# Patient Record
Sex: Male | Born: 1960
Health system: Southern US, Community
[De-identification: ages and names within clinical notes are randomized; demographics above are authoritative.]

## PROBLEM LIST (undated history)

## (undated) DIAGNOSIS — F419 Anxiety disorder, unspecified: Secondary | ICD-10-CM

## (undated) DIAGNOSIS — J45909 Unspecified asthma, uncomplicated: Secondary | ICD-10-CM

## (undated) DIAGNOSIS — Z9289 Personal history of other medical treatment: Secondary | ICD-10-CM

## (undated) DIAGNOSIS — IMO0002 Reserved for concepts with insufficient information to code with codable children: Secondary | ICD-10-CM

## (undated) DIAGNOSIS — I10 Essential (primary) hypertension: Secondary | ICD-10-CM

## (undated) DIAGNOSIS — K219 Gastro-esophageal reflux disease without esophagitis: Secondary | ICD-10-CM

## (undated) DIAGNOSIS — R569 Unspecified convulsions: Secondary | ICD-10-CM

## (undated) DIAGNOSIS — T783XXA Angioneurotic edema, initial encounter: Secondary | ICD-10-CM

## (undated) HISTORY — DX: Unspecified convulsions: R56.9

## (undated) HISTORY — DX: Unspecified asthma, uncomplicated: J45.909

## (undated) HISTORY — DX: Essential (primary) hypertension: I10

## (undated) HISTORY — DX: Angioneurotic edema, initial encounter: T78.3XXA

## (undated) HISTORY — PX: OTHER SURGICAL HISTORY: SHX169

---

## 1997-10-27 ENCOUNTER — Emergency Department (HOSPITAL_COMMUNITY): Admission: AD | Admit: 1997-10-27 | Discharge: 1997-10-27 | Payer: Self-pay | Admitting: Emergency Medicine

## 2006-07-01 ENCOUNTER — Emergency Department (HOSPITAL_COMMUNITY): Admission: EM | Admit: 2006-07-01 | Discharge: 2006-07-01 | Payer: Self-pay | Admitting: Emergency Medicine

## 2008-02-07 ENCOUNTER — Ambulatory Visit: Payer: Self-pay | Admitting: Gastroenterology

## 2008-02-07 ENCOUNTER — Ambulatory Visit: Payer: Self-pay | Admitting: Pulmonary Disease

## 2008-02-07 ENCOUNTER — Inpatient Hospital Stay (HOSPITAL_COMMUNITY): Admission: EM | Admit: 2008-02-07 | Discharge: 2008-02-09 | Payer: Self-pay | Admitting: Emergency Medicine

## 2008-02-08 ENCOUNTER — Encounter: Payer: Self-pay | Admitting: Gastroenterology

## 2008-04-28 ENCOUNTER — Ambulatory Visit: Payer: Self-pay | Admitting: Nurse Practitioner

## 2008-04-28 DIAGNOSIS — K029 Dental caries, unspecified: Secondary | ICD-10-CM

## 2008-04-28 DIAGNOSIS — K219 Gastro-esophageal reflux disease without esophagitis: Secondary | ICD-10-CM | POA: Insufficient documentation

## 2008-04-28 DIAGNOSIS — F1910 Other psychoactive substance abuse, uncomplicated: Secondary | ICD-10-CM | POA: Insufficient documentation

## 2008-04-28 DIAGNOSIS — D649 Anemia, unspecified: Secondary | ICD-10-CM | POA: Insufficient documentation

## 2008-04-28 DIAGNOSIS — F1010 Alcohol abuse, uncomplicated: Secondary | ICD-10-CM | POA: Insufficient documentation

## 2008-04-28 DIAGNOSIS — F17200 Nicotine dependence, unspecified, uncomplicated: Secondary | ICD-10-CM | POA: Insufficient documentation

## 2008-04-28 DIAGNOSIS — K279 Peptic ulcer, site unspecified, unspecified as acute or chronic, without hemorrhage or perforation: Secondary | ICD-10-CM | POA: Insufficient documentation

## 2008-05-02 LAB — CONVERTED CEMR LAB
Eosinophils Absolute: 0.1 10*3/uL (ref 0.0–0.7)
HCT: 40.5 % (ref 39.0–52.0)
Hemoglobin: 12.4 g/dL — ABNORMAL LOW (ref 13.0–17.0)
Lymphocytes Relative: 34 % (ref 12–46)
Lymphs Abs: 1.2 10*3/uL (ref 0.7–4.0)
MCV: 76.9 fL — ABNORMAL LOW (ref 78.0–100.0)
Monocytes Relative: 14 % — ABNORMAL HIGH (ref 3–12)
Neutrophils Relative %: 49 % (ref 43–77)
Platelets: 298 10*3/uL (ref 150–400)
RBC: 5.27 M/uL (ref 4.22–5.81)
Retic Ct Pct: 0.5 % (ref 0.4–3.1)

## 2008-06-15 ENCOUNTER — Ambulatory Visit: Payer: Self-pay | Admitting: *Deleted

## 2008-11-04 ENCOUNTER — Emergency Department (HOSPITAL_COMMUNITY): Admission: EM | Admit: 2008-11-04 | Discharge: 2008-11-05 | Payer: Self-pay | Admitting: Emergency Medicine

## 2010-01-02 ENCOUNTER — Emergency Department (HOSPITAL_COMMUNITY): Admission: EM | Admit: 2010-01-02 | Discharge: 2010-01-02 | Payer: Self-pay | Admitting: Emergency Medicine

## 2010-10-23 NOTE — Discharge Summary (Signed)
Casey Warren, Casey Warren                ACCOUNT NO.:  [id number]   MEDICAL RECORD NO.:  [id number]          PATIENT TYPE:  INP   LOCATION:  3005                         FACILITY:  [facility]   PHYSICIAN:  Renee Ramus, MD       DATE OF BIRTH:  03/23/1961   DATE OF ADMISSION:  02/06/2008  DATE OF DISCHARGE:  02/09/2008                               DISCHARGE SUMMARY   PRIMARY DIAGNOSIS:  Gastrointestinal bleed.   SECONDARY DIAGNOSES:  1. Profound anemia.  2. Helicobacter pylori positive.  3. Polysubstance abuse.   HOSPITAL COURSE:  1. GI bleed.  The patient is a 50 year old African American male who      was admitted secondary to increased weakness.  The patient was      found to be profoundly anemic.  He was admitted and placed in      intensive care unit.  There, he received an urgent consult from GI      who performed an EGD showing gastropathy.  The patient did undergo      biopsy, which tested positive on a CLO test.  The patient has been      diagnosed with Helicobacter pylori infection and started a 7-day      treatment regimen.  2. Anemia.  The patient is anemic from GI bleed.  Iron studies are      currently pending, so we have started him empirically on p.o. iron      as part of his recovery.  The patient was transfused 4 units packed      RBCs with good response and his hemoglobins remained stable.  3. History of  substance abuse.  The patient did test positive on tox      screen for cocaine, but he has had no problems with withdrawal      during this hospitalization.   LABORATORY DATA:  Of note:  1. Initial hemoglobin of 3.4 with hematocrit of 10.0, status post      transfusion of 4 units packed RBCs brought his hemoglobin of 9.1      and hematocrit of 26.6.  This was maintained throughout this      hospitalization and on discharge his hemoglobin was 9.0 and      hematocrit of 26.8 within normal a MCV of 90.8.  2. INR 1.3 with elevated protime of 16.3.  3. Hypoalbuminemia  with an albumin of 2.3.  4. Tox screen positive for cocaine.  5. UA showing no evidence of infection.  6. Helicobacter pylori screen positive on CLO test.   IMAGING:  1. Portable chest showing no acute disease.  2. EGD showing evidence of gastric ulcer and gastritis along with      duodenitis without hemorrhage.   MEDICATIONS ON DISCHARGE:  1. Clindamycin 500 mg 1 p.o. b.i.d. x7 days.  2. Amoxicillin 1 g p.o. b.i.d. x7 days.  3. Protonix 40 mg p.o. b.i.d. x7 days.  4. Iron sulfate 325 mg p.o. t.i.d. x1 month.   There are no labs or studies pending at the time of discharge.  The  patient is in  stable condition and is anxious for discharge.  Time spent  35 minutes.      Renee Ramus, MD  Electronically Signed     JF/MEDQ  D:  02/09/2008  T:  02/10/2008  Job:  [id number]

## 2011-03-13 LAB — COMPREHENSIVE METABOLIC PANEL
AST: 15
Alkaline Phosphatase: 71
Calcium: 7.7 — ABNORMAL LOW
Creatinine, Ser: 1.03
Total Bilirubin: 0.5
Total Protein: 4.2 — ABNORMAL LOW

## 2011-03-13 LAB — RETICULOCYTES
RBC.: 3.09 — ABNORMAL LOW
Retic Count, Absolute: 163.8
Retic Ct Pct: 5.3 — ABNORMAL HIGH

## 2011-03-13 LAB — URINALYSIS, ROUTINE W REFLEX MICROSCOPIC
Bilirubin Urine: NEGATIVE
Nitrite: NEGATIVE
Specific Gravity, Urine: 1.016

## 2011-03-13 LAB — CBC
Hemoglobin: 8.8 — ABNORMAL LOW
MCHC: 33.7
MCV: 91.6
WBC: 7.6
WBC: 9

## 2011-03-13 LAB — FERRITIN: Ferritin: 44 (ref 22–322)

## 2011-03-13 LAB — IRON AND TIBC: UIBC: 209

## 2011-11-10 ENCOUNTER — Emergency Department (HOSPITAL_COMMUNITY): Payer: Self-pay

## 2011-11-10 ENCOUNTER — Encounter (HOSPITAL_COMMUNITY): Payer: Self-pay | Admitting: Emergency Medicine

## 2011-11-10 ENCOUNTER — Emergency Department (HOSPITAL_COMMUNITY)
Admission: EM | Admit: 2011-11-10 | Discharge: 2011-11-10 | Disposition: A | Discharge status: Home / Self Care | Payer: Self-pay | Attending: Emergency Medicine | Admitting: Emergency Medicine

## 2011-11-10 DIAGNOSIS — G319 Degenerative disease of nervous system, unspecified: Secondary | ICD-10-CM | POA: Insufficient documentation

## 2011-11-10 DIAGNOSIS — R220 Localized swelling, mass and lump, head: Secondary | ICD-10-CM | POA: Insufficient documentation

## 2011-11-10 DIAGNOSIS — M542 Cervicalgia: Secondary | ICD-10-CM | POA: Insufficient documentation

## 2011-11-10 DIAGNOSIS — Y92009 Unspecified place in unspecified non-institutional (private) residence as the place of occurrence of the external cause: Secondary | ICD-10-CM | POA: Insufficient documentation

## 2011-11-10 DIAGNOSIS — J438 Other emphysema: Secondary | ICD-10-CM | POA: Insufficient documentation

## 2011-11-10 DIAGNOSIS — S0292XA Unspecified fracture of facial bones, initial encounter for closed fracture: Secondary | ICD-10-CM

## 2011-11-10 DIAGNOSIS — S0280XA Fracture of other specified skull and facial bones, unspecified side, initial encounter for closed fracture: Secondary | ICD-10-CM | POA: Insufficient documentation

## 2011-11-10 LAB — COMPREHENSIVE METABOLIC PANEL
BUN: 11 mg/dL (ref 6–23)
CO2: 21 mEq/L (ref 19–32)
Calcium: 8.9 mg/dL (ref 8.4–10.5)
Chloride: 104 mEq/L (ref 96–112)
Creatinine, Ser: 0.82 mg/dL (ref 0.50–1.35)
Glucose, Bld: 99 mg/dL (ref 70–99)
Potassium: 4.1 mEq/L (ref 3.5–5.1)
Sodium: 138 mEq/L (ref 135–145)
Total Bilirubin: 0.2 mg/dL — ABNORMAL LOW (ref 0.3–1.2)
Total Protein: 7.5 g/dL (ref 6.0–8.3)

## 2011-11-10 LAB — DIFFERENTIAL
Basophils Absolute: 0 10*3/uL (ref 0.0–0.1)
Eosinophils Absolute: 0 10*3/uL (ref 0.0–0.7)
Lymphocytes Relative: 20 % (ref 12–46)
Monocytes Absolute: 0.5 10*3/uL (ref 0.1–1.0)
Monocytes Relative: 7 % (ref 3–12)
Neutro Abs: 5.2 10*3/uL (ref 1.7–7.7)
Neutrophils Relative %: 73 % (ref 43–77)

## 2011-11-10 LAB — CBC
MCH: 31.7 pg (ref 26.0–34.0)
MCHC: 35.5 g/dL (ref 30.0–36.0)
Platelets: 237 10*3/uL (ref 150–400)
RDW: 13.6 % (ref 11.5–15.5)
WBC: 7.1 10*3/uL (ref 4.0–10.5)

## 2011-11-10 MED ORDER — OXYCODONE-ACETAMINOPHEN 5-325 MG PO TABS
1.0000 | ORAL_TABLET | Freq: Once | ORAL | Status: AC
  Administered 2011-11-10: 1 via ORAL
  Filled 2011-11-10: qty 1

## 2011-11-10 MED ORDER — MORPHINE SULFATE 4 MG/ML IJ SOLN: 4.0000 mg | Freq: Once | INTRAMUSCULAR | Status: DC

## 2011-11-10 MED ORDER — HYDROCODONE-ACETAMINOPHEN 5-325 MG PO TABS
1.0000 | ORAL_TABLET | Freq: Once | ORAL | Status: AC
  Administered 2011-11-10: 1 via ORAL
  Filled 2011-11-10: qty 1

## 2011-11-10 MED ORDER — OXYCODONE-ACETAMINOPHEN 5-325 MG PO TABS: 2.0000 | ORAL_TABLET | ORAL | Status: AC | PRN

## 2011-11-10 MED ORDER — ONDANSETRON HCL 4 MG/2ML IJ SOLN: 4.0000 mg | Freq: Once | INTRAMUSCULAR | Status: DC

## 2011-11-10 MED ORDER — SULFAMETHOXAZOLE-TRIMETHOPRIM 800-160 MG PO TABS: 1.0000 | ORAL_TABLET | Freq: Two times a day (BID) | ORAL | Status: AC
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
## 2011-11-10 NOTE — Discharge Instructions (Signed)
Your CT scan showed that you have multiple broken bones around your right eye, to your nose, and to your left eye. It is very, very important that you follow up with ENT and ophthalmology (eye doctor) tomorrow in the office. Please call in the morning to make an appointment. Apply ice to your face during the day today to help with swelling. Take ALL of the antibiotic as prescribed. Use the pain medication as needed - do not drink alcohol while taking this. If you develop trouble opening your mouth, trouble moving your eye, or any other worrisome symptoms, return to the ED.  RESOURCE GUIDE  Chronic Pain Problems: Contact [facility]  [phone] Patients need to be referred by their primary care doctor.  Insufficient Money for Medicine: Contact United Way:  call "211" or [facility] [phone].  No Primary Care Doctor: - Call [facility]  [phone] - can help you locate a primary care doctor that  accepts your insurance, provides certain services, etc. - Physician Referral Service- [phone]  Agencies that provide inexpensive medical care: - [facility]  [phone] - [facility]  [phone] - [facility]  [phone] - Women's Clinic  [phone] - Planned Parenthood  [phone] [facility]  [phone]  Medicaid-accepting [facility] Providers: - [facility]- [address]  [phone], Mon-Fri 9am-7pm, Sat 9am-1pm - [facility]- [address] [place], [address] [place]  [phone] - [facility]- [address], [address] [place]  [phone] [facility]- [address]  [phone] - Renaye Rakers- [address] [place], [address], [phone]  Only accepts [place] patients after they have their name  applied to their card  Self Pay (no insurance) in [place]: - Sickle Cell Patients: Dr Willey Blade, [facility]  Internal Medicine  [address] [place], [phone] - [facility]- [address] [place]  [phone]       [facility]- [address]       -     [facility]- see information above (Speak to [employer] if you do not have insurance)       -  [facility]- [address] [place], [phone]       -  [facility]- [address],  [phone]       -  [facility]- [address] [phone]       -  Dr Julio Sicks-  [address], [place], [phone]       -  [facility]- [address] [phone]       -  [facility]- [address], [phone], also [address] [phone]       -    [facility]- [address] [place], [phone], 1st & 3rd Saturday   every month, 10am-1pm  1) Find a Doctor and Pay Out of Pocket Although you won't have to find out who is covered by your insurance plan, it is a good idea to ask around and get recommendations. You will then need to call the office and see if the doctor you have chosen will accept you as a new patient and what types of options they offer for patients who are self-pay.  Some doctors offer discounts or will set up payment plans for their patients who do not have insurance, but you will need to ask so you aren't surprised when you get to your appointment.  2) Contact Your Local Health Department Not all health departments have doctors that can see patients for sick visits, but many do, so it is worth a call to see if yours does. If you don't know where your local health department is, you can check in your phone book. The CDC also has a tool to help you locate your state's health department, and many state websites also have listings of all of their local health departments.  3) Find a Walk-in Clinic If your illness is not likely to be very severe or complicated, you may want to try a walk in clinic. These are popping up all  over the country in pharmacies, drugstores, and shopping centers. They're usually staffed by nurse practitioners or physician assistants that have been trained to treat common illnesses and complaints. They're usually fairly quick and inexpensive. However, if you have serious medical issues or chronic medical problems, these are probably not your best option  STD Testing - [facility] Department of [facility], STD Clinic, [address] [place], phone [phone] or [phone].  Monday - Friday, call for an appointment. [facility] Department of [employer], STD Clinic, [address], [place], phone [phone] or [phone].  Monday - Friday, call for an appointment.  Abuse/Neglect: [facility] Child Abuse Hotline (901)071-9467 [facility] Child Abuse Hotline 403-560-5304 (After Hours)  Emergency Shelter:  [facility] 561-560-9643  Maternity Homes: - [facility] at [facility] [phone] - [facility] [phone]  MRSA Hotline #:   206-314-9712  [facility] Resources  [facility] [place]  [employer] [facility] Dept. [address]                 [address]         [address] [place] [address]  Blondell Reveal Phone:  [phone]                                  Phone:  [phone]                   Phone:  [phone]  [facility], [phone] - [phone]       -     [facility] in [place], [address]                                  [phone], Insurance  [place] Child Abuse Hotline 402-565-2403 or 712-669-6752 (After Hours)   Behavioral Health Services  Substance Abuse Resources: - Alcohol and Drug Services  (336)688-5839 - Addiction  [facility] [phone] - [facility] [phone] Floydene Flock [phone] - Residential & Outpatient Substance Abuse Program  [phone]  Psychological Services: [facility]  8202993524 Services  [phone] - [facility], [phone] [place]. [address], [place], ACCESS LINE: 7187538306 or 709-660-8507, [employer]  Dental Assistance  If unable to pay or uninsured, contact:  [facility] or [facility]. to become qualified for the adult dental clinic.  Patients with Medicaid: [facility] [phone], [phone] [address] [phone]  If unable to pay, or uninsured, contact [facility] [phone]) or [facility] Department [phone] in [place], [phone] in [facility]) to become qualified for the adult dental clinic  Other Low-Cost Community Dental Services: - [facility]- [address] [place], [place], [place], [address], [phone], [phone], 2nd and 4th Thursday of the month at 6:30am.  10 clients each day by appointment, can sometimes see walk-in patients if someone does not show for an appointment. [facility]- [address] [place], [place], [address], [phone] - [facility]- [address], [name], [place], [address], [phone] [facility] Health Department- [phone] [facility] Department- [phone] [facility] [phone]    Facial Fracture A facial fracture is a break in one of the bones of your face. HOME CARE INSTRUCTIONS   Protect the injured part of your face until it is healed.   Do not participate in activities which give chance for re-injury until your doctor approves.   Gently wash and dry your face.   Wear head and facial protection while riding a bicycle, motorcycle, or snowmobile.  SEEK MEDICAL CARE IF:   An oral temperature  above 102 F (38.9 C) develops.   You have severe headaches or notice changes in your vision.   You have new numbness or tingling in your face.   You develop nausea (feeling sick to your stomach), vomiting or a stiff neck.  SEEK IMMEDIATE MEDICAL CARE IF:   You develop difficulty seeing or experience double vision.   You become dizzy, lightheaded, or faint.   You develop trouble speaking, breathing, or swallowing.   You have a watery discharge from your nose or ear.  MAKE SURE YOU:   Understand these instructions.   Will watch your condition.   Will get help right away if you are not doing well or get worse.  Document Released: 05/27/2005 Document Revised: 05/16/2011 Document Reviewed: 01/14/2008 [facility] Patient Information 2012 [place], [place].
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
## 2011-11-11 NOTE — ED Provider Notes (Signed)
See prior note   Casey Bobby L Reinette Cuneo, MD 11/11/11 0508 

## 2011-11-14 ENCOUNTER — Encounter (HOSPITAL_COMMUNITY): Payer: Self-pay | Admitting: Pharmacy Technician

## 2011-11-18 ENCOUNTER — Encounter (HOSPITAL_COMMUNITY): Payer: Self-pay | Admitting: *Deleted
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
## 2011-11-19 ENCOUNTER — Ambulatory Visit (HOSPITAL_COMMUNITY): Payer: Self-pay | Admitting: Anesthesiology

## 2011-11-19 ENCOUNTER — Encounter (HOSPITAL_COMMUNITY): Payer: Self-pay | Admitting: *Deleted

## 2011-11-19 ENCOUNTER — Encounter (HOSPITAL_COMMUNITY): Payer: Self-pay | Admitting: Anesthesiology

## 2011-11-19 ENCOUNTER — Ambulatory Visit (HOSPITAL_COMMUNITY)
Admission: RE | Admit: 2011-11-19 | Discharge: 2011-11-19 | Disposition: A | Discharge status: Home / Self Care | Payer: Self-pay | Source: Ambulatory Visit | Attending: Otolaryngology | Admitting: Otolaryngology

## 2011-11-19 ENCOUNTER — Encounter (HOSPITAL_COMMUNITY)
Admission: RE | Disposition: A | Discharge status: Home / Self Care | Payer: Self-pay | Source: Ambulatory Visit | Attending: Otolaryngology

## 2011-11-19 DIAGNOSIS — K219 Gastro-esophageal reflux disease without esophagitis: Secondary | ICD-10-CM | POA: Insufficient documentation

## 2011-11-19 DIAGNOSIS — F17200 Nicotine dependence, unspecified, uncomplicated: Secondary | ICD-10-CM | POA: Insufficient documentation

## 2011-11-19 DIAGNOSIS — Z8711 Personal history of peptic ulcer disease: Secondary | ICD-10-CM | POA: Insufficient documentation

## 2011-11-19 DIAGNOSIS — S022XXA Fracture of nasal bones, initial encounter for closed fracture: Secondary | ICD-10-CM | POA: Insufficient documentation

## 2011-11-19 HISTORY — DX: Gastro-esophageal reflux disease without esophagitis: K21.9

## 2011-11-19 HISTORY — DX: Reserved for concepts with insufficient information to code with codable children: IMO0002

## 2011-11-19 HISTORY — DX: Personal history of other medical treatment: Z92.89

## 2011-11-19 HISTORY — DX: Anxiety disorder, unspecified: F41.9

## 2011-11-19 HISTORY — PX: CLOSED REDUCTION NASAL FRACTURE: SHX5365

## 2011-11-19 LAB — CBC
Hemoglobin: 14.1 g/dL (ref 13.0–17.0)
MCH: 32 pg (ref 26.0–34.0)
MCHC: 36.2 g/dL — ABNORMAL HIGH (ref 30.0–36.0)
MCV: 88.4 fL (ref 78.0–100.0)
Platelets: 254 10*3/uL (ref 150–400)
RBC: 4.41 MIL/uL (ref 4.22–5.81)

## 2011-11-19 SURGERY — CLOSED REDUCTION, FRACTURE, NASAL BONE
Anesthesia: General | Site: Nose | Wound class: Clean Contaminated

## 2011-11-19 MED ORDER — NEOMYCIN-POLYMYXIN-HC 3.5-10000-1 OT SOLN
3.0000 [drp] | OTIC | Status: AC
  Administered 2011-11-19: 3 [drp] via OTIC
  Filled 2011-11-19: qty 10

## 2011-11-19 MED ORDER — ONDANSETRON HCL 4 MG/2ML IJ SOLN
INTRAMUSCULAR | Status: DC | PRN
  Administered 2011-11-19: 4 mg via INTRAVENOUS

## 2011-11-19 MED ORDER — OXYCODONE-ACETAMINOPHEN 5-325 MG PO TABS
1.0000 | ORAL_TABLET | ORAL | Status: DC | PRN
  Administered 2011-11-19: 2 via ORAL

## 2011-11-19 MED ORDER — LABETALOL HCL 5 MG/ML IV SOLN
INTRAVENOUS | Status: AC
  Filled 2011-11-19: qty 4

## 2011-11-19 MED ORDER — MIDAZOLAM HCL 5 MG/5ML IJ SOLN
INTRAMUSCULAR | Status: DC | PRN
  Administered 2011-11-19: 2 mg via INTRAVENOUS

## 2011-11-19 MED ORDER — HYDROMORPHONE HCL PF 1 MG/ML IJ SOLN
INTRAMUSCULAR | Status: AC
  Filled 2011-11-19: qty 1

## 2011-11-19 MED ORDER — FENTANYL CITRATE 0.05 MG/ML IJ SOLN
INTRAMUSCULAR | Status: DC | PRN
  Administered 2011-11-19 (×5): 50 ug via INTRAVENOUS

## 2011-11-19 MED ORDER — LIDOCAINE HCL (CARDIAC) 20 MG/ML IV SOLN
INTRAVENOUS | Status: DC | PRN
  Administered 2011-11-19: 60 mg via INTRAVENOUS

## 2011-11-19 MED ORDER — LABETALOL HCL 5 MG/ML IV SOLN
INTRAVENOUS | Status: DC | PRN
  Administered 2011-11-19: 5 mg via INTRAVENOUS

## 2011-11-19 MED ORDER — OXYCODONE-ACETAMINOPHEN 5-325 MG PO TABS: 1.0000 | ORAL_TABLET | ORAL | Status: DC | PRN

## 2011-11-19 MED ORDER — OXYCODONE-ACETAMINOPHEN 5-325 MG PO TABS
ORAL_TABLET | ORAL | Status: AC
  Filled 2011-11-19: qty 2

## 2011-11-19 MED ORDER — SULFAMETHOXAZOLE-TMP DS 800-160 MG PO TABS: 1.0000 | ORAL_TABLET | Freq: Two times a day (BID) | ORAL | Status: DC

## 2011-11-19 MED ORDER — OXYMETAZOLINE HCL 0.05 % NA SOLN
NASAL | Status: DC | PRN
  Administered 2011-11-19: 2 via NASAL

## 2011-11-19 MED ORDER — HYDRALAZINE HCL 20 MG/ML IJ SOLN
5.0000 mg | INTRAMUSCULAR | Status: AC
  Administered 2011-11-19 (×3): 5 mg via INTRAVENOUS

## 2011-11-19 MED ORDER — LACTATED RINGERS IV SOLN
INTRAVENOUS | Status: DC | PRN
  Administered 2011-11-19: 09:00:00 via INTRAVENOUS

## 2011-11-19 MED ORDER — PROPOFOL 10 MG/ML IV EMUL
INTRAVENOUS | Status: DC | PRN
  Administered 2011-11-19: 200 mg via INTRAVENOUS

## 2011-11-19 MED ORDER — HYDROMORPHONE HCL PF 1 MG/ML IJ SOLN
0.2500 mg | INTRAMUSCULAR | Status: DC | PRN
  Administered 2011-11-19 (×3): 0.5 mg via INTRAVENOUS

## 2011-11-19 MED ORDER — LABETALOL HCL 5 MG/ML IV SOLN
5.0000 mg | INTRAVENOUS | Status: DC | PRN
  Administered 2011-11-19: 10 mg via INTRAVENOUS
  Administered 2011-11-19: 5 mg via INTRAVENOUS

## 2011-11-19 MED ORDER — HYDRALAZINE HCL 20 MG/ML IJ SOLN
INTRAMUSCULAR | Status: AC
  Administered 2011-11-19: 5 mg via INTRAVENOUS
  Filled 2011-11-19: qty 1

## 2011-11-19 SURGICAL SUPPLY — 29 items
CANISTER SUCTION 2500CC (MISCELLANEOUS) ×2 IMPLANT
CLOTH BEACON ORANGE TIMEOUT ST (SAFETY) ×2 IMPLANT
COVER MAYO STAND STRL (DRAPES) ×2 IMPLANT
COVER TABLE BACK 60X90 (DRAPES) ×2 IMPLANT
CRADLE DONUT ADULT HEAD (MISCELLANEOUS) IMPLANT
DRESSING NASAL KENNEDY 3.5X.9 (MISCELLANEOUS) ×2 IMPLANT
DRESSING NASAL POPE 10X1.5X2.5 (GAUZE/BANDAGES/DRESSINGS) ×2 IMPLANT
DRSG NASAL KENNEDY 3.5X.9 (MISCELLANEOUS)
DRSG NASAL POPE 10X1.5X2.5 (GAUZE/BANDAGES/DRESSINGS) ×2
GAUZE SPONGE 2X2 8PLY STRL LF (GAUZE/BANDAGES/DRESSINGS) ×1 IMPLANT
GAUZE SPONGE 4X4 16PLY XRAY LF (GAUZE/BANDAGES/DRESSINGS) ×2 IMPLANT
GLOVE BIO SURGEON STRL SZ 6.5 (GLOVE) ×2 IMPLANT
GLOVE BIOGEL PI IND STRL 7.0 (GLOVE) IMPLANT
GLOVE BIOGEL PI INDICATOR 7.0 (GLOVE) ×1
GLOVE ECLIPSE 8.0 STRL XLNG CF (GLOVE) ×1 IMPLANT
GOWN STRL NON-REIN LRG LVL3 (GOWN DISPOSABLE) ×2 IMPLANT
KIT BASIN OR (CUSTOM PROCEDURE TRAY) ×2 IMPLANT
KIT ROOM TURNOVER OR (KITS) ×2 IMPLANT
KIT SPLINT NASAL DENVER SM BEI (GAUZE/BANDAGES/DRESSINGS) ×2 IMPLANT
NDL HYPO 25GX1X1/2 BEV (NEEDLE) IMPLANT
NEEDLE HYPO 25GX1X1/2 BEV (NEEDLE) IMPLANT
NS IRRIG 1000ML POUR BTL (IV SOLUTION) ×2 IMPLANT
PAD ARMBOARD 7.5X6 YLW CONV (MISCELLANEOUS) ×4 IMPLANT
PATTIES SURGICAL .5 X3 (DISPOSABLE) IMPLANT
SPONGE GAUZE 2X2 STER 10/PKG (GAUZE/BANDAGES/DRESSINGS)
SYR CONTROL 10ML LL (SYRINGE) ×1 IMPLANT
TOWEL OR 17X24 6PK STRL BLUE (TOWEL DISPOSABLE) ×4 IMPLANT
TUBE CONNECTING 12X1/4 (SUCTIONS) ×2 IMPLANT
WATER STERILE IRR 1000ML POUR (IV SOLUTION) ×1 IMPLANT

## 2011-11-19 NOTE — Op Note (Signed)
11/19/2011  9:31 AM    Hervey Ard  [id number]   Pre-Op Dx:  Displaced nasal and nasal septal fracture  Post-op Dx: same  Proc: Closed reduction with stabilization, nasal and nasal septal fractures   Surg:  Flo Shanks T MD  Anes:  GOT  EBL:  25 ml  Comp:  none  Findings:  Depressed RIGHT nasal bone, readily mobilized.  Severe high RIGHTward septal fracture/displacement, with some probable pre morbid cartilage deformity  Procedure: With the patient in a comfortable supine position, general intravenous and LMA anesthesia was administered. At an appropriate level, the patient was placed in a semisitting position. The CT scans were reviewed. The nose was reexamined externally and internally with the findings as described above. A clean preparation and draping of the midface was accomplished. The patient had received Afrin nasal spray for decongestion and hemostasis in the holding area.  A blunt fracture elevator was measured to the level of the medial canthus and placed under the bony dorsum and with anterior and right lateral pressure, the comminuted fracture was mobilized and repositioned in a more anatomic position. The high perpendicular plate of the ethmoid was observed to be fractured, displaced, and was mobilized with an [name] for forceps.  It was elected to place packing in both sides of the nose and support the septum and the dorsum. Therefore Merocel packing was fashioned into an appropriate size, placed up under the dorsum of the nose to support the nose and septum on both sides, and moistened with Cortisporin otic suspension.  The external nose was cleaned with alcohol, painted with skin prep, and then Steri-Strips were applied in the standard fashion. A small/medium [place] splint was fashioned, placed on the nose, and compressed slightly for support.  There have been moderate bleeding throughout the procedure which was controlled with suction. At this point hemostasis was  observed. The pharynx was suctioned clear before returning the patient anesthesia. The patient was extubated and transferred to recovery in stable condition.  Comment: Relatively severe displaced nasal fracture including the nasal process of the frontal bone and the frontal process of the maxilla. Also severe rightward displaced high perpendicular plate septal fracture. Hence the indication for the several components of today's procedure.  Dispo:   PACU to home  Plan:  Ice, elevation, analgesia, antibiosis.  Plan to remove packs in 7 days, splint in 10 days.  Cephus Richer MD

## 2011-11-19 NOTE — Anesthesia Postprocedure Evaluation (Signed)
  Anesthesia Post-op Note  Patient: Casey Warren  Procedure(s) Performed: Procedure(s) (LRB): CLOSED REDUCTION NASAL FRACTURE (N/A)  Patient Location: PACU  Anesthesia Type: General  Level of Consciousness: awake and alert   Airway and Oxygen Therapy: Patient Spontanous Breathing  Post-op Pain: mild  Post-op Assessment: Post-op Vital signs reviewed, Patient's Cardiovascular Status Stable, Respiratory Function Stable, Patent Airway and No signs of Nausea or vomiting  Post-op Vital Signs: Reviewed and stable  Complications: No apparent anesthesia complications

## 2011-11-19 NOTE — Preoperative (Signed)
Beta Blockers   Reason not to administer Beta Blockers:Not Applicable 

## 2011-11-19 NOTE — Transfer of Care (Signed)
Immediate Anesthesia Transfer of Care Note  Patient: Casey Warren  Procedure(s) Performed: Procedure(s) (LRB): CLOSED REDUCTION NASAL FRACTURE (N/A)  Patient Location: PACU  Anesthesia Type: General  Level of Consciousness: awake, alert  and oriented  Airway & Oxygen Therapy: Patient Spontanous Breathing  Post-op Assessment: Report given to PACU RN and Post -op Vital signs reviewed and stable  Post vital signs: Reviewed  Complications: No apparent anesthesia complications

## 2011-11-19 NOTE — Discharge Instructions (Signed)
Facial Fracture A facial fracture is a break in one of the bones of your face. HOME CARE INSTRUCTIONS   Protect the injured part of your face until it is healed.   Do not participate in activities which give chance for re-injury until your doctor approves.   Gently wash and dry your face.   Wear head and facial protection while riding a bicycle, motorcycle, or snowmobile.  SEEK MEDICAL CARE IF:   An oral temperature above 102 F (38.9 C) develops.   You have severe headaches or notice changes in your vision.   You have new numbness or tingling in your face.   You develop nausea (feeling sick to your stomach), vomiting or a stiff neck.  SEEK IMMEDIATE MEDICAL CARE IF:   You develop difficulty seeing or experience double vision.   You become dizzy, lightheaded, or faint.   You develop trouble speaking, breathing, or swallowing.   You have a watery discharge from your nose or ear.  MAKE SURE YOU:   Understand these instructions.   Will watch your condition.   Will get help right away if you are not doing well or get worse.  Document Released: 05/27/2005 Document Revised: 05/16/2011 Document Reviewed: 01/14/2008 [facility] Patient Information 2012 [place], [place].  Keep head elevated 3-4 days. Ice pack x 24 hrs, longer as desired Drip pad as needed to catch weeping Rinse throat with cool dilute salt water to clear old blood and phlegm as often as desired Routine diet Keep splint dry If splint falls off before 7 days, tape it back on day and night.  After 7 days, at night only. Call for significant bleeding. Percocet for pain Bactrim DS to prevent infection

## 2011-11-19 NOTE — Anesthesia Preprocedure Evaluation (Addendum)
Anesthesia Evaluation  Patient identified by MRN, date of birth, ID band Patient awake    Reviewed: Allergy & Precautions, H&P , NPO status , Patient's Chart, lab work & pertinent test results  Airway Mallampati: II TM Distance: >3 FB Neck ROM: Full    Dental No notable dental hx. (+) Partial Lower, Partial Upper, Loose and Dental Advisory Given   Pulmonary neg pulmonary ROS, Current Smoker,  breath sounds clear to auscultation  Pulmonary exam normal       Cardiovascular negative cardio ROS  Rhythm:Regular Rate:Normal     Neuro/Psych ETOH - 2 tall beers 6/10 pm negative neurological ROS  negative psych ROS   GI/Hepatic Neg liver ROS, PUD, GERD-  Medicated and Controlled,  Endo/Other  negative endocrine ROS  Renal/GU negative Renal ROS  negative genitourinary   Musculoskeletal   Abdominal   Peds  Hematology negative hematology ROS (+)   Anesthesia Other Findings   Reproductive/Obstetrics negative OB ROS                          Anesthesia Physical Anesthesia Plan  ASA: II  Anesthesia Plan: General   Post-op Pain Management:    Induction: Intravenous  Airway Management Planned: LMA  Additional Equipment:   Intra-op Plan:   Post-operative Plan: Extubation in OR  Informed Consent: I have reviewed the patients History and Physical, chart, labs and discussed the procedure including the risks, benefits and alternatives for the proposed anesthesia with the patient or authorized representative who has indicated his/her understanding and acceptance.   Dental advisory given  Plan Discussed with: CRNA  Anesthesia Plan Comments:         Anesthesia Quick Evaluation

## 2011-11-19 NOTE — Interval H&P Note (Signed)
History and Physical Interval Note:  11/19/2011 8:23 AM  Casey Warren  has presented today for surgery, with the diagnosis of CLOSED DISPLACED NASAL/SEPTAL FRACTURES  The various methods of treatment have been discussed with the patient and family. After consideration of risks, benefits and other options for treatment, the patient has consented to  Procedure(s) (LRB): CLOSED REDUCTION NASAL FRACTURE (N/A) as a surgical intervention .  The patient's history has been re-reviewed, patient re-examined, no change in status, stable for surgery.  I have re-reviewed the patient's chart and labs.  Questions were answered to the patient's satisfaction.     Flo Shanks

## 2011-11-19 NOTE — Anesthesia Procedure Notes (Signed)
Procedure Name: LMA Insertion Date/Time: 11/19/2011 8:51 AM Performed by: Lovie Chol Pre-anesthesia Checklist: Patient identified, Emergency Drugs available, Suction available, Patient being monitored and Timeout performed Patient Re-evaluated:Patient Re-evaluated prior to inductionOxygen Delivery Method: Circle system utilized Preoxygenation: Pre-oxygenation with 100% oxygen Intubation Type: IV induction Ventilation: Mask ventilation without difficulty LMA: LMA inserted LMA Size: 4.0 Number of attempts: 1 Placement Confirmation: ETT inserted through vocal cords under direct vision,  positive ETCO2 and breath sounds checked- equal and bilateral Tube secured with: Tape Dental Injury: Teeth and Oropharynx as per pre-operative assessment

## 2011-11-21 ENCOUNTER — Encounter (HOSPITAL_COMMUNITY): Payer: Self-pay | Admitting: Otolaryngology

## 2011-11-24 ENCOUNTER — Encounter (HOSPITAL_COMMUNITY): Payer: Self-pay | Admitting: *Deleted

## 2011-11-24 ENCOUNTER — Emergency Department (HOSPITAL_COMMUNITY)
Admission: EM | Admit: 2011-11-24 | Discharge: 2011-11-24 | Disposition: A | Discharge status: Home / Self Care | Payer: Self-pay | Attending: Emergency Medicine | Admitting: Emergency Medicine

## 2011-11-24 DIAGNOSIS — Z4801 Encounter for change or removal of surgical wound dressing: Secondary | ICD-10-CM | POA: Insufficient documentation

## 2011-11-24 DIAGNOSIS — R0981 Nasal congestion: Secondary | ICD-10-CM

## 2011-11-24 DIAGNOSIS — F17200 Nicotine dependence, unspecified, uncomplicated: Secondary | ICD-10-CM | POA: Insufficient documentation

## 2011-11-24 NOTE — Discharge Instructions (Signed)
Saline Nose Drops  To help clear a stuffy nose, put salt water (saline) nose drops in your infant's nose. This helps to loosen the secretions in the nose. Use a bulb syringe to clean the nose out:  Before feeding.   Before putting your infant down for naps.   No more than once every 3 hours to avoid irritating your infant's nostrils.  HOME CARE  Buy nose drops at your local drug store. You can also make nose drops yourself. Mix 1 cup of water with  teaspoon of salt. Stir. Store this mixture at room temperature. Make a new batch daily.   To use the drops:   Put 1 or 2 drops in each side of infant's nose with a clean medicine dropper. Do not use this dropper for any other medicine.   Squeeze the air out of the suction bulb before inserting it into your infant's nose.   While still squeezing the bulb flat, place the tip of the bulb into a nostril. Let air come back into the bulb. The suction will pull snot out of the nose and into the bulb.   Repeat on other nostril.   Squeeze the bulb several times into a tissue and wash the bulb tip in soapy water. Store the bulb with the tip side down on paper towel.   Use the bulb syringe with only the saline drops to avoid irritating your infant's nostrils.  GET HELP RIGHT AWAY IF:  The snot changes to green or yellow.   The snot gets thicker.   Your infant is 3 months or younger with a rectal temperature of 100.4 F (38 C) or higher.   Your infant is older than 3 months with a rectal temperature of 102 F (38.9 C) or higher.   The stuffy nose lasts 10 days or longer.   There is trouble breathing or feeding.  MAKE SURE YOU:  Understand these instructions.   Will watch your infant's condition.   Will get help right away if your infant is not doing well or gets worse.  Document Released: 03/24/2009 Document Revised: 05/16/2011 Document Reviewed: 03/24/2009 [employer] Patient Information 2012 [employer]. 

## 2011-11-24 NOTE — ED Notes (Signed)
Pt reports having nose broke approx a week ago and having surgery, pt reports packing to nose is coming out and it is bothering him, is here to see if he can take the packing out.

## 2011-11-24 NOTE — ED Provider Notes (Addendum)
History  This chart was scribed for Casey Sprout, MD by Casey Warren. The patient was seen in room STRE8/STRE8. Patient's care was started at 1230.  CSN: [id number]  Arrival date & time 11/24/11  1230   First MD Initiated Contact with Patient 11/24/11 1347      Chief Complaint  Patient presents with  . Nose Problem    (Consider location/radiation/quality/duration/timing/severity/associated sxs/prior treatment) HPI  Casey Warren is a 51 y.o. male who presents to the Emergency Department 5 days after a closed reduction nasal fracture surgery for a nose fracture that occurred about 2 weeks ago. Pt complains of pain and difficulty breathing from the packing in his nose, which is beginning to come out. Pt states that he is in the ED to see if the packing can come out. Pt denies any complications from surgery.   Past Medical History  Diagnosis Date  . History of blood transfusion   . Ulcer   . Anxiety     related to surgery  . GERD (gastroesophageal reflux disease)     Past Surgical History  Procedure Date  . No surgical   . Closed reduction nasal fracture 11/19/2011    Procedure: CLOSED REDUCTION NASAL FRACTURE;  Surgeon: Casey Shanks, MD;  Location: [facility] OR;  Service: ENT;  Laterality: N/A;  CLOSED REDUCTION NASAL AND NASAL SEPTAL FRACTURE WITH STABILIZATION    History reviewed. No pertinent family history.  History  Substance Use Topics  . Smoking status: Current Everyday Smoker -- 0.5 packs/day  . Smokeless tobacco: Not on file  . Alcohol Use: Yes     Daily daughter unsure how muchj      Review of Systems  Constitutional: Negative for fever and chills.  HENT: Negative for nosebleeds and postnasal drip.        Packing of nose coming out  Respiratory: Negative for cough and shortness of breath.        Difficulty breathing through nose  Gastrointestinal: Negative for nausea and vomiting.  Skin: Negative for rash.  Neurological: Negative for weakness and  headaches.    Allergies  Review of patient's allergies indicates no known allergies.  Home Medications   Current Outpatient Rx  Name Route Sig Dispense Refill  . FLUTICASONE PROPIONATE 50 MCG/ACT NA SUSP Nasal Place 2 sprays into the nose daily.    Marland Kitchen LORATADINE 10 MG PO TABS Oral Take 10 mg by mouth daily.    . OXYCODONE-ACETAMINOPHEN 5-325 MG PO TABS Oral Take 1-2 tablets by mouth every 4 (four) hours as needed. For pain.    . SULFAMETHOXAZOLE-TMP DS 800-160 MG PO TABS Oral Take 1 tablet by mouth 2 (two) times daily.      BP 161/107  Pulse 104  Temp 98.4 F (36.9 C) (Oral)  Resp 18  SpO2 98%  Physical Exam  Nursing note and vitals reviewed. Constitutional: He is oriented to person, place, and time. He appears well-developed and well-nourished. No distress.  HENT:  Head: Normocephalic and atraumatic.       Diffuse nasal bridge swelling, packing in left nare that is starting to come out. Deep packing in right nare. No blood in the posterior pharynx.   Eyes: EOM are normal.  Neck: Neck supple. No tracheal deviation present.  Musculoskeletal: Normal range of motion.  Neurological: He is alert and oriented to person, place, and time.  Skin: Skin is warm and dry.  Psychiatric: He has a normal mood and affect. His behavior is normal.    ED  Course  Procedures (including critical care time)  DIAGNOSTIC STUDIES: Oxygen Saturation is 98% on room air, normal by my interpretation.    COORDINATION OF CARE: 2:06PM - Will review surgical notes and remove packing to left nare. Pt agrees with plan.    Labs Reviewed - No data to display No results found.   1. Nasal congestion       MDM   Patient with recent closed reduction of a nasal fracture 5 days ago with bilateral packing. He is here today because the left-sided packing is coming out and he states he cannot breathe. Visible left packing almost out of the nose on the left. The right there is deep packing present. The  packing was removed and patient feels much better. He will continue with his followup this week with Dr. Lazarus Warren      I personally performed the services described in this documentation, which was scribed in my presence.  The recorded information has been reviewed and considered.    Casey Sprout, MD 11/24/11 1420  Casey Sprout, MD 11/24/11 1421

## 2016-07-08 ENCOUNTER — Encounter (HOSPITAL_COMMUNITY): Payer: Self-pay

## 2016-07-08 ENCOUNTER — Emergency Department (HOSPITAL_COMMUNITY): Payer: Self-pay

## 2016-07-08 ENCOUNTER — Emergency Department (HOSPITAL_COMMUNITY)
Admission: EM | Admit: 2016-07-08 | Discharge: 2016-07-09 | Disposition: A | Discharge status: Home / Self Care | Payer: Self-pay | Attending: Emergency Medicine | Admitting: Emergency Medicine

## 2016-07-08 DIAGNOSIS — H539 Unspecified visual disturbance: Secondary | ICD-10-CM | POA: Insufficient documentation

## 2016-07-08 DIAGNOSIS — Z7982 Long term (current) use of aspirin: Secondary | ICD-10-CM | POA: Insufficient documentation

## 2016-07-08 DIAGNOSIS — R51 Headache: Secondary | ICD-10-CM | POA: Insufficient documentation

## 2016-07-08 DIAGNOSIS — I10 Essential (primary) hypertension: Secondary | ICD-10-CM | POA: Insufficient documentation

## 2016-07-08 DIAGNOSIS — F17200 Nicotine dependence, unspecified, uncomplicated: Secondary | ICD-10-CM | POA: Insufficient documentation

## 2016-07-08 LAB — BASIC METABOLIC PANEL
ANION GAP: 10 (ref 5–15)
BUN: 5 mg/dL — ABNORMAL LOW (ref 6–20)
CHLORIDE: 101 mmol/L (ref 101–111)
CO2: 25 mmol/L (ref 22–32)
CREATININE: 1.05 mg/dL (ref 0.61–1.24)
Calcium: 9.3 mg/dL (ref 8.9–10.3)
GFR calc non Af Amer: >60 mL/min (ref >60)
Glucose, Bld: 138 mg/dL — ABNORMAL HIGH (ref 65–99)
POTASSIUM: 4.2 mmol/L (ref 3.5–5.1)
Sodium: 136 mmol/L (ref 135–145)

## 2016-07-08 LAB — CBC
HCT: 47.1 % (ref 39.0–52.0)
HEMOGLOBIN: 17 g/dL (ref 13.0–17.0)
MCH: 31 pg (ref 26.0–34.0)
MCHC: 36.1 g/dL — ABNORMAL HIGH (ref 30.0–36.0)
MCV: 85.9 fL (ref 78.0–100.0)
Platelets: 316 10*3/uL (ref 150–400)
RBC: 5.48 MIL/uL (ref 4.22–5.81)
RDW: 13.3 % (ref 11.5–15.5)
WBC: 3.1 10*3/uL — AB (ref 4.0–10.5)

## 2016-07-08 MED ORDER — OXYCODONE-ACETAMINOPHEN 5-325 MG PO TABS
ORAL_TABLET | ORAL | Status: AC
  Filled 2016-07-08: qty 1

## 2016-07-08 MED ORDER — OXYCODONE-ACETAMINOPHEN 5-325 MG PO TABS
1.0000 | ORAL_TABLET | Freq: Once | ORAL | Status: AC
  Administered 2016-07-08: 1 via ORAL

## 2016-07-08 NOTE — ED Triage Notes (Signed)
Patient here with frontal headache and posterior head pain intermittent for 1 week with increasing BP. States that he developed blurred vision and double vision early am. On arrival states seeing double. Not diagnosed with HTN

## 2016-07-08 NOTE — ED Provider Notes (Signed)
[facility]-EMERGENCY DEPT Provider Note   CSN: [id number] Arrival date & time: 07/08/16  1609  By signing my name below, I, Casey Warren, attest that this documentation has been prepared under the direction and in the presence of Casey Baton, MD.  Electronically Signed: [name], ED Scribe. 07/09/16. 12:16 AM.    History   Chief Complaint Chief Complaint  Patient presents with  . headache/hypertension   The history is provided by the patient. No language interpreter was used.   HPI Comments: Casey Warren is a 56 y.o. male who presents to the Emergency Department complaining intermittent bilateral visual disturbance that began this morning. Relative says pt has also been complaining waxing and waning frontal headaches for the past 1.5 weeks. He describes the pain as a throbbing sensation and rates it a current 9/10. Pt says this morning he "completely saw black" for a few hours. He notes shortly after having intermittent double vision throughout the day. Pt expresses that his blood pressure has been higher than usual today. Relative notes pt's BP was 240/147 at its highest today, which is abnormal. He has not hx of HTN in the past and is not on any HTN medications. He denies difficulty swallowing, leg pain, and weakness.  Past Medical History:  Diagnosis Date  . 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 GERD (gastroesophageal reflux disease)   . History of blood transfusion   . Ulcer [facility])     Patient Active Problem List   Diagnosis Date Noted  . ANEMIA 04/28/2008  . ALCOHOL ABUSE 04/28/2008  . TOBACCO ABUSE 04/28/2008  . SUBSTANCE ABUSE, MULTIPLE 04/28/2008  . DENTAL CARIES 04/28/2008  . GERD 04/28/2008  . PEPTIC ULCER DISEASE, HELICOBACTER PYLORI POSITIVE 04/28/2008    Past Surgical History:  Procedure Laterality Date  . CLOSED REDUCTION NASAL FRACTURE  11/19/2011   Procedure: CLOSED REDUCTION NASAL FRACTURE;  Surgeon: Flo Shanks, MD;  Location: [facility] OR;  Service: ENT;   Laterality: N/A;  CLOSED REDUCTION NASAL AND NASAL SEPTAL FRACTURE WITH STABILIZATION  . No surgical         Home Medications    Prior to Admission medications   Medication Sig Start Date End Date Taking? Authorizing Provider  aspirin 81 MG chewable tablet Chew 4 tablets (324 mg total) by mouth daily. 07/09/16   Casey Baton, MD  hydrochlorothiazide (HYDRODIURIL) 25 MG tablet Take 1 tablet (25 mg total) by mouth daily. 07/09/16   Casey Baton, MD  lisinopril (PRINIVIL,ZESTRIL) 10 MG tablet Take 1 tablet (10 mg total) by mouth daily. 07/09/16   Casey Baton, MD    Family History No family history on file.  Social History Social History  Substance Use Topics  . Smoking status: Current Every Day Smoker    Packs/day: 0.50  . Smokeless tobacco: Not on file  . Alcohol use Yes     Comment: Daily daughter unsure how muchj     Allergies   Patient has no known allergies.   Review of Systems Review of Systems  HENT: Negative for trouble swallowing.   Eyes: Positive for visual disturbance.  Musculoskeletal: Positive for neck pain.  Neurological: Positive for headaches. Negative for syncope and weakness.  All other systems reviewed and are negative.    Physical Exam Updated Vital Signs BP (!) 162/107   Pulse 76   Temp 98 F (36.7 C) (Oral)   Resp 18   Ht 5' 3.5" (1.613 m)   Wt 159 lb (72.1 kg)  SpO2 96%   BMI 27.72 kg/m   Physical Exam  Constitutional: He is oriented to person, place, and time. He appears well-developed and well-nourished. No distress.  HENT:  Head: Normocephalic and atraumatic.  Eyes: Pupils are equal, round, and reactive to light.  Cardiovascular: Normal rate, regular rhythm and normal heart sounds.   No murmur heard. Pulmonary/Chest: Effort normal and breath sounds normal. No respiratory distress. He has no wheezes.  Abdominal: Soft. Bowel sounds are normal. There is no tenderness. There is no rebound.  Neurological: He is alert  and oriented to person, place, and time.  Cranial nerves II through XII intact, visual fields intact, 5 out of 5 strength in all 4 tremors, no dysmetria to finger nose finger, no drift  Skin: Skin is warm and dry.  Psychiatric: He has a normal mood and affect.  Nursing note and vitals reviewed.    ED Treatments / Results  DIAGNOSTIC STUDIES: Oxygen Saturation is 98% on RA, normal by my interpretation.  COORDINATION OF CARE:  12:14 AM Discussed treatment plan with pt at bedside and pt agreed to plan.  Labs (all labs ordered are listed, but only abnormal results are displayed) Labs Reviewed  CBC - Abnormal; Notable for the following:       Result Value   WBC 3.1 (*)    MCHC 36.1 (*)    All other components within normal limits  BASIC METABOLIC PANEL - Abnormal; Notable for the following:    Glucose, Bld 138 (*)    BUN 5 (*)    All other components within normal limits    EKG  EKG Interpretation None       Radiology Ct Head Wo Contrast  Result Date: 07/08/2016 CLINICAL DATA:  Headache. Hypertension. Also patient has double vision. EXAM: CT HEAD WITHOUT CONTRAST TECHNIQUE: Contiguous axial images were obtained from the base of the skull through the vertex without intravenous contrast. COMPARISON:  11/10/2011 FINDINGS: Brain: The ventricles are normal configuration. There is ventricular and sulcal enlargement reflecting mild generalized atrophy, advanced for age. No hydrocephalus. There are no parenchymal masses or mass effect. There are multiple old infarcts. There is an old left cerebellar infarct and an old infarct involving the right base a ganglia near the genu of the internal capsule another involving the left caudate nucleus head. Mild white matter hypoattenuation is noted consistent with chronic microvascular ischemic change. No extra-axial masses or abnormal fluid collections. There is no intracranial hemorrhage. Vascular: No hyperdense vessel or unexpected calcification.  Skull: Normal. Negative for fracture or focal lesion. Sinuses/Orbits: Mild maxillary sinus mucosal thickening. Clear mastoid air cells. Globes and orbits are unremarkable. Other: None. IMPRESSION: 1. No acute intracranial abnormalities. 2. Atrophy, advanced for age. 3. Old infarcts, new when compared to the prior CT. Electronically Signed   By: Amie Portland M.D.   On: 07/08/2016 17:09   Mr Brain Wo Contrast  Result Date: 07/09/2016 CLINICAL DATA:  Visual disturbance beginning this morning, neck pain. Intermittent frontal headaches for 1.5 weeks. Hypertensive. EXAM: MRI HEAD WITHOUT CONTRAST TECHNIQUE: Multiplanar, multiecho pulse sequences of the brain and surrounding structures were obtained without intravenous contrast. COMPARISON:  CT HEAD July 08, 2016 FINDINGS: Mild motion degraded examination. BRAIN: No reduced diffusion to suggest acute ischemia. Old moderate LEFT cerebellar infarct. Old pontine infarcts. A old bilateral basal ganglia and LEFT thalamus infarcts, with mineralization. Moderate ventriculomegaly on the basis of global parenchymal brain volume loss. Patchy supratentorial white matter FLAIR T2 hyperintensities. No midline shift, masses. No  abnormal extra-axial fluid collections. VASCULAR: Normal major intracranial vascular flow voids present at skull base. SKULL AND UPPER CERVICAL SPINE: No abnormal sellar expansion. No suspicious calvarial bone marrow signal. Craniocervical junction maintained. SINUSES/ORBITS: The mastoid air-cells and included paranasal sinuses are well-aerated. The included ocular globes and orbital contents are non-suspicious. OTHER: None. IMPRESSION: 1. No acute intracranial process. 2. 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 3. Moderate parenchymal brain volume loss, advanced for age. 4. Moderate chronic small vessel ischemic disease. Electronically Signed   By: Awilda Metroourtnay  Bloomer M.D.   On: 07/09/2016 03:49     Procedures Procedures (including critical care time)  Medications Ordered in ED Medications  oxyCODONE-acetaminophen (PERCOCET/ROXICET) 5-325 MG per tablet 1 tablet (1 tablet Oral Given 07/08/16 2154)  sodium chloride 0.9 % bolus 1,000 mL (0 mLs Intravenous Stopped 07/09/16 0254)  prochlorperazine (COMPAZINE) injection 10 mg (10 mg Intravenous Given 07/09/16 0123)  diphenhydrAMINE (BENADRYL) injection 25 mg (25 mg Intravenous Given 07/09/16 0122)  LORazepam (ATIVAN) injection 1 mg (1 mg Intravenous Given 07/09/16 0253)     Initial Impression / Assessment and Plan / ED Course  I have reviewed the triage vital signs and the nursing notes.  Pertinent labs & imaging results that were available during my care of the patient were reviewed by me and considered in my medical decision making (see chart for details).     Patient presents with waxing and waning headache as well as visual disturbance. His exam is benign at this time. He is nonfocal. Blood pressure 162/107. Considerations include hypertensive urgency, complicated migraine, stroke. The left likely subarachnoid hemorrhage given duration of symptoms. Patient given migraine cocktail. CT reviewed and reassuring. MRI ordered to evaluate for acute stroke. MRI shows no evidence of acute stroke but does show evidence of prior strokes. Patient symptoms completely resolve with migraine cocktail. He remains nonfocal. Unclear the exact etiology of his symptoms. He is hypertensive and remained hypertensive. Will start on lisinopril and HCTZ. Will also start on a full dose aspirin for stroke prophylaxis. Feel he can be worked up as an outpatient. He will be given outpatient follow-up with our neurology. He and his daughter were given strict return precautions.  After history, exam, and medical workup I feel the patient has been appropriately medically screened and is safe for discharge home. Pertinent diagnoses were discussed with the patient. Patient  was given return precautions.   Final Clinical Impressions(s) / ED Diagnoses   Final diagnoses:  Hypertension, unspecified type  Vision changes    I personally performed the services described in this documentation, which was scribed in my presence. The recorded information has been reviewed and is accurate.  New Prescriptions Discharge Medication List as of 07/09/2016  4:48 AM    START taking these medications   Details  aspirin 81 MG chewable tablet Chew 4 tablets (324 mg total) by mouth daily., Starting Tue 07/09/2016, Print    hydrochlorothiazide (HYDRODIURIL) 25 MG tablet Take 1 tablet (25 mg total) by mouth daily., Starting Tue 07/09/2016, Print    lisinopril (PRINIVIL,ZESTRIL) 10 MG tablet Take 1 tablet (10 mg total) by mouth daily., Starting Tue 07/09/2016, Print         Casey Batonourtney F Manford Sprong, MD 07/09/16 [phone]

## 2016-07-09 ENCOUNTER — Emergency Department (HOSPITAL_COMMUNITY): Payer: Self-pay

## 2016-07-09 MED ORDER — ASPIRIN 81 MG PO CHEW: 324.0000 mg | CHEWABLE_TABLET | Freq: Every day | ORAL | 0 refills | Status: DC

## 2016-07-09 MED ORDER — LISINOPRIL 10 MG PO TABS: 10.0000 mg | ORAL_TABLET | Freq: Every day | ORAL | 0 refills | Status: DC

## 2016-07-09 MED ORDER — HYDROCHLOROTHIAZIDE 25 MG PO TABS: 25.0000 mg | ORAL_TABLET | Freq: Every day | ORAL | 0 refills | Status: DC

## 2016-07-09 MED ORDER — LORAZEPAM 2 MG/ML IJ SOLN
1.0000 mg | Freq: Once | INTRAMUSCULAR | Status: AC
  Administered 2016-07-09: 1 mg via INTRAVENOUS
  Filled 2016-07-09: qty 1

## 2016-07-09 MED ORDER — PROCHLORPERAZINE EDISYLATE 5 MG/ML IJ SOLN
10.0000 mg | Freq: Once | INTRAMUSCULAR | Status: AC
  Administered 2016-07-09: 10 mg via INTRAVENOUS
  Filled 2016-07-09: qty 2

## 2016-07-09 MED ORDER — DIPHENHYDRAMINE HCL 50 MG/ML IJ SOLN
25.0000 mg | Freq: Once | INTRAMUSCULAR | Status: AC
  Administered 2016-07-09: 25 mg via INTRAVENOUS
  Filled 2016-07-09: qty 1

## 2016-07-09 MED ORDER — SODIUM CHLORIDE 0.9 % IV BOLUS (SEPSIS)
1000.0000 mL | Freq: Once | INTRAVENOUS | Status: AC
  Administered 2016-07-09: 1000 mL via INTRAVENOUS

## 2016-07-09 NOTE — Discharge Instructions (Signed)
You were seen today for headache and high blood pressure as well as vision changes. You improved with headache cocktail. He likely need to be started on blood pressure medication. There is no indication of an acute stroke; however, you do have evidence of old strokes. You need to start taking a full dose aspirin daily. You need to follow-up with neurology.

## 2016-07-09 NOTE — ED Notes (Signed)
Patient transported to MRI 

## 2017-01-13 ENCOUNTER — Emergency Department (HOSPITAL_COMMUNITY)
Admission: EM | Admit: 2017-01-13 | Discharge: 2017-01-14 | Disposition: A | Discharge status: Home / Self Care | Payer: Self-pay | Attending: Physician Assistant | Admitting: Physician Assistant

## 2017-01-13 ENCOUNTER — Emergency Department (HOSPITAL_COMMUNITY): Payer: Self-pay

## 2017-01-13 DIAGNOSIS — Z79899 Other long term (current) drug therapy: Secondary | ICD-10-CM | POA: Insufficient documentation

## 2017-01-13 DIAGNOSIS — Z7982 Long term (current) use of aspirin: Secondary | ICD-10-CM | POA: Insufficient documentation

## 2017-01-13 DIAGNOSIS — F17210 Nicotine dependence, cigarettes, uncomplicated: Secondary | ICD-10-CM | POA: Insufficient documentation

## 2017-01-13 DIAGNOSIS — R569 Unspecified convulsions: Secondary | ICD-10-CM

## 2017-01-13 LAB — CBC
HEMATOCRIT: 44.3 % (ref 39.0–52.0)
Hemoglobin: 15.7 g/dL (ref 13.0–17.0)
MCH: 31.3 pg (ref 26.0–34.0)
MCHC: 35.4 g/dL (ref 30.0–36.0)
MCV: 88.4 fL (ref 78.0–100.0)
PLATELETS: 232 10*3/uL (ref 150–400)
RBC: 5.01 MIL/uL (ref 4.22–5.81)
RDW: 13 % (ref 11.5–15.5)
WBC: 4 10*3/uL (ref 4.0–10.5)

## 2017-01-13 LAB — COMPREHENSIVE METABOLIC PANEL
ALBUMIN: 3.6 g/dL (ref 3.5–5.0)
ALK PHOS: 64 U/L (ref 38–126)
ALT: 18 U/L (ref 17–63)
ALT: 14 U/L — ABNORMAL LOW (ref 17–63)
ANION GAP: 9 (ref 5–15)
AST: 45 U/L — AB (ref 15–41)
AST: 19 U/L (ref 15–41)
Albumin: 3.7 g/dL (ref 3.5–5.0)
Alkaline Phosphatase: 62 U/L (ref 38–126)
Anion gap: 11 (ref 5–15)
BILIRUBIN TOTAL: 0.9 mg/dL (ref 0.3–1.2)
BILIRUBIN TOTAL: 0.4 mg/dL (ref 0.3–1.2)
BUN: 10 mg/dL (ref 6–20)
BUN: 9 mg/dL (ref 6–20)
CALCIUM: 8.7 mg/dL — AB (ref 8.9–10.3)
CALCIUM: 8.7 mg/dL — AB (ref 8.9–10.3)
CHLORIDE: 101 mmol/L (ref 101–111)
CO2: 19 mmol/L — ABNORMAL LOW (ref 22–32)
CO2: 22 mmol/L (ref 22–32)
CREATININE: 1.2 mg/dL (ref 0.61–1.24)
Chloride: 103 mmol/L (ref 101–111)
Creatinine, Ser: 1.12 mg/dL (ref 0.61–1.24)
GFR calc Af Amer: >60 mL/min (ref >60)
GLUCOSE: 97 mg/dL (ref 65–99)
Glucose, Bld: 102 mg/dL — ABNORMAL HIGH (ref 65–99)
Potassium: 5.7 mmol/L — ABNORMAL HIGH (ref 3.5–5.1)
Potassium: 4.3 mmol/L (ref 3.5–5.1)
Sodium: 131 mmol/L — ABNORMAL LOW (ref 135–145)
Sodium: 134 mmol/L — ABNORMAL LOW (ref 135–145)
TOTAL PROTEIN: 6.4 g/dL — AB (ref 6.5–8.1)
Total Protein: 6.1 g/dL — ABNORMAL LOW (ref 6.5–8.1)

## 2017-01-13 LAB — CBG MONITORING, ED: Glucose-Capillary: 104 mg/dL — ABNORMAL HIGH (ref 65–99)

## 2017-01-13 LAB — ETHANOL: Alcohol, Ethyl (B): 114 mg/dL — ABNORMAL HIGH (ref <5)

## 2017-01-13 LAB — I-STAT TROPONIN, ED: TROPONIN I, POC: 0 ng/mL (ref 0.00–0.08)

## 2017-01-13 MED ORDER — SODIUM CHLORIDE 0.9 % IV BOLUS (SEPSIS)
1000.0000 mL | Freq: Once | INTRAVENOUS | Status: AC
  Administered 2017-01-13: 1000 mL via INTRAVENOUS

## 2017-01-13 NOTE — ED Notes (Addendum)
CBG 104 
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
## 2017-01-13 NOTE — ED Provider Notes (Signed)
[facility] Provider Note   CSN: [id number] Arrival date & time: 01/13/17  2035     History   Chief Complaint Chief Complaint  Patient presents with  . Loss of Consciousness    HPI Casey Warren is a 56 y.o. male.  HPI   Patient is a 56 year old male daily drinker history of bleeding ulcer Presenting with first-time seizure. Patient had a couple of beers and was seen on his daughter's porch when he lost consciousness had some shaking and lost his urine. Patient now feels completely normal. He is asking to leave. Patient's wife brought him for workup. Patient had no symptoms beforehand and feels normal now.  Past Medical History:  Diagnosis Date  . 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 GERD (gastroesophageal reflux disease)   . History of blood transfusion   . Ulcer [facility])     Patient Active Problem List   Diagnosis Date Noted  . ANEMIA 04/28/2008  . ALCOHOL ABUSE 04/28/2008  . TOBACCO ABUSE 04/28/2008  . SUBSTANCE ABUSE, MULTIPLE 04/28/2008  . DENTAL CARIES 04/28/2008  . GERD 04/28/2008  . PEPTIC ULCER DISEASE, HELICOBACTER PYLORI POSITIVE 04/28/2008    Past Surgical History:  Procedure Laterality Date  . CLOSED REDUCTION NASAL FRACTURE  11/19/2011   Procedure: CLOSED REDUCTION NASAL FRACTURE;  Surgeon: Flo ShanksKarol Wolicki, MD;  [facility];  Service: ENT;  Laterality: N/A;  CLOSED REDUCTION NASAL AND NASAL SEPTAL FRACTURE WITH STABILIZATION  . No surgical         Home Medications    Prior to Admission medications   Medication Sig Start Date End Date Taking? Authorizing Provider  aspirin 81 MG chewable tablet Chew 4 tablets (324 mg total) by mouth daily. 07/09/16   Horton, Mayer Maskerourtney F, MD  hydrochlorothiazide (HYDRODIURIL) 25 MG tablet Take 1 tablet (25 mg total) by mouth daily. 07/09/16   Horton, Mayer Maskerourtney F, MD  lisinopril (PRINIVIL,ZESTRIL) 10 MG tablet Take 1 tablet (10 mg total) by mouth daily. 07/09/16   Horton, Mayer Maskerourtney F, MD    Family History No family  history on file.  Social History Social History  Substance Use Topics  . Smoking status: Current Every Day Smoker    Packs/day: 0.50  . Smokeless tobacco: Not on file  . Alcohol use Yes     Comment: Daily daughter unsure how muchj     Allergies   Patient has no known allergies.   Review of Systems Review of Systems  Constitutional: Negative for activity change.  Respiratory: Negative for shortness of breath.   Cardiovascular: Negative for chest pain.  Gastrointestinal: Negative for abdominal pain.  Neurological: Positive for syncope.     Physical Exam Updated Vital Signs BP 106/81 (BP Location: Right Arm)   Pulse 89   Temp 98.9 F (37.2 C) (Oral)   Resp 20   SpO2 96%   Physical Exam  Constitutional: He is oriented to person, place, and time. He appears well-nourished.  HENT:  Head: Normocephalic.  Eyes: Conjunctivae are normal.  Cardiovascular: Normal rate.   Pulmonary/Chest: Effort normal and breath sounds normal. No respiratory distress.  Neurological: He is oriented to person, place, and time.  Skin: Skin is warm and dry. He is not diaphoretic.  Psychiatric: He has a normal mood and affect. His behavior is normal.     ED Treatments / Results  Labs (all labs ordered are listed, but only abnormal results are displayed) Labs Reviewed  CBG MONITORING, ED - Abnormal; Notable for the following:  Result Value   Glucose-Capillary 104 (*)    All other components within normal limits  BASIC METABOLIC PANEL  CBC  URINALYSIS, ROUTINE W REFLEX MICROSCOPIC  COMPREHENSIVE METABOLIC PANEL  ETHANOL  I-STAT TROPONIN, ED    EKG  EKG Interpretation  Date/Time:  Monday January 13 2017 20:51:05 EDT Ventricular Rate:  91 PR Interval:    QRS Duration: 80 QT Interval:  356 QTC Calculation: 438 R Axis:   40 Text Interpretation:  Sinus rhythm Probable left atrial enlargement Minimal ST elevation, anterior leads similar to jan 2008 Confirmed by Corlis Leak, Ladan Vanderzanden  [phone]) on 01/13/2017 9:11:30 PM       Radiology No results found.  Procedures Procedures (including critical care time)  Medications Ordered in ED Medications  sodium chloride 0.9 % bolus 1,000 mL (not administered)     Initial Impression / Assessment and Plan / ED Course  I have reviewed the triage vital signs and the nursing notes.  Pertinent labs & imaging results that were available during my care of the patient were reviewed by me and considered in my medical decision making (see chart for details).     Very well-appearing 56 year old male with normal vital signs and normal labs presenting with syncope vs seizure. Patient has normal physical exam. It sounds like seizure versus syncope. Patient told not to drive. Patient told follow up with neurology. CT normal. Labs show no recent procedure. Patient reports drinking only lightly tonight. No withdrawal suspected.  It is possible that this does not represent seizure but rather syncope. However patient has no requirements of the [facility] syncope criteria.  We'll discharge patient home with strict return precautions.  Final Clinical Impressions(s) / ED Diagnoses   Final diagnoses:  None    New Prescriptions New Prescriptions   No medications on file     Abelino Derrick, MD 01/14/17 2326

## 2017-01-13 NOTE — ED Notes (Signed)
Pt asking to eat and drink.  Encouraged to offer urine sample, reminded about fluid running for hydration.  Pt transported to radiology

## 2017-01-14 LAB — URINALYSIS, ROUTINE W REFLEX MICROSCOPIC
Bilirubin Urine: NEGATIVE
GLUCOSE, UA: NEGATIVE mg/dL
Hgb urine dipstick: NEGATIVE
KETONES UR: NEGATIVE mg/dL
LEUKOCYTES UA: NEGATIVE
NITRITE: NEGATIVE
PROTEIN: NEGATIVE mg/dL
Specific Gravity, Urine: 1.015 (ref 1.005–1.030)
pH: 5 (ref 5.0–8.0)

## 2017-01-14 LAB — RAPID URINE DRUG SCREEN, HOSP PERFORMED
Amphetamines: NOT DETECTED
BENZODIAZEPINES: NOT DETECTED
Barbiturates: NOT DETECTED
COCAINE: NOT DETECTED
OPIATES: NOT DETECTED
Tetrahydrocannabinol: NOT DETECTED

## 2017-01-14 NOTE — Discharge Instructions (Signed)
You were seen today after you had what sounds like a seizure. Please do not drive until you follow up with neurology. Please return with any concerns.

## 2017-01-14 NOTE — ED Notes (Signed)
Pt verbalized understanding of d/c instructions and has no further questions. Pt is stable, A&Ox4, VSS.  

## 2017-02-05 ENCOUNTER — Ambulatory Visit (INDEPENDENT_AMBULATORY_CARE_PROVIDER_SITE_OTHER): Payer: Self-pay | Admitting: Physician Assistant

## 2017-02-05 ENCOUNTER — Encounter (INDEPENDENT_AMBULATORY_CARE_PROVIDER_SITE_OTHER): Payer: Self-pay | Admitting: Physician Assistant

## 2017-02-05 VITALS — BP 147/105 | HR 84 | Temp 98.5°F | Wt 155.6 lb

## 2017-02-05 DIAGNOSIS — R4020 Unspecified coma: Secondary | ICD-10-CM

## 2017-02-05 DIAGNOSIS — Z114 Encounter for screening for human immunodeficiency virus [HIV]: Secondary | ICD-10-CM

## 2017-02-05 DIAGNOSIS — I10 Essential (primary) hypertension: Secondary | ICD-10-CM

## 2017-02-05 DIAGNOSIS — Z09 Encounter for follow-up examination after completed treatment for conditions other than malignant neoplasm: Secondary | ICD-10-CM

## 2017-02-05 DIAGNOSIS — Z131 Encounter for screening for diabetes mellitus: Secondary | ICD-10-CM

## 2017-02-05 DIAGNOSIS — R9431 Abnormal electrocardiogram [ECG] [EKG]: Secondary | ICD-10-CM

## 2017-02-05 DIAGNOSIS — Z1159 Encounter for screening for other viral diseases: Secondary | ICD-10-CM

## 2017-02-05 LAB — POCT GLYCOSYLATED HEMOGLOBIN (HGB A1C): HEMOGLOBIN A1C: 5.6

## 2017-02-05 MED ORDER — ASPIRIN 81 MG PO CHEW
324.0000 mg | CHEWABLE_TABLET | Freq: Every day | ORAL | 11 refills | Status: DC
Start: 2017-02-05 — End: 2017-08-05

## 2017-02-05 MED ORDER — LISINOPRIL 10 MG PO TABS
10.0000 mg | ORAL_TABLET | Freq: Every day | ORAL | 5 refills | Status: DC
Start: 2017-02-05 — End: 2017-08-05

## 2017-02-05 MED ORDER — HYDROCHLOROTHIAZIDE 25 MG PO TABS: 25.0000 mg | ORAL_TABLET | Freq: Every day | ORAL | 5 refills | Status: DC

## 2017-02-05 NOTE — Progress Notes (Signed)
Subjective:  Patient ID: VIKRANT PRYCE, male    DOB: 12-Sep-1960  Age: 56 y.o. MRN: [id number]  CC: hospital f/u   HPI JUANITA DEVINCENT is a 56 y.o. male with a medical history of HTN, anxiety, GERD, and ulcer presents on hospital f/u. ED visit on 01/13/17 for LOC. Patient had been drinking alcohol when he was found on his porch unconscious, shaking, and incontinent of feces and urine. LOC estimated to be one minute. Patient states he began sweating profusing before he lost consciousness. EKG with stable abnormal findings. CT head normal except for chronic ischemic changes. Labs unremarkable except for elevated ETOH at 114 mg/dL. Suspected syncope rather than seizure.     Pt has run out of anti-hypertensives for two months. Took Lisinopril and HCTZ to good effect. Requests refill.       Outpatient Medications Prior to Visit  Medication Sig Dispense Refill  . aspirin 81 MG chewable tablet Chew 4 tablets (324 mg total) by mouth daily. 30 tablet 0  . hydrochlorothiazide (HYDRODIURIL) 25 MG tablet Take 1 tablet (25 mg total) by mouth daily. (Patient not taking: Reported on 02/05/2017) 30 tablet 0  . lisinopril (PRINIVIL,ZESTRIL) 10 MG tablet Take 1 tablet (10 mg total) by mouth daily. (Patient not taking: Reported on 02/05/2017) 30 tablet 0   No facility-administered medications prior to visit.      ROS Review of Systems  Constitutional: Negative for chills, fever and malaise/fatigue.  Eyes: Negative for blurred vision.  Respiratory: Negative for shortness of breath.   Cardiovascular: Negative for chest pain and palpitations.  Gastrointestinal: Negative for abdominal pain and nausea.  Genitourinary: Negative for dysuria and hematuria.  Musculoskeletal: Negative for joint pain and myalgias.  Skin: Negative for rash.  Neurological: Negative for tingling and headaches.  Psychiatric/Behavioral: Negative for depression. The patient is not nervous/anxious.     Objective:  BP (!) 147/105 (BP  Location: Right Arm, Patient Position: Sitting, Cuff Size: Normal)   Pulse 84   Temp 98.5 F (36.9 C) (Oral)   Wt 155 lb 9.6 oz (70.6 kg)   SpO2 96%   BMI 27.13 kg/m   BP/Weight 02/05/2017 01/14/2017 07/09/2016  Systolic BP 147 137 162  Diastolic BP 105 96 107  Wt. (Lbs) 155.6 - -  BMI 27.13 - -      Physical Exam  Constitutional: He is oriented to person, place, and time.  Well developed, well nourished, NAD, polite  HENT:  Head: Normocephalic and atraumatic.  Eyes: No scleral icterus.  Neck: Normal range of motion. Neck supple. No thyromegaly present.  Cardiovascular: Normal rate, regular rhythm and normal heart sounds.   No LE edema bilaterally  Pulmonary/Chest: Effort normal and breath sounds normal. No respiratory distress. He has no wheezes.  Abdominal: Soft. Bowel sounds are normal. There is no tenderness.  Musculoskeletal: He exhibits no edema.  Neurological: He is alert and oriented to person, place, and time. No cranial nerve deficit. Coordination normal.  Skin: Skin is warm and dry. No rash noted. No erythema. No pallor.  Psychiatric: He has a normal mood and affect. His behavior is normal. Thought content normal.  Vitals reviewed.    Assessment & Plan:   1. LOC (loss of consciousness) (HCC) - Ambulatory referral to Neurology - Hepatic Function Panel  2. Nonspecific abnormal electrocardiogram (ECG) (EKG) - Ambulatory referral to Cardiology  3. Hypertension, unspecified type - Refill hydrochlorothiazide (HYDRODIURIL) 25 MG tablet; Take 1 tablet (25 mg total) by mouth daily.  Dispense:  30 tablet; Refill: 5 - Refill lisinopril (PRINIVIL,ZESTRIL) 10 MG tablet; Take 1 tablet (10 mg total) by mouth daily.  Dispense: 30 tablet; Refill: 5 - Refill aspirin 81 MG chewable tablet; Chew 4 tablets (324 mg total) by mouth daily.  Dispense: 30 tablet; Refill: 11 - Ambulatory referral to Cardiology - TSH - Lipid Panel  4. Encounter for screening for HIV - HIV  antibody  5. Need for hepatitis C screening test - Hepatitis c antibody (reflex)  6. Screening for diabetes mellitus - HgB A1c  7. Hospital discharge follow up    Meds ordered this encounter  Medications  . hydrochlorothiazide (HYDRODIURIL) 25 MG tablet    Sig: Take 1 tablet (25 mg total) by mouth daily.    Dispense:  30 tablet    Refill:  5    Order Specific Question:   Supervising Provider    Answer:   Quentin AngstJEGEDE, OLUGBEMIGA E [id number]]  . lisinopril (PRINIVIL,ZESTRIL) 10 MG tablet    Sig: Take 1 tablet (10 mg total) by mouth daily.    Dispense:  30 tablet    Refill:  5    Order Specific Question:   Supervising Provider    Answer:   Quentin AngstJEGEDE, OLUGBEMIGA E [id number]]  . aspirin 81 MG chewable tablet    Sig: Chew 4 tablets (324 mg total) by mouth daily.    Dispense:  30 tablet    Refill:  11    Order Specific Question:   Supervising Provider    Answer:   Quentin AngstJEGEDE, OLUGBEMIGA E [[id number]]    Follow-up: 6 weeks  Loletta Specteroger David Gomez PA

## 2017-02-05 NOTE — Patient Instructions (Signed)
Syncope °Syncope is when you temporarily lose consciousness. Syncope may also be called fainting or passing out. It is caused by a sudden decrease in blood flow to the brain. Even though most causes of syncope are not dangerous, syncope can be a sign of a serious medical problem. Signs that you may be about to faint include: °· Feeling dizzy or light-headed. °· Feeling nauseous. °· Seeing all white or all black in your field of vision. °· Having cold, clammy skin. °If you fainted, get medical help right away.Call your local emergency services (911 in the [place] Do not drive yourself to the hospital. °Follow these instructions at home: °Pay attention to any changes in your symptoms. Take these actions to help with your condition: °· Have someone stay with you until you feel stable. °· Do not drive, use machinery, or play sports until your health care provider says it is okay. °· Keep all follow-up visits as told by your health care provider. This is important. °· If you start to feel like you might faint, lie down right away and raise (elevate) your feet above the level of your heart. Breathe deeply and steadily. Wait until all of the symptoms have passed. °· Drink enough fluid to keep your urine clear or pale yellow. °· If you are taking blood pressure or heart medicine, get up slowly and take several minutes to sit and then stand. This can reduce dizziness. °· Take over-the-counter and prescription medicines only as told by your health care provider. °Get help right away if: °· You have a severe headache. °· You have unusual pain in your chest, abdomen, or back. °· You are bleeding from your mouth or rectum, or you have black or tarry stool. °· You have a very fast or irregular heartbeat (palpitations). °· You have pain with breathing. °· You faint once or repeatedly. °· You have a seizure. °· You are confused. °· You have trouble walking. °· You have severe weakness. °· You have vision problems. °These symptoms  may represent a serious problem that is an emergency. Do not wait to see if your symptoms will go away. Get medical help right away. Call your local emergency services (911 in the [place] Do not drive yourself to the hospital. °This information is not intended to replace advice given to you by your health care provider. Make sure you discuss any questions you have with your health care provider. °Document Released: 05/27/2005 Document Revised: 11/02/2015 Document Reviewed: 02/08/2015 °Elsevier Interactive Patient Education © 2017 [employer] ° °

## 2017-02-06 LAB — TSH: TSH: 2.2 u[IU]/mL (ref 0.450–4.500)

## 2017-02-06 LAB — HEPATIC FUNCTION PANEL
ALBUMIN: 4.2 g/dL (ref 3.5–5.5)
ALK PHOS: 99 IU/L (ref 39–117)
ALT: 14 IU/L (ref 0–44)
AST: 15 IU/L (ref 0–40)
BILIRUBIN, DIRECT: 0.07 mg/dL (ref 0.00–0.40)
Bilirubin Total: 0.3 mg/dL (ref 0.0–1.2)
Total Protein: 7.1 g/dL (ref 6.0–8.5)

## 2017-02-06 LAB — LIPID PANEL
CHOL/HDL RATIO: 3.2 ratio (ref 0.0–5.0)
CHOLESTEROL TOTAL: 162 mg/dL (ref 100–199)
HDL: 50 mg/dL (ref >39)
LDL CALC: 93 mg/dL (ref 0–99)
Triglycerides: 96 mg/dL (ref 0–149)
VLDL Cholesterol Cal: 19 mg/dL (ref 5–40)

## 2017-02-06 LAB — HCV COMMENT:

## 2017-02-06 LAB — HIV ANTIBODY (ROUTINE TESTING W REFLEX): HIV Screen 4th Generation wRfx: NONREACTIVE

## 2017-02-06 LAB — HEPATITIS C ANTIBODY (REFLEX): HCV Ab: <0.1 s/co ratio (ref 0.0–0.9)

## 2017-03-07 ENCOUNTER — Ambulatory Visit: Payer: Self-pay | Admitting: Cardiology

## 2017-04-01 NOTE — Progress Notes (Deleted)
Cardiology Office Note   Date:  04/01/2017   ID:  Casey Warren, Casey Warren Jan 06, 1961, MRN [id number]  PCP:  Patient, No Pcp Per  Cardiologist:   Rollene Rotunda, MD  Referring:  ***  No chief complaint on file.     History of Present Illness: Casey Warren is a 56 y.o. male who was referred by *** for evaluation of HTN and an abnormal EKG.  ***     Past Medical History:  Diagnosis Date  . 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 GERD (gastroesophageal reflux disease)   . History of blood transfusion   . Ulcer     Past Surgical History:  Procedure Laterality Date  . CLOSED REDUCTION NASAL FRACTURE  11/19/2011   Procedure: CLOSED REDUCTION NASAL FRACTURE;  Surgeon: Flo Shanks, MD;  Location: [facility] OR;  Service: ENT;  Laterality: N/A;  CLOSED REDUCTION NASAL AND NASAL SEPTAL FRACTURE WITH STABILIZATION  . No surgical       Current Outpatient Prescriptions  Medication Sig Dispense Refill  . aspirin 81 MG chewable tablet Chew 4 tablets (324 mg total) by mouth daily. 30 tablet 11  . hydrochlorothiazide (HYDRODIURIL) 25 MG tablet Take 1 tablet (25 mg total) by mouth daily. 30 tablet 5  . lisinopril (PRINIVIL,ZESTRIL) 10 MG tablet Take 1 tablet (10 mg total) by mouth daily. 30 tablet 5   No current facility-administered medications for this visit.     Allergies:   Citrus    Social History:  The patient  reports that he has been smoking.  He has been smoking about 0.50 packs per day. He has never used smokeless tobacco. He reports that he drinks alcohol. He reports that he does not use drugs.   Family History:  The patient's ***family history is not on file.    ROS:  Please see the history of present illness.   Otherwise, review of systems are positive for {NONE DEFAULTED:18576::"none"}.   All other systems are reviewed and negative.    PHYSICAL EXAM: VS:  There were no vitals taken for this visit. , BMI There is no height or weight on file to calculate BMI. GENERAL:  Well  appearing HEENT:  Pupils equal round and reactive, fundi not visualized, oral mucosa unremarkable NECK:  No jugular venous distention, waveform within normal limits, carotid upstroke brisk and symmetric, no bruits, no thyromegaly LYMPHATICS:  No cervical, inguinal adenopathy LUNGS:  Clear to auscultation bilaterally BACK:  No CVA tenderness CHEST:  Unremarkable HEART:  PMI not displaced or sustained,S1 and S2 within normal limits, no S3, no S4, no clicks, no rubs, *** murmurs ABD:  Flat, positive bowel sounds normal in frequency in pitch, no bruits, no rebound, no guarding, no midline pulsatile mass, no hepatomegaly, no splenomegaly EXT:  2 plus pulses throughout, no edema, no cyanosis no clubbing SKIN:  No rashes no nodules NEURO:  Cranial nerves II through XII grossly intact, motor grossly intact throughout PSYCH:  Cognitively intact, oriented to person place and time    EKG:  EKG {ACTION; IS/IS XBJ:[id number]} ordered today. The ekg ordered today demonstrates ***   Recent Labs: 01/13/2017: BUN 9; Creatinine, Ser 1.12; Hemoglobin 15.7; Platelets 232; Potassium 4.3; Sodium 134 02/05/2017: ALT 14; TSH 2.200    Lipid Panel    Component Value Date/Time   CHOL 162 02/05/2017 1535   TRIG 96 02/05/2017 1535   HDL 50 02/05/2017 1535   CHOLHDL 3.2 02/05/2017 1535   LDLCALC 93 02/05/2017 1535  Wt Readings from Last 3 Encounters:  02/05/17 155 lb 9.6 oz (70.6 kg)  07/08/16 159 lb (72.1 kg)  11/18/11 149 lb (67.6 kg)      Other studies Reviewed: Additional studies/ records that were reviewed today include: ***. Review of the above records demonstrates:  Please see elsewhere in the note.  ***   ASSESSMENT AND PLAN:  *** HTN:  ***  ABNORMAL EKG:  ***  Current medicines are reviewed at length with the patient today.  The patient {ACTIONS; HAS/DOES NOT HAVE:19233} concerns regarding medicines.  The following changes have been made:  {PLAN; NO CHANGE:13088:s}  Labs/ tests  ordered today include: *** No orders of the defined types were placed in this encounter.    Disposition:   FU with ***    Signed, Rollene RotundaJames Valoria Tamburri, MD  04/01/2017 9:42 PM    [facility]

## 2017-04-02 ENCOUNTER — Ambulatory Visit: Payer: Self-pay | Admitting: Cardiology

## 2017-04-03 NOTE — Progress Notes (Signed)
Cardiology Office Note   Date:  04/06/2017   ID:  Casey Warren, Casey Warren [birth year], MRN [id number]  PCP:  Patient, No Pcp Per  Cardiologist:   Rollene Rotunda, MD  Referring:  Loletta Specter, PA-C  Chief Complaint  Patient presents with  . Hypertension      History of Present Illness: Casey Warren is a 56 y.o. male who was referred by Loletta Specter, PA-C for evaluation of HTN and an abnormal EKG. that he had a syncopal episode twice.  I do not have any details of the first episode.  However, the second episode he was in the emergency room and I reviewed these data.  He was thought to have a seizure.  He had loss of bowel and bladder.  He was sitting on his porch.  He said he had a couple of beers.  He did have a head CT that demonstrated some old lacunar infarcts.  EKG was unremarkable.  He had no further cardiovascular testing.  However, he has had some abnormalities on his EKG with some inferolateral T wave inversions.  He has had some hypertension recently diagnosed.  However, it seems to have responded nicely to lisinopril HCT.  He is never had any cardiovascular testing.  However, he denies any cardiovascular symptoms.  The patient denies any new symptoms such as chest discomfort, neck or arm discomfort. There has been no new shortness of breath, PND or orthopnea. There have been no reported palpitations, presyncope or syncope.    Past Medical History:  Diagnosis Date  . 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 GERD (gastroesophageal reflux disease)   . History of blood transfusion   . HTN (hypertension)   . Ulcer     Past Surgical History:  Procedure Laterality Date  . CLOSED REDUCTION NASAL FRACTURE  11/19/2011   Procedure: CLOSED REDUCTION NASAL FRACTURE;  Surgeon: Flo Shanks, MD;  Location: [facility] OR;  Service: ENT;  Laterality: N/A;  CLOSED REDUCTION NASAL AND NASAL SEPTAL FRACTURE WITH STABILIZATION  . No surgical       Current Outpatient Prescriptions    Medication Sig Dispense Refill  . aspirin 81 MG chewable tablet Chew 4 tablets (324 mg total) by mouth daily. 30 tablet 11  . hydrochlorothiazide (HYDRODIURIL) 25 MG tablet Take 1 tablet (25 mg total) by mouth daily. 30 tablet 5  . lisinopril (PRINIVIL,ZESTRIL) 10 MG tablet Take 1 tablet (10 mg total) by mouth daily. 30 tablet 5   No current facility-administered medications for this visit.     Allergies:   Citrus    Social History:  The patient  reports that he has been smoking.  He has been smoking about 0.50 packs per day. He has never used smokeless tobacco. He reports that he drinks alcohol. He reports that he does not use drugs.   Family History:  The patient's family history includes Breast cancer in his mother; Diabetes in his mother; Hypertension in his mother; Leukemia in his father; Stroke in his mother.    ROS:  Please see the history of present illness.   Otherwise, review of systems are positive for none.   All other systems are reviewed and negative.   Three sisters with DM.  Once sister with sarcoid   PHYSICAL EXAM: VS:  BP 114/66 (BP Location: Left Arm, Patient Position: Sitting, Cuff Size: Normal)   Pulse 92   Ht 5' 3.5" (1.613 m)   Wt 156 lb (70.8 kg)  SpO2 97%   BMI 27.20 kg/m  , BMI Body mass index is 27.2 kg/m. GENERAL:  Well appearing HEENT:  Pupils equal round and reactive, fundi not visualized, oral mucosa unremarkable NECK:  No jugular venous distention, waveform within normal limits, carotid upstroke brisk and symmetric, no bruits, no thyromegaly LYMPHATICS:  No cervical, inguinal adenopathy LUNGS:  Clear to auscultation bilaterally BACK:  No CVA tenderness CHEST:  Unremarkable HEART:  PMI not displaced or sustained,S1 and S2 within normal limits, no S3, no S4, no clicks, no rubs, no murmurs ABD:  Flat, positive bowel sounds normal in frequency in pitch, no bruits, no rebound, no guarding, no midline pulsatile mass, no hepatomegaly, no  splenomegaly EXT:  2 plus pulses throughout, no edema, no cyanosis no clubbing SKIN:  No rashes no nodules NEURO:  Cranial nerves II through XII grossly intact, motor grossly intact throughout PSYCH:  Cognitively intact, oriented to person place and time    EKG:  EKG is ordered today. The ekg ordered today demonstrates sinus rhythm, rate 92, axis within normal limits, intervals within normal limits, inferolateral T wave inversions unchanged from previous.   Recent Labs: 01/13/2017: BUN 9; Creatinine, Ser 1.12; Hemoglobin 15.7; Platelets 232; Potassium 4.3; Sodium 134 02/05/2017: ALT 14; TSH 2.200    Lipid Panel    Component Value Date/Time   CHOL 162 02/05/2017 1535   TRIG 96 02/05/2017 1535   HDL 50 02/05/2017 1535   CHOLHDL 3.2 02/05/2017 1535   LDLCALC 93 02/05/2017 1535      Wt Readings from Last 3 Encounters:  04/04/17 156 lb (70.8 kg)  02/05/17 155 lb 9.6 oz (70.6 kg)  07/08/16 159 lb (72.1 kg)      Other studies Reviewed: Additional studies/ records that were reviewed today include: ED records. Review of the above records demonstrates:  Please see elsewhere in the note.     ASSESSMENT AND PLAN:   HTN: His blood pressure seems to be well controlled with meds as listed.  I do not think further testing for secondary causes is indicated or change in meds.  ABNORMAL EKG: I am going to start with an echocardiogram.  Pending the results of this I will consider further testing with a treadmill.  TOBACCO ABUSE:  I gave him the number for 1 800 QUIT NOW  Current medicines are reviewed at length with the patient today.  The patient does not have concerns regarding medicines.  The following changes have been made:  no change  Labs/ tests ordered today include:   Orders Placed This Encounter  Procedures  . EKG 12-Lead  . ECHOCARDIOGRAM COMPLETE     Disposition:   FU with me as needed based on the results of the above.     Signed, Rollene RotundaJames Assia Meanor, MD   04/06/2017 9:33 AM    [facility]

## 2017-04-04 ENCOUNTER — Encounter: Payer: Self-pay | Admitting: Cardiology

## 2017-04-04 ENCOUNTER — Ambulatory Visit (INDEPENDENT_AMBULATORY_CARE_PROVIDER_SITE_OTHER): Payer: Self-pay | Admitting: Cardiology

## 2017-04-04 VITALS — BP 114/66 | HR 92 | Ht 63.5 in | Wt 156.0 lb

## 2017-04-04 DIAGNOSIS — R9431 Abnormal electrocardiogram [ECG] [EKG]: Secondary | ICD-10-CM

## 2017-04-04 DIAGNOSIS — Z720 Tobacco use: Secondary | ICD-10-CM

## 2017-04-04 DIAGNOSIS — I10 Essential (primary) hypertension: Secondary | ICD-10-CM

## 2017-04-04 NOTE — Patient Instructions (Signed)
Medication Instructions:  Continue current medications  If you need a refill on your cardiac medications before your next appointment, please call your pharmacy.  Labwork: None Ordered   Testing/Procedures: Your physician has requested that you have an echocardiogram. Echocardiography is a painless test that uses sound waves to create images of your heart. It provides your doctor with information about the size and shape of your heart and how well your heart's chambers and valves are working. This procedure takes approximately one hour. There are no restrictions for this procedure.  Follow-Up: Your physician wants you to follow-up in: As Needed.   1-800-Quit-Now   [facility] you [facility]!!

## 2017-04-06 ENCOUNTER — Encounter: Payer: Self-pay | Admitting: Cardiology

## 2017-04-06 DIAGNOSIS — I10 Essential (primary) hypertension: Secondary | ICD-10-CM | POA: Insufficient documentation

## 2017-04-06 DIAGNOSIS — R9431 Abnormal electrocardiogram [ECG] [EKG]: Secondary | ICD-10-CM | POA: Insufficient documentation

## 2017-04-16 ENCOUNTER — Encounter: Payer: Self-pay | Admitting: Neurology

## 2017-04-16 ENCOUNTER — Ambulatory Visit (INDEPENDENT_AMBULATORY_CARE_PROVIDER_SITE_OTHER): Payer: Self-pay | Admitting: Neurology

## 2017-04-16 VITALS — BP 116/88 | HR 86 | Ht 63.5 in | Wt 159.0 lb

## 2017-04-16 DIAGNOSIS — Z789 Other specified health status: Secondary | ICD-10-CM

## 2017-04-16 DIAGNOSIS — R55 Syncope and collapse: Secondary | ICD-10-CM

## 2017-04-16 DIAGNOSIS — Z7289 Other problems related to lifestyle: Secondary | ICD-10-CM

## 2017-04-16 DIAGNOSIS — Z8673 Personal history of transient ischemic attack (TIA), and cerebral infarction without residual deficits: Secondary | ICD-10-CM

## 2017-04-16 NOTE — Progress Notes (Signed)
NEUROLOGY CONSULTATION NOTE  Casey MORT MRN: [id number] DOB: 1961/06/01  Referring provider: Loletta Specter, PA-C Primary care provider: Loletta Specter, PA-C  Reason for consult:  Loss of consciousness  Thank you for your kind referral of JOSIAS Warren for consultation of the above symptoms. Although his history is well known to you, please allow me to reiterate it for the purpose of our medical record. The patient was accompanied to the clinic by his daughter who also provides collateral information. Records and images were personally reviewed where available.  HISTORY OF PRESENT ILLNESS: This is a 56 year old right-handed man with a history of hypertension, alcohol abuse, presenting for evaluation of an episode of loss of consciousness last 01/13/2017. He has no recollection of the episode, but does remember sitting on the porch then feeling hot and sweaty, then waking up in the ambulance. His daughter reports that he was talking then suddenly passed out. He had bowel and bladder incontinence, no tongue bite. Per ER notes, there was some shaking, however his daughter denies any shaking or abnormal movements at that time. His mouth was noted to be lower on one side, he was out for a minute, then woke up able to follow commands but speech was a little slurred. He could not raise both arms above his head. In the ER, exam was normal. His EKG showed sinus rhythm, probable left atrial enlargement, minimal ST elevation, anterior leads. His CBC, CMP, UDS were negative. EtOH level was 114. I personally reviewed head CT without contrast which did not show any acute changes. 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 He denies any similar episodes since then. His daughter reports an episode of loss of  consciousness 8 years ago where he was rushed to the hospital and needed a blood transfusion. His daughter denies any staring/unresponsive episodes. He denies any gaps in time, olfactory/gustatory hallucinations, deja vu, rising epigastric sensation, focal numbness/tingling/weakness, myoclonic jerks. He reports drinking 2 regular cans of beer prior to the event. His daughter reports that the patient's wife makes sure he only drinks 2 cans of beer a day. He used to drink heavily in the past. He does not drive. He had a normal birth and early development.  There is no history of febrile convulsions, CNS infections such as meningitis/encephalitis, significant traumatic brain injury, neurosurgical procedures, or family history of seizures. He has been evaluated by Cardiology and scheduled for an echocardiogram.   PAST MEDICAL HISTORY: Past Medical History:  Diagnosis Date  . 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 GERD (gastroesophageal reflux disease)   . History of blood transfusion   . HTN (hypertension)   . Ulcer     PAST SURGICAL HISTORY: Past Surgical History:  Procedure Laterality Date  . No surgical      MEDICATIONS: Current Outpatient Medications on File Prior to Visit  Medication Sig Dispense Refill  . aspirin 81 MG chewable tablet Chew 4 tablets (324 mg total) by mouth daily. 30 tablet 11  . hydrochlorothiazide (HYDRODIURIL) 25 MG tablet Take 1 tablet (25 mg total) by mouth daily. 30 tablet 5  . lisinopril (PRINIVIL,ZESTRIL) 10 MG tablet Take 1 tablet (10 mg total) by mouth daily. 30 tablet 5   No current facility-administered medications on file prior to visit.     ALLERGIES: Allergies  Allergen Reactions  . Citrus     FAMILY HISTORY: Family History  Problem Relation Age of Onset  . Stroke Mother        Died age 58  . Hypertension Mother   . Diabetes Mother   . Breast cancer Mother   . Leukemia Father        Died age 58    SOCIAL HISTORY: Social History    Socioeconomic History  . Marital status: Married    Spouse name: Not on file  . Number of children: 3  . Years of education: Not on file  . Highest education level: Not on file  Social Needs  . Financial resource strain: Not on file  . Food insecurity - worry: Not on file  . Food insecurity - inability: Not on file  . Transportation needs - medical: Not on file  . Transportation needs - non-medical: Not on file  Occupational History  . Not on file  Tobacco Use  . Smoking status: Current Every Day Smoker    Packs/day: 0.50  . Smokeless tobacco: Never Used  Substance and Sexual Activity  . Alcohol use: Yes    Comment: Two cans of beer dialy.   . Drug use: No  . Sexual activity: Not on file  Other Topics Concern  . Not on file  Social History Narrative   Works at the [employer].     REVIEW OF SYSTEMS: Constitutional: No fevers, chills, or sweats, no generalized fatigue, change in appetite Eyes: No visual changes, double vision, eye pain Ear, nose and throat: No hearing loss, ear pain, nasal congestion, sore throat Cardiovascular: No chest pain, palpitations Respiratory:  No shortness of breath at rest or with exertion, wheezes GastrointestinaI: No nausea, vomiting, diarrhea, abdominal pain, fecal incontinence Genitourinary:  No dysuria, urinary retention or frequency Musculoskeletal:  No neck pain, back pain Integumentary: No rash, pruritus, skin lesions Neurological: as above Psychiatric: No depression, insomnia, anxiety Endocrine: No palpitations, fatigue, diaphoresis, mood swings, change in appetite, change in weight, increased thirst Hematologic/Lymphatic:  No anemia, purpura, petechiae. Allergic/Immunologic: no itchy/runny eyes, nasal congestion, recent allergic reactions, rashes  PHYSICAL EXAM: Vitals:   04/16/17 0927  BP: 116/88  Pulse: 86  SpO2: 97%   General: No acute distress Head:  Normocephalic/atraumatic Eyes: Fundoscopic exam shows bilateral  sharp discs, no vessel changes, exudates, or hemorrhages Neck: supple, no paraspinal tenderness, full range of motion Back: No paraspinal tenderness Heart: regular rate and rhythm Lungs: Clear to auscultation bilaterally. Vascular: No carotid bruits. Skin/Extremities: No rash, no edema Neurological Exam: Mental status: alert and oriented to person, place, and date but did not know year, no dysarthria or aphasia, Fund of knowledge is appropriate.  Recent and remote memory are impaired. 1/3 delayed recall. Attention and concentration are normal.    Able to name objects and repeat phrases. Cranial nerves: CN I: not tested CN II: pupils equal, round and reactive to light, visual fields intact, fundi unremarkable. CN III, IV, VI:  full range of motion, no nystagmus, no ptosis CN V: facial sensation intact CN VII: upper and lower face symmetric CN VIII: hearing intact to finger rub CN IX, X: gag intact, uvula midline CN XI: sternocleidomastoid and trapezius muscles intact CN XII: tongue midline Bulk & Tone: normal, no fasciculations. Motor: 5/5 throughout with no pronator drift. Sensation: intact to light touch, cold, pin, vibration and joint position sense.  No extinction to double simultaneous stimulation.  Romberg test negative Deep Tendon Reflexes: +2 throughout, no  ankle clonus Plantar responses: downgoing bilaterally Cerebellar: no incoordination on finger to nose testing Gait: narrow-based and steady, unable to tandem walk Tremor: none  IMPRESSION: This is a 56 year old right-handed man with a history of hypertension, alcohol abuse, presenting for evaluation of an episode of loss of consciousness with bladder/bowel incontinence last 01/13/17, suggestive of syncope, less likely seizure. His alcohol level was 114. 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 An EEG will be ordered to assess for focal abnormalities that increase risk for recurrent seizure. He is scheduled for an echocardiogram this week. We discussed effects of alcohol on brain health, including memory, he was advised to slowly wean off alcohol use. He does not drive. Our office will call him with EEG results, if normal, he will follow-up on a prn basis. He knows to go to the ER for any sudden change in symptoms.   Thank you for allowing me to participate in the care of this patient. Please do not hesitate to call for any questions or concerns.   Patrcia DollyKaren Aquino, M.D.  CC: Loletta Specteroger David Gomez, PA-C

## 2017-04-16 NOTE — Patient Instructions (Signed)
1. Schedule routine EEG 2. Proceed with echocardiogram as scheduled 3. Continue aspirin, control of blood pressure, cholesterol to help prevent further strokes 4. Start weaning off alcohol slowly 5. If symptoms change suddenly, go to ER immediately 6. Our office will call you with results, if normal, follow-up on as needed basis

## 2017-04-17 ENCOUNTER — Ambulatory Visit: Payer: Self-pay | Admitting: Cardiology

## 2017-04-18 ENCOUNTER — Other Ambulatory Visit (HOSPITAL_COMMUNITY): Payer: Self-pay

## 2017-04-22 ENCOUNTER — Ambulatory Visit (INDEPENDENT_AMBULATORY_CARE_PROVIDER_SITE_OTHER): Payer: Self-pay | Admitting: Neurology

## 2017-04-22 DIAGNOSIS — Z789 Other specified health status: Secondary | ICD-10-CM

## 2017-04-22 DIAGNOSIS — R55 Syncope and collapse: Secondary | ICD-10-CM

## 2017-04-22 DIAGNOSIS — Z7289 Other problems related to lifestyle: Secondary | ICD-10-CM

## 2017-04-22 DIAGNOSIS — Z8673 Personal history of transient ischemic attack (TIA), and cerebral infarction without residual deficits: Secondary | ICD-10-CM

## 2017-04-29 NOTE — Procedures (Signed)
ELECTROENCEPHALOGRAM REPORT  Date of Study: 04/22/2017  Patient's Name: Casey Warren MRN: [id number] Date of Birth: [birth year]  Referring Provider: Dr. Patrcia DollyKaren Aquino  Clinical History: This is a 56 year old man with an episode of loss of consciousness last 01/13/17.  Medications: aspirin 81 MG chewable tablet  HYDRODIURIL 25 MG tablet  PRINIVIL,ZESTRIL 10 MG tablet   Technical Summary: A multichannel digital 1-hour EEG recording measured by the international 10-20 system with electrodes applied with paste and impedances below 5000 ohms performed in our laboratory with EKG monitoring in an awake and drowsy patient.  Hyperventilation and photic stimulation were performed.  The digital EEG was referentially recorded, reformatted, and digitally filtered in a variety of bipolar and referential montages for optimal display.    Description: The patient is awake and drowsy during the recording.  During maximal wakefulness, there is a symmetric, medium voltage 8.5-9 Hz posterior dominant rhythm that attenuates with eye opening.  The record is symmetric.  During drowsiness, there is an increase in theta slowing of the background. Deeper stages of sleep were not seen. Hyperventilation and photic stimulation did not elicit any abnormalities.  There were no epileptiform discharges or electrographic seizures seen.    EKG lead was unremarkable.  Impression: This 1-hour awake and drowsy EEG is normal.    Clinical Correlation: A normal EEG does not exclude a clinical diagnosis of epilepsy.  If further clinical questions remain, prolonged EEG may be helpful.  Clinical correlation is advised.   Patrcia DollyKaren Aquino, M.D.

## 2017-05-15 ENCOUNTER — Encounter (HOSPITAL_COMMUNITY): Payer: Self-pay | Admitting: Radiology

## 2017-08-04 ENCOUNTER — Telehealth: Payer: Self-pay | Admitting: Cardiology

## 2017-08-04 NOTE — Telephone Encounter (Signed)
New message  Pt daughter verbalized that she is calling for the RN  To order Echo

## 2017-08-04 NOTE — Telephone Encounter (Signed)
Returned the call to the patient's daughter. She stated that her father missed his last appointment for his echo. She has been informed that scheduling will reach out to her to reschedule.

## 2017-08-05 ENCOUNTER — Telehealth: Payer: Self-pay | Admitting: *Deleted

## 2017-08-05 ENCOUNTER — Ambulatory Visit (INDEPENDENT_AMBULATORY_CARE_PROVIDER_SITE_OTHER): Payer: Self-pay | Admitting: Physician Assistant

## 2017-08-05 ENCOUNTER — Encounter (INDEPENDENT_AMBULATORY_CARE_PROVIDER_SITE_OTHER): Payer: Self-pay | Admitting: Physician Assistant

## 2017-08-05 VITALS — BP 134/88 | HR 93 | Temp 98.1°F | Resp 18 | Ht 63.0 in | Wt 167.0 lb

## 2017-08-05 DIAGNOSIS — Z760 Encounter for issue of repeat prescription: Secondary | ICD-10-CM

## 2017-08-05 DIAGNOSIS — R0982 Postnasal drip: Secondary | ICD-10-CM

## 2017-08-05 DIAGNOSIS — R9431 Abnormal electrocardiogram [ECG] [EKG]: Secondary | ICD-10-CM

## 2017-08-05 DIAGNOSIS — F411 Generalized anxiety disorder: Secondary | ICD-10-CM

## 2017-08-05 MED ORDER — CLONAZEPAM 0.5 MG PO TABS: 0.5000 mg | ORAL_TABLET | Freq: Every day | ORAL | 0 refills | Status: DC

## 2017-08-05 MED ORDER — LISINOPRIL 10 MG PO TABS: 10.0000 mg | ORAL_TABLET | Freq: Every day | ORAL | 1 refills | Status: DC

## 2017-08-05 MED ORDER — ESCITALOPRAM OXALATE 10 MG PO TABS: 10.0000 mg | ORAL_TABLET | Freq: Every day | ORAL | 2 refills | Status: DC

## 2017-08-05 MED ORDER — DM-APAP-CPM 15-500-2 MG PO TABS: 2.0000 | ORAL_TABLET | Freq: Four times a day (QID) | ORAL | 0 refills | Status: AC

## 2017-08-05 MED ORDER — ASPIRIN 81 MG PO CHEW: 324.0000 mg | CHEWABLE_TABLET | Freq: Every day | ORAL | 11 refills | Status: DC

## 2017-08-05 MED ORDER — HYDROCHLOROTHIAZIDE 25 MG PO TABS: 25.0000 mg | ORAL_TABLET | Freq: Every day | ORAL | 1 refills | Status: DC

## 2017-08-05 NOTE — Progress Notes (Signed)
Subjective:  Patient ID: Casey Warren Semrad, male    DOB: 1960/12/25  Age: 57 y.o. MRN: [id number]  CC: URI  HPI  Casey Warren Hair is a 57 y.o. male with a medical history of HTN, anxiety, GERD, and ulcer presents with nasal congestion, cough, and a "tickle" in the throat over the past week. Has not been exposed to illness nor did he feel sick preceding his symptoms. Has not taken anything for relief. Says he is concerned because the cough is worse at night and he feels as if his throat is so full of mucus that he can not breath normally. Says he has to go outside to get fresh air to breath better and feel more calm. He is a smoker. Does not endorse any other symptoms or complaints.     Outpatient Medications Prior to Visit  Medication Sig Dispense Refill  . aspirin 81 MG chewable tablet Chew 4 tablets (324 mg total) by mouth daily. 30 tablet 11  . hydrochlorothiazide (HYDRODIURIL) 25 MG tablet Take 1 tablet (25 mg total) by mouth daily. 30 tablet 5  . lisinopril (PRINIVIL,ZESTRIL) 10 MG tablet Take 1 tablet (10 mg total) by mouth daily. 30 tablet 5   No facility-administered medications prior to visit.      ROS Review of Systems  Constitutional: Negative for chills, fever and malaise/fatigue.  Eyes: Negative for blurred vision.  Respiratory: Positive for cough. Negative for shortness of breath.   Cardiovascular: Negative for chest pain and palpitations.  Gastrointestinal: Negative for abdominal pain and nausea.  Genitourinary: Negative for dysuria and hematuria.  Musculoskeletal: Negative for joint pain and myalgias.  Skin: Negative for rash.  Neurological: Negative for tingling and headaches.  Psychiatric/Behavioral: Negative for depression. The patient is not nervous/anxious.     Objective:  BP 134/88 (BP Location: Right Arm, Patient Position: Sitting, Cuff Size: Normal)   Pulse 93   Temp 98.1 F (36.7 Warren) (Oral)   Resp 18   Ht 5\' 3"  (1.6 m)   Wt 167 lb (75.8 kg)   SpO2 97%    BMI 29.58 kg/m   BP/Weight 08/05/2017 04/16/2017 04/04/2017  Systolic BP 134 116 114  Diastolic BP 88 88 66  Wt. (Lbs) 167 159 156  BMI 29.58 27.72 27.2      Physical Exam  Constitutional: He is oriented to person, place, and time.  Well developed, well nourished, NAD, polite  HENT:  Head: Normocephalic and atraumatic.  Turbinates moderately hypertrophic. Post nasal drip. Mildly erythematous pharynx with no exudates. No sinus tenderness to palpation.  Eyes: No scleral icterus.  Cardiovascular: Normal rate, regular rhythm and normal heart sounds.  Pulmonary/Chest: Effort normal and breath sounds normal. No respiratory distress. He has no wheezes. He has no rales.  Musculoskeletal: He exhibits no edema.  Lymphadenopathy:    He has no cervical adenopathy.  Neurological: He is alert and oriented to person, place, and time. No cranial nerve deficit. Coordination normal.  Skin: Skin is warm and dry. No rash noted. No erythema. No pallor.  Psychiatric: He has a normal mood and affect. His behavior is normal. Thought content normal.  Vitals reviewed.    Assessment & Plan:    1. Postnasal drip - Begin DM-APAP-CPM (CORICIDIN HBP FLU) 15-500-2 MG TABS; Take 2 tablets by mouth every 6 (six) hours for 5 days.  Dispense: 40 each; Refill: 0  2. Generalized anxiety disorder - Begin escitalopram (LEXAPRO) 10 MG tablet; Take 1 tablet (10 mg total) by mouth daily.  Dispense: 30 tablet; Refill: 2 - Begin clonazePAM (KLONOPIN) 0.5 MG tablet; Take 1 tablet (0.5 mg total) by mouth at bedtime.  Dispense: 21 tablet; Refill: 0  3. Medication refill - hydrochlorothiazide (HYDRODIURIL) 25 MG tablet; Take 1 tablet (25 mg total) by mouth daily.  Dispense: 90 tablet; Refill: 1 - lisinopril (PRINIVIL,ZESTRIL) 10 MG tablet; Take 1 tablet (10 mg total) by mouth daily.  Dispense: 90 tablet; Refill: 1 - aspirin 81 MG chewable tablet; Chew 4 tablets (324 mg total) by mouth daily.  Dispense: 30 tablet; Refill:  11    Meds ordered this encounter  Medications  . DM-APAP-CPM (CORICIDIN HBP FLU) 15-500-2 MG TABS    Sig: Take 2 tablets by mouth every 6 (six) hours for 5 days.    Dispense:  40 each    Refill:  0    Order Specific Question:   Supervising Provider    Answer:   Quentin Angst [id number]  . escitalopram (LEXAPRO) 10 MG tablet    Sig: Take 1 tablet (10 mg total) by mouth daily.    Dispense:  30 tablet    Refill:  2    Order Specific Question:   Supervising Provider    Answer:   Quentin Angst [id number]  . clonazePAM (KLONOPIN) 0.5 MG tablet    Sig: Take 1 tablet (0.5 mg total) by mouth at bedtime.    Dispense:  21 tablet    Refill:  0    Order Specific Question:   Supervising Provider    Answer:   Quentin Angst [id number]  . hydrochlorothiazide (HYDRODIURIL) 25 MG tablet    Sig: Take 1 tablet (25 mg total) by mouth daily.    Dispense:  90 tablet    Refill:  1    Order Specific Question:   Supervising Provider    Answer:   Quentin Angst [id number]  . lisinopril (PRINIVIL,ZESTRIL) 10 MG tablet    Sig: Take 1 tablet (10 mg total) by mouth daily.    Dispense:  90 tablet    Refill:  1    Order Specific Question:   Supervising Provider    Answer:   Quentin Angst [id number]  . aspirin 81 MG chewable tablet    Sig: Chew 4 tablets (324 mg total) by mouth daily.    Dispense:  30 tablet    Refill:  11    Order Specific Question:   Supervising Provider    Answer:   Quentin Angst [[id number]]    Follow-up: Return in about 4 weeks (around 09/02/2017) for anxiety.   Loletta Specter PA

## 2017-08-05 NOTE — Patient Instructions (Signed)

## 2017-08-05 NOTE — Telephone Encounter (Signed)
New Echo order has been placed. The patient missed his first appointment. Scheduling will be in touch to get this scheduled.

## 2017-08-11 ENCOUNTER — Other Ambulatory Visit: Payer: Self-pay

## 2017-08-11 ENCOUNTER — Ambulatory Visit (HOSPITAL_COMMUNITY): Payer: Self-pay | Attending: Cardiology

## 2017-08-11 DIAGNOSIS — I10 Essential (primary) hypertension: Secondary | ICD-10-CM | POA: Insufficient documentation

## 2017-08-11 DIAGNOSIS — Z720 Tobacco use: Secondary | ICD-10-CM | POA: Insufficient documentation

## 2017-08-11 DIAGNOSIS — R9431 Abnormal electrocardiogram [ECG] [EKG]: Secondary | ICD-10-CM | POA: Insufficient documentation

## 2017-08-11 DIAGNOSIS — R55 Syncope and collapse: Secondary | ICD-10-CM | POA: Insufficient documentation

## 2017-08-11 DIAGNOSIS — I253 Aneurysm of heart: Secondary | ICD-10-CM | POA: Insufficient documentation

## 2017-08-13 NOTE — Progress Notes (Signed)
Cardiology Office Note   Date:  08/14/2017   ID:  Casey Warren, Casey Warren 1961/04/26, MRN [id number]  PCP:  Loletta Specter, PA-C  Cardiologist:   Rollene Rotunda, MD  Referring:  No ref. provider found  Chief Complaint  Patient presents with  . Abnormal ECG      History of Present Illness: Casey Warren is a 57 y.o. male who was referred by No ref. provider found for evaluation of HTN and an abnormal EKG. that he had a syncopal episode twice.  I do not have any details of the first episode.  However, the second episode he was in the emergency room and I reviewed these data.  He was thought to have a seizure.  He had loss of bowel and bladder.   He did have a head CT that demonstrated some old lacunar infarcts.  EKG was unremarkable.  He had no further cardiovascular testing.  However, he has had some abnormalities on his EKG with some inferolateral T wave inversions.     He returns to discuss this.  He is had no further episodes similar to that described above.  He is very active. The patient denies any new symptoms such as chest discomfort, neck or arm discomfort. There has been no new shortness of breath, PND or orthopnea. There have been no reported palpitations, presyncope or syncope.     Past Medical History:  Diagnosis Date  . 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 GERD (gastroesophageal reflux disease)   . History of blood transfusion   . HTN (hypertension)   . Ulcer     Past Surgical History:  Procedure Laterality Date  . CLOSED REDUCTION NASAL FRACTURE  11/19/2011   Procedure: CLOSED REDUCTION NASAL FRACTURE;  Surgeon: Flo Shanks, MD;  Location: [facility] OR;  Service: ENT;  Laterality: N/A;  CLOSED REDUCTION NASAL AND NASAL SEPTAL FRACTURE WITH STABILIZATION  . No surgical       Current Outpatient Medications  Medication Sig Dispense Refill  . aspirin 81 MG chewable tablet Chew 4 tablets (324 mg total) by mouth daily. 30 tablet 11  . clonazePAM (KLONOPIN) 0.5 MG tablet  Take 1 tablet (0.5 mg total) by mouth at bedtime. 21 tablet 0  . escitalopram (LEXAPRO) 10 MG tablet Take 1 tablet (10 mg total) by mouth daily. 30 tablet 2  . hydrochlorothiazide (HYDRODIURIL) 25 MG tablet Take 1 tablet (25 mg total) by mouth daily. 90 tablet 1  . lisinopril (PRINIVIL,ZESTRIL) 10 MG tablet Take 1 tablet (10 mg total) by mouth daily. 90 tablet 1   No current facility-administered medications for this visit.     Allergies:   Citrus    ROS:  Please see the history of present illness.   Otherwise, review of systems are positive for none.   All other systems are reviewed and negative.     PHYSICAL EXAM: VS:  BP 124/89   Pulse 97   Ht 5\' 3"  (1.6 m)   Wt 161 lb 6.4 oz (73.2 kg)   BMI 28.59 kg/m  , BMI Body mass index is 28.59 kg/m.  GENERAL:  Well appearing NECK:  No jugular venous distention, waveform within normal limits, carotid upstroke brisk and symmetric, no bruits, no thyromegaly LUNGS:  Clear to auscultation bilaterally CHEST:  Unremarkable HEART:  PMI not displaced or sustained,S1 and S2 within normal limits, no S3, no S4, no clicks, no rubs, no murmurs ABD:  Flat, positive bowel sounds normal in frequency  in pitch, no bruits, no rebound, no guarding, no midline pulsatile mass, no hepatomegaly, no splenomegaly EXT:  2 plus pulses throughout, no edema, no cyanosis no clubbing    EKG:  EKG is not ordered today.    Recent Labs: 01/13/2017: BUN 9; Creatinine, Ser 1.12; Hemoglobin 15.7; Platelets 232; Potassium 4.3; Sodium 134 02/05/2017: ALT 14; TSH 2.200    Lipid Panel    Component Value Date/Time   CHOL 162 02/05/2017 1535   TRIG 96 02/05/2017 1535   HDL 50 02/05/2017 1535   CHOLHDL 3.2 02/05/2017 1535   LDLCALC 93 02/05/2017 1535      Wt Readings from Last 3 Encounters:  08/14/17 161 lb 6.4 oz (73.2 kg)  08/05/17 167 lb (75.8 kg)  04/16/17 159 lb (72.1 kg)      Other studies Reviewed: Additional studies/ records that were reviewed today  include: Echo Review of the above records demonstrates:    ASSESSMENT AND PLAN:   HTN:   His blood pressure is now well controlled.  This is likely the etiology of his EKG changes.  He will continue the meds as listed.   ABNORMAL EKG:  Echo was normal.  Given this in the absence of any symptoms no further cardiovascular testing is suggested.   TOBACCO ABUSE:    He was given instructions to call [phone]  Current medicines are reviewed at length with the patient today.  The patient does not have concerns regarding medicines.  The following changes have been made:   None  Labs/ tests ordered today include: None No orders of the defined types were placed in this encounter.    Disposition:   FU with me as needed.    Signed, Rollene RotundaJames Chrislynn Mosely, MD  08/14/2017 12:27 PM    [facility]

## 2017-08-14 ENCOUNTER — Encounter: Payer: Self-pay | Admitting: Cardiology

## 2017-08-14 ENCOUNTER — Ambulatory Visit (INDEPENDENT_AMBULATORY_CARE_PROVIDER_SITE_OTHER): Payer: Self-pay | Admitting: Cardiology

## 2017-08-14 VITALS — BP 124/89 | HR 97 | Ht 63.0 in | Wt 161.4 lb

## 2017-08-14 DIAGNOSIS — I10 Essential (primary) hypertension: Secondary | ICD-10-CM

## 2017-08-14 DIAGNOSIS — R9431 Abnormal electrocardiogram [ECG] [EKG]: Secondary | ICD-10-CM

## 2017-08-14 NOTE — Patient Instructions (Signed)
Medication Instructions:  Continue current medications  If you need a refill on your cardiac medications before your next appointment, please call your pharmacy.  Labwork: None Ordered  Testing/Procedures: None Ordered  Follow-Up: Your physician wants you to follow-up in: As Needed.      Thank you for choosing [facility]!!       

## 2017-08-27 ENCOUNTER — Ambulatory Visit (INDEPENDENT_AMBULATORY_CARE_PROVIDER_SITE_OTHER): Payer: Self-pay | Admitting: Physician Assistant

## 2017-08-27 ENCOUNTER — Ambulatory Visit: Payer: Self-pay | Attending: Physician Assistant

## 2017-08-28 ENCOUNTER — Encounter (INDEPENDENT_AMBULATORY_CARE_PROVIDER_SITE_OTHER): Payer: Self-pay | Admitting: Physician Assistant

## 2017-08-28 ENCOUNTER — Ambulatory Visit (INDEPENDENT_AMBULATORY_CARE_PROVIDER_SITE_OTHER): Payer: Self-pay | Admitting: Physician Assistant

## 2017-08-28 ENCOUNTER — Other Ambulatory Visit: Payer: Self-pay

## 2017-08-28 VITALS — BP 139/98 | HR 102 | Temp 98.5°F | Wt 162.0 lb

## 2017-08-28 DIAGNOSIS — J302 Other seasonal allergic rhinitis: Secondary | ICD-10-CM

## 2017-08-28 DIAGNOSIS — Z23 Encounter for immunization: Secondary | ICD-10-CM

## 2017-08-28 DIAGNOSIS — F418 Other specified anxiety disorders: Secondary | ICD-10-CM

## 2017-08-28 DIAGNOSIS — Z1211 Encounter for screening for malignant neoplasm of colon: Secondary | ICD-10-CM

## 2017-08-28 MED ORDER — ESCITALOPRAM OXALATE 20 MG PO TABS: 20.0000 mg | ORAL_TABLET | Freq: Every day | ORAL | 6 refills | Status: DC

## 2017-08-28 MED ORDER — LEVOCETIRIZINE DIHYDROCHLORIDE 5 MG PO TABS: 5.0000 mg | ORAL_TABLET | Freq: Every evening | ORAL | 0 refills | Status: DC

## 2017-08-28 MED ORDER — FLUTICASONE PROPIONATE 50 MCG/ACT NA SUSP: 2.0000 | Freq: Every day | NASAL | 6 refills | Status: DC

## 2017-08-28 NOTE — Patient Instructions (Signed)

## 2017-08-28 NOTE — Progress Notes (Signed)
Subjective:  Patient ID: Casey Warren, male    DOB: February 11, 1961  Age: 57 y.o. MRN: [id number]  CC: f/u anxiety  HPI Casey Warren a 57 y.o.malewith a medical history of HTN, anxiety, GERD, and ulcer presents to f/u on anxiety. Began Lexapro and Clonazepam nearly one month ago. Says he is feeling much better. Sleeps through the night and does not feel nervous. Relative present with patient attests to the fact he is much less anxious and sleeping better. Patient asks for treatment for seasonal allergies. Says he has a stuffy nose and a tickle in his throat. Does not endorse any other symptoms or complaints.       Outpatient Medications Prior to Visit  Medication Sig Dispense Refill  . aspirin 81 MG chewable tablet Chew 4 tablets (324 mg total) by mouth daily. 30 tablet 11  . clonazePAM (KLONOPIN) 0.5 MG tablet Take 1 tablet (0.5 mg total) by mouth at bedtime. 21 tablet 0  . escitalopram (LEXAPRO) 10 MG tablet Take 1 tablet (10 mg total) by mouth daily. 30 tablet 2  . hydrochlorothiazide (HYDRODIURIL) 25 MG tablet Take 1 tablet (25 mg total) by mouth daily. 90 tablet 1  . lisinopril (PRINIVIL,ZESTRIL) 10 MG tablet Take 1 tablet (10 mg total) by mouth daily. 90 tablet 1   No facility-administered medications prior to visit.      ROS Review of Systems  Constitutional: Negative for chills, fever and malaise/fatigue.  HENT: Positive for congestion. Negative for sore throat.   Eyes: Negative for blurred vision.  Respiratory: Negative for shortness of breath.   Cardiovascular: Negative for chest pain and palpitations.  Gastrointestinal: Negative for abdominal pain and nausea.  Genitourinary: Negative for dysuria and hematuria.  Musculoskeletal: Negative for joint pain and myalgias.  Skin: Negative for rash.  Neurological: Negative for tingling and headaches.  Psychiatric/Behavioral: Negative for depression. The patient is not nervous/anxious.     Objective:  Wt 162 lb (73.5  kg)   BMI 28.70 kg/m   Vitals:   08/28/17 1332  BP: (!) 139/98  Pulse: (!) 102  Temp: 98.5 F (36.9 C)  SpO2: 95%      Physical Exam  Constitutional: He is oriented to person, place, and time.  Well developed, well nourished, NAD, polite  HENT:  Head: Normocephalic and atraumatic.  Mildly pale turbinates with no hypertrophy bilaterally. No sinus tenderness to palpation.   Eyes: No scleral icterus.  Neck: Normal range of motion. Neck supple.  Cardiovascular: Normal rate, regular rhythm and normal heart sounds.  Pulmonary/Chest: Effort normal and breath sounds normal.  Musculoskeletal: He exhibits no edema.  Lymphadenopathy:    He has no cervical adenopathy.  Neurological: He is alert and oriented to person, place, and time.  Skin: Skin is warm and dry. No rash noted. No erythema. No pallor.  Psychiatric: He has a normal mood and affect. His behavior is normal. Thought content normal.  Vitals reviewed.    Assessment & Plan:   1. Seasonal allergies - levocetirizine (XYZAL) 5 MG tablet; Take 1 tablet (5 mg total) by mouth every evening.  Dispense: 30 tablet; Refill: 0 - fluticasone (FLONASE) 50 MCG/ACT nasal spray; Place 2 sprays into both nostrils daily.  Dispense: 16 g; Refill: 6  2. Anxiety with depression - escitalopram (LEXAPRO) 20 MG tablet; Take 1 tablet (20 mg total) by mouth daily.  Dispense: 30 tablet; Refill: 6  3. Need for Tdap vaccination - Tdap vaccine greater than or equal to 7yo IM; Future  4. Need for influenza vaccination - Flu Vaccine QUAD 6+ mos PF IM (Fluarix Quad PF); Future  5. Special screening for malignant neoplasms, colon - Fecal occult blood, imunochemical   Meds ordered this encounter  Medications  . escitalopram (LEXAPRO) 20 MG tablet    Sig: Take 1 tablet (20 mg total) by mouth daily.    Dispense:  30 tablet    Refill:  6    Order Specific Question:   Supervising Provider    Answer:   Casey Warren [id number]  .  levocetirizine (XYZAL) 5 MG tablet    Sig: Take 1 tablet (5 mg total) by mouth every evening.    Dispense:  30 tablet    Refill:  0    Order Specific Question:   Supervising Provider    Answer:   Casey Warren [id number]  . fluticasone (FLONASE) 50 MCG/ACT nasal spray    Sig: Place 2 sprays into both nostrils daily.    Dispense:  16 g    Refill:  6    Order Specific Question:   Supervising Provider    Answer:   Casey Warren [[id number]]    Follow-up: Return in about 5 months (around 01/28/2018).   Loletta Specter PA

## 2017-09-03 LAB — FECAL OCCULT BLOOD, IMMUNOCHEMICAL: FECAL OCCULT BLD: NEGATIVE

## 2017-09-04 ENCOUNTER — Telehealth (INDEPENDENT_AMBULATORY_CARE_PROVIDER_SITE_OTHER): Payer: Self-pay

## 2017-09-04 NOTE — Telephone Encounter (Signed)
Spoke with patients wife and provided results of negative FIT, she stated she will inform patient. Casey Warren S Josep Luviano, CMA

## 2017-09-04 NOTE — Telephone Encounter (Signed)
-----   Message from Loletta Specteroger David Gomez, PA-C sent at 09/04/2017  3:27 PM EDT ----- Negative FIT.

## 2017-09-11 ENCOUNTER — Ambulatory Visit: Payer: Self-pay | Admitting: Cardiology

## 2017-09-11 DIAGNOSIS — R0989 Other specified symptoms and signs involving the circulatory and respiratory systems: Secondary | ICD-10-CM

## 2017-09-12 ENCOUNTER — Encounter: Payer: Self-pay | Admitting: *Deleted

## 2017-09-12 NOTE — Progress Notes (Signed)
Letter mailed

## 2017-09-24 ENCOUNTER — Other Ambulatory Visit (INDEPENDENT_AMBULATORY_CARE_PROVIDER_SITE_OTHER): Payer: Self-pay | Admitting: Physician Assistant

## 2017-09-24 DIAGNOSIS — J302 Other seasonal allergic rhinitis: Secondary | ICD-10-CM

## 2017-12-28 ENCOUNTER — Other Ambulatory Visit (INDEPENDENT_AMBULATORY_CARE_PROVIDER_SITE_OTHER): Payer: Self-pay | Admitting: Physician Assistant

## 2017-12-28 DIAGNOSIS — F411 Generalized anxiety disorder: Secondary | ICD-10-CM

## 2017-12-29 NOTE — Telephone Encounter (Signed)
FWD to PCP. Tempestt S Roberts, CMA  

## 2018-03-05 ENCOUNTER — Other Ambulatory Visit (INDEPENDENT_AMBULATORY_CARE_PROVIDER_SITE_OTHER): Payer: Self-pay | Admitting: Physician Assistant

## 2018-03-05 DIAGNOSIS — I10 Essential (primary) hypertension: Secondary | ICD-10-CM

## 2018-03-06 NOTE — Telephone Encounter (Signed)
FWD to PCP. Tempestt S Roberts, CMA  

## 2018-03-28 ENCOUNTER — Other Ambulatory Visit (INDEPENDENT_AMBULATORY_CARE_PROVIDER_SITE_OTHER): Payer: Self-pay | Admitting: Physician Assistant

## 2018-03-28 DIAGNOSIS — F411 Generalized anxiety disorder: Secondary | ICD-10-CM

## 2018-03-30 NOTE — Telephone Encounter (Signed)
FWD to PCP. Tempestt S Roberts, CMA  

## 2018-04-02 ENCOUNTER — Other Ambulatory Visit (INDEPENDENT_AMBULATORY_CARE_PROVIDER_SITE_OTHER): Payer: Self-pay | Admitting: Physician Assistant

## 2018-04-02 DIAGNOSIS — I10 Essential (primary) hypertension: Secondary | ICD-10-CM

## 2018-04-02 NOTE — Telephone Encounter (Signed)
FWD to PCP. Wendle Kina S Olayinka Gathers, CMA  

## 2018-07-29 ENCOUNTER — Ambulatory Visit (INDEPENDENT_AMBULATORY_CARE_PROVIDER_SITE_OTHER): Payer: Self-pay | Admitting: Primary Care

## 2018-07-29 ENCOUNTER — Encounter (INDEPENDENT_AMBULATORY_CARE_PROVIDER_SITE_OTHER): Payer: Self-pay | Admitting: Primary Care

## 2018-07-29 ENCOUNTER — Other Ambulatory Visit: Payer: Self-pay

## 2018-07-29 VITALS — BP 142/101 | HR 113 | Temp 98.1°F | Ht 63.0 in | Wt 171.6 lb

## 2018-07-29 DIAGNOSIS — I10 Essential (primary) hypertension: Secondary | ICD-10-CM

## 2018-07-29 DIAGNOSIS — K029 Dental caries, unspecified: Secondary | ICD-10-CM

## 2018-07-29 DIAGNOSIS — F411 Generalized anxiety disorder: Secondary | ICD-10-CM

## 2018-07-29 DIAGNOSIS — F418 Other specified anxiety disorders: Secondary | ICD-10-CM

## 2018-07-29 DIAGNOSIS — G4700 Insomnia, unspecified: Secondary | ICD-10-CM

## 2018-07-29 MED ORDER — ESCITALOPRAM OXALATE 20 MG PO TABS: 20.0000 mg | ORAL_TABLET | Freq: Every day | ORAL | 6 refills | Status: DC

## 2018-07-29 MED ORDER — LISINOPRIL 10 MG PO TABS: 10.0000 mg | ORAL_TABLET | Freq: Every day | ORAL | 1 refills | Status: DC

## 2018-07-29 MED ORDER — CLONAZEPAM 0.5 MG PO TABS: 0.5000 mg | ORAL_TABLET | Freq: Every day | ORAL | 1 refills | Status: DC

## 2018-07-29 MED ORDER — HYDROCHLOROTHIAZIDE 25 MG PO TABS: 25.0000 mg | ORAL_TABLET | Freq: Every day | ORAL | 1 refills | Status: DC

## 2018-07-29 NOTE — Patient Instructions (Signed)

## 2018-07-29 NOTE — Progress Notes (Signed)
Subjective:  Patient ID: Casey Warren, male    DOB: 1960/10/11  Age: 58 y.o. MRN: [id number]  CC: Hypertension and Anxiety   HPI Casey Warren presents for follow up on anxiety and hypertension. He has a  with a medical history of  GERD, and ulcer presents to f/u on anxiety. He was prescribed Lexapro and Clonazepam stated it was working good until he ran out. He wakes up in the middle of the night feeling like he is suffocating. He smokes 4-5 cigars daily.  Outpatient Medications Prior to Visit  Medication Sig Dispense Refill  . aspirin 81 MG chewable tablet Chew 4 tablets (324 mg total) by mouth daily. 30 tablet 11  . fluticasone (FLONASE) 50 MCG/ACT nasal spray Place 2 sprays into both nostrils daily. 16 g 6  . hydrochlorothiazide (HYDRODIURIL) 25 MG tablet TAKE 1 TABLET BY MOUTH EVERY DAY 90 tablet 1  . lisinopril (PRINIVIL,ZESTRIL) 10 MG tablet TAKE 1 TABLET BY MOUTH EVERY DAY 90 tablet 1  . escitalopram (LEXAPRO) 20 MG tablet Take 1 tablet (20 mg total) by mouth daily. (Patient not taking: Reported on 07/29/2018) 30 tablet 6  . clonazePAM (KLONOPIN) 0.5 MG tablet Take 1 tablet (0.5 mg total) by mouth at bedtime. 21 tablet 0  . hydrochlorothiazide (HYDRODIURIL) 25 MG tablet Take 1 tablet (25 mg total) by mouth daily. 90 tablet 1  . levocetirizine (XYZAL) 5 MG tablet TAKE 1 TABLET BY MOUTH EVERY DAY IN THE EVENING 30 tablet 0  . lisinopril (PRINIVIL,ZESTRIL) 10 MG tablet Take 1 tablet (10 mg total) by mouth daily. 90 tablet 1   No facility-administered medications prior to visit.     ROS Review of Systems  Constitutional: Negative.   HENT: Positive for dental problem.   Eyes: Negative.   Respiratory: Negative.   Cardiovascular: Negative.   Gastrointestinal: Negative.   Endocrine: Negative.   Genitourinary: Negative.   Musculoskeletal: Negative.   Skin: Negative.     Objective:  BP (!) 142/101 (BP Location: Right Arm, Patient Position: Sitting, Cuff Size: Normal)   Pulse  (!) 113   Temp 98.1 F (36.7 C) (Oral)   Ht 5\' 3"  (1.6 m)   Wt 171 lb 9.6 oz (77.8 kg)   SpO2 93%   BMI 30.40 kg/m   BP/Weight 07/29/2018 08/28/2017 08/14/2017  Systolic BP 142 139 124  Diastolic BP 101 98 89  Wt. (Lbs) 171.6 162 161.4  BMI 30.4 28.7 28.59      Physical Exam Constitutional:      Appearance: Normal appearance.  HENT:     Head: Normocephalic.     Right Ear: Tympanic membrane normal.     Left Ear: Tympanic membrane normal.     Mouth/Throat:     Mouth: Mucous membranes are moist.  Eyes:     Extraocular Movements: Extraocular movements intact.     Pupils: Pupils are equal, round, and reactive to light.  Neck:     Musculoskeletal: Normal range of motion.  Cardiovascular:     Rate and Rhythm: Normal rate and regular rhythm.     Pulses: Normal pulses.  Pulmonary:     Effort: Pulmonary effort is normal.     Breath sounds: Normal breath sounds.  Abdominal:     General: Bowel sounds are normal.  Skin:    General: Skin is warm and dry.  Neurological:     Mental Status: He is alert and oriented to person, place, and time.      Assessment &  Plan:  Casey Warren was seen today for hypertension and anxiety.  Diagnoses and all orders for this visit:  Anxiety with depression  Essential hypertension  Dental caries  Insomnia, unspecified type     Follow-up:  Dr. Delford Field r/o OSA or   Grayce Sessions NP

## 2018-07-29 NOTE — Addendum Note (Signed)
Addended by: Grayce Sessions on: 07/29/2018 04:05 PM   Modules accepted: Orders

## 2018-08-26 ENCOUNTER — Ambulatory Visit (INDEPENDENT_AMBULATORY_CARE_PROVIDER_SITE_OTHER): Payer: Self-pay | Admitting: Primary Care

## 2018-09-21 ENCOUNTER — Encounter (HOSPITAL_BASED_OUTPATIENT_CLINIC_OR_DEPARTMENT_OTHER): Payer: Self-pay

## 2018-10-06 ENCOUNTER — Other Ambulatory Visit (INDEPENDENT_AMBULATORY_CARE_PROVIDER_SITE_OTHER): Payer: Self-pay | Admitting: Primary Care

## 2018-10-06 DIAGNOSIS — F411 Generalized anxiety disorder: Secondary | ICD-10-CM

## 2018-10-06 DIAGNOSIS — G4700 Insomnia, unspecified: Secondary | ICD-10-CM

## 2018-10-06 DIAGNOSIS — F418 Other specified anxiety disorders: Secondary | ICD-10-CM

## 2018-10-07 ENCOUNTER — Other Ambulatory Visit: Payer: Self-pay | Admitting: Primary Care

## 2018-10-07 DIAGNOSIS — F419 Anxiety disorder, unspecified: Secondary | ICD-10-CM

## 2019-03-12 ENCOUNTER — Other Ambulatory Visit: Payer: Self-pay

## 2019-03-12 ENCOUNTER — Encounter (INDEPENDENT_AMBULATORY_CARE_PROVIDER_SITE_OTHER): Payer: Self-pay | Admitting: Primary Care

## 2019-03-12 ENCOUNTER — Ambulatory Visit (INDEPENDENT_AMBULATORY_CARE_PROVIDER_SITE_OTHER): Payer: Self-pay | Admitting: Primary Care

## 2019-03-12 VITALS — BP 150/92 | HR 96 | Temp 96.0°F | Ht 63.0 in | Wt 168.8 lb

## 2019-03-12 DIAGNOSIS — F17200 Nicotine dependence, unspecified, uncomplicated: Secondary | ICD-10-CM

## 2019-03-12 DIAGNOSIS — Z760 Encounter for issue of repeat prescription: Secondary | ICD-10-CM

## 2019-03-12 DIAGNOSIS — I10 Essential (primary) hypertension: Secondary | ICD-10-CM

## 2019-03-12 DIAGNOSIS — Z23 Encounter for immunization: Secondary | ICD-10-CM

## 2019-03-12 DIAGNOSIS — Z1211 Encounter for screening for malignant neoplasm of colon: Secondary | ICD-10-CM

## 2019-03-12 DIAGNOSIS — F418 Other specified anxiety disorders: Secondary | ICD-10-CM

## 2019-03-12 MED ORDER — LISINOPRIL 10 MG PO TABS: 10.0000 mg | ORAL_TABLET | Freq: Every day | ORAL | 1 refills | Status: DC

## 2019-03-12 MED ORDER — CLONIDINE HCL 0.1 MG PO TABS
0.1000 mg | ORAL_TABLET | Freq: Once | ORAL | Status: AC
  Administered 2019-03-12: 0.1 mg via ORAL

## 2019-03-12 MED ORDER — ESCITALOPRAM OXALATE 20 MG PO TABS: 20.0000 mg | ORAL_TABLET | Freq: Every day | ORAL | 1 refills | Status: DC

## 2019-03-12 MED ORDER — ASPIRIN 81 MG PO CHEW: 324.0000 mg | CHEWABLE_TABLET | Freq: Every day | ORAL | 11 refills | Status: DC

## 2019-03-12 MED ORDER — HYDROCHLOROTHIAZIDE 25 MG PO TABS: 25.0000 mg | ORAL_TABLET | Freq: Every day | ORAL | 1 refills | Status: DC

## 2019-03-12 NOTE — Patient Instructions (Signed)

## 2019-03-12 NOTE — Progress Notes (Signed)
Established Patient Office Visit  Subjective:  Patient ID: Casey Warren, male    DOB: 1961-05-05  Age: 58 y.o. MRN: [id number]  CC:  Chief Complaint  Patient presents with  . Hypertension  . Medication Refill    ALL    HPI Casey Warren presents for management of hypertension he denies shortness of breath, headaches, chest pain or lower extremity edema. Patient has been out of medications for 2-3 months and today his blood pressure is elevated 164/113 treated with clonidine in clinic.Requesting refills on all medications.  Past Medical History:  Diagnosis Date  . 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 GERD (gastroesophageal reflux disease)   . History of blood transfusion   . HTN (hypertension)   . Ulcer     Past Surgical History:  Procedure Laterality Date  . CLOSED REDUCTION NASAL FRACTURE  11/19/2011   Procedure: CLOSED REDUCTION NASAL FRACTURE;  Surgeon: Jodi Marble, MD;  Location: [place];  Service: ENT;  Laterality: N/A;  CLOSED REDUCTION NASAL AND NASAL SEPTAL FRACTURE WITH STABILIZATION  . No surgical      Family History  Problem Relation Age of Onset  . Stroke Mother        Died age 34  . Hypertension Mother   . Diabetes Mother   . Breast cancer Mother   . Leukemia Father        Died age 24    Social History   Socioeconomic History  . Marital status: Married    Spouse name: Not on file  . Number of children: 3  . Years of education: Not on file  . Highest education level: Not on file  Occupational History  . Not on file  Social Needs  . Financial resource strain: Not on file  . Food insecurity    Worry: Not on file    Inability: Not on file  . Transportation needs    Medical: Not on file    Non-medical: Not on file  Tobacco Use  . Smoking status: Current Every Day Smoker    Packs/day: 0.50    Types: Cigars, Cigarettes  . Smokeless tobacco: Never Used  . Tobacco comment: 2 black-n-milds   Substance and Sexual Activity  . Alcohol use: Yes     Comment: Two cans of beer dialy.   . Drug use: No  . Sexual activity: Not on file  Lifestyle  . Physical activity    Days per week: Not on file    Minutes per session: Not on file  . Stress: Not on file  Relationships  . Social Herbalist on phone: Not on file    Gets together: Not on file    Attends religious service: Not on file    Active member of club or organization: Not on file    Attends meetings of clubs or organizations: Not on file    Relationship status: Not on file  . Intimate partner violence    Fear of current or ex partner: Not on file    Emotionally abused: Not on file    Physically abused: Not on file    Forced sexual activity: Not on file  Other Topics Concern  . Not on file  Social History Narrative   Lives in 1 story home   Pt and wife live with their daughter, her husband and their 3 children   12 grade education   Works as Designer, jewellery    Outpatient Medications Prior  to Visit  Medication Sig Dispense Refill  . clonazePAM (KLONOPIN) 0.5 MG tablet TAKE 1 TABLET BY MOUTH  DAILY AT BEDTIME (Patient not taking: Reported on 03/12/2019) 30 tablet 0  . fluticasone (FLONASE) 50 MCG/ACT nasal spray Place 2 sprays into both nostrils daily. (Patient not taking: Reported on 03/12/2019) 16 g 6  . aspirin 81 MG chewable tablet Chew 4 tablets (324 mg total) by mouth daily. (Patient not taking: Reported on 03/12/2019) 30 tablet 11  . escitalopram (LEXAPRO) 20 MG tablet Take 1 tablet (20 mg total) by mouth daily. (Patient not taking: Reported on 03/12/2019) 30 tablet 6  . hydrochlorothiazide (HYDRODIURIL) 25 MG tablet Take 1 tablet (25 mg total) by mouth daily. (Patient not taking: Reported on 03/12/2019) 90 tablet 1  . lisinopril (PRINIVIL,ZESTRIL) 10 MG tablet Take 1 tablet (10 mg total) by mouth daily. (Patient not taking: Reported on 03/12/2019) 90 tablet 1   No facility-administered medications prior to visit.     Allergies  Allergen Reactions   . Citrus     ROS Review of Systems  All other systems reviewed and are negative.     Objective:    Physical Exam  Constitutional: He is oriented to person, place, and time. He appears well-developed and well-nourished.  HENT:  Head: Normocephalic.  Eyes: Pupils are equal, round, and reactive to light. EOM are normal.  Neck: Normal range of motion. Neck supple.  Cardiovascular: Normal rate and regular rhythm.  Abdominal: Soft. Bowel sounds are normal. He exhibits distension.  Musculoskeletal: Normal range of motion.  Neurological: He is oriented to person, place, and time.  Skin: Skin is warm and dry.  Psychiatric: He has a normal mood and affect.    BP (!) 164/113 (BP Location: Right Arm, Patient Position: Sitting, Cuff Size: Normal)   Pulse 96   Temp (!) 96 F (35.6 C) (Temporal)   Ht _0  (1.6 m)   Wt 168 lb 12.8 oz (76.6 kg)   SpO2 95%   BMI 29.90 kg/m  Wt Readings from Last 3 Encounters:  03/12/19 168 lb 12.8 oz (76.6 kg)  07/29/18 171 lb 9.6 oz (77.8 kg)  08/28/17 162 lb (73.5 kg)     Health Maintenance Due  Topic Date Due  . TETANUS/TDAP  07/02/1977  . COLONOSCOPY  07/02/2008  . INFLUENZA VACCINE  01/09/2019    There are no preventive care reminders to display for this patient.  Lab Results  Component Value Date   TSH 2.200 02/05/2017   Lab Results  Component Value Date   WBC 4.0 01/13/2017   HGB 15.7 01/13/2017   HCT 44.3 01/13/2017   MCV 88.4 01/13/2017   PLT 232 01/13/2017   Lab Results  Component Value Date   NA 134 (L) 01/13/2017   K 4.3 01/13/2017   CO2 22 01/13/2017   GLUCOSE 97 01/13/2017   BUN 9 01/13/2017   CREATININE 1.12 01/13/2017   BILITOT 0.3 02/05/2017   ALKPHOS 99 02/05/2017   AST 15 02/05/2017   ALT 14 02/05/2017   PROT 7.1 02/05/2017   ALBUMIN 4.2 02/05/2017   CALCIUM 8.7 (L) 01/13/2017   ANIONGAP 9 01/13/2017   Lab Results  Component Value Date   CHOL 162 02/05/2017   Lab Results  Component Value Date    HDL 50 02/05/2017   Lab Results  Component Value Date   LDLCALC 93 02/05/2017   Lab Results  Component Value Date   TRIG 96 02/05/2017   Lab Results  Component  Value Date   CHOLHDL 3.2 02/05/2017   Lab Results  Component Value Date   HGBA1C 5.6 02/05/2017      Assessment & Plan:  Levorn was seen today for hypertension and medication refill.  Diagnoses and all orders for this visit:  Essential hypertension Counseled on blood pressure goal of less than 130/80, low-sodium, DASH diet, medication compliance, 150 minutes of moderate intensity exercise per week. Discussed medication compliance, adverse effects, foods high in sodium and nitrates. Increase risk for heart attack and strokes with elevated blood pressure unmodifable factors race, gender and weight. Modifiable factors smoking and increase risk. -     cloNIDine (CATAPRES) tablet 0.1 mg -     lisinopril (ZESTRIL) 10 MG tablet; Take 1 tablet (10 mg total) by mouth daily. -     hydrochlorothiazide (HYDRODIURIL) 25 MG tablet; Take 1 tablet (25 mg total) by mouth daily. -     CBC with Differential/Platelet; Future -     CMP14+EGFR; Future -     Lipid panel; Future  Need for Tdap vaccination Tdap is recommended every 10 years for adults weekly or primary she gets tetanus..  At least 1 of those doses should be with Tdap in adults age 35 and older who have previously received Tdap.  Recommend by the CDC. -     Tdap vaccine greater than or equal to 7yo IM  Hypertension, unspecified type  Blood pressure is not at goal of less than 130/80, today 164/113 this is related to not having blood pressure medication . We discussed  low-sodium, DASH diet, medication compliance, 150 minutes of moderate intensity exercise per week. Discussed medication compliance, adverse effects. -     lisinopril (ZESTRIL) 10 MG tablet; Take 1 tablet (10 mg total) by mouth daily. -     hydrochlorothiazide (HYDRODIURIL) 25 MG tablet; Take 1 tablet (25 mg  total) by mouth daily.  Medication refill  lisinopril (ZESTRIL) 10 MG tablet; Take 1 tablet (10 mg total) by mouth daily. -     hydrochlorothiazide (HYDRODIURIL) 25 MG tablet; Take 1 tablet (25 mg total) by mouth daily -     aspirin 81 MG chewable tablet; Chew 4 tablets (324 mg total) by mouth daily.  Anxiety with depression Discusses withdrawal and sign when SSRI are stopped abruptly. Depression is when you feel down, blue or sad for at least 2 weeks in a row. You may experience increaset increase in sleeping, eating or  loss of interest in doing things that once gave you pleasure. Feeling worthless, guilty, nervous and low self esteem, avoiding interaction with other people or increase agitation. -     escitalopram (LEXAPRO) 20 MG tablet; Take 1 tablet (20 mg total) by mouth daily.  TOBACCO ABUSE Dicussed the increase risked of heart attack and stroke and Nicotine affect every organ in the body second leading cause of death.  Increased risk for lung cancer and other respiratory diseases recommend cessation.  This will be reminded at each clinical visit. Patient acknowledges the need to stop but stressor cause him to continue.      Meds ordered this encounter  Medications  . cloNIDine (CATAPRES) tablet 0.1 mg  . lisinopril (ZESTRIL) 10 MG tablet    Sig: Take 1 tablet (10 mg total) by mouth daily.    Dispense:  90 tablet    Refill:  1    DX Code Needed  .  . hydrochlorothiazide (HYDRODIURIL) 25 MG tablet    Sig: Take 1 tablet (25 mg total)  by mouth daily.    Dispense:  90 tablet    Refill:  1  . aspirin 81 MG chewable tablet    Sig: Chew 4 tablets (324 mg total) by mouth daily.    Dispense:  30 tablet    Refill:  11  . escitalopram (LEXAPRO) 20 MG tablet    Sig: Take 1 tablet (20 mg total) by mouth daily.    Dispense:  90 tablet    Refill:  1    Follow-up: Return in about 4 weeks (around 04/09/2019) for Blood pressure recheck and fasting labs.    Kerin Perna, NP

## 2019-03-15 ENCOUNTER — Other Ambulatory Visit: Payer: Self-pay

## 2019-03-15 ENCOUNTER — Other Ambulatory Visit (INDEPENDENT_AMBULATORY_CARE_PROVIDER_SITE_OTHER): Payer: Self-pay

## 2019-03-15 DIAGNOSIS — I10 Essential (primary) hypertension: Secondary | ICD-10-CM

## 2019-03-16 LAB — CMP14+EGFR
ALT: 19 IU/L (ref 0–44)
AST: 18 IU/L (ref 0–40)
Albumin/Globulin Ratio: 1.7 (ref 1.2–2.2)
Albumin: 4.5 g/dL (ref 3.8–4.9)
Alkaline Phosphatase: 100 IU/L (ref 39–117)
BUN/Creatinine Ratio: 9 — ABNORMAL LOW (ref 10–24)
BUN: 10 mg/dL (ref 8–27)
Bilirubin Total: 0.5 mg/dL (ref 0.0–1.2)
CO2: 19 mmol/L — ABNORMAL LOW (ref 20–29)
Calcium: 9.7 mg/dL (ref 8.6–10.2)
Chloride: 98 mmol/L (ref 96–106)
Creatinine, Ser: 1.09 mg/dL (ref 0.76–1.27)
GFR calc Af Amer: 85 mL/min/{1.73_m2} (ref >59)
GFR calc non Af Amer: 73 mL/min/{1.73_m2} (ref >59)
Globulin, Total: 2.6 g/dL (ref 1.5–4.5)
Glucose: 138 mg/dL — ABNORMAL HIGH (ref 65–99)
Potassium: 4.2 mmol/L (ref 3.5–5.2)
Sodium: 133 mmol/L — ABNORMAL LOW (ref 134–144)
Total Protein: 7.1 g/dL (ref 6.0–8.5)

## 2019-03-16 LAB — LIPID PANEL
Chol/HDL Ratio: 4.1 ratio (ref 0.0–5.0)
Cholesterol, Total: 214 mg/dL — ABNORMAL HIGH (ref 100–199)
HDL: 52 mg/dL (ref >39)
LDL Chol Calc (NIH): 145 mg/dL — ABNORMAL HIGH (ref 0–99)
Triglycerides: 95 mg/dL (ref 0–149)
VLDL Cholesterol Cal: 17 mg/dL (ref 5–40)

## 2019-03-16 LAB — CBC WITH DIFFERENTIAL/PLATELET
Basophils Absolute: 0 10*3/uL (ref 0.0–0.2)
Basos: 1 %
EOS (ABSOLUTE): 0.1 10*3/uL (ref 0.0–0.4)
Eos: 3 %
Hematocrit: 48 % (ref 37.5–51.0)
Hemoglobin: 17.2 g/dL (ref 13.0–17.7)
Immature Grans (Abs): 0 10*3/uL (ref 0.0–0.1)
Immature Granulocytes: 0 %
Lymphocytes Absolute: 2 10*3/uL (ref 0.7–3.1)
Lymphs: 42 %
MCH: 32.8 pg (ref 26.6–33.0)
MCHC: 35.8 g/dL — ABNORMAL HIGH (ref 31.5–35.7)
MCV: 92 fL (ref 79–97)
Monocytes Absolute: 0.8 10*3/uL (ref 0.1–0.9)
Monocytes: 16 %
Neutrophils Absolute: 1.8 10*3/uL (ref 1.4–7.0)
Neutrophils: 38 %
Platelets: 349 10*3/uL (ref 150–450)
RBC: 5.24 x10E6/uL (ref 4.14–5.80)
RDW: 13.4 % (ref 11.6–15.4)
WBC: 4.7 10*3/uL (ref 3.4–10.8)

## 2019-03-18 ENCOUNTER — Telehealth (INDEPENDENT_AMBULATORY_CARE_PROVIDER_SITE_OTHER): Payer: Self-pay

## 2019-03-18 ENCOUNTER — Other Ambulatory Visit (INDEPENDENT_AMBULATORY_CARE_PROVIDER_SITE_OTHER): Payer: Self-pay | Admitting: Primary Care

## 2019-03-18 MED ORDER — ATORVASTATIN CALCIUM 20 MG PO TABS: 20.0000 mg | ORAL_TABLET | Freq: Every day | ORAL | 3 refills | Status: DC

## 2019-03-18 NOTE — Telephone Encounter (Signed)
Patient is aware that his cholesterol is elevated. Bad cholesterol being elevated can increase risk for stroke and heart attack. Atorvastatin 20 mg sent to pharmacy. Patient aware to take it nightly. Advised patient to decrease the consumption of fatty food, red meat, cheese and milk and increase fiber with whole grains and veggies. Nat Christen, CMA

## 2019-03-18 NOTE — Telephone Encounter (Signed)
-----   Message from Kerin Perna, NP sent at 03/18/2019  9:08 AM EDT ----- Elevated cholesterol and LDL not in range. Your LDL is the bad cholesterol that can lead to heart attack and stroke. To lower your number you can decrease your fatty foods, red meat, cheese, milk and increase fiber like whole grains and veggies. Sent in atorvastatin 20mg  take at bedtime

## 2019-04-12 ENCOUNTER — Other Ambulatory Visit: Payer: Self-pay

## 2019-04-12 ENCOUNTER — Encounter (INDEPENDENT_AMBULATORY_CARE_PROVIDER_SITE_OTHER): Payer: Self-pay | Admitting: Primary Care

## 2019-04-12 ENCOUNTER — Ambulatory Visit (INDEPENDENT_AMBULATORY_CARE_PROVIDER_SITE_OTHER): Payer: Self-pay | Admitting: Primary Care

## 2019-04-12 VITALS — BP 129/89 | HR 100 | Temp 97.1°F | Ht 63.0 in | Wt 164.0 lb

## 2019-04-12 DIAGNOSIS — I10 Essential (primary) hypertension: Secondary | ICD-10-CM

## 2019-04-12 NOTE — Progress Notes (Signed)
  Presents for  hypertension evaluation, on previous visit medication was adjusted to include lisinopril 10mg  and HCTZ 25mg .   Patient is adherence with medications.  Current Medication List Lisinopril 10 mg daily  HCTZ 25 mg daily  Lexapro 20mg  at bedtime  Atorvastatin 20mg  nightly  Past Medical History  Past Medical History:  Diagnosis Date  . 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 GERD (gastroesophageal reflux disease)   . History of blood transfusion   . HTN (hypertension)   . Ulcer    Dietary habits include: decreasing salt in diet wife is making sure Exercise habits include:walks  Family / Social history: mother MI/stroke  ASCVD risk factors include- [place]  O:  Physical Exam Vitals signs reviewed.  Constitutional:      Appearance: Normal appearance.  Neck:     Musculoskeletal: Normal range of motion.  Cardiovascular:     Rate and Rhythm: Normal rate and regular rhythm.  Pulmonary:     Effort: Pulmonary effort is normal.     Breath sounds: Normal breath sounds.  Abdominal:     General: Bowel sounds are normal.  Musculoskeletal: Normal range of motion.  Neurological:     Mental Status: He is alert and oriented to person, place, and time.  Psychiatric:        Mood and Affect: Mood normal.        Behavior: Behavior normal.      Review of Systems  All other systems reviewed and are negative.   Last 3 Office BP readings: BP Readings from Last 3 Encounters:  03/12/19 (!) 150/92  07/29/18 (!) 142/101  08/28/17 (!) 139/98    BMET    Component Value Date/Time   NA 133 (L) 03/15/2019 0836   K 4.2 03/15/2019 0836   CL 98 03/15/2019 0836   CO2 19 (L) 03/15/2019 0836   GLUCOSE 138 (H) 03/15/2019 0836   GLUCOSE 97 01/13/2017 2258   BUN 10 03/15/2019 0836   CREATININE 1.09 03/15/2019 0836   CALCIUM 9.7 03/15/2019 0836   GFRNONAA 73 03/15/2019 0836   GFRAA 85 03/15/2019 0836    Renal function: CrCl cannot be calculated (Patient's most recent lab result is  older than the maximum 21 days allowed.).  Clinical ASCVD: Yes The 10-year ASCVD risk score Mikey Bussing DC Jr., et al., 2013) is: 28.9%   Values used to calculate the score:     Age: 44 years     Sex: Male     Is Non-Hispanic African American: Yes     Diabetic: No     Tobacco smoker: Yes     Systolic Blood Pressure: 703 mmHg     Is BP treated: Yes     HDL Cholesterol: 52 mg/dL     Total Cholesterol: 214 mg/dL   A/P: Hypertension longstanding diagnosed currently lisinopril 10mg , and HCTZ 25mg  dailly  on current medications. BP Goal = 130/80 mmHg.  Patient is at goal for systolic and slightly elevated for diastolic no change at this time.Patient is  adherent with current medications. 129/89  -F/u labs ordered - 79months CBC, CMP, Lipids -Counseled on lifestyle modifications for blood pressure control including reduced dietary sodium, increased exercise, adequate sleep   F/U Clinic Visit in 62months  Patient seen with Juluis Mire, NP

## 2019-04-12 NOTE — Patient Instructions (Signed)

## 2019-04-21 IMAGING — CT CT HEAD W/O CM
4 series · 16 of 47 positions shown, 18 images · non-contrast
Comparison: CT of the head performed 07/08/2016, and MRI of the
brain performed 07/09/2016

CLINICAL DATA: Acute onset of unresponsiveness. Amnesia. Initial
encounter.

EXAM:
CT HEAD WITHOUT CONTRAST
TECHNIQUE: Contiguous axial images were obtained from the base of the skull
through the vertex without intravenous contrast.

[Series 3: head without · axial · non-contrast · 0.44mm/px · z∈[-49,+71]mm · 7 of 34 slices shown, 9 images]
[im 5/34  brain]
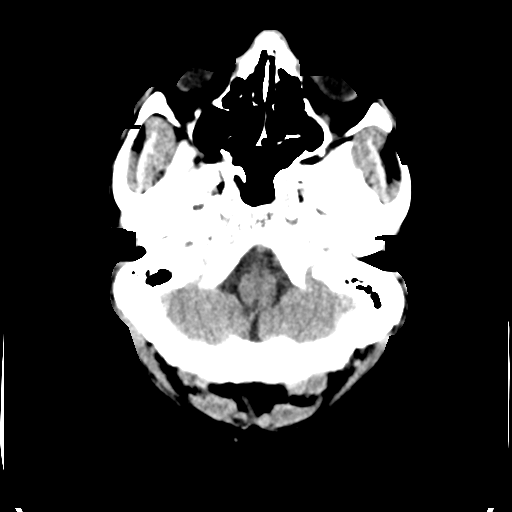
[im 5/34  bone]
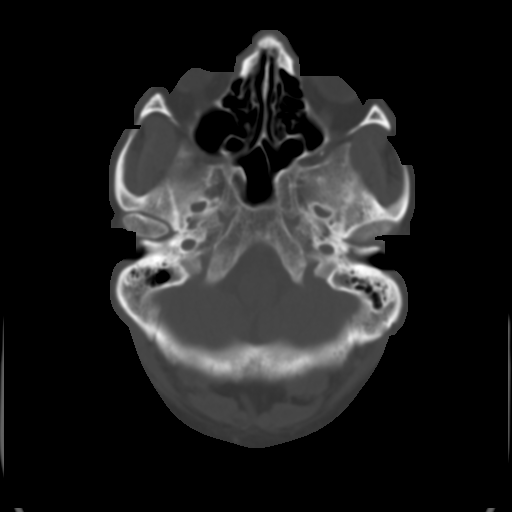
[im 9/34  brain]
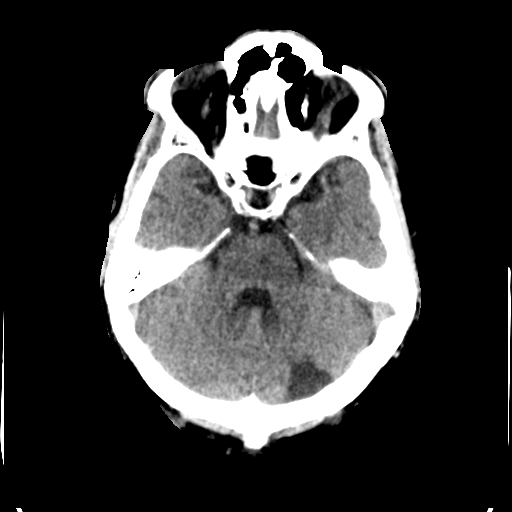
[im 13/34  brain]
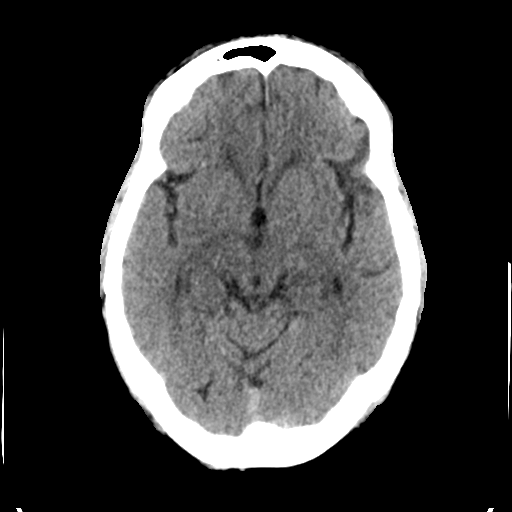
[im 17/34  brain]
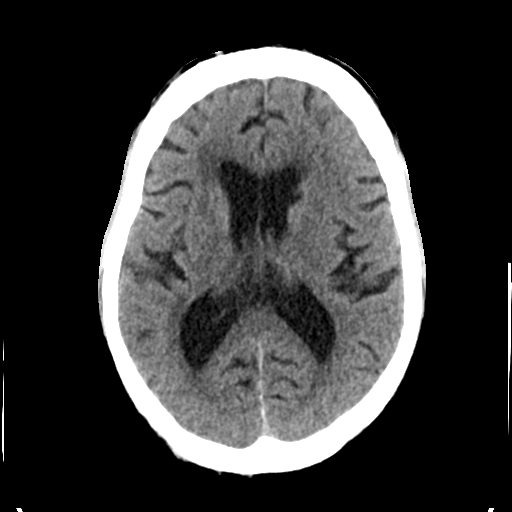
[im 21/34  brain]
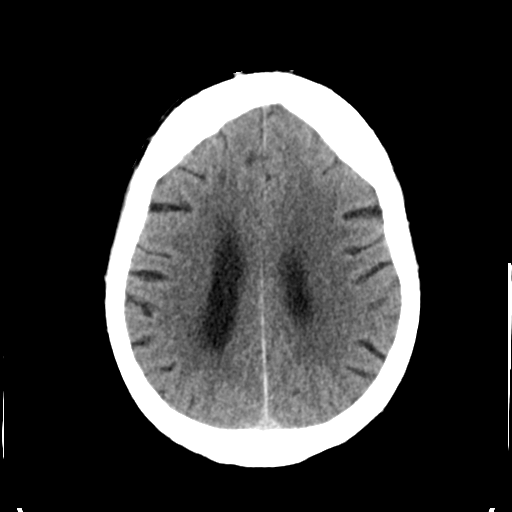
[im 21/34  bone]
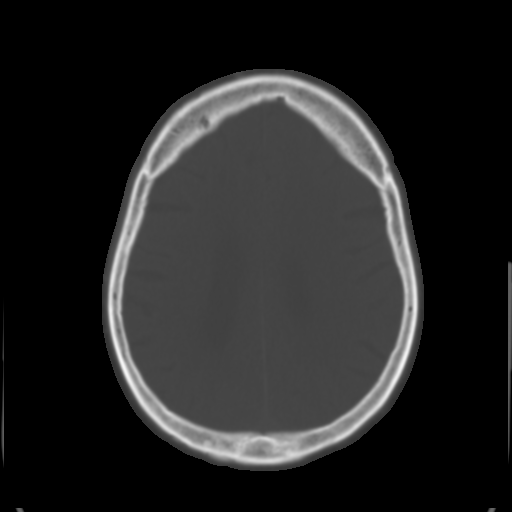
[im 25/34  brain]
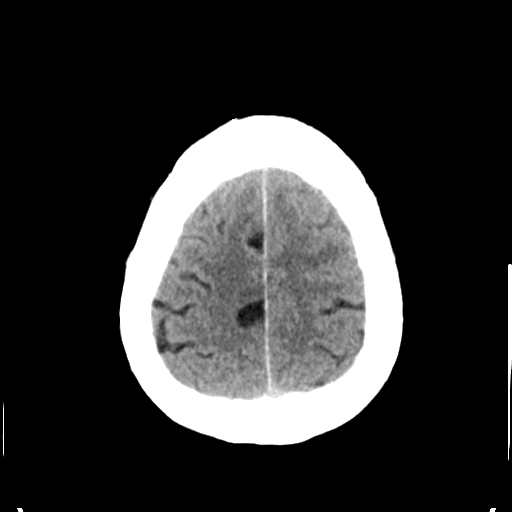
[im 29/34  brain]
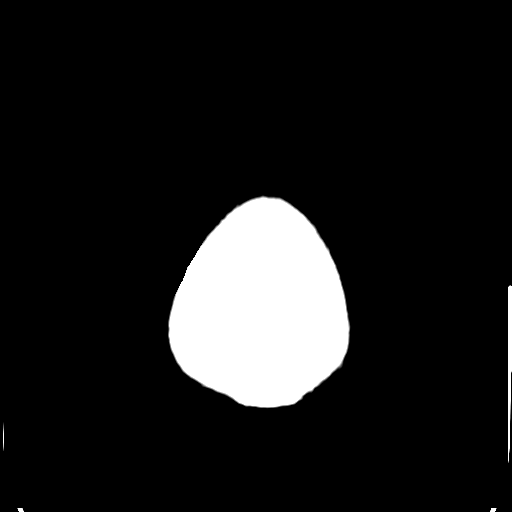

[Series 4: head bone · axial · 0.44mm/px · z∈[-53,-21]mm · 3 of 83 slices shown]
[im 9/83  bone]
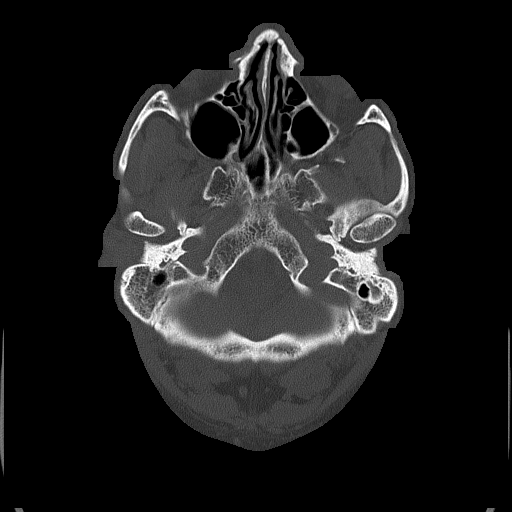
[im 17/83  bone]
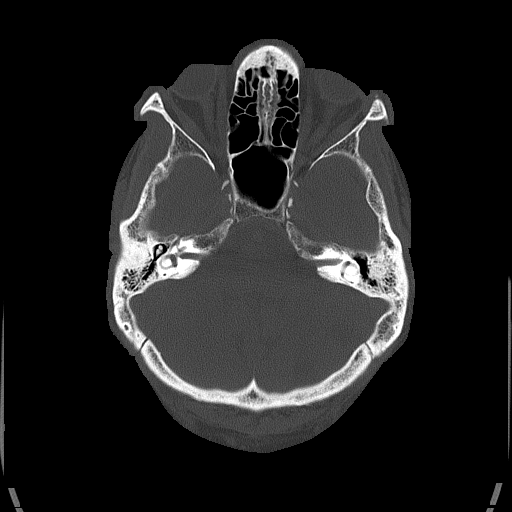
[im 25/83  bone]
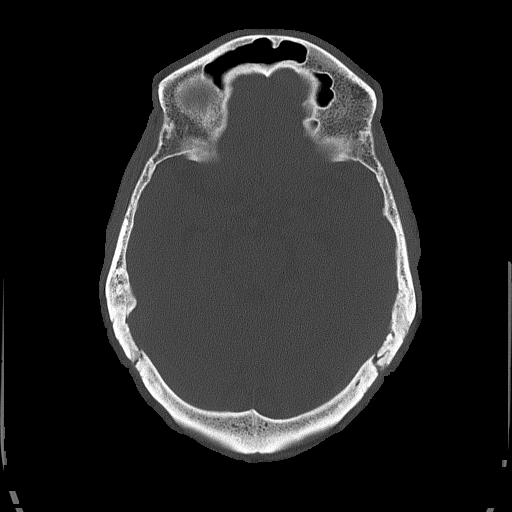

[Series 5: head without cor · coronal · non-contrast · 0.32mm/px · 3 of 72 slices shown]
[im 24/72  brain]
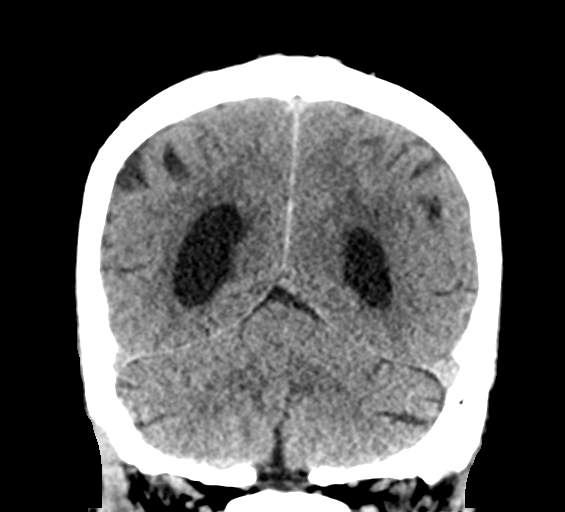
[im 32/72  brain]
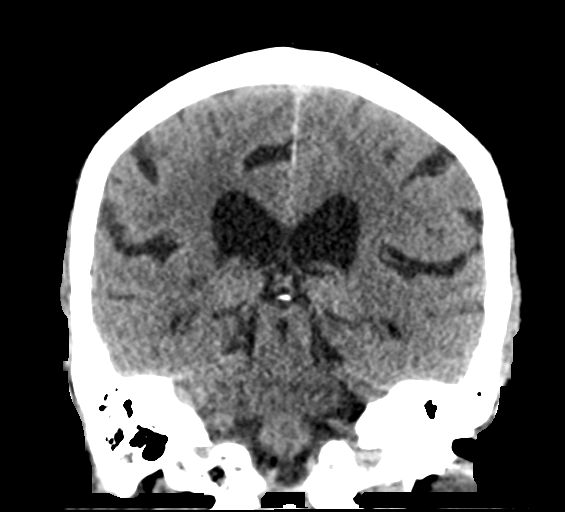
[im 40/72  brain]
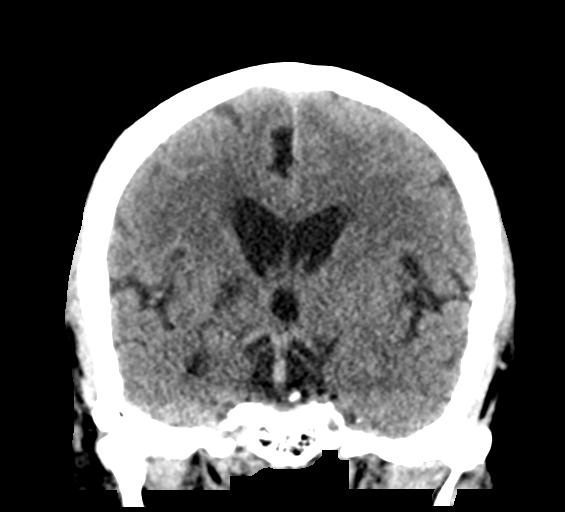

[Series 6: head without sag · sagittal · non-contrast · 0.32mm/px · 3 of 56 slices shown]
[im 19/56  brain]
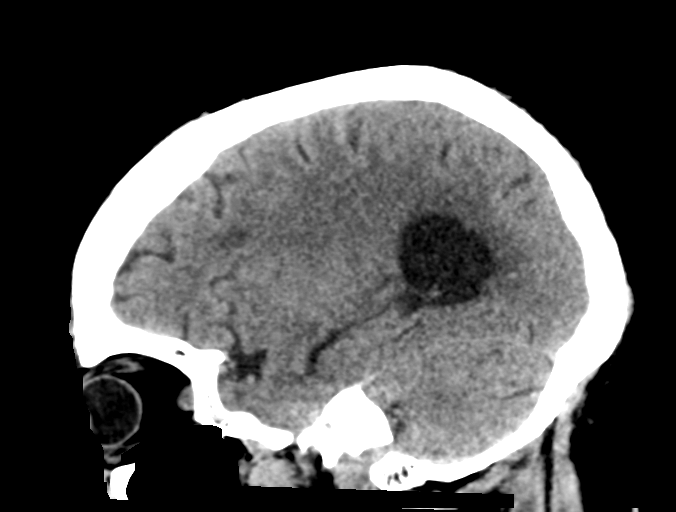
[im 28/56  brain]
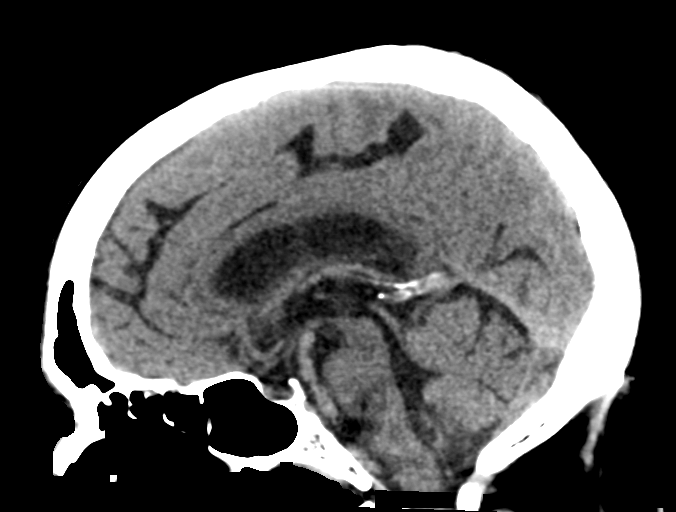
[im 37/56  brain]
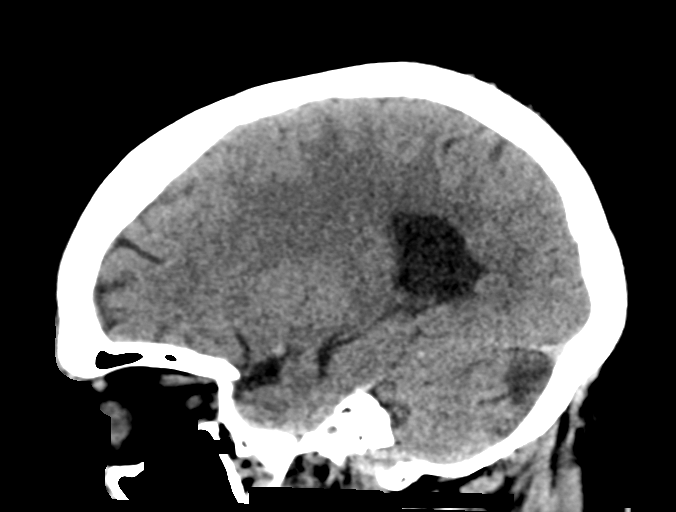

[16 of 47 positions shown; findings below may reference images not displayed]

FINDINGS: Brain: No evidence of acute infarction, hemorrhage, hydrocephalus,
extra-axial collection or mass lesion/mass effect.

Prominence of the ventricles and sulci reflects mild to moderate
cortical volume loss. Chronic lacunar infarcts are seen at the basal
ganglia bilaterally. Scattered periventricular white matter change
likely reflects small vessel ischemic microangiopathy. A small
chronic infarct is noted at the left cerebellar hemisphere.

The brainstem and fourth ventricle are within normal limits. The
cerebral hemispheres demonstrate grossly normal gray-white
differentiation. No mass effect or midline shift is seen.

Vascular: No hyperdense vessel or unexpected calcification.

Skull: There is no evidence of fracture; visualized osseous
structures are unremarkable in appearance.

Sinuses/Orbits: The orbits are within normal limits. The paranasal
sinuses and mastoid air cells are well-aerated.

Other: No significant soft tissue abnormalities are seen.
IMPRESSION: 1. No acute intracranial pathology seen on CT.
2. Mild to moderate cortical volume loss and scattered small vessel
ischemic microangiopathy.
3. Chronic lacunar infarcts at the basal ganglia bilaterally. Small
chronic infarct at the left cerebellar hemisphere.

## 2019-10-11 ENCOUNTER — Encounter (INDEPENDENT_AMBULATORY_CARE_PROVIDER_SITE_OTHER): Payer: Self-pay | Admitting: Primary Care

## 2019-10-11 ENCOUNTER — Ambulatory Visit (INDEPENDENT_AMBULATORY_CARE_PROVIDER_SITE_OTHER): Payer: Self-pay | Admitting: Primary Care

## 2019-10-11 ENCOUNTER — Other Ambulatory Visit: Payer: Self-pay

## 2019-10-11 VITALS — BP 148/99 | HR 86 | Temp 97.7°F | Resp 16 | Wt 177.0 lb

## 2019-10-11 DIAGNOSIS — F418 Other specified anxiety disorders: Secondary | ICD-10-CM

## 2019-10-11 DIAGNOSIS — E782 Mixed hyperlipidemia: Secondary | ICD-10-CM

## 2019-10-11 DIAGNOSIS — Z1211 Encounter for screening for malignant neoplasm of colon: Secondary | ICD-10-CM

## 2019-10-11 DIAGNOSIS — F17200 Nicotine dependence, unspecified, uncomplicated: Secondary | ICD-10-CM

## 2019-10-11 DIAGNOSIS — Z131 Encounter for screening for diabetes mellitus: Secondary | ICD-10-CM

## 2019-10-11 DIAGNOSIS — Z862 Personal history of diseases of the blood and blood-forming organs and certain disorders involving the immune mechanism: Secondary | ICD-10-CM

## 2019-10-11 DIAGNOSIS — I10 Essential (primary) hypertension: Secondary | ICD-10-CM

## 2019-10-11 LAB — POCT GLYCOSYLATED HEMOGLOBIN (HGB A1C): Hemoglobin A1C: 5.5 % (ref 4.0–5.6)

## 2019-10-11 MED ORDER — CHLORTHALIDONE 50 MG PO TABS: 50.0000 mg | ORAL_TABLET | Freq: Every day | ORAL | 1 refills | Status: DC

## 2019-10-11 MED ORDER — ATORVASTATIN CALCIUM 20 MG PO TABS: 20.0000 mg | ORAL_TABLET | Freq: Every day | ORAL | 3 refills | Status: DC

## 2019-10-11 MED ORDER — AMLODIPINE BESYLATE 10 MG PO TABS: 10.0000 mg | ORAL_TABLET | Freq: Every day | ORAL | 3 refills | Status: DC

## 2019-10-11 MED ORDER — LISINOPRIL 20 MG PO TABS: 20.0000 mg | ORAL_TABLET | Freq: Every day | ORAL | 1 refills | Status: DC

## 2019-10-11 MED ORDER — ESCITALOPRAM OXALATE 20 MG PO TABS: 20.0000 mg | ORAL_TABLET | Freq: Every day | ORAL | 1 refills | Status: DC

## 2019-10-11 NOTE — Patient Instructions (Addendum)
THESE ARE YOUR NEED BLOOD PRESSURE MEDICATION TO START TODAY. PLEASE BRING YOUR OLD MEDICATION BACK TO THE OFFICE FOR DISCARD.   Clorathadone 50 mg , amlodipine 10mg  daily increased lisinopri from 10 mg to 20 mg       Stool for Occult Blood Test Why am I having this test? Stool for occult blood, or fecal occult blood test (FOBT), is a test that is used to check for gastrointestinal (GI) bleeding, which may be a sign of colon cancer. This test can also detect small amounts of blood in your stool (feces) from other causes, such as ulcers, bleeding blood vessels, or hemorrhoids. This test may be done as part of an annual routine exam to screen for colorectal cancer. Screening is recommended for all adults starting at age 66 and continuing until age 32. Your health care provider may recommend screening at age 63. What is being tested? This test checks for blood in your stool. What kind of sample is taken?  A stool sample is required for this test. Your health care provider may collect the sample with a swab of the rectum. Or, you may be instructed to collect the sample in a container at home. If you are instructed to collect the sample at home, your health care provider will give you the instructions and supplies that you will need. How do I collect samples at home? You may be asked to collect stool samples at home. Follow instructions from your health care provider about how to collect the samples. When collecting a stool sample at home, make sure you:  Use supplies and instructions that you received from the lab.  Have a bowel movement directly into a clean, dry container. Do not collect stool from the water in the toilet.  Transfer the sample into the germ-free (sterile) cup that you received from the lab.  Do not let any toilet paper or urine get into the cup.  Wash your hands with soap and water after collecting the sample.  Return the samples to the lab as instructed. How do I  prepare for this test?  Do not eat any red meat during the 3 days before your test.  Follow instructions from your health care provider about eating and drinking before the test. Your health care provider may instruct you to avoid other foods or substances. Tell a health care provider about:  All medicines you are taking, including vitamins, herbs, eye drops, creams, and over-the-counter medicines. You may be instructed to avoid certain medicines that are known to interfere with this test.  Any recent dental procedures you have had. How are the results reported? Your test results will be reported as either positive or negative for blood in your stool. What do the results mean? A negative test result means that there is no blood within the stool. A negative result is considered normal. A positive test result may mean that there is blood in the stool. Causes of blood in the stool include:  GI tumors.  Certain GI diseases.  GI trauma or recent surgery.  Hemorrhoids. If your test result is positive, additional tests may be needed to find the source of the bleeding. Talk with your health care provider about what your results mean. Questions to ask your health care provider Ask your health care provider, or the department that is doing the test:  When will my results be ready?  How will I get my results?  What are my treatment options?  What other tests  do I need?  What are my next steps? Summary  Stool for occult blood, or fecal occult blood test (FOBT), is a test that is used to check for gastrointestinal (GI) bleeding, which may be a sign of colon cancer.  This test can also detect small amounts of blood in your stool (feces) from other causes, such as ulcers, bleeding blood vessels, or hemorrhoids.  Your health care provider may collect the sample with a swab of the rectum. Or, you may be instructed to collect the sample in a container at home.  A positive test result may  mean that there is blood in the stool. This information is not intended to replace advice given to you by your health care provider. Make sure you discuss any questions you have with your health care provider. Document Revised: 09/17/2018 Document Reviewed: 01/21/2017 Elsevier Patient Education  2020 [employer].

## 2019-10-11 NOTE — Progress Notes (Signed)
Continues to take medications per patient. He takes medication every morning at 0530. Does not have any concerns today he would like to address with his PCP.

## 2019-10-11 NOTE — Progress Notes (Signed)
Presents for  hypertension evaluation, on previous visit medication was adjusted to include none refills lisinopril 10 mg and HCTZ 25 mg   Patient reports adherence with medications.  Current Medication List Current Outpatient Medications on File Prior to Visit  Medication Sig Dispense Refill  . aspirin 81 MG chewable tablet Chew 4 tablets (324 mg total) by mouth daily. 30 tablet 11  . atorvastatin (LIPITOR) 20 MG tablet Take 1 tablet (20 mg total) by mouth daily. 90 tablet 3  . clonazePAM (KLONOPIN) 0.5 MG tablet TAKE 1 TABLET BY MOUTH  DAILY AT BEDTIME (Patient not taking: Reported on 03/12/2019) 30 tablet 0  . escitalopram (LEXAPRO) 20 MG tablet Take 1 tablet (20 mg total) by mouth daily. 90 tablet 1  . fluticasone (FLONASE) 50 MCG/ACT nasal spray Place 2 sprays into both nostrils daily. (Patient not taking: Reported on 03/12/2019) 16 g 6  . lisinopril (ZESTRIL) 10 MG tablet Take 1 tablet (10 mg total) by mouth daily. 90 tablet 1   No current facility-administered medications on file prior to visit.   Past Medical History  Past Medical History:  Diagnosis Date  . 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 GERD (gastroesophageal reflux disease)   . History of blood transfusion   . HTN (hypertension)   . Ulcer    Dietary habits include: low sodium diet changing and reducing foods high in sodium  Exercise habits include:walking 30 mins 5 times a week  Family / Social history: No  ASCVD risk factors include- [place]  O:  Physical Exam Vitals reviewed.  Constitutional:      Appearance: Normal appearance. He is obese.  HENT:     Head: Normocephalic.  Eyes:     Extraocular Movements: Extraocular movements intact.     Pupils: Pupils are equal, round, and reactive to light.  Cardiovascular:     Rate and Rhythm: Normal rate and regular rhythm.  Pulmonary:     Effort: Pulmonary effort is normal.     Breath sounds: Normal breath sounds.  Abdominal:     General: Abdomen is flat.   Palpations: Abdomen is soft.  Musculoskeletal:        General: Normal range of motion.     Cervical back: Normal range of motion and neck supple.  Skin:    General: Skin is warm and dry.  Neurological:     Mental Status: He is alert and oriented to person, place, and time.  Psychiatric:        Mood and Affect: Mood normal.        Behavior: Behavior normal.        Thought Content: Thought content normal.        Judgment: Judgment normal.      Review of Systems  All other systems reviewed and are negative.   Last 3 Office BP readings: BP Readings from Last 3 Encounters:  10/11/19 (!) 145/97  04/12/19 129/89  03/12/19 (!) 150/92    BMET    Component Value Date/Time   NA 133 (L) 03/15/2019 0836   K 4.2 03/15/2019 0836   CL 98 03/15/2019 0836   CO2 19 (L) 03/15/2019 0836   GLUCOSE 138 (H) 03/15/2019 0836   GLUCOSE 97 01/13/2017 2258   BUN 10 03/15/2019 0836   CREATININE 1.09 03/15/2019 0836   CALCIUM 9.7 03/15/2019 0836   GFRNONAA 73 03/15/2019 0836   GFRAA 85 03/15/2019 0836    Renal function: CrCl cannot be calculated (Patient's most recent lab result  is older than the maximum 21 days allowed.).  Clinical ASCVD: Yes  The 10-year ASCVD risk score Denman George DC Jr., et al., 2013) is: 28.3%   Values used to calculate the score:     Age: 59 years     Sex: Male     Is African American: Yes     Diabetic: No     Tobacco smoker: Yes     Systolic Blood Pressure: 145 mmHg     Is BP treated: Yes     HDL Cholesterol: 52 mg/dL     Total Cholesterol: 214 mg/dL  A/P: Hypertension longstanding diagnosed currently Lisinopril 10 and HCTZ 25mg   on current medications. BP Goal = 130/80 mmHg. Patient is adherent with current medications.  -Started added   Clorathadone 50 mg , and amlodipine 10mg  daily increased lisinopril from 10 mg to 20 mg   -F/u labs ordered - lipids, CMET, CBC , PSA -Counseled on lifestyle modifications for blood pressure control including reduced dietary  sodium, increased exercise, adequate sleep  TOBACCO ABUSE Smokes 4 cigars a day. (4 dollars a day $28 a week)  Discussed the risk of increased risk for lung cancer and other respiratory diseases recommend cessation.  This will be reminded at each clinical visit.  Anxiety with depression Well controlled on Lexapro 20mg  no changes -     escitalopram (LEXAPRO) 20 MG tablet; Take 1 tablet (20 mg total) by mouth daily.  Diabetes mellitus screening -     HgB A1c 5.5  Colon cancer screening -     Fecal occult blood, imunochemical; Future  Other orders -     atorvastatin (LIPITOR) 20 MG tablet; Take 1 tablet (20 mg total) by mouth daily. -     chlorthalidone (HYGROTON) 50 MG tablet; Take 1 tablet (50 mg total) by mouth daily. -     amLODipine (NORVASC) 10 MG tablet; Take 1 tablet (10 mg total) by mouth daily.

## 2019-10-12 ENCOUNTER — Telehealth (INDEPENDENT_AMBULATORY_CARE_PROVIDER_SITE_OTHER): Payer: Self-pay

## 2019-10-12 ENCOUNTER — Other Ambulatory Visit (INDEPENDENT_AMBULATORY_CARE_PROVIDER_SITE_OTHER): Payer: Self-pay | Admitting: Family Medicine

## 2019-10-12 DIAGNOSIS — I10 Essential (primary) hypertension: Secondary | ICD-10-CM

## 2019-10-12 LAB — CBC WITH DIFFERENTIAL/PLATELET
Basophils Absolute: 0 10*3/uL (ref 0.0–0.2)
Basos: 1 %
EOS (ABSOLUTE): 0.1 10*3/uL (ref 0.0–0.4)
Eos: 2 %
Hematocrit: 47 % (ref 37.5–51.0)
Hemoglobin: 16.1 g/dL (ref 13.0–17.7)
Immature Grans (Abs): 0 10*3/uL (ref 0.0–0.1)
Immature Granulocytes: 0 %
Lymphocytes Absolute: 1.3 10*3/uL (ref 0.7–3.1)
Lymphs: 31 %
MCH: 32.5 pg (ref 26.6–33.0)
MCHC: 34.3 g/dL (ref 31.5–35.7)
MCV: 95 fL (ref 79–97)
Monocytes Absolute: 0.5 10*3/uL (ref 0.1–0.9)
Monocytes: 14 %
Neutrophils Absolute: 2.1 10*3/uL (ref 1.4–7.0)
Neutrophils: 52 %
Platelets: 337 10*3/uL (ref 150–450)
RBC: 4.96 x10E6/uL (ref 4.14–5.80)
RDW: 13.7 % (ref 11.6–15.4)
WBC: 4 10*3/uL (ref 3.4–10.8)

## 2019-10-12 LAB — CMP14+EGFR
ALT: 16 IU/L (ref 0–44)
AST: 22 IU/L (ref 0–40)
Albumin/Globulin Ratio: 1.8 (ref 1.2–2.2)
Albumin: 4.7 g/dL (ref 3.8–4.8)
Alkaline Phosphatase: 135 IU/L — ABNORMAL HIGH (ref 39–117)
BUN/Creatinine Ratio: 8 — ABNORMAL LOW (ref 10–24)
BUN: 8 mg/dL (ref 8–27)
Bilirubin Total: 0.5 mg/dL (ref 0.0–1.2)
CO2: 22 mmol/L (ref 20–29)
Calcium: 9.6 mg/dL (ref 8.6–10.2)
Chloride: 99 mmol/L (ref 96–106)
Creatinine, Ser: 1.02 mg/dL (ref 0.76–1.27)
GFR calc Af Amer: 91 mL/min/{1.73_m2} (ref >59)
GFR calc non Af Amer: 79 mL/min/{1.73_m2} (ref >59)
Globulin, Total: 2.6 g/dL (ref 1.5–4.5)
Glucose: 113 mg/dL — ABNORMAL HIGH (ref 65–99)
Potassium: 4.5 mmol/L (ref 3.5–5.2)
Sodium: 139 mmol/L (ref 134–144)
Total Protein: 7.3 g/dL (ref 6.0–8.5)

## 2019-10-12 LAB — LIPID PANEL
Chol/HDL Ratio: 2.7 ratio (ref 0.0–5.0)
Cholesterol, Total: 152 mg/dL (ref 100–199)
HDL: 56 mg/dL (ref >39)
LDL Chol Calc (NIH): 85 mg/dL (ref 0–99)
Triglycerides: 49 mg/dL (ref 0–149)
VLDL Cholesterol Cal: 11 mg/dL (ref 5–40)

## 2019-10-12 MED ORDER — LISINOPRIL 20 MG PO TABS: 20.0000 mg | ORAL_TABLET | Freq: Every day | ORAL | 1 refills | Status: DC

## 2019-10-12 MED ORDER — AMLODIPINE BESYLATE 10 MG PO TABS: 10.0000 mg | ORAL_TABLET | Freq: Every day | ORAL | 3 refills | Status: DC

## 2019-10-12 MED ORDER — CHLORTHALIDONE 50 MG PO TABS: 50.0000 mg | ORAL_TABLET | Freq: Every day | ORAL | 1 refills | Status: DC

## 2019-10-12 MED ORDER — ATORVASTATIN CALCIUM 20 MG PO TABS: 20.0000 mg | ORAL_TABLET | Freq: Every day | ORAL | 3 refills | Status: DC

## 2019-10-12 MED FILL — LISINOPRIL 20 MG TABLET: 20 | 30 days supply | Qty: 30 | Fill #0

## 2019-10-12 MED FILL — AMLODIPINE BESYLATE 10 MG T: 10 | 30 days supply | Qty: 30 | Fill #0

## 2019-10-12 MED FILL — CHLORTHALIDONE 50 MG TABLET: 50 | 30 days supply | Qty: 30 | Fill #0

## 2019-10-12 MED FILL — ATORVASTATIN CALCIUM 20 MG: 20 | 30 days supply | Qty: 30 | Fill #0

## 2019-10-12 NOTE — Addendum Note (Signed)
Addended by: Bing Neighbors on: 10/12/2019 03:47 PM   Modules accepted: Orders

## 2019-10-12 NOTE — Telephone Encounter (Signed)
Patient called to request that medications be sent to [facility] pharmacy as they are too expensive at [facility]. Maryjean Morn, CMA

## 2019-10-12 NOTE — Addendum Note (Signed)
Addended by: Ardeth Sportsman on: 10/12/2019 11:30 AM   Modules accepted: Orders

## 2019-10-12 NOTE — Telephone Encounter (Signed)
Refilled medications from visit yesterday in order for patient to be able to afford them at [facility]

## 2019-10-14 LAB — FECAL OCCULT BLOOD, IMMUNOCHEMICAL: Fecal Occult Bld: NEGATIVE

## 2019-10-27 ENCOUNTER — Telehealth (INDEPENDENT_AMBULATORY_CARE_PROVIDER_SITE_OTHER): Payer: Self-pay

## 2019-10-27 NOTE — Telephone Encounter (Signed)
-----   Message from Grayce Sessions, NP sent at 10/26/2019  5:20 PM EDT ----- All labs were normal FOBT negative

## 2019-10-27 NOTE — Telephone Encounter (Signed)
Patient verified date of birth. He is aware that labs were normal and FIT was negative. He verbalized understanding. Maryjean Morn, CMA

## 2019-11-22 ENCOUNTER — Ambulatory Visit (INDEPENDENT_AMBULATORY_CARE_PROVIDER_SITE_OTHER): Payer: Self-pay | Admitting: Primary Care

## 2019-11-22 ENCOUNTER — Encounter (INDEPENDENT_AMBULATORY_CARE_PROVIDER_SITE_OTHER): Payer: Self-pay | Admitting: Primary Care

## 2019-11-22 ENCOUNTER — Other Ambulatory Visit: Payer: Self-pay

## 2019-11-22 VITALS — BP 99/66 | HR 98 | Temp 98.7°F | Ht 63.0 in | Wt 155.2 lb

## 2019-11-22 DIAGNOSIS — T675XXA Heat exhaustion, unspecified, initial encounter: Secondary | ICD-10-CM

## 2019-11-22 DIAGNOSIS — F17200 Nicotine dependence, unspecified, uncomplicated: Secondary | ICD-10-CM

## 2019-11-22 DIAGNOSIS — E860 Dehydration: Secondary | ICD-10-CM

## 2019-11-22 DIAGNOSIS — I10 Essential (primary) hypertension: Secondary | ICD-10-CM

## 2019-11-22 DIAGNOSIS — F1010 Alcohol abuse, uncomplicated: Secondary | ICD-10-CM

## 2019-11-22 NOTE — Progress Notes (Signed)
Pt states since starting new Bp medication he has been feeling dizzy

## 2019-11-22 NOTE — Progress Notes (Signed)
Established Patient Office Visit  Subjective:  Patient ID: Casey Warren, male    DOB: 1961/02/04  Age: 59 y.o. MRN: [id number]  CC:  Chief Complaint  Patient presents with  . Blood Pressure Check    HPI Mr. KATIE MOCH is a 59 year old male presents for blood pressure follow up. 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 Also notice the dizziness with smoking. Today he is hypotensive without medication food or liquids. Denies shortness of breath, headaches, chest pain or lower extremity edema, sudden onset, vision changes, unilateral weakness and paresthesias  Past Medical History:  Diagnosis Date  . 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 GERD (gastroesophageal reflux disease)   . History of blood transfusion   . HTN (hypertension)   . Ulcer     Past Surgical History:  Procedure Laterality Date  . CLOSED REDUCTION NASAL FRACTURE  11/19/2011   Procedure: CLOSED REDUCTION NASAL FRACTURE;  Surgeon: Flo Shanks, MD;  Location: [facility] OR;  Service: ENT;  Laterality: N/A;  CLOSED REDUCTION NASAL AND NASAL SEPTAL FRACTURE WITH STABILIZATION  . No surgical      Family History  Problem Relation Age of Onset  . Stroke Mother        Died age 50  . Hypertension Mother   . Diabetes Mother   . Breast cancer Mother   . Leukemia Father        Died age 107    Social History   Socioeconomic History  . Marital status: Married    Spouse name: Not on file  . Number of children: 3  . Years of education: Not on file  . Highest education level: Not on file  Occupational History  . Not on file  Tobacco Use  . Smoking status: Current Every Day Smoker    Packs/day: 0.50    Types: Cigars, Cigarettes  . Smokeless tobacco: Never Used  . Tobacco comment: 2 black-n-milds   Vaping Use  . Vaping Use: Never used  Substance and Sexual Activity  . Alcohol use: Yes    Comment: Two cans of beer dialy.   . Drug use: No  . Sexual activity: Not  on file  Other Topics Concern  . 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 Difficulty of Paying Living Expenses:   Food Insecurity:   . Worried About Programme researcher, broadcasting/film/video in the Last Year:   . Barista in the Last Year:   Transportation Needs:   . Freight forwarder (Medical):   Marland Kitchen Lack of Transportation (Non-Medical):   Physical Activity:   . Days of Exercise per Week:   . Minutes of Exercise per Session:   Stress:   . Feeling of Stress :   Social Connections:   . Frequency of Communication with Friends and Family:   . Frequency of Social Gatherings with Friends and Family:   . Attends Religious Services:   . Active Member of Clubs or Organizations:   . Attends Banker Meetings:   Marland Kitchen Marital Status:   Intimate Partner Violence:   . Fear of Current or Ex-Partner:   . Emotionally Abused:   .  Physically Abused:   . Sexually Abused:     Outpatient Medications Prior to Visit  Medication Sig Dispense Refill  . amLODipine (NORVASC) 10 MG tablet Take 1 tablet (10 mg total) by mouth daily. 90 tablet 3  . aspirin 81 MG chewable tablet Chew 4 tablets (324 mg total) by mouth daily. 30 tablet 11  . atorvastatin (LIPITOR) 20 MG tablet Take 1 tablet (20 mg total) by mouth daily. 90 tablet 3  . chlorthalidone (HYGROTON) 50 MG tablet Take 1 tablet (50 mg total) by mouth daily. 90 tablet 1  . clonazePAM (KLONOPIN) 0.5 MG tablet TAKE 1 TABLET BY MOUTH  DAILY AT BEDTIME 30 tablet 0  . escitalopram (LEXAPRO) 20 MG tablet Take 1 tablet (20 mg total) by mouth daily. 90 tablet 1  . fluticasone (FLONASE) 50 MCG/ACT nasal spray Place 2 sprays into both nostrils daily. 16 g 6  . lisinopril (ZESTRIL) 20 MG tablet Take 1 tablet (20 mg total) by mouth daily. 90  tablet 1   No facility-administered medications prior to visit.    Allergies  Allergen Reactions  . Citrus     ROS Review of Systems  All other systems reviewed and are negative.     Objective:    Physical Exam  General: Vital signs reviewed.  Patient is well-developed and well-nourished, in no acute distress and cooperative with exam.  Head: Normocephalic and atraumatic. Eyes: EOMI, conjunctivae normal, no scleral icterus.  Neck: Supple, trachea midline, normal ROM, no JVD, masses, thyromegaly, or carotid bruit present.  Cardiovascular: RRR, S1 normal, S2 normal, no murmurs, gallops, or rubs. Pulmonary/Chest: Clear to auscultation bilaterally, no wheezes, rales, or rhonchi. Abdominal: Soft, non-tender, non-distended, BS +, no masses, organomegaly, or guarding present.  Musculoskeletal: No joint deformities, erythema, or stiffness, ROM full and nontender. Extremities: No lower extremity edema bilaterally,  pulses symmetric and intact bilaterally. No cyanosis or clubbing. Neurological: A&O x3, Strength is normal and symmetric bilaterally, cranial nerve II-XII are grossly intact, no focal motor deficit, sensory intact to light touch bilaterally.  Skin: Warm, dry and intact. No rashes or erythema. Psychiatric: Normal mood and affect. speech and behavior is normal. Cognition and memory are normal.  BP 99/66 (BP Location: Left Arm, Patient Position: Sitting, Cuff Size: Normal)   Pulse 98   Temp 98.7 F (37.1 C) (Oral)   Ht 5\' 3"  (1.6 m)   Wt 155 lb 3.2 oz (70.4 kg)   SpO2 99%   BMI 27.49 kg/m  Wt Readings from Last 3 Encounters:  11/22/19 155 lb 3.2 oz (70.4 kg)  10/11/19 177 lb (80.3 kg)  04/12/19 164 lb (74.4 kg)     Health Maintenance Due  Topic Date Due  . COVID-19 Vaccine (1) Never done    There are no preventive care reminders to display for this patient.  Lab Results  Component Value Date   TSH 2.200 02/05/2017   Lab Results  Component Value Date   WBC  4.0 10/11/2019   HGB 16.1 10/11/2019   HCT 47.0 10/11/2019   MCV 95 10/11/2019   PLT 337 10/11/2019   Lab Results  Component Value Date   NA 139 10/11/2019   K 4.5 10/11/2019   CO2 22 10/11/2019   GLUCOSE 113 (H) 10/11/2019   BUN 8 10/11/2019   CREATININE 1.02 10/11/2019   BILITOT 0.5 10/11/2019   ALKPHOS 135 (H) 10/11/2019   AST 22 10/11/2019   ALT 16 10/11/2019   PROT 7.3 10/11/2019   ALBUMIN 4.7 10/11/2019  CALCIUM 9.6 10/11/2019   ANIONGAP 9 01/13/2017   Lab Results  Component Value Date   CHOL 152 10/11/2019   Lab Results  Component Value Date   HDL 56 10/11/2019   Lab Results  Component Value Date   LDLCALC 85 10/11/2019   Lab Results  Component Value Date   TRIG 49 10/11/2019   Lab Results  Component Value Date   CHOLHDL 2.7 10/11/2019   Lab Results  Component Value Date   HGBA1C 5.5 10/11/2019      Assessment & Plan:  Angie was seen today for blood pressure check.  Diagnoses and all orders for this visit:  Essential hypertension Hypotensive he took his Bp medication at 4 am this morning amlodipine 10 mg, chorthalidone 50 mg and lisinopril continue medication as prescribe after breakfast.  Blood pressure goal of less than 130/80, low-sodium, DASH diet, medication compliance, 150 minutes of moderate intensity exercise per week. Discussed medication compliance, adverse effects. Return for Bp ck   TOBACCO ABUSE He is aware of the increased risk for lung cancer and other respiratory diseases recommend cessation. Also, discussed this can be another contributing factor to dizziness . When he gets over heated he takes a break and goes out for air and a cigarette  This will be reminded at each clinical visit.   Alcohol abuse Down to 2 beers a days. Prefer to have them after work . If this become a problem examples not being able to have a beer physical changes and has to have a beer in the morning will need to discuss AA  Heat exhaustion, initial  encounter Working in a restaurant loading and unloading the dishwasher may also be secondary cause of dizziness continue to take breaks but do not smoke  Dehydration Lips dry, tongue BP low no fluids since yesterday. Increase water 64 oz daily   Follow-up: Return in about 5 weeks (around 12/27/2019) for Bp check.    Grayce Sessions, NP

## 2019-11-22 NOTE — Patient Instructions (Signed)

## 2019-11-30 ENCOUNTER — Emergency Department (HOSPITAL_COMMUNITY): Payer: Self-pay

## 2019-11-30 ENCOUNTER — Inpatient Hospital Stay (HOSPITAL_COMMUNITY)
Admission: EM | Admit: 2019-11-30 | Discharge: 2019-12-02 | DRG: 640 | Disposition: A | Discharge status: Home / Self Care | Payer: Self-pay | Attending: Internal Medicine | Admitting: Internal Medicine

## 2019-11-30 ENCOUNTER — Other Ambulatory Visit: Payer: Self-pay

## 2019-11-30 ENCOUNTER — Encounter (HOSPITAL_COMMUNITY): Payer: Self-pay | Admitting: Emergency Medicine

## 2019-11-30 DIAGNOSIS — Z8249 Family history of ischemic heart disease and other diseases of the circulatory system: Secondary | ICD-10-CM

## 2019-11-30 DIAGNOSIS — Z888 Allergy status to other drugs, medicaments and biological substances status: Secondary | ICD-10-CM

## 2019-11-30 DIAGNOSIS — I10 Essential (primary) hypertension: Secondary | ICD-10-CM | POA: Diagnosis present

## 2019-11-30 DIAGNOSIS — E876 Hypokalemia: Secondary | ICD-10-CM | POA: Diagnosis present

## 2019-11-30 DIAGNOSIS — E871 Hypo-osmolality and hyponatremia: Secondary | ICD-10-CM | POA: Diagnosis present

## 2019-11-30 DIAGNOSIS — Z7982 Long term (current) use of aspirin: Secondary | ICD-10-CM

## 2019-11-30 DIAGNOSIS — Z20822 Contact with and (suspected) exposure to covid-19: Secondary | ICD-10-CM | POA: Diagnosis present

## 2019-11-30 DIAGNOSIS — F419 Anxiety disorder, unspecified: Secondary | ICD-10-CM | POA: Diagnosis present

## 2019-11-30 DIAGNOSIS — E861 Hypovolemia: Principal | ICD-10-CM | POA: Diagnosis present

## 2019-11-30 DIAGNOSIS — N179 Acute kidney failure, unspecified: Secondary | ICD-10-CM | POA: Diagnosis present

## 2019-11-30 DIAGNOSIS — K219 Gastro-esophageal reflux disease without esophagitis: Secondary | ICD-10-CM | POA: Diagnosis present

## 2019-11-30 DIAGNOSIS — F1010 Alcohol abuse, uncomplicated: Secondary | ICD-10-CM | POA: Diagnosis present

## 2019-11-30 DIAGNOSIS — Z79899 Other long term (current) drug therapy: Secondary | ICD-10-CM

## 2019-11-30 DIAGNOSIS — E1165 Type 2 diabetes mellitus with hyperglycemia: Secondary | ICD-10-CM | POA: Diagnosis present

## 2019-11-30 DIAGNOSIS — Z833 Family history of diabetes mellitus: Secondary | ICD-10-CM

## 2019-11-30 DIAGNOSIS — Z91018 Allergy to other foods: Secondary | ICD-10-CM

## 2019-11-30 DIAGNOSIS — R55 Syncope and collapse: Secondary | ICD-10-CM

## 2019-11-30 DIAGNOSIS — R571 Hypovolemic shock: Secondary | ICD-10-CM | POA: Diagnosis present

## 2019-11-30 DIAGNOSIS — R569 Unspecified convulsions: Secondary | ICD-10-CM

## 2019-11-30 DIAGNOSIS — F17290 Nicotine dependence, other tobacco product, uncomplicated: Secondary | ICD-10-CM | POA: Diagnosis present

## 2019-11-30 DIAGNOSIS — I952 Hypotension due to drugs: Secondary | ICD-10-CM | POA: Diagnosis present

## 2019-11-30 LAB — CBC
HCT: 41.8 % (ref 39.0–52.0)
Hemoglobin: 15.1 g/dL (ref 13.0–17.0)
MCH: 32 pg (ref 26.0–34.0)
MCHC: 36.1 g/dL — ABNORMAL HIGH (ref 30.0–36.0)
MCV: 88.6 fL (ref 80.0–100.0)
Platelets: 324 10*3/uL (ref 150–400)
RBC: 4.72 MIL/uL (ref 4.22–5.81)
RDW: 11.5 % (ref 11.5–15.5)
WBC: 7.9 10*3/uL (ref 4.0–10.5)
nRBC: 0 % (ref 0.0–0.2)

## 2019-11-30 LAB — URINALYSIS, ROUTINE W REFLEX MICROSCOPIC
Bacteria, UA: NONE SEEN
Bilirubin Urine: NEGATIVE
Glucose, UA: NEGATIVE mg/dL
Hgb urine dipstick: NEGATIVE
Ketones, ur: 5 mg/dL — AB
Leukocytes,Ua: NEGATIVE
Nitrite: NEGATIVE
Protein, ur: 30 mg/dL — AB
Specific Gravity, Urine: 1.021 (ref 1.005–1.030)
pH: 5 (ref 5.0–8.0)

## 2019-11-30 LAB — BASIC METABOLIC PANEL
Anion gap: 11 (ref 5–15)
BUN: 22 mg/dL (ref 8–23)
CO2: 23 mmol/L (ref 22–32)
Calcium: 9.4 mg/dL (ref 8.9–10.3)
Chloride: 92 mmol/L — ABNORMAL LOW (ref 98–111)
Creatinine, Ser: 2.58 mg/dL — ABNORMAL HIGH (ref 0.61–1.24)
GFR calc Af Amer: 30 mL/min — ABNORMAL LOW (ref >60)
GFR calc non Af Amer: 26 mL/min — ABNORMAL LOW (ref >60)
Glucose, Bld: 139 mg/dL — ABNORMAL HIGH (ref 70–99)
Potassium: 3.3 mmol/L — ABNORMAL LOW (ref 3.5–5.1)
Sodium: 126 mmol/L — ABNORMAL LOW (ref 135–145)

## 2019-11-30 MED ORDER — LORAZEPAM 2 MG/ML IJ SOLN: 1.0000 mg | INTRAMUSCULAR | Status: DC | PRN

## 2019-11-30 MED ORDER — LORAZEPAM 1 MG PO TABS: 1.0000 mg | ORAL_TABLET | ORAL | Status: DC | PRN

## 2019-11-30 MED ORDER — ENOXAPARIN SODIUM 40 MG/0.4ML ~~LOC~~ SOLN
40.0000 mg | SUBCUTANEOUS | Status: DC
  Administered 2019-11-30 – 2019-12-01 (×2): 40 mg via SUBCUTANEOUS
  Filled 2019-11-30 (×2): qty 0.4

## 2019-11-30 MED ORDER — FOLIC ACID 1 MG PO TABS
1.0000 mg | ORAL_TABLET | Freq: Every day | ORAL | Status: DC
  Administered 2019-11-30 – 2019-12-02 (×3): 1 mg via ORAL
  Filled 2019-11-30 (×3): qty 1

## 2019-11-30 MED ORDER — SODIUM CHLORIDE 0.9 % IV BOLUS
1000.0000 mL | Freq: Once | INTRAVENOUS | Status: AC
  Administered 2019-11-30: 1000 mL via INTRAVENOUS

## 2019-11-30 MED ORDER — ACETAMINOPHEN 650 MG RE SUPP: 650.0000 mg | Freq: Four times a day (QID) | RECTAL | Status: DC | PRN

## 2019-11-30 MED ORDER — POTASSIUM CHLORIDE CRYS ER 20 MEQ PO TBCR
20.0000 meq | EXTENDED_RELEASE_TABLET | Freq: Once | ORAL | Status: AC
  Administered 2019-11-30: 20 meq via ORAL
  Filled 2019-11-30: qty 1

## 2019-11-30 MED ORDER — ADULT MULTIVITAMIN W/MINERALS CH
1.0000 | ORAL_TABLET | Freq: Every day | ORAL | Status: DC
  Administered 2019-11-30 – 2019-12-02 (×3): 1 via ORAL
  Filled 2019-11-30 (×3): qty 1

## 2019-11-30 MED ORDER — ATORVASTATIN CALCIUM 10 MG PO TABS
20.0000 mg | ORAL_TABLET | Freq: Every day | ORAL | Status: DC
  Administered 2019-12-01 – 2019-12-02 (×2): 20 mg via ORAL
  Filled 2019-11-30 (×2): qty 2

## 2019-11-30 MED ORDER — ESCITALOPRAM OXALATE 20 MG PO TABS
20.0000 mg | ORAL_TABLET | Freq: Every day | ORAL | Status: DC
  Administered 2019-12-01 – 2019-12-02 (×2): 20 mg via ORAL
  Filled 2019-11-30 (×2): qty 1

## 2019-11-30 MED ORDER — ASPIRIN 81 MG PO CHEW
81.0000 mg | CHEWABLE_TABLET | Freq: Every day | ORAL | Status: DC
  Administered 2019-12-01 – 2019-12-02 (×2): 81 mg via ORAL
  Filled 2019-11-30 (×2): qty 1

## 2019-11-30 MED ORDER — ACETAMINOPHEN 325 MG PO TABS: 650.0000 mg | ORAL_TABLET | Freq: Four times a day (QID) | ORAL | Status: DC | PRN

## 2019-11-30 MED ORDER — ONDANSETRON HCL 4 MG/2ML IJ SOLN: 4.0000 mg | Freq: Four times a day (QID) | INTRAMUSCULAR | Status: DC | PRN

## 2019-11-30 MED ORDER — THIAMINE HCL 100 MG/ML IJ SOLN: 100.0000 mg | Freq: Every day | INTRAMUSCULAR | Status: DC

## 2019-11-30 MED ORDER — THIAMINE HCL 100 MG PO TABS
100.0000 mg | ORAL_TABLET | Freq: Every day | ORAL | Status: DC
  Administered 2019-11-30 – 2019-12-02 (×3): 100 mg via ORAL
  Filled 2019-11-30 (×3): qty 1

## 2019-11-30 MED ORDER — SODIUM CHLORIDE 0.9% FLUSH: 3.0000 mL | Freq: Once | INTRAVENOUS | Status: DC

## 2019-11-30 MED ORDER — ONDANSETRON HCL 4 MG PO TABS: 4.0000 mg | ORAL_TABLET | Freq: Four times a day (QID) | ORAL | Status: DC | PRN

## 2019-11-30 NOTE — ED Provider Notes (Signed)
[facility] EMERGENCY DEPARTMENT Provider Note   CSN: [id number] Arrival date & time: 11/30/19  1739     History Chief Complaint  Patient presents with  . Dizziness    Casey Warren is a 59 y.o. male.  Patient is a 59 year old male presenting to the ED with several days of dizziness, symptomatic hypotension  in setting of recent addition of thiazide diuretic by primary care doctor approximately 3-1/2 weeks ago.  Additionally patient is not very forthcoming with his history the patient's wife is at bedside and extremely helpful stating that patient had 2 seizure-like episodes over the last 24 hours of the patient's eyes rolling back in his head with 1 hour of postictal period where the patient slept.  No other seizure activity description was reported.  Patient has a history of seizures a year ago but is not on any antiepileptics.  ABCs intact on arrival.  No seizure activity present in the ED.  Patient denies any significant concerns besides dizziness.  Patient had to leave work due to his dizziness.  Patient's wife also endorses some mental fogginess.  The history is provided by the patient and medical records. The history is limited by the condition of the patient.  Illness Location:  Constitutional Quality:  Lightheaded, dizzy, hypotensive Severity:  Moderate Onset quality:  Gradual Timing:  Constant Progression:  Worsening Chronicity:  New Context:  Patient has been peeing a lot due to his thiazide diuretic, has had hypotension, seeing PCP for some adjustment of medications but patient unsure which ones he was supposed to stop, patient also endorses some seizure activity Relieved by:  Nothing Worsened by:  Likely diuretics Ineffective treatments:  Current medication regime Associated symptoms: fatigue and loss of consciousness   Associated symptoms: no abdominal pain, no chest pain, no congestion, no cough, no nausea, no shortness of breath and no vomiting         Past Medical History:  Diagnosis Date  . 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 GERD (gastroesophageal reflux disease)   . History of blood transfusion   . HTN (hypertension)   . Ulcer     Patient Active Problem List   Diagnosis Date Noted  . Hyponatremia 11/30/2019  . AKI (acute kidney injury) (HCC) 11/30/2019  . Hypotension due to medication 11/30/2019  . Seizures (HCC) 11/30/2019  . Abnormal EKG 04/06/2017  . Essential hypertension 04/06/2017  . ANEMIA 04/28/2008  . Alcohol abuse 04/28/2008  . TOBACCO ABUSE 04/28/2008  . SUBSTANCE ABUSE, MULTIPLE 04/28/2008  . Dental caries 04/28/2008  . GERD 04/28/2008  . PEPTIC ULCER DISEASE, HELICOBACTER PYLORI POSITIVE 04/28/2008    Past Surgical History:  Procedure Laterality Date  . CLOSED REDUCTION NASAL FRACTURE  11/19/2011   Procedure: CLOSED REDUCTION NASAL FRACTURE;  Surgeon: Flo Shanks, MD;  Location: [facility] OR;  Service: ENT;  Laterality: N/A;  CLOSED REDUCTION NASAL AND NASAL SEPTAL FRACTURE WITH STABILIZATION  . No surgical         Family History  Problem Relation Age of Onset  . Stroke Mother        Died age 62  . Hypertension Mother   . Diabetes Mother   . Breast cancer Mother   . Leukemia Father        Died age 15    Social History   Tobacco Use  . Smoking status: Current Every Day Smoker    Packs/day: 0.50    Types: Cigars, Cigarettes  . Smokeless tobacco: Never Used  .  Tobacco comment: 2 black-n-milds   Vaping Use  . Vaping Use: Never used  Substance Use Topics  . Alcohol use: Yes    Comment: Two cans of beer dialy.   . Drug use: No    Home Medications Prior to Admission medications   Medication Sig Start Date End Date Taking? Authorizing Provider  acetaminophen (TYLENOL) 325 MG tablet Take 650 mg by mouth every 6 (six) hours as needed for mild pain.   Yes [provider]  amLODipine (NORVASC) 10 MG tablet Take 1 tablet (10 mg total) by mouth daily. 10/12/19  Yes Bing Neighbors,  FNP  aspirin 81 MG chewable tablet Chew 4 tablets (324 mg total) by mouth daily. Patient taking differently: Chew 81 mg by mouth daily.  03/12/19  Yes Grayce Sessions, NP  atorvastatin (LIPITOR) 20 MG tablet Take 1 tablet (20 mg total) by mouth daily. 10/12/19  Yes Bing Neighbors, FNP  escitalopram (LEXAPRO) 20 MG tablet Take 1 tablet (20 mg total) by mouth daily. 10/11/19  Yes Grayce Sessions, NP  lisinopril (ZESTRIL) 20 MG tablet Take 1 tablet (20 mg total) by mouth daily. 10/12/19  Yes Bing Neighbors, FNP    Allergies    Citrus and Chlorthalidone  Review of Systems   Review of Systems  Constitutional: Positive for fatigue.  HENT: Negative for congestion.   Respiratory: Negative for cough and shortness of breath.   Cardiovascular: Negative for chest pain.  Gastrointestinal: Negative for abdominal pain, nausea and vomiting.  Neurological: Positive for dizziness, seizures, loss of consciousness and light-headedness.  Psychiatric/Behavioral: Positive for confusion.  All other systems reviewed and are negative.   Physical Exam Updated Vital Signs BP 94/64 (BP Location: Right Arm)   Pulse 94   Temp 98.2 F (36.8 C) (Oral)   Resp 18   Wt 67.5 kg   SpO2 100%   BMI 26.36 kg/m   Physical Exam Vitals and nursing note reviewed.  Constitutional:      General: He is not in acute distress.    Appearance: He is well-developed and normal weight.  HENT:     Head: Normocephalic and atraumatic.     Right Ear: External ear normal.     Left Ear: External ear normal.     Nose: Nose normal.     Mouth/Throat:     Mouth: Mucous membranes are moist.  Eyes:     Extraocular Movements: Extraocular movements intact.     Conjunctiva/sclera: Conjunctivae normal.     Pupils: Pupils are equal, round, and reactive to light.  Cardiovascular:     Rate and Rhythm: Normal rate and regular rhythm.     Heart sounds: No murmur heard.   Pulmonary:     Effort: Pulmonary effort is normal. No  respiratory distress.     Breath sounds: Normal breath sounds.  Abdominal:     Palpations: Abdomen is soft.     Tenderness: There is no abdominal tenderness.  Musculoskeletal:        General: Normal range of motion.     Cervical back: Neck supple.  Skin:    General: Skin is warm and dry.  Neurological:     General: No focal deficit present.     Mental Status: He is alert and oriented to person, place, and time.     Cranial Nerves: No cranial nerve deficit.     Motor: No weakness.  Psychiatric:        Mood and Affect: Mood normal.  Behavior: Behavior normal.     ED Results / Procedures / Treatments   Labs (all labs ordered are listed, but only abnormal results are displayed) Labs Reviewed  BASIC METABOLIC PANEL - Abnormal; Notable for the following components:      Result Value   Sodium 126 (*)    Potassium 3.3 (*)    Chloride 92 (*)    Glucose, Bld 139 (*)    Creatinine, Ser 2.58 (*)    GFR calc non Af Amer 26 (*)    GFR calc Af Amer 30 (*)    All other components within normal limits  CBC - Abnormal; Notable for the following components:   MCHC 36.1 (*)    All other components within normal limits  URINALYSIS, ROUTINE W REFLEX MICROSCOPIC - Abnormal; Notable for the following components:   Color, Urine AMBER (*)    APPearance CLOUDY (*)    Ketones, ur 5 (*)    Protein, ur 30 (*)    All other components within normal limits  SARS CORONAVIRUS 2 BY RT PCR (HOSPITAL ORDER, [place] [facility])    EKG None  Radiology CT Head Wo Contrast  Result Date: 11/30/2019 CLINICAL DATA:  Dizziness for 1 month.  Possible seizure EXAM: CT HEAD WITHOUT CONTRAST TECHNIQUE: Contiguous axial images were obtained from the base of the skull through the vertex without intravenous contrast. COMPARISON:  01/13/2017 FINDINGS: Brain: Remote left cerebellar infarct. Hypodensity in the right cerebellum is new from 01/13/2017 but less specific temporally, and could  conceivably represent a late subacute cerebellar infarct on the right. Hypodense lesions in the pons compatible with small lacunar infarcts. Lacunar infarct of the right lentiform nucleus probably extending into the genu of the right internal capsule, stable. Small lacunar infarct of the head of the left caudate nucleus, stable. Periventricular white matter and corona radiata hypodensities favor chronic ischemic microvascular white matter disease. Localized sulcal prominence medially along the right frontal lobe, unchanged. No intracranial hemorrhage or mass lesion identified. Vascular: There is atherosclerotic calcification of the cavernous carotid arteries bilaterally. Skull: Unremarkable Sinuses/Orbits: Chronic right maxillary sinusitis. Other: No supplemental non-categorized findings. IMPRESSION: 1. Hypodensity in the right cerebellum is new from 01/13/2017 and could represent a chronic or late subacute infarct. 2. Remote left cerebellar infarct. Remote lacunar infarcts in the pons, and bilateral basal ganglia. 3. Periventricular white matter and corona radiata hypodensities favor chronic ischemic microvascular white matter disease. 4. Chronic right maxillary sinusitis. Electronically Signed   By: Van Clines M.D.   On: 11/30/2019 21:09    Procedures Procedures (including critical care time)  Medications Ordered in ED Medications  sodium chloride 0.9 % bolus 1,000 mL (0 mLs Intravenous Stopped 11/30/19 2353)    ED Course  I have reviewed the triage vital signs and the nursing notes.  Pertinent labs & imaging results that were available during my care of the patient were reviewed by me and considered in my medical decision making (see chart for details).    MDM Rules/Calculators/A&P                          Differential diagnosis: Symptomatic hyponatremia, seizure disorder, AKI, hypotension, dehydration  ED physician interpretation of imaging: CT head without acute head bleed or  ischemia ED physician interpretation of labs: Patient labs with significant hyponatremia lab work less than 4 weeks ago.  AKI Baseline creatinine clearance change.  Mild hypokalemia.  No anemia.  MDM: Patient  is a 59 year old male presenting to the ED with hypotension, generalized fatigue, seizure activity found to have abnormalities including hyponatremia, AKI likely due to diuretic prescription found to have symptomatic hyponatremia, acute kidney injury requiring hospitalization for further treatment and evaluation.  Patient's vital signs are stable, patient has mild hypotension but maps are greater than 65.  Patient is afebrile.  Blood pressure appear to improve with 1 L fluid bolus.  Patient seizures may be due to hyponatremia but not present in the ED, no indication for hypertonic saline.  Neurology made aware of the patient and they will evaluate provide recommendations.  Patient has multiple chronic CT head findings but does not appear to have any acute stroke findings on CT.  Patient's neuro exam without concerning findings for stroke.  Hospitalist service contacted for admission.  Signout given patient admitted to the hospital.  Diagnosis, treatment and plan of care was discussed and agreed upon with patient.  Patient comfortable with admission at this time.  Final Clinical Impression(s) / ED Diagnoses Final diagnoses:  Hyponatremia  Hypokalemia  Near syncope  Seizure (HCC)  AKI (acute kidney injury) [facility])    Rx / DC Orders ED Discharge Orders    None       Janeece Fitting, MD 12/01/19 1151    Derwood Kaplan, MD 12/01/19 1839

## 2019-11-30 NOTE — ED Triage Notes (Addendum)
Patient arrives to ED by POV with complaints of worsening dizziness for the past two days. Patient is hypotensive in triage. No other complaints, no complaints of pain. Patient states his MD was supposed to take him off some his BP meds due to his dizziness and hypotension.

## 2019-11-30 NOTE — H&P (Signed)
History and Physical    Casey Warren TGG:[id number] DOB: 1960-12-10 DOA: 11/30/2019  PCP: Grayce Sessions, NP  Patient coming from: Home  I have personally briefly reviewed patient's old medical records in [facility]  Chief Complaint: Dizziness  HPI: Casey Warren is a 59 y.o. male with medical history significant of HTN, prior EtOH and substance abuse, now just drinks 2 beers a day.  Pt has had seizures in past, most recently 1+ year ago, not on any chronic seizure meds.  Pt with lightheadedness / dizziness since starting a new BP med a couple of weeks ago.  Saw PCP, was apparently supposed to stop one / some of his BP meds after office visit on 6/14 but unsure which so kept taking them all.  Yesterday and earlier today had seizures.  Now in to ED.   ED Course: Sodium 126 down from 139 last month, creat 2.58 up from 1.0 last month.  K 3.3.  Initial BP in the ED was recorded as low as 72/58.  Now 96/69.  Getting 1L NS bolus.   Review of Systems: As per HPI, otherwise all review of systems negative.  Past Medical History:  Diagnosis Date  . 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 GERD (gastroesophageal reflux disease)   . History of blood transfusion   . HTN (hypertension)   . Ulcer     Past Surgical History:  Procedure Laterality Date  . CLOSED REDUCTION NASAL FRACTURE  11/19/2011   Procedure: CLOSED REDUCTION NASAL FRACTURE;  Surgeon: Flo Shanks, MD;  Location: [facility] OR;  Service: ENT;  Laterality: N/A;  CLOSED REDUCTION NASAL AND NASAL SEPTAL FRACTURE WITH STABILIZATION  . No surgical       reports that he has been smoking cigars and cigarettes. He has been smoking about 0.50 packs per day. He has never used smokeless tobacco. He reports current alcohol use. He reports that he does not use drugs.  Allergies  Allergen Reactions  . Citrus   . Chlorthalidone     Hyponatremia    Family History  Problem Relation Age of Onset  . Stroke Mother         Died age 64  . Hypertension Mother   . Diabetes Mother   . Breast cancer Mother   . Leukemia Father        Died age 68     Prior to Admission medications   Medication Sig Start Date End Date Taking? Authorizing Provider  acetaminophen (TYLENOL) 325 MG tablet Take 650 mg by mouth every 6 (six) hours as needed for mild pain.   Yes [provider]  amLODipine (NORVASC) 10 MG tablet Take 1 tablet (10 mg total) by mouth daily. 10/12/19  Yes Bing Neighbors, FNP  aspirin 81 MG chewable tablet Chew 4 tablets (324 mg total) by mouth daily. Patient taking differently: Chew 81 mg by mouth daily.  03/12/19  Yes Grayce Sessions, NP  atorvastatin (LIPITOR) 20 MG tablet Take 1 tablet (20 mg total) by mouth daily. 10/12/19  Yes Bing Neighbors, FNP  escitalopram (LEXAPRO) 20 MG tablet Take 1 tablet (20 mg total) by mouth daily. 10/11/19  Yes Grayce Sessions, NP  lisinopril (ZESTRIL) 20 MG tablet Take 1 tablet (20 mg total) by mouth daily. 10/12/19  Yes Bing Neighbors, FNP    Physical Exam: Vitals:   11/30/19 1936 11/30/19 1945 11/30/19 2015 11/30/19 2030  BP: 97/74 91/66 93/72  96/69  Pulse:  82 83 85  Resp: 15 12 20 17   Temp:      TempSrc:      SpO2:  96% 98% 96%    Constitutional: NAD, calm, comfortable Eyes: PERRL, lids and conjunctivae normal ENMT: Mucous membranes are moist. Posterior pharynx clear of any exudate or lesions.Normal dentition.  Neck: normal, supple, no masses, no thyromegaly Respiratory: clear to auscultation bilaterally, no wheezing, no crackles. Normal respiratory effort. No accessory muscle use.  Cardiovascular: Regular rate and rhythm, no murmurs / rubs / gallops. No extremity edema. 2+ pedal pulses. No carotid bruits.  Abdomen: no tenderness, no masses palpated. No hepatosplenomegaly. Bowel sounds positive.  Musculoskeletal: no clubbing / cyanosis. No joint deformity upper and lower extremities. Good ROM, no contractures. Normal muscle tone.  Skin: no  rashes, lesions, ulcers. No induration Neurologic: CN 2-12 grossly intact. Sensation intact, DTR normal. Strength 5/5 in all 4.  Psychiatric: Normal judgment and insight. Alert and oriented x 3. Normal mood.    Labs on Admission: I have personally reviewed following labs and imaging studies  CBC: Recent Labs  Lab 11/30/19 1821  WBC 7.9  HGB 15.1  HCT 41.8  MCV 88.6  PLT 324   Basic Metabolic Panel: Recent Labs  Lab 11/30/19 1821  NA 126*  K 3.3*  CL 92*  CO2 23  GLUCOSE 139*  BUN 22  CREATININE 2.58*  CALCIUM 9.4   GFR: Estimated Creatinine Clearance: 26.5 mL/min (A) (by C-G formula based on SCr of 2.58 mg/dL (H)). Liver Function Tests: No results for input(s): AST, ALT, ALKPHOS, BILITOT, PROT, ALBUMIN in the last 168 hours. No results for input(s): LIPASE, AMYLASE in the last 168 hours. No results for input(s): AMMONIA in the last 168 hours. Coagulation Profile: No results for input(s): INR, PROTIME in the last 168 hours. Cardiac Enzymes: No results for input(s): CKTOTAL, CKMB, CKMBINDEX, TROPONINI in the last 168 hours. BNP (last 3 results) No results for input(s): PROBNP in the last 8760 hours. HbA1C: No results for input(s): HGBA1C in the last 72 hours. CBG: No results for input(s): GLUCAP in the last 168 hours. Lipid Profile: No results for input(s): CHOL, HDL, LDLCALC, TRIG, CHOLHDL, LDLDIRECT in the last 72 hours. Thyroid Function Tests: No results for input(s): TSH, T4TOTAL, FREET4, T3FREE, THYROIDAB in the last 72 hours. Anemia Panel: No results for input(s): VITAMINB12, FOLATE, FERRITIN, TIBC, IRON, RETICCTPCT in the last 72 hours. Urine analysis:    Component Value Date/Time   COLORURINE YELLOW 01/13/2017 2354   APPEARANCEUR CLEAR 01/13/2017 2354   LABSPEC 1.015 01/13/2017 2354   PHURINE 5.0 01/13/2017 2354   GLUCOSEU NEGATIVE 01/13/2017 2354   HGBUR NEGATIVE 01/13/2017 2354   BILIRUBINUR NEGATIVE 01/13/2017 2354   KETONESUR NEGATIVE 01/13/2017  2354   PROTEINUR NEGATIVE 01/13/2017 2354   UROBILINOGEN 0.2 02/09/2008 0800   NITRITE NEGATIVE 01/13/2017 2354   LEUKOCYTESUR NEGATIVE 01/13/2017 2354    Radiological Exams on Admission: No results found.  EKG: Independently reviewed.  Assessment/Plan Principal Problem:   AKI (acute kidney injury) (HCC) Active Problems:   Essential hypertension   Hyponatremia   Hypotension due to medication   Seizures (HCC)    1. Hypotension due to medications - 1. Hold all BP meds 2. Specifically hold amlodipine and lisinopril, stopping chlorthalidone 2. AKI - 1. Due to hypotension / nephrotoxic meds 2. Strict intake and output 3. Follow BMPs 3. Seizures - 1. Underlying seizures likely unmasked by hyponatremia 2. No seizures since arrival to ED, no AMS at this time,  dont think he needs hypertonic saline at this time. 3. Adding chlorthalidone to his allergies list and discontinuing it.  Let wife know not to give chlorthalidone again. 4. Seizure precautions 5. Tele monitor 6. Check Mg 7. CT head pending 4. Hyponatremia - 1. See above regarding chlorthalidone as likely cause, dont think he needs hypertonic saline at this time since no seizures since arrival to ED, normal mental status, etc. 2. 1L NS in ED 3. Regular diet 4. BMP Q6H to trend 5. H/o EtOH abuse - 1. Now drinks just 2 beers daily 2. Will put on CIWA just-in-case  DVT prophylaxis: Lovenox Code Status: Full Family Communication: Wife at bedside Disposition Plan: Home after AKI and hyponatremia resolved Consults called: EDP spoke with neuro Admission status: Admit to inpatient  Severity of Illness: The appropriate patient status for this patient is INPATIENT. Inpatient status is judged to be reasonable and necessary in order to provide the required intensity of service to ensure the patient's safety. The patient's presenting symptoms, physical exam findings, and initial radiographic and laboratory data in the context of  their chronic comorbidities is felt to place them at high risk for further clinical deterioration. Furthermore, it is not anticipated that the patient will be medically stable for discharge from the hospital within 2 midnights of admission. The following factors support the patient status of inpatient.   IP status due to hypotension, AKI with creat 2.5 up from 1.0 baseline, hyponatremia and seizures.   * I certify that at the point of admission it is my clinical judgment that the patient will require inpatient hospital care spanning beyond 2 midnights from the point of admission due to high intensity of service, high risk for further deterioration and high frequency of surveillance required.*    Thos Matsumoto M. DO [facility]  How to contact the [facility] Attending or Consulting provider [place] or covering provider during after hours [place], for this patient?  1. Check the care team in [facility] and look for a) attending/consulting [facility] provider listed and b) the [facility] team listed 2. Log into [email or web address]  Amion Physician Scheduling and messaging for groups and whole hospitals  On call and physician scheduling software for group practices, residents, hospitalists and other medical providers for call, clinic, rotation and shift schedules. On[employer] is a hospital-wide system for scheduling doctors and paging doctors on call. EasyPlot is for scientific plotting and data analysis.  [email or web address]  and use [place]'s universal password to access. If you do not have the password, please contact the hospital operator.  3. Locate the [facility] provider you are looking for under [facility] and page to a number that you can be directly reached. 4. If you still have difficulty reaching the provider, please page the [facility] (Director on Call) for the Hospitalists listed on amion for assistance.  11/30/2019, 8:55 PM

## 2019-11-30 NOTE — ED Notes (Signed)
Patient transported to CT 

## 2019-12-01 LAB — BASIC METABOLIC PANEL
Anion gap: 14 (ref 5–15)
Anion gap: 12 (ref 5–15)
BUN: 23 mg/dL (ref 8–23)
BUN: 22 mg/dL (ref 8–23)
CO2: 22 mmol/L (ref 22–32)
CO2: 23 mmol/L (ref 22–32)
Calcium: 9 mg/dL (ref 8.9–10.3)
Calcium: 9.1 mg/dL (ref 8.9–10.3)
Chloride: 94 mmol/L — ABNORMAL LOW (ref 98–111)
Chloride: 97 mmol/L — ABNORMAL LOW (ref 98–111)
Creatinine, Ser: 2.24 mg/dL — ABNORMAL HIGH (ref 0.61–1.24)
Creatinine, Ser: 1.62 mg/dL — ABNORMAL HIGH (ref 0.61–1.24)
GFR calc Af Amer: 35 mL/min — ABNORMAL LOW (ref >60)
GFR calc Af Amer: 52 mL/min — ABNORMAL LOW (ref >60)
GFR calc non Af Amer: 31 mL/min — ABNORMAL LOW (ref >60)
GFR calc non Af Amer: 45 mL/min — ABNORMAL LOW (ref >60)
Glucose, Bld: 131 mg/dL — ABNORMAL HIGH (ref 70–99)
Glucose, Bld: 109 mg/dL — ABNORMAL HIGH (ref 70–99)
Potassium: 3.6 mmol/L (ref 3.5–5.1)
Potassium: 3.3 mmol/L — ABNORMAL LOW (ref 3.5–5.1)
Sodium: 130 mmol/L — ABNORMAL LOW (ref 135–145)
Sodium: 132 mmol/L — ABNORMAL LOW (ref 135–145)

## 2019-12-01 LAB — SARS CORONAVIRUS 2 BY RT PCR (HOSPITAL ORDER, PERFORMED IN ~~LOC~~ HOSPITAL LAB): SARS Coronavirus 2: NEGATIVE

## 2019-12-01 LAB — PHOSPHORUS: Phosphorus: 4.7 mg/dL — ABNORMAL HIGH (ref 2.5–4.6)

## 2019-12-01 LAB — MAGNESIUM: Magnesium: 1.9 mg/dL (ref 1.7–2.4)

## 2019-12-01 LAB — HEMOGLOBIN A1C
Hgb A1c MFr Bld: 6.6 % — ABNORMAL HIGH (ref 4.8–5.6)
Mean Plasma Glucose: 142.72 mg/dL

## 2019-12-01 LAB — HIV ANTIBODY (ROUTINE TESTING W REFLEX): HIV Screen 4th Generation wRfx: NONREACTIVE

## 2019-12-01 MED ORDER — SODIUM CHLORIDE 0.9 % IV SOLN: INTRAVENOUS | Status: DC

## 2019-12-01 NOTE — Progress Notes (Addendum)
PROGRESS NOTE    [name]  SWN:[id number] DOB: 03-05-1961 DOA: 11/30/2019 PCP: Casey Perna, NP  Brief Narrative: Casey Warren is a 59 y.o. male with medical history significant of HTN, prior EtOH and substance abuse, now just drinks 2 beers a day. Pt has had seizures in past, most recently 1+ year ago, not on any chronic seizure meds. -Presented to the ED with lightheadedness / dizziness since starting a new BP med a couple of weeks ago.  Saw PCP, was apparently supposed to stop one / some of his BP meds after office visit on 6/14 but unsure which so kept taking them all. -Also reportedly had seizure-like activity prior to admission -ED Course: Sodium 126 down from 139 last month, creat 2.58 up from 1.0 last month.  K 3.3. Initial BP in the ED was recorded as low as 72/58.   Assessment & Plan:   Hypotension Suspected syncope -Admitted with dizziness, blood pressure of 70 and 2 episodes of seizure-like activity which I suspect is near syncope from hypotension -Improved with fluid boluses -I suspect this is medication induced and worsened by hypovolemia from high-dose diuretics -Continue IV fluids today -PT eval later today  Acute kidney injury -Due to volume depletion and hypotension -Antihypertensives, diuretics on hold, continue IV fluids today -Baseline creatinine is 1.0, admitted with creatinine of 2.5 yesterday evening, now down to 1.6  Hyponatremia -Due to hypovolemia, improving  Hyperglycemia -Check hemoglobin A1c  Hypertension -See discussion above, now hypotensive, antihypertensives on hold  Alcohol use disorder -Counseled, no withdrawal, drinks 2 beers per day -Continue thiamine  DVT prophylaxis: Lovenox Code Status: Full code Family Communication: No family at bedside Disposition Plan:  Status is: Inpatient  Remains inpatient appropriate because:Inpatient level of care appropriate due to severity of illness   Dispo: The patient is from:  Home              Anticipated d/c is to: Home              Anticipated d/c date is: 1 day              Patient currently is not medically stable to d/c. Consultants:    Procedures:   Antimicrobials:    Subjective: -Still a bit lightheaded and dizzy  Objective: Vitals:   12/01/19 0056 12/01/19 0107 12/01/19 0519 12/01/19 0931  BP:  92/74 95/69 94/64   Pulse:  79 76 94  Resp:  18 18 18   Temp: 97.6 F (36.4 C) 98.3 F (36.8 C) 97.6 F (36.4 C) 98.2 F (36.8 C)  TempSrc: Oral Oral Oral Oral  SpO2:  99% 93% 100%  Weight:   67.5 kg     Intake/Output Summary (Last 24 hours) at 12/01/2019 1157 Last data filed at 12/01/2019 0900 Gross per 24 hour  Intake 300 ml  Output 1 ml  Net 299 ml   Filed Weights   12/01/19 0519  Weight: 67.5 kg    Examination:  General exam: Appears calm and comfortable  Respiratory system: Clear to auscultation. Respiratory effort normal. Cardiovascular system: S1 & S2 heard, RRR. No JVD, murmurs, rubs, gallops Gastrointestinal system: Abdomen is nondistended, soft and nontender.Normal bowel sounds heard. Central nervous system: Alert and oriented. No focal neurological deficits. Extremities: Symmetric 5 x 5 power. Skin: No rashes, lesions or ulcers Psychiatry: Judgement and insight appear normal. Mood & affect appropriate.     Data Reviewed:   CBC: Recent Labs  Lab 11/30/19 1821  WBC 7.9  HGB 15.1  HCT 41.8  MCV 88.6  PLT 324   Basic Metabolic Panel: Recent Labs  Lab 11/30/19 1821 11/30/19 2304 12/01/19 0404  NA 126* 130* 132*  K 3.3* 3.6 3.3*  CL 92* 94* 97*  CO2 23 22 23   GLUCOSE 139* 131* 109*  BUN 22 23 22   CREATININE 2.58* 2.24* 1.62*  CALCIUM 9.4 9.0 9.1  MG  --  1.9  --   PHOS  --  4.7*  --    GFR: Estimated Creatinine Clearance: 38.5 mL/min (A) (by C-G formula based on SCr of 1.62 mg/dL (H)). Liver Function Tests: No results for input(s): AST, ALT, ALKPHOS, BILITOT, PROT, ALBUMIN in the last 168 hours. No  results for input(s): LIPASE, AMYLASE in the last 168 hours. No results for input(s): AMMONIA in the last 168 hours. Coagulation Profile: No results for input(s): INR, PROTIME in the last 168 hours. Cardiac Enzymes: No results for input(s): CKTOTAL, CKMB, CKMBINDEX, TROPONINI in the last 168 hours. BNP (last 3 results) No results for input(s): PROBNP in the last 8760 hours. HbA1C: No results for input(s): HGBA1C in the last 72 hours. CBG: No results for input(s): GLUCAP in the last 168 hours. Lipid Profile: No results for input(s): CHOL, HDL, LDLCALC, TRIG, CHOLHDL, LDLDIRECT in the last 72 hours. Thyroid Function Tests: No results for input(s): TSH, T4TOTAL, FREET4, T3FREE, THYROIDAB in the last 72 hours. Anemia Panel: No results for input(s): VITAMINB12, FOLATE, FERRITIN, TIBC, IRON, RETICCTPCT in the last 72 hours. Urine analysis:    Component Value Date/Time   COLORURINE AMBER (A) 11/30/2019 2310   APPEARANCEUR CLOUDY (A) 11/30/2019 2310   LABSPEC 1.021 11/30/2019 2310   PHURINE 5.0 11/30/2019 2310   GLUCOSEU NEGATIVE 11/30/2019 2310   HGBUR NEGATIVE 11/30/2019 2310   BILIRUBINUR NEGATIVE 11/30/2019 2310   KETONESUR 5 (A) 11/30/2019 2310   PROTEINUR 30 (A) 11/30/2019 2310   UROBILINOGEN 0.2 02/09/2008 0800   NITRITE NEGATIVE 11/30/2019 2310   LEUKOCYTESUR NEGATIVE 11/30/2019 2310   Sepsis Labs: @LABRCNTIP (procalcitonin:4,lacticidven:4)  ) Recent Results (from the past 240 hour(s))  SARS Coronavirus 2 by RT PCR (hospital order, performed in [facility] hospital lab) Nasopharyngeal Nasopharyngeal Swab     Status: None   Collection Time: 11/30/19 10:46 PM   Specimen: Nasopharyngeal Swab  Result Value Ref Range Status   SARS Coronavirus 2 NEGATIVE NEGATIVE Final    Comment: (NOTE) SARS-CoV-2 target nucleic acids are NOT DETECTED.  The SARS-CoV-2 RNA is generally detectable in upper and lower respiratory specimens during the acute phase of infection. The  lowest concentration of SARS-CoV-2 viral copies this assay can detect is 250 copies / mL. A negative result does not preclude SARS-CoV-2 infection and should not be used as the sole basis for treatment or other patient management decisions.  A negative result may occur with improper specimen collection / handling, submission of specimen other than nasopharyngeal swab, presence of viral mutation(s) within the areas targeted by this assay, and inadequate number of viral copies (<250 copies / mL). A negative result must be combined with clinical observations, patient history, and epidemiological information.  Fact Sheet for Patients:    Fact Sheet for Healthcare Providers: [facility]  This test is not yet approved or  cleared by the 12/02/19 FDA and has been authorized for detection and/or diagnosis of SARS-CoV-2 by FDA under an Emergency Use Authorization (EUA).  This EUA will remain in effect (meaning this test can be used) for the duration of the COVID-19 declaration  under Section 564(b)(1) of the Act, 21 U.S.C. section 360bbb-3(b)(1), unless the authorization is terminated or revoked sooner.  Performed at [facility], [address], [place], [place] [address]          Radiology Studies: CT Head Wo Contrast  Result Date: 11/30/2019 CLINICAL DATA:  Dizziness for 1 month.  Possible seizure EXAM: CT HEAD WITHOUT CONTRAST TECHNIQUE: Contiguous axial images were obtained from the base of the skull through the vertex without intravenous contrast. COMPARISON:  01/13/2017 FINDINGS: Brain: Remote left cerebellar infarct. Hypodensity in the right cerebellum is new from 01/13/2017 but less specific temporally, and could conceivably represent a late subacute cerebellar infarct on the right. Hypodense lesions in the pons compatible with small lacunar infarcts. Lacunar infarct of the right lentiform nucleus  probably extending into the genu of the right internal capsule, stable. Small lacunar infarct of the head of the left caudate nucleus, stable. Periventricular white matter and corona radiata hypodensities favor chronic ischemic microvascular white matter disease. Localized sulcal prominence medially along the right frontal lobe, unchanged. No intracranial hemorrhage or mass lesion identified. Vascular: There is atherosclerotic calcification of the cavernous carotid arteries bilaterally. Skull: Unremarkable Sinuses/Orbits: Chronic right maxillary sinusitis. Other: No supplemental non-categorized findings. IMPRESSION: 1. Hypodensity in the right cerebellum is new from 01/13/2017 and could represent a chronic or late subacute infarct. 2. Remote left cerebellar infarct. Remote lacunar infarcts in the pons, and bilateral basal ganglia. 3. Periventricular white matter and corona radiata hypodensities favor chronic ischemic microvascular white matter disease. 4. Chronic right maxillary sinusitis. Electronically Signed   By: Gaylyn Rong M.D.   On: 11/30/2019 21:09        Scheduled Meds: . aspirin  81 mg Oral Daily  . atorvastatin  20 mg Oral Daily  . enoxaparin (LOVENOX) injection  40 mg Subcutaneous Q24H  . escitalopram  20 mg Oral Daily  . folic acid  1 mg Oral Daily  . multivitamin with minerals  1 tablet Oral Daily  . sodium chloride flush  3 mL Intravenous Once  . thiamine  100 mg Oral Daily   Or  . thiamine  100 mg Intravenous Daily   Continuous Infusions: . sodium chloride       LOS: 1 day    Time spent:  Zannie Cove, MD [facility] 12/01/2019, 11:57 AM

## 2019-12-01 NOTE — Plan of Care (Signed)
  Problem: Education: Goal: Knowledge of General Education information will improve Description: Including pain rating scale, medication(s)/side effects and non-pharmacologic comfort measures Outcome: Progressing   Problem: Activity: Goal: Risk for activity intolerance will decrease Outcome: Progressing   

## 2019-12-01 NOTE — Progress Notes (Signed)
New Admission Note: ? Arrival Method: via bed  Mental Orientation: A/O x 4 Telemetry: Box #9 NSR Assessment: Completed Skin: Refer to flowsheet IV: Right AC NSL Pain: 0/10 Tubes: n/a Safety Measures: Safety Fall Prevention Plan discussed with patient. Admission: Completed [facility] [place] Orientation: Patient has been orientated to the room, unit and the staff.  Orders have been reviewed and are being implemented. Will continue to monitor the patient. Call light has been placed within reach and bed alarm has been activated.  ? [employer], RN [phone]

## 2019-12-02 LAB — CBC
HCT: 37.6 % — ABNORMAL LOW (ref 39.0–52.0)
Hemoglobin: 13.3 g/dL (ref 13.0–17.0)
MCH: 31.8 pg (ref 26.0–34.0)
MCHC: 35.4 g/dL (ref 30.0–36.0)
MCV: 90 fL (ref 80.0–100.0)
Platelets: 273 10*3/uL (ref 150–400)
RBC: 4.18 MIL/uL — ABNORMAL LOW (ref 4.22–5.81)
RDW: 11.7 % (ref 11.5–15.5)
WBC: 3.9 10*3/uL — ABNORMAL LOW (ref 4.0–10.5)
nRBC: 0 % (ref 0.0–0.2)

## 2019-12-02 LAB — BASIC METABOLIC PANEL
Anion gap: 6 (ref 5–15)
BUN: 17 mg/dL (ref 8–23)
CO2: 24 mmol/L (ref 22–32)
Calcium: 8.9 mg/dL (ref 8.9–10.3)
Chloride: 103 mmol/L (ref 98–111)
Creatinine, Ser: 1.2 mg/dL (ref 0.61–1.24)
GFR calc Af Amer: >60 mL/min (ref >60)
GFR calc non Af Amer: >60 mL/min (ref >60)
Glucose, Bld: 124 mg/dL — ABNORMAL HIGH (ref 70–99)
Potassium: 3.5 mmol/L (ref 3.5–5.1)
Sodium: 133 mmol/L — ABNORMAL LOW (ref 135–145)

## 2019-12-02 MED ORDER — LIVING WELL WITH DIABETES BOOK
Freq: Once | Status: AC
  Filled 2019-12-02: qty 1

## 2019-12-02 NOTE — Progress Notes (Signed)
DISCHARGE NOTE HOME MY[facility] SUIT to be discharged Home per MD order. Discussed prescriptions and follow up appointments with the patient. Prescriptions given to patient; medication list explained in detail. Patient verbalized understanding.  Skin clean, dry and intact without evidence of skin break down, no evidence of skin tears noted. IV catheter discontinued intact. Site without signs and symptoms of complications. Dressing and pressure applied. Pt denies pain at the site currently. No complaints noted.  Patient free of lines, drains, and wounds.   An After Visit Summary (AVS) was printed and given to the patient. Patient escorted via wheelchair, and discharged home via private auto.  Selina Cooley BSN, RN3

## 2019-12-02 NOTE — TOC Initial Note (Signed)
Transition of Care [facility]) - Initial/Assessment Note    Patient Details  Name: Casey Warren MRN: [id number] Date of Birth: Mar 30, 1961  [facility] CM/SW Contact:    Bartholomew Crews, RN Phone Number: [phone] 12/02/2019, 10:22 AM  Clinical Narrative:                  Spoke with patient at the bedside to discuss plans to transition home. PTA home with spouse who manages his medical follow up for MD appointments and prescriptions. Confirmed PCP at [facility] and preferred pharmacy at [facility]. Patient ambulated in room and hallway independently while NCM present. Patient states his spouse to pick him up for discharge. No TOC needs identified.   Expected Discharge Plan: Home/Self Care Barriers to Discharge: No Barriers Identified   Patient Goals and CMS Choice Patient states their goals for this hospitalization and ongoing recovery are:: return home with wife CMS Medicare.gov Compare Post Acute Care list provided to:: Patient Choice offered to / list presented to : NA  Expected Discharge Plan and Services Expected Discharge Plan: Home/Self Care In-house Referral: NA Discharge Planning Services: CM Consult Post Acute Care Choice: NA Living arrangements for the past 2 months: Single Family Home Expected Discharge Date: 12/02/19               DME Arranged: N/A DME Agency: NA       HH Arranged: NA [place] Agency: NA        Prior Living Arrangements/Services Living arrangements for the past 2 months: Single Family Home Lives with:: Self, Spouse Patient language and need for interpreter reviewed:: Yes Do you feel safe going back to the place where you live?: Yes      Need for Family Participation in Patient Care: Yes (Comment) Care giver support system in place?: Yes (comment)   Criminal Activity/Legal Involvement Pertinent to Current Situation/Hospitalization: No - Comment as needed  Activities of Daily Living Home Assistive Devices/Equipment:  None ADL Screening (condition at time of admission) Patient's cognitive ability adequate to safely complete daily activities?: Yes Is the patient deaf or have difficulty hearing?: No Does the patient have difficulty seeing, even when wearing glasses/contacts?: No Does the patient have difficulty concentrating, remembering, or making decisions?: No Patient able to express need for assistance with ADLs?: Yes Does the patient have difficulty dressing or bathing?: No Independently performs ADLs?: Yes (appropriate for developmental age) Does the patient have difficulty walking or climbing stairs?: No Weakness of Legs: None Weakness of Arms/Hands: None  Permission Sought/Granted                  Emotional Assessment Appearance:: Appears stated age Attitude/Demeanor/Rapport: Engaged Affect (typically observed): Accepting Orientation: : Oriented to Self, Oriented to  Time, Oriented to Place, Oriented to Situation Alcohol / Substance Use: Not Applicable Psych Involvement: No (comment)  Admission diagnosis:  Hypokalemia [E87.6] Hyponatremia [E87.1] Seizure (HCC) [R56.9] AKI (acute kidney injury) ([place]) [N17.9] Near syncope [R55] Patient Active Problem List   Diagnosis Date Noted  . Hyponatremia 11/30/2019  . AKI (acute kidney injury) ([place]) 11/30/2019  . Hypotension due to medication 11/30/2019  . Seizures ([place]) 11/30/2019  . Abnormal EKG 04/06/2017  . Essential hypertension 04/06/2017  . ANEMIA 04/28/2008  . Alcohol abuse 04/28/2008  . TOBACCO ABUSE 04/28/2008  . SUBSTANCE ABUSE, MULTIPLE 04/28/2008  . Dental caries 04/28/2008  . GERD 04/28/2008  . PEPTIC ULCER DISEASE, HELICOBACTER PYLORI POSITIVE 04/28/2008   PCP:  Kerin Perna, NP Pharmacy:  [facility] [place] [place] - [place] [address] [place] [address] Phone: [phone] Fax: [phone]     Social Determinants of Health (SDOH) Interventions    Readmission  Risk Interventions No flowsheet data found.

## 2019-12-02 NOTE — Plan of Care (Signed)
  RD consulted for nutrition education regarding diabetes.   Lab Results  Component Value Date   HGBA1C 6.6 (H) 12/01/2019   Per discharge instructions, pt will not go home on any DM medications.   Paged by RN, who is requesting RD provide DM diet education prior to discharge scheduled today.   Spoke with pt at bedside, who reports he is ready to go home. He acknowledges new DM diagnosis. 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 His meal schedule is very inconsistent and often does not have scheduled meal breaks. He states he consumes only one meal per day, which consists of pasta. Per pt, "I don't eat sweets or drink soda". Pt reports he drinks some water and consumes a lot of Gatorade, stating he has multiple cases at home.   Discussed importance of good self-management, including routine follow-up by PCP. Discussed Hgb A1c results and importance of good glycemic control to prevent further complications. He shares his wife has DM and "she will set me straight". Pt was not very interested in speaking with this RD and denied any DM or nutrition-related questions at time of visit. Pt accepted handout, but stated he could not read it- offered low literacy handout or handout with larger font, however, pt declined. Also offered to call pt wife to discuss care plan with her, however, pt also refused. Pt very eager to discharge home.   RD provided "Carbohydrate Counting for People with Diabetes" handout from the Academy of Nutrition and Dietetics. Discussed different food groups and their effects on blood sugar, emphasizing carbohydrate-containing foods. Provided list of carbohydrates and recommended serving sizes of common foods.  Discussed importance of controlled and consistent carbohydrate intake throughout the day. Provided examples of ways to balance meals/snacks and encouraged intake of high-fiber, whole grain complex carbohydrates. Teach back method used.  Expect  fair to poor compliance.  Current diet order is carb modified, patient is consuming approximately 75% of meals at this time. Labs and medications reviewed. No further nutrition interventions warranted at this time. RD contact information provided. If additional nutrition issues arise, please re-consult RD.  Levada Schilling, RD, LDN, CDCES Registered Dietitian II Certified Diabetes Care and Education Specialist Please refer to [facility] for RD and/or RD on-call/weekend/after hours pager

## 2019-12-02 NOTE — Discharge Summary (Signed)
Physician Discharge Summary  Casey Warren WEX:[id number] DOB: Feb 19, 1961 DOA: 11/30/2019  PCP: Grayce Sessions, NP  Admit date: 11/30/2019 Discharge date: 12/02/2019  Time spent: 35 minutes  Recommendations for Outpatient Follow-up:  1. PCP in 1 week, monitor log of patient's home BP readings, slowly restart low-dose lisinopril if blood pressure starts trending up   Discharge Diagnoses:  Principal Problem:   AKI (acute kidney injury) (HCC)   Hypovolemic shock    hypotension   Essential hypertension   Hyponatremia   Hypotension due to medication Near syncope New diabetes mellitus  Discharge Condition: Stable  Diet recommendation: Diabetic, heart healthy  Filed Weights   12/01/19 0519 12/01/19 2116  Weight: 67.5 kg 67.5 kg    History of present illness:  Casey Kall Normanis a 59 y.o.malewith medical history significant ofHTN, prior EtOH and substance abuse, now just drinks 2 beers a day. -Presented to the ED with lightheadedness / dizziness since starting a new BP med a couple of weeks ago. Saw PCP, was apparently supposed to stop one / some of his BP meds after office visit on 6/14 but unsure which so kept taking them all. -Also reportedly had near syncopal event prior to admission -ED Course:Sodium 126 down from 139 last month, creat 2.58 up from 1.0 last month. K 3.3. Initial BP in the ED was recorded at 72/58.  Hospital Course:   Hypotension Near syncope -Admitted with dizziness, blood pressure of 70 and 2 near syncopal episodes -All his antihypertensives were discontinued, suspect this is primarily medication induced with high-dose diuretic regimen and multiple antihypertensives -Treated with fluid boluses and continuous saline infusion -Clinically improved now, ambulating in the halls without symptoms, blood pressure in the 95-1 05 range at this time, he will be discharged home off all antihypertensives, can restart low-dose lisinopril if his blood pressure  trends up down the road, he is asked to keep a log of his blood pressure at home and take this to his PCP for follow-up  Acute kidney injury -Due to volume depletion and hypotension -Antihypertensives, diuretics stopped -Baseline creatinine is 1.0, admitted with creatinine of 2.5, improved back to baseline  Hyponatremia -Due to hypovolemia, improved  Hyperglycemia New diabetes mellitus -Hemoglobin A1c is 6.6 -Patient and wife educated regarding diet and lifestyle modification, weight loss  Hypertension -See discussion above, now hypotensive, antihypertensives stopped  Alcohol use disorder -Counseled, no withdrawal, drinks 2 beers per day -Continue thiamine   Discharge Exam: Vitals:   12/02/19 0519 12/02/19 0944  BP: 93/70 105/79  Pulse: 75 90  Resp: 17 18  Temp: 98.6 F (37 C)   SpO2: 98% 98%    General: Awake alert oriented x3 Cardiovascular: S1-S2, regular rate rhythm Respiratory: Clear  Discharge Instructions   Discharge Instructions    Diet - low sodium heart healthy   Complete by: As directed    Increase activity slowly   Complete by: As directed      Allergies as of 12/02/2019      Reactions   Citrus    Chlorthalidone    Hyponatremia      Medication List    STOP taking these medications   amLODipine 10 MG tablet Commonly known as: NORVASC   lisinopril 20 MG tablet Commonly known as: ZESTRIL     TAKE these medications   acetaminophen 325 MG tablet Commonly known as: TYLENOL Take 650 mg by mouth every 6 (six) hours as needed for mild pain.   aspirin 81 MG chewable tablet Chew 4  tablets (324 mg total) by mouth daily. What changed: how much to take   atorvastatin 20 MG tablet Commonly known as: LIPITOR Take 1 tablet (20 mg total) by mouth daily.   escitalopram 20 MG tablet Commonly known as: LEXAPRO Take 1 tablet (20 mg total) by mouth daily.      Allergies  Allergen Reactions  . Citrus   . Chlorthalidone     Hyponatremia     Follow-up Information    Kerin Perna, NP. Schedule an appointment as soon as possible for a visit in 1 month(s).   Specialty: Internal Medicine Contact information: [place] [address] [phone]                The results of significant diagnostics from this hospitalization (including imaging, microbiology, ancillary and laboratory) are listed below for reference.    Significant Diagnostic Studies: CT Head Wo Contrast  Result Date: 11/30/2019 CLINICAL DATA:  Dizziness for 1 month.  Possible seizure EXAM: CT HEAD WITHOUT CONTRAST TECHNIQUE: Contiguous axial images were obtained from the base of the skull through the vertex without intravenous contrast. COMPARISON:  01/13/2017 FINDINGS: Brain: Remote left cerebellar infarct. Hypodensity in the right cerebellum is new from 01/13/2017 but less specific temporally, and could conceivably represent a late subacute cerebellar infarct on the right. Hypodense lesions in the pons compatible with small lacunar infarcts. Lacunar infarct of the right lentiform nucleus probably extending into the genu of the right internal capsule, stable. Small lacunar infarct of the head of the left caudate nucleus, stable. Periventricular white matter and corona radiata hypodensities favor chronic ischemic microvascular white matter disease. Localized sulcal prominence medially along the right frontal lobe, unchanged. No intracranial hemorrhage or mass lesion identified. Vascular: There is atherosclerotic calcification of the cavernous carotid arteries bilaterally. Skull: Unremarkable Sinuses/Orbits: Chronic right maxillary sinusitis. Other: No supplemental non-categorized findings. IMPRESSION: 1. Hypodensity in the right cerebellum is new from 01/13/2017 and could represent a chronic or late subacute infarct. 2. Remote left cerebellar infarct. Remote lacunar infarcts in the pons, and bilateral basal ganglia. 3. Periventricular white  matter and corona radiata hypodensities favor chronic ischemic microvascular white matter disease. 4. Chronic right maxillary sinusitis. Electronically Signed   By: Van Clines M.D.   On: 11/30/2019 21:09    Microbiology: Recent Results (from the past 240 hour(s))  SARS Coronavirus 2 by RT PCR (hospital order, performed in [facility] hospital lab) Nasopharyngeal Nasopharyngeal Swab     Status: None   Collection Time: 11/30/19 10:46 PM   Specimen: Nasopharyngeal Swab  Result Value Ref Range Status   SARS Coronavirus 2 NEGATIVE NEGATIVE Final    Comment: (NOTE) SARS-CoV-2 target nucleic acids are NOT DETECTED.  The SARS-CoV-2 RNA is generally detectable in upper and lower respiratory specimens during the acute phase of infection. The lowest concentration of SARS-CoV-2 viral copies this assay can detect is 250 copies / mL. A negative result does not preclude SARS-CoV-2 infection and should not be used as the sole basis for treatment or other patient management decisions.  A negative result may occur with improper specimen collection / handling, submission of specimen other than nasopharyngeal swab, presence of viral mutation(s) within the areas targeted by this assay, and inadequate number of viral copies (<250 copies / mL). A negative result must be combined with clinical observations, patient history, and epidemiological information.  Fact Sheet for Patients:   StrictlyIdeas.no  Fact Sheet for Healthcare Providers: BankingDealers.co.za  This test is not yet approved or  cleared by the [place] and has been authorized for detection and/or diagnosis of SARS-CoV-2 by FDA under an Emergency Use Authorization (EUA).  This EUA will remain in effect (meaning this test can be used) for the duration of the COVID-19 declaration under Section 564(b)(1) of the Act, 21 U.S.C. section 360bbb-3(b)(1), unless the authorization is  terminated or revoked sooner.  Performed at [facility], [address] [place], [place] [address]      Labs: Basic Metabolic Panel: Recent Labs  Lab 11/30/19 1821 11/30/19 2304 12/01/19 0404 12/02/19 0357  NA 126* 130* 132* 133*  K 3.3* 3.6 3.3* 3.5  CL 92* 94* 97* 103  CO2 23 22 23 24   GLUCOSE 139* 131* 109* 124*  BUN 22 23 22 17   CREATININE 2.58* 2.24* 1.62* 1.20  CALCIUM 9.4 9.0 9.1 8.9  MG  --  1.9  --   --   PHOS  --  4.7*  --   --    Liver Function Tests: No results for input(s): AST, ALT, ALKPHOS, BILITOT, PROT, ALBUMIN in the last 168 hours. No results for input(s): LIPASE, AMYLASE in the last 168 hours. No results for input(s): AMMONIA in the last 168 hours. CBC: Recent Labs  Lab 11/30/19 1821 12/02/19 0357  WBC 7.9 3.9*  HGB 15.1 13.3  HCT 41.8 37.6*  MCV 88.6 90.0  PLT 324 273   Cardiac Enzymes: No results for input(s): CKTOTAL, CKMB, CKMBINDEX, TROPONINI in the last 168 hours. BNP: BNP (last 3 results) No results for input(s): BNP in the last 8760 hours.  ProBNP (last 3 results) No results for input(s): PROBNP in the last 8760 hours.  CBG: No results for input(s): GLUCAP in the last 168 hours.     Signed:  12/02/19 MD.  [facility] 12/02/2019, 11:59 AM

## 2019-12-03 ENCOUNTER — Telehealth: Payer: Self-pay

## 2019-12-03 NOTE — Telephone Encounter (Signed)
Called pt at [phone] Spoke with Loyal Jacobson (wife)/DPR on file/ pt not available at this time.   Transition Care Management Follow-up Telephone Call Date of discharge and from where: 12/02/2019 [facility] How have you been since you were released from the hospital? Feeling better  Any questions or concerns? None / Wife stated he understand all discharge instructions and recommendations to cut back the sugar from his diet . Items Reviewed: Did the pt receive and understand the discharge instructions provided? YES Medications obtained and verified? YES  Any new allergies since your discharge? NONE Dietary orders reviewed? Yes  Do you have support at home? Wife   Functional Questionnaire: (I = Independent and D = Dependent) ADLs: I   Follow up appointments reviewed:  PCP Hospital f/u appt confirmed?  Scheduled to see NP Gwinda Passe on 12/16/2019 [facility] Hospital f/u appt confirmed? No Are transportation arrangements needed? NO  If their condition worsens, /is the pt aware to call PCP or go to the Emergency Dept.?  Pt is aware if condition is worsening or start experiencing any of diff breathing, SOB, dizziness, slurred speech, chest pain, extreme fatigue,  Persistent nausea and vomiting, bleeding , rapid weight gain, severe uncontrolled pain, or visual disturbances to return to ED  Was the patient provided with contact information for the PCP's office or ED? YES given.  Was to pt encouraged to call back with questions or concerns?YES name and contact information

## 2019-12-09 ENCOUNTER — Ambulatory Visit (INDEPENDENT_AMBULATORY_CARE_PROVIDER_SITE_OTHER): Payer: Self-pay | Admitting: Primary Care

## 2019-12-16 ENCOUNTER — Other Ambulatory Visit: Payer: Self-pay

## 2019-12-16 ENCOUNTER — Encounter (INDEPENDENT_AMBULATORY_CARE_PROVIDER_SITE_OTHER): Payer: Self-pay | Admitting: Primary Care

## 2019-12-16 ENCOUNTER — Inpatient Hospital Stay (INDEPENDENT_AMBULATORY_CARE_PROVIDER_SITE_OTHER): Payer: Self-pay | Admitting: Primary Care

## 2019-12-16 ENCOUNTER — Ambulatory Visit (INDEPENDENT_AMBULATORY_CARE_PROVIDER_SITE_OTHER): Payer: Self-pay | Admitting: Primary Care

## 2019-12-16 VITALS — BP 147/102 | HR 85 | Temp 98.0°F | Ht 63.0 in | Wt 160.2 lb

## 2019-12-16 DIAGNOSIS — E876 Hypokalemia: Secondary | ICD-10-CM

## 2019-12-16 DIAGNOSIS — Z09 Encounter for follow-up examination after completed treatment for conditions other than malignant neoplasm: Secondary | ICD-10-CM

## 2019-12-16 DIAGNOSIS — I10 Essential (primary) hypertension: Secondary | ICD-10-CM

## 2019-12-16 NOTE — Progress Notes (Signed)
Pt states he has sinus drainage at night and causes him to cough and choke would like Rx to dry up sinuses  Pt does admit to still drinking 2 beers a day

## 2019-12-16 NOTE — Patient Instructions (Signed)
Continue all antihypertensives as prescribed.  Remember to bring in your blood pressure log with you for your follow up appointment.  DASH/Mediterranean Diets are healthier choices for HTN.   Hypertension, Adult Hypertension is another name for high blood pressure. High blood pressure forces your heart to work harder to pump blood. This can cause problems over time. There are two numbers in a blood pressure reading. There is a top number (systolic) over a bottom number (diastolic). It is best to have a blood pressure that is below 120/80. Healthy choices can help lower your blood pressure, or you may need medicine to help lower it. What are the causes? The cause of this condition is not known. Some conditions may be related to high blood pressure. What increases the risk?  Smoking.  Having type 2 diabetes mellitus, high cholesterol, or both.  Not getting enough exercise or physical activity.  Being overweight.  Having too much fat, sugar, calories, or salt (sodium) in your diet.  Drinking too much alcohol.  Having long-term (chronic) kidney disease.  Having a family history of high blood pressure.  Age. Risk increases with age.  Race. You may be at higher risk if you are African American.  Gender. Men are at higher risk than women before age 21. After age 31, women are at higher risk than men.  Having obstructive sleep apnea.  Stress. What are the signs or symptoms?  High blood pressure may not cause symptoms. Very high blood pressure (hypertensive crisis) may cause: ? Headache. ? Feelings of worry or nervousness (anxiety). ? Shortness of breath. ? Nosebleed. ? A feeling of being sick to your stomach (nausea). ? Throwing up (vomiting). ? Changes in how you see. ? Very bad chest pain. ? Seizures. How is this treated?  This condition is treated by making healthy lifestyle changes, such as: ? Eating healthy foods. ? Exercising more. ? Drinking less alcohol.  Your  health care provider may prescribe medicine if lifestyle changes are not enough to get your blood pressure under control, and if: ? Your top number is above 130. ? Your bottom number is above 80.  Your personal target blood pressure may vary. Follow these instructions at home: Eating and drinking   If told, follow the DASH eating plan. To follow this plan: ? Fill one half of your plate at each meal with fruits and vegetables. ? Fill one fourth of your plate at each meal with whole grains. Whole grains include whole-wheat pasta, brown rice, and whole-grain bread. ? Eat or drink low-fat dairy products, such as skim milk or low-fat yogurt. ? Fill one fourth of your plate at each meal with low-fat (lean) proteins. Low-fat proteins include fish, chicken without skin, eggs, beans, and tofu. ? Avoid fatty meat, cured and processed meat, or chicken with skin. ? Avoid pre-made or processed food.  Eat less than 1,500 mg of salt each day.  Do not drink alcohol if: ? Your doctor tells you not to drink. ? You are pregnant, may be pregnant, or are planning to become pregnant.  If you drink alcohol: ? Limit how much you use to:  0-1 drink a day for women.  0-2 drinks a day for men. ? Be aware of how much alcohol is in your drink. In the [place] one drink equals one 12 oz bottle of beer (355 mL), one 5 oz glass of wine (148 mL), or one 1 oz glass of hard liquor (44 mL). Lifestyle   Work with  your doctor to stay at a healthy weight or to lose weight. Ask your doctor what the best weight is for you.  Get at least 30 minutes of exercise most days of the week. This may include walking, swimming, or biking.  Get at least 30 minutes of exercise that strengthens your muscles (resistance exercise) at least 3 days a week. This may include lifting weights or doing Pilates.  Do not use any products that contain nicotine or tobacco, such as cigarettes, e-cigarettes, and chewing tobacco. If you need help  quitting, ask your doctor.  Check your blood pressure at home as told by your doctor.  Keep all follow-up visits as told by your doctor. This is important. Medicines  Take over-the-counter and prescription medicines only as told by your doctor. Follow directions carefully.  Do not skip doses of blood pressure medicine. The medicine does not work as well if you skip doses. Skipping doses also puts you at risk for problems.  Ask your doctor about side effects or reactions to medicines that you should watch for. Contact a doctor if you:  Think you are having a reaction to the medicine you are taking.  Have headaches that keep coming back (recurring).  Feel dizzy.  Have swelling in your ankles.  Have trouble with your vision. Get help right away if you:  Get a very bad headache.  Start to feel mixed up (confused).  Feel weak or numb.  Feel faint.  Have very bad pain in your: ? Chest. ? Belly (abdomen).  Throw up more than once.  Have trouble breathing. Summary  Hypertension is another name for high blood pressure.  High blood pressure forces your heart to work harder to pump blood.  For most people, a normal blood pressure is less than 120/80.  Making healthy choices can help lower blood pressure. If your blood pressure does not get lower with healthy choices, you may need to take medicine. This information is not intended to replace advice given to you by your health care provider. Make sure you discuss any questions you have with your health care provider. Document Revised: 02/04/2018 Document Reviewed: 02/04/2018 Elsevier Patient Education  2020 [employer].

## 2019-12-16 NOTE — Progress Notes (Signed)
Assessment and Plan: Casey Warren was seen today for hospitalization follow-up.  Diagnoses and all orders for this visit:  Essential hypertension Blood pressures reading systolic 104- 135 and diastolic 72-109   Blood pressure is elevated Bp stopped during hospitalization . Chlorothion caused hyponatremia. Today we will start amlodipine 10mg  daily and HCTZ 25mg  . Counseled on  low-sodium, DASH diet, medication compliance, 150 minutes of moderate intensity exercise per week. Discussed medication compliance, adverse effects.  Hospital discharge follow-up Recommendations follow up with PCP 1 month keep record of Bp- this was done far in between .   HPI Casey Warren 59 y.o.male presents for follow up from the hospital. Admit date to the hospital was 11/30/19, patient was discharged from the hospital on 12/02/19, patient was admitted for: acute kidney problem . He complaints of  sinus drainage at night and causes him to cough and choke .  Past Medical History:  Diagnosis Date  . 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 GERD (gastroesophageal reflux disease)   . History of blood transfusion   . HTN (hypertension)   . Ulcer      Allergies  Allergen Reactions  . Citrus   . Chlorthalidone     Hyponatremia      Current Outpatient Medications on File Prior to Visit  Medication Sig Dispense Refill  . aspirin 81 MG chewable tablet Chew 4 tablets (324 mg total) by mouth daily. (Patient taking differently: Chew 81 mg by mouth daily. ) 30 tablet 11  . atorvastatin (LIPITOR) 20 MG tablet Take 1 tablet (20 mg total) by mouth daily. 90 tablet 3  . escitalopram (LEXAPRO) 20 MG tablet Take 1 tablet (20 mg total) by mouth daily. 90 tablet 1  . acetaminophen (TYLENOL) 325 MG tablet Take 650 mg by mouth every 6 (six) hours as needed for mild pain. (Patient not taking: Reported on 12/16/2019)     No current facility-administered medications on file prior to visit.    ROS: all negative except above.   Physical  Exam: Filed Weights   12/16/19 1012  Weight: 160 lb 3.2 oz (72.7 kg)   BP (!) 147/102 (BP Location: Right Arm, Patient Position: Sitting, Cuff Size: Normal)   Pulse 85   Temp 98 F (36.7 C) (Oral)   Ht 5\' 3"  (1.6 m)   Wt 160 lb 3.2 oz (72.7 kg)   SpO2 96%   BMI 28.38 kg/m  General Appearance: Well nourished, in no apparent distress. Eyes: PERRLA, EOMs, conjunctiva no swelling or erythema Sinuses: No Frontal/maxillary tenderness ENT/Mouth:  TMs without erythema, bulging. Hearing normal.  Neck: Supple, thyroid normal.  Respiratory: Respiratory effort normal, BS equal bilaterally without rales, rhonchi, wheezing or stridor.  Cardio: RRR with no MRGs. Brisk peripheral pulses without edema.  Abdomen: Soft, + BS.  Non tender, no guarding, rebound, hernias, masses. Lymphatics: Non tender without lymphadenopathy.  Musculoskeletal: Full ROM, 5/5 strength, normal gait.  Skin: Warm, dry without rashes, lesions, ecchymosis.  Neuro: Cranial nerves intact. Normal muscle tone, no cerebellar symptoms. Sensation intact.  Psych: Awake and oriented X 3, normal affect, Insight and Judgment appropriate.     02/16/2020, NP 10:38 AM

## 2019-12-17 ENCOUNTER — Telehealth (INDEPENDENT_AMBULATORY_CARE_PROVIDER_SITE_OTHER): Payer: Self-pay

## 2019-12-17 LAB — CMP14+EGFR
ALT: 22 IU/L (ref 0–44)
AST: 20 IU/L (ref 0–40)
Albumin/Globulin Ratio: 1.5 (ref 1.2–2.2)
Albumin: 3.9 g/dL (ref 3.8–4.8)
Alkaline Phosphatase: 118 IU/L (ref 48–121)
BUN/Creatinine Ratio: 7 — ABNORMAL LOW (ref 10–24)
BUN: 6 mg/dL — ABNORMAL LOW (ref 8–27)
Bilirubin Total: 0.4 mg/dL (ref 0.0–1.2)
CO2: 21 mmol/L (ref 20–29)
Calcium: 8.4 mg/dL — ABNORMAL LOW (ref 8.6–10.2)
Chloride: 105 mmol/L (ref 96–106)
Creatinine, Ser: 0.91 mg/dL (ref 0.76–1.27)
GFR calc Af Amer: 105 mL/min/{1.73_m2} (ref >59)
GFR calc non Af Amer: 91 mL/min/{1.73_m2} (ref >59)
Globulin, Total: 2.6 g/dL (ref 1.5–4.5)
Glucose: 100 mg/dL — ABNORMAL HIGH (ref 65–99)
Potassium: 4 mmol/L (ref 3.5–5.2)
Sodium: 140 mmol/L (ref 134–144)
Total Protein: 6.5 g/dL (ref 6.0–8.5)

## 2019-12-17 NOTE — Telephone Encounter (Signed)
Patient was asleep. His wife is aware that results are overall normal. Advised to increase water and purchase OTC multivitamin with calcium and vitamin D. She verbalized understanding and will tell patient when he wakes up. Maryjean Morn, CMA

## 2019-12-17 NOTE — Telephone Encounter (Signed)
-----   Message from Grayce Sessions, NP sent at 12/17/2019 10:25 AM EDT ----- Labs reviewed essential normal  kidneys slightly low maybe come from fasting the day of labs . Increase water, Calcium 8.4 start a multivitamin with calcium and vitamin D

## 2019-12-27 ENCOUNTER — Ambulatory Visit (INDEPENDENT_AMBULATORY_CARE_PROVIDER_SITE_OTHER): Payer: Self-pay | Admitting: Primary Care

## 2020-01-13 ENCOUNTER — Encounter (INDEPENDENT_AMBULATORY_CARE_PROVIDER_SITE_OTHER): Payer: Self-pay | Admitting: Primary Care

## 2020-01-13 ENCOUNTER — Other Ambulatory Visit: Payer: Self-pay

## 2020-01-13 ENCOUNTER — Ambulatory Visit (INDEPENDENT_AMBULATORY_CARE_PROVIDER_SITE_OTHER): Payer: Self-pay | Admitting: Primary Care

## 2020-01-13 VITALS — BP 148/105 | HR 92 | Temp 98.3°F | Ht 63.0 in | Wt 158.4 lb

## 2020-01-13 DIAGNOSIS — I10 Essential (primary) hypertension: Secondary | ICD-10-CM

## 2020-01-13 DIAGNOSIS — F17200 Nicotine dependence, unspecified, uncomplicated: Secondary | ICD-10-CM

## 2020-01-13 NOTE — Patient Instructions (Signed)
   Managing Your Hypertension Hypertension is commonly called high blood pressure. This is when the force of your blood pressing against the walls of your arteries is too strong. Arteries are blood vessels that carry blood from your heart throughout your body. Hypertension forces the heart to work harder to pump blood, and may cause the arteries to become narrow or stiff. Having untreated or uncontrolled hypertension can cause heart attack, stroke, kidney disease, and other problems. What are blood pressure readings? A blood pressure reading consists of a higher number over a lower number. Ideally, your blood pressure should be below 120/80. The first ("top") number is called the systolic pressure. It is a measure of the pressure in your arteries as your heart beats. The second ("bottom") number is called the diastolic pressure. It is a measure of the pressure in your arteries as the heart relaxes. What does my blood pressure reading mean? Blood pressure is classified into four stages. Based on your blood pressure reading, your health care provider may use the following stages to determine what type of treatment you need, if any. Systolic pressure and diastolic pressure are measured in a unit called mm Hg. Normal  Systolic pressure: below 120.  Diastolic pressure: below 80. Elevated  Systolic pressure: 120-129.  Diastolic pressure: below 80. Hypertension stage 1  Systolic pressure: 130-139.  Diastolic pressure: 80-89. Hypertension stage 2  Systolic pressure: 140 or above.  Diastolic pressure: 90 or above. What health risks are associated with hypertension? Managing your hypertension is an important responsibility. Uncontrolled hypertension can lead to:  A heart attack.  A stroke.  A weakened blood vessel (aneurysm).  Heart failure.  Kidney damage.  Eye damage.  Metabolic syndrome.  Memory and concentration problems. What changes can I make to manage my  hypertension? Hypertension can be managed by making lifestyle changes and possibly by taking medicines. Your health care provider will help you make a plan to bring your blood pressure within a normal range. Eating and drinking   Eat a diet that is high in fiber and potassium, and low in salt (sodium), added sugar, and fat. An example eating plan is called the DASH (Dietary Approaches to Stop Hypertension) diet. To eat this way: ? Eat plenty of fresh fruits and vegetables. Try to fill half of your plate at each meal with fruits and vegetables. ? Eat whole grains, such as whole wheat pasta, brown rice, or whole grain bread. Fill about one quarter of your plate with whole grains. ? Eat low-fat diary products. ? Avoid fatty cuts of meat, processed or cured meats, and poultry with skin. Fill about one quarter of your plate with lean proteins such as fish, chicken without skin, beans, eggs, and tofu. ? Avoid premade and processed foods. These tend to be higher in sodium, added sugar, and fat.  Reduce your daily sodium intake. Most people with hypertension should eat less than 1,500 mg of sodium a day.  Limit alcohol intake to no more than 1 drink a day for nonpregnant women and 2 drinks a day for men. One drink equals 12 oz of beer, 5 oz of wine, or 1 oz of hard liquor. Lifestyle  Work with your health care provider to maintain a healthy body weight, or to lose weight. Ask what an ideal weight is for you.  Get at least 30 minutes of exercise that causes your heart to beat faster (aerobic exercise) most days of the week. Activities may include walking, swimming, or biking.    Include exercise to strengthen your muscles (resistance exercise), such as weight lifting, as part of your weekly exercise routine. Try to do these types of exercises for 30 minutes at least 3 days a week.  Do not use any products that contain nicotine or tobacco, such as cigarettes and e-cigarettes. If you need help quitting,  ask your health care provider.  Control any long-term (chronic) conditions you have, such as high cholesterol or diabetes. Monitoring  Monitor your blood pressure at home as told by your health care provider. Your personal target blood pressure may vary depending on your medical conditions, your age, and other factors.  Have your blood pressure checked regularly, as often as told by your health care provider. Working with your health care provider  Review all the medicines you take with your health care provider because there may be side effects or interactions.  Talk with your health care provider about your diet, exercise habits, and other lifestyle factors that may be contributing to hypertension.  Visit your health care provider regularly. Your health care provider can help you create and adjust your plan for managing hypertension. Will I need medicine to control my blood pressure? Your health care provider may prescribe medicine if lifestyle changes are not enough to get your blood pressure under control, and if:  Your systolic blood pressure is 130 or higher.  Your diastolic blood pressure is 80 or higher. Take medicines only as told by your health care provider. Follow the directions carefully. Blood pressure medicines must be taken as prescribed. The medicine does not work as well when you skip doses. Skipping doses also puts you at risk for problems. Contact a health care provider if:  You think you are having a reaction to medicines you have taken.  You have repeated (recurrent) headaches.  You feel dizzy.  You have swelling in your ankles.  You have trouble with your vision. Get help right away if:  You develop a severe headache or confusion.  You have unusual weakness or numbness, or you feel faint.  You have severe pain in your chest or abdomen.  You vomit repeatedly.  You have trouble breathing. Summary  Hypertension is when the force of blood pumping  through your arteries is too strong. If this condition is not controlled, it may put you at risk for serious complications.  Your personal target blood pressure may vary depending on your medical conditions, your age, and other factors. For most people, a normal blood pressure is less than 120/80.  Hypertension is managed by lifestyle changes, medicines, or both. Lifestyle changes include weight loss, eating a healthy, low-sodium diet, exercising more, and limiting alcohol. This information is not intended to replace advice given to you by your health care provider. Make sure you discuss any questions you have with your health care provider. Document Revised: 09/18/2018 Document Reviewed: 04/24/2016 Elsevier Patient Education  2020 Elsevier Inc.  

## 2020-01-13 NOTE — Progress Notes (Signed)
[facility]    Casey Warren, Casey Warren presents for  hypertension evaluation, on previous visit  No medication all been d/c in hospital or ov. Will start Amlodipine 10mg  and HCTZ 25mg .  Current Medication List Current Outpatient Medications on File Prior to Visit  Medication Sig Dispense Refill  . acetaminophen (TYLENOL) 325 MG tablet Take 650 mg by mouth every 6 (six) hours as needed for mild pain.     aspirin 81 MG chewable tablet Chew 4 tablets (324 mg total) by mouth daily. (Patient taking differently: Chew 81 mg by mouth daily. ) 30 tablet 11  . atorvastatin (LIPITOR) 20 MG tablet Take 1 tablet (20 mg total) by mouth daily. 90 tablet 3  . escitalopram (LEXAPRO) 20 MG tablet Take 1 tablet (20 mg total) by mouth daily. 90 tablet 1   No current facility-administered medications on file prior to visit.   Past Medical History  Past Medical History:  Diagnosis Date  . 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 GERD (gastroesophageal reflux disease)   . History of blood transfusion   . HTN (hypertension)   . Ulcer    Dietary habits include: un and healthy diet t Exercise habits include walking daily  Family / Social history: mother stroke   ASCVD risk factors include- The 10-year ASCVD risk score Marland Kitchen DC Jr., et al., 2013) is: 16.4%   Values used to calculate the score:     Age: 59 years     Sex: Male     Is Non-Hispanic African American: Yes     Diabetic: No     Tobacco smoker: Yes     Systolic Blood Pressure: 148 mmHg     Is BP treated: No     HDL Cholesterol: 56 mg/dL     Total Cholesterol: 152 mg/dL O:  Physical Exam Vitals reviewed.  Constitutional:      Appearance: Normal appearance.  HENT:     Head: Normocephalic.     Nose: Nose normal.  Cardiovascular:     Rate and Rhythm: Normal rate and regular rhythm.  Pulmonary:     Effort: Pulmonary effort is normal.     Breath sounds: Normal breath sounds.  Abdominal:     General: Bowel sounds are normal.   Musculoskeletal:        General: Normal range of motion.     Cervical back: Normal range of motion and neck supple.  Skin:    General: Skin is warm and dry.  Neurological:     Mental Status: He is alert and oriented to person, place, and time.  Psychiatric:        Mood and Affect: Mood normal.        Behavior: Behavior normal.        Thought Content: Thought content normal.        Judgment: Judgment normal.      ROS  Last 3 Office BP readings: BP Readings from Last 3 Encounters:  01/13/20 (!) 148/105  12/16/19 (!) 147/102  12/02/19 105/79    BMET    Component Value Date/Time   NA 140 12/16/2019 1106   K 4.0 12/16/2019 1106   CL 105 12/16/2019 1106   CO2 21 12/16/2019 1106   GLUCOSE 100 (H) 12/16/2019 1106   GLUCOSE 124 (H) 12/02/2019 0357   BUN 6 (L) 12/16/2019 1106   CREATININE 0.91 12/16/2019 1106   CALCIUM 8.4 (L) 12/16/2019 1106   GFRNONAA 91 12/16/2019 1106   GFRAA 105  12/16/2019 1106    Renal function: CrCl cannot be calculated (Patient's most recent lab result is older than the maximum 21 days allowed.).  Clinical ASCVD: Yes  The 10-year ASCVD risk score Denman George DC Jr., et al., 2013) is: 16.4%   Values used to calculate the score:     Age: 20 years     Sex: Male     Is Non-Hispanic African American: Yes     Diabetic: No     Tobacco smoker: Yes     Systolic Blood Pressure: 148 mmHg     Is BP treated: No     HDL Cholesterol: 56 mg/dL     Total Cholesterol: 152 mg/dL   A/P: Essential hypertension Hypertension longstanding diagnosed currently nothing  on current medications. BP Goal = 130/80 mmHg. Patient is adherent with current medications. Cholesterol and Antidepressant  -Started amlodipine 10 mg and HCTZ 25 mg daily -F/u labs ordered not indicated  -Counseled on lifestyle modifications for blood pressure control including reduced dietary sodium, increased exercise, adequate sleep  TOBACCO ABUSE Increased risk for lung cancer and other  respiratory diseases recommend cessation.  This will be reminded at each clinical visit.   Grayce Sessions

## 2020-01-14 ENCOUNTER — Telehealth (INDEPENDENT_AMBULATORY_CARE_PROVIDER_SITE_OTHER): Payer: Self-pay | Admitting: Primary Care

## 2020-01-14 NOTE — Telephone Encounter (Signed)
Patient's wife, Will Heinkel is calling regarding patient's BP medication. Patient was seen yesterday for Essential hypertension +1 more. Wife is unsure if medication needed to be adjusted. Can medication be called in please? Preferred Pharmacy- [facility]   [phone]

## 2020-01-17 ENCOUNTER — Other Ambulatory Visit (INDEPENDENT_AMBULATORY_CARE_PROVIDER_SITE_OTHER): Payer: Self-pay | Admitting: Primary Care

## 2020-01-17 DIAGNOSIS — I10 Essential (primary) hypertension: Secondary | ICD-10-CM

## 2020-01-17 MED ORDER — AMLODIPINE BESYLATE 10 MG PO TABS: 10.0000 mg | ORAL_TABLET | Freq: Every day | ORAL | 3 refills | Status: DC

## 2020-01-17 NOTE — Telephone Encounter (Signed)
Called patients wife. There was some confusion with PCP note so transferred call to PCP.

## 2020-01-17 NOTE — Telephone Encounter (Signed)
Patient is calling back to get clarification on what he should do about his BP medication.  He still has not hear from the nurse or doctor.  CB# [phone]

## 2020-01-18 NOTE — Telephone Encounter (Signed)
Patient has allergies to HCTZ sent only amlodipine 10 mg monitor salt in diet . Check blood pressure daily at the same time. Keep a log of all blood pressure readings.  DASH DIET; No salt or low sodium diet  If pressure if greater than 150/100 notify me here at the office.  If it's persistently greater 180/90, go to the Emergency Department.  Take all medication as prescribed.   Avoid smoked meats which are high in sodium content.  Avoid soda which contains sodium and are high in sugar which increases your risk for diabetes.

## 2020-02-21 ENCOUNTER — Ambulatory Visit (INDEPENDENT_AMBULATORY_CARE_PROVIDER_SITE_OTHER): Payer: Self-pay

## 2020-02-21 ENCOUNTER — Telehealth: Payer: Self-pay | Admitting: Primary Care

## 2020-02-21 MED FILL — AMLODIPINE BESYLATE 10 MG T: 10 | 30 days supply | Qty: 30 | Fill #0

## 2020-02-21 NOTE — Telephone Encounter (Signed)
Patient called stating that he is anxious. He feels the walls closing in when he sits down. He is outside walking now He states he has no suicidal or homicidal thoughts. He states he is tired because he can't sit without feeling closed in. He denies any triggers for this anxiety. He is on anxiety medication and has not missed a dose. He states he bought alcohol but did not drink it. He has "no taste for it". Per protocol patient needs seen but no appointments are available. Patient place his daughter on the phone and she will take him to [facility] for evaluation. Care advice and [facility] for anxiety text number given to daughter. She verbalized understanding of all information.  Reason for Disposition  Patient sounds very upset or troubled to the triager  Answer Assessment - Initial Assessment Questions 1. CONCERN: "What happened that made you call today?"     livingroom closing in on him 2. ANXIETY SYMPTOM SCREENING: "Can you describe how you have been feeling?"  (e.g., tense, restless, panicky, anxious, keyed up, trouble sleeping, trouble concentrating)     Keyed up unable to sleep last night, anxious 3. ONSET: "How long have you been feeling this way?"     Just today 4. RECURRENT: "Have you felt this way before?"  If Yes, ask: "What happened that time?" "What helped these feelings go away in the past?"     Yes in past 5. RISK OF HARM - SUICIDAL IDEATION:  "Do you ever have thoughts of hurting or killing yourself?"  (e.g., yes, no, no but preoccupation with thoughts about death)   - INTENT:  "Do you have thoughts of hurting or killing yourself right NOW?" (e.g., yes, no, N/A)   - PLAN: "Do you have a specific plan for how you would do this?" (e.g., gun, knife, overdose, no plan, N/A)    no 6. RISK OF HARM - HOMICIDAL IDEATION:  "Do you ever have thoughts of hurting or killing someone else?"  (e.g., yes, no, no but preoccupation with thoughts about death)   - INTENT:  "Do you have thoughts of hurting or  killing someone right NOW?" (e.g., yes, no, N/A)   - PLAN: "Do you have a specific plan for how you would do this?" (e.g., gun, knife, no plan, N/A)     No 7. FUNCTIONAL IMPAIRMENT: "How have things been going for you overall? Have you had more difficulty than usual doing your normal daily activities?"  (e.g., better, same, worse; self-care, school, work, interactions)     Worse tody 8. SUPPORT: "Who is with you now?" "Who do you live with?" "Do you have family or friends who you can talk to?"      wife 9. THERAPIST: "Do you have a counselor or therapist? Name?"    No 10. STRESSORS: "Has there been any new stress or recent changes in your life?"      No new stressors 11. CAFFEINE USE: "Do you drink caffeinated beverages, and how much each day?" (e.g., coffee, tea, colas)      no 12. ALCOHOL USE OR SUBSTANCE USE (DRUG USE): "Do you drink alcohol or use any illegal drugs?"       This morning today but didn't have taste for it 13. OTHER SYMPTOMS: "Do you have any other physical symptoms right now?" (e.g., chest pain, palpitations, difficulty breathing, fever)      Hard to breath when indoors 14. PREGNANCY: "Is there any chance you are pregnant?" "When was your last menstrual period?"  N/A  Protocols used: ANXIETY AND PANIC ATTACK-A-AH

## 2020-02-21 NOTE — Telephone Encounter (Signed)
Copied from CRM [id number]. Topic: General - Other >> Feb 21, 2020  2:19 PM Dalphine Handing A wrote: Patients wife wants to know if patient is suppose to be taking the lexapro. Please advise

## 2020-02-23 ENCOUNTER — Other Ambulatory Visit (INDEPENDENT_AMBULATORY_CARE_PROVIDER_SITE_OTHER): Payer: Self-pay | Admitting: Primary Care

## 2020-02-23 DIAGNOSIS — F418 Other specified anxiety disorders: Secondary | ICD-10-CM

## 2020-02-23 MED ORDER — ESCITALOPRAM OXALATE 20 MG PO TABS: 20.0000 mg | ORAL_TABLET | Freq: Every day | ORAL | 1 refills | Status: DC

## 2020-02-23 NOTE — Telephone Encounter (Signed)
Patients wife is aware that he is to be taking Lexapro. She needs Rx resent to [facility] pharmacy. He is about to run out and Rx was sent to wrong pharmacy back in may.

## 2020-02-24 MED FILL — ESCITALOPRAM 20 MG TABLET: 20 | 30 days supply | Qty: 30 | Fill #0

## 2020-03-14 ENCOUNTER — Encounter (INDEPENDENT_AMBULATORY_CARE_PROVIDER_SITE_OTHER): Payer: Self-pay | Admitting: Primary Care

## 2020-03-14 ENCOUNTER — Other Ambulatory Visit: Payer: Self-pay

## 2020-03-14 ENCOUNTER — Ambulatory Visit (INDEPENDENT_AMBULATORY_CARE_PROVIDER_SITE_OTHER): Payer: Self-pay | Admitting: Primary Care

## 2020-03-14 VITALS — BP 128/88 | HR 109 | Temp 98.4°F | Ht 63.0 in | Wt 159.4 lb

## 2020-03-14 DIAGNOSIS — E119 Type 2 diabetes mellitus without complications: Secondary | ICD-10-CM

## 2020-03-14 DIAGNOSIS — F17200 Nicotine dependence, unspecified, uncomplicated: Secondary | ICD-10-CM

## 2020-03-14 DIAGNOSIS — I10 Essential (primary) hypertension: Secondary | ICD-10-CM

## 2020-03-14 LAB — POCT GLYCOSYLATED HEMOGLOBIN (HGB A1C): Hemoglobin A1C: 5.4 % (ref 4.0–5.6)

## 2020-03-14 NOTE — Patient Instructions (Addendum)
Preventing Hypertension Hypertension, commonly called high blood pressure, is when the force of blood pumping through the arteries is too strong. Arteries are blood vessels that carry blood from the heart throughout the body. Over time, hypertension can damage the arteries and decrease blood flow to important parts of the body, including the brain, heart, and kidneys. Often, hypertension does not cause symptoms until blood pressure is very high. For this reason, it is important to have your blood pressure checked on a regular basis. Hypertension can often be prevented with diet and lifestyle changes. If you already have hypertension, you can control it with diet and lifestyle changes, as well as medicine. What nutrition changes can be made? Maintain a healthy diet. This includes:  Eating less salt (sodium). Ask your health care provider how much sodium is safe for you to have. The general recommendation is to consume less than 1 tsp (2,300 mg) of sodium a day. ? Do not add salt to your food. ? Choose low-sodium options when grocery shopping and eating out.  Limiting fats in your diet. You can do this by eating low-fat or fat-free dairy products and by eating less red meat.  Eating more fruits, vegetables, and whole grains. Make a goal to eat: ? 1-2 cups of fresh fruits and vegetables each day. ? 3-4 servings of whole grains each day.  Avoiding foods and beverages that have added sugars.  Eating fish that contain healthy fats (omega-3 fatty acids), such as mackerel or salmon. If you need help putting together a healthy eating plan, try the DASH diet. This diet is high in fruits, vegetables, and whole grains. It is low in sodium, red meat, and added sugars. DASH stands for Dietary Approaches to Stop Hypertension. What lifestyle changes can be made?   Lose weight if you are overweight. Losing just 3?5% of your body weight can help prevent or control hypertension. ? For example, if your present  weight is 200 lb (91 kg), a loss of 3-5% of your weight means losing 6-10 lb (2.7-4.5 kg). ? Ask your health care provider to help you with a diet and exercise plan to safely lose weight.  Get enough exercise. Do at least 150 minutes of moderate-intensity exercise each week. ? You could do this in short exercise sessions several times a day, or you could do longer exercise sessions a few times a week. For example, you could take a brisk 10-minute walk or bike ride, 3 times a day, for 5 days a week.  Find ways to reduce stress, such as exercising, meditating, listening to music, or taking a yoga class. If you need help reducing stress, ask your health care provider.  Do not smoke. This includes e-cigarettes. Chemicals in tobacco and nicotine products raise your blood pressure each time you smoke. If you need help quitting, ask your health care provider.  Avoid alcohol. If you drink alcohol, limit alcohol intake to no more than 1 drink a day for nonpregnant women and 2 drinks a day for men. One drink equals 12 oz of beer, 5 oz of wine, or 1 oz of hard liquor. Why are these changes important? Diet and lifestyle changes can help you prevent hypertension, and they may make you feel better overall and improve your quality of life. If you have hypertension, making these changes will help you control it and help prevent major complications, such as:  Hardening and narrowing of arteries that supply blood to: ? Your heart. This can cause a heart   attack. ? Your brain. This can cause a stroke. ? Your kidneys. This can cause kidney failure.  Stress on your heart muscle, which can cause heart failure. What can I do to lower my risk?  Work with your health care provider to make a hypertension prevention plan that works for you. Follow your plan and keep all follow-up visits as told by your health care provider.  Learn how to check your blood pressure at home. Make sure that you know your personal target  blood pressure, as told by your health care provider. How is this treated? In addition to diet and lifestyle changes, your health care provider may recommend medicines to help lower your blood pressure. You may need to try a few different medicines to find what works best for you. You also may need to take more than one medicine. Take over-the-counter and prescription medicines only as told by your health care provider. Where to find support Your health care provider can help you prevent hypertension and help you keep your blood pressure at a healthy level. Your local hospital or your community may also provide support services and prevention programs. The American Heart Association offers an online support network at: http://supportnetwork.heart.org/high-blood-pressure Where to find more information Learn more about hypertension from:  National Heart, Lung, and Blood Institute: www.nhlbi.nih.gov/health/health-topics/topics/hbp  Centers for Disease Control and Prevention: www.cdc.gov/bloodpressure  American Academy of Family Physicians: http://familydoctor.org/familydoctor/en/diseases-conditions/high-blood-pressure.printerview.all.html Learn more about the DASH diet from:  National Heart, Lung, and Blood Institute: www.nhlbi.nih.gov/health/health-topics/topics/dash Contact a health care provider if:  You think you are having a reaction to medicines you have taken.  You have recurrent headaches or feel dizzy.  You have swelling in your ankles.  You have trouble with your vision. Summary  Hypertension often does not cause any symptoms until blood pressure is very high. It is important to get your blood pressure checked regularly.  Diet and lifestyle changes are the most important steps in preventing hypertension.  By keeping your blood pressure in a healthy range, you can prevent complications like heart attack, heart failure, stroke, and kidney failure.  Work with your health care  provider to make a hypertension prevention plan that works for you. This information is not intended to replace advice given to you by your health care provider. Make sure you discuss any questions you have with your health care provider. Document Revised: 09/18/2018 Document Reviewed: 02/05/2016 Elsevier Patient Education  2020 Elsevier Inc.  

## 2020-03-14 NOTE — Progress Notes (Signed)
[facility]    [address] [place] presents for  hypertension evaluation, on previous visit medication was adjusted to include started taking amlodipine 10mg . Patient reports adherence with medications.  He reports having a couple of days increase anxiety unable to keep stable then will he was not taking his Lexapro 20 mg at bedtime.  Once he restarted his medication the symptoms ceased  Current Medication List Current Outpatient Medications on File Prior to Visit  Medication Sig Dispense Refill  . amLODipine (NORVASC) 10 MG tablet Take 1 tablet (10 mg total) by mouth daily. 90 tablet 3  . aspirin 81 MG chewable tablet Chew 4 tablets (324 mg total) by mouth daily. (Patient taking differently: Chew 81 mg by mouth daily. ) 30 tablet 11  . atorvastatin (LIPITOR) 20 MG tablet Take 1 tablet (20 mg total) by mouth daily. 90 tablet 3  . escitalopram (LEXAPRO) 20 MG tablet Take 1 tablet (20 mg total) by mouth daily. 90 tablet 1   No current facility-administered medications on file prior to visit.   Past Medical History  Past Medical History:  Diagnosis Date  . 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 GERD (gastroesophageal reflux disease)   . History of blood transfusion   . HTN (hypertension)   . Ulcer    Dietary habits include:healthy  Diet  Exercise habits include walking  Family / Social history: unknown ASCVD risk factors include-  O:  Physical Exam Vitals reviewed.  HENT:     Head: Normocephalic.  Eyes:     Extraocular Movements: Extraocular movements intact.  Cardiovascular:     Rate and Rhythm: Normal rate and regular rhythm.  Pulmonary:     Effort: Pulmonary effort is normal.     Breath sounds: Wheezing present.     Comments: RLL Abdominal:     General: Bowel sounds are normal.  Musculoskeletal:        General: Normal range of motion.     Cervical back: Normal range of motion.  Skin:    General: Skin is warm and dry.  Neurological:     Mental  Status: He is alert and oriented to person, place, and time.  Psychiatric:        Mood and Affect: Mood normal.        Behavior: Behavior normal.        Thought Content: Thought content normal.        Judgment: Judgment normal.      Review of Systems  Psychiatric/Behavioral: The patient is nervous/anxious.        Self resolved once started back on Lexapro  All other systems reviewed and are negative.   Last 3 Office BP readings: BP Readings from Last 3 Encounters:  03/14/20 128/88  01/13/20 (!) 148/105  12/16/19 (!) 147/102    BMET    Component Value Date/Time   NA 140 12/16/2019 1106   K 4.0 12/16/2019 1106   CL 105 12/16/2019 1106   CO2 21 12/16/2019 1106   GLUCOSE 100 (H) 12/16/2019 1106   GLUCOSE 124 (H) 12/02/2019 0357   BUN 6 (L) 12/16/2019 1106   CREATININE 0.91 12/16/2019 1106   CALCIUM 8.4 (L) 12/16/2019 1106   GFRNONAA 91 12/16/2019 1106   GFRAA 105 12/16/2019 1106    Renal function: CrCl cannot be calculated (Patient's most recent lab result is older than the maximum 21 days allowed.).  Clinical ASCVD: Yes  The 10-year ASCVD risk score 02/16/2020 DC Denman George., et al., 2013)  is: 35.6%   Values used to calculate the score:     Age: 47 years     Sex: Male     Is Non-Hispanic African American: Yes     Diabetic: Yes     Tobacco smoker: Yes     Systolic Blood Pressure: 128 mmHg     Is BP treated: Yes     HDL Cholesterol: 56 mg/dL     Total Cholesterol: 152 mg/dL Loyalty was seen today for blood pressure check.  Diagnoses and all orders for this visit:  Type 2 diabetes mellitus without complication, without long-term current use of insulin (HCC) -     HgB A1c 5.4 previously 3 months ago 6.6 improved ADA For Prediabetes Is 5.7-6.4. No longer classified as a diabetic or prediabetes.  Patient will continue to monitor his carbohydrate intake and exercise will check A1c in 6 to 12 months  Essential hypertension Hypertension longstanding diagnosed currently amlodipine  10 mg daily on current medications. BP Goal 130/80 mmHg. Patient is adherent with current medications.  -Continued  -F/u labs ordered - none -Counseled on lifestyle modifications for blood pressure control including reduced dietary sodium, increased exercise, adequate sleep  TOBACCO ABUSE Patient is aware of risk for increase respiratory infections and lung cancer . Each visit will discuss cessation.  Grayce Sessions

## 2020-05-08 MED FILL — ESCITALOPRAM 20 MG TABLET: 20 | 30 days supply | Qty: 30 | Fill #1

## 2020-07-04 ENCOUNTER — Other Ambulatory Visit (INDEPENDENT_AMBULATORY_CARE_PROVIDER_SITE_OTHER): Payer: Self-pay | Admitting: Primary Care

## 2020-07-04 DIAGNOSIS — I10 Essential (primary) hypertension: Secondary | ICD-10-CM

## 2020-07-04 MED FILL — AMLODIPINE BESYLATE 10 MG T: 10 | 30 days supply | Qty: 30 | Fill #0

## 2020-07-04 MED FILL — ESCITALOPRAM 20 MG TABLET: 20 | 30 days supply | Qty: 30 | Fill #2

## 2020-07-04 NOTE — Telephone Encounter (Signed)
Medication Refill - Medication: amLODipine (NORVASC) 10 MG tablet    (pt's wife wants this sent to new pharmacy listed below)   Pt is completely out of his current supply  Has the patient contacted their pharmacy? No. (Agent: If no, request that the patient contact the pharmacy for the refill.) (Agent: If yes, when and what did the pharmacy advise?)  Preferred Pharmacy (with phone number or street name):  [facility] - [place], [place] - [place] [address]  [address] [place] [address]  Phone: [phone] Fax: [phone]     Agent: Please be advised that RX refills may take up to 3 business days. We ask that you follow-up with your pharmacy.

## 2020-07-05 ENCOUNTER — Other Ambulatory Visit (INDEPENDENT_AMBULATORY_CARE_PROVIDER_SITE_OTHER): Payer: Self-pay | Admitting: Family Medicine

## 2020-07-05 MED ORDER — AMLODIPINE BESYLATE 10 MG PO TABS: 10.0000 mg | ORAL_TABLET | Freq: Every day | ORAL | 2 refills | Status: DC

## 2020-08-10 ENCOUNTER — Other Ambulatory Visit (INDEPENDENT_AMBULATORY_CARE_PROVIDER_SITE_OTHER): Payer: Self-pay | Admitting: Primary Care

## 2020-08-10 ENCOUNTER — Telehealth: Payer: Self-pay | Admitting: Primary Care

## 2020-08-10 MED ORDER — ATORVASTATIN CALCIUM 20 MG PO TABS: 20.0000 mg | ORAL_TABLET | Freq: Every day | ORAL | 0 refills | Status: DC

## 2020-08-10 MED FILL — AMLODIPINE BESYLATE 10 MG T: 10 | 30 days supply | Qty: 30 | Fill #1

## 2020-08-10 MED FILL — ESCITALOPRAM 20 MG TABLET: 20 | 30 days supply | Qty: 30 | Fill #3

## 2020-08-10 MED FILL — ATORVASTATIN CALCIUM 20 MG: 20 | 30 days supply | Qty: 30 | Fill #0

## 2020-08-10 NOTE — Telephone Encounter (Signed)
Medication Refill - Medication: atorvastatin (LIPITOR) 20 MG tablet  Has two pills   Has the patient contacted their pharmacy? Yes.   (Agent: If no, request that the patient contact the pharmacy for the refill.) (Agent: If yes, when and what did the pharmacy advise?)  Preferred Pharmacy (with phone number or street name):  [facility] - [place], [place] - [place] [address]  [address] [place] [address]  Phone: [phone] Fax: [phone]     Agent: Please be advised that RX refills may take up to 3 business days. We ask that you follow-up with your pharmacy.

## 2020-08-10 NOTE — Telephone Encounter (Signed)
Future visit in 1 month  

## 2020-08-10 NOTE — Telephone Encounter (Signed)
Called patient wife and LVM to return call and schedule an appointment with PCP to address the concerns.    Copied from CRM [id number]. Topic: General - Call Back - No Documentation >> Aug 10, 2020 11:15 AM Randol Kern wrote: Confidential message for PCP. From Wife  Best contact: [phone] "Patient has been having fainting spells, and he suffers from memory loss. Sometimes he repeats himself"

## 2020-08-22 ENCOUNTER — Other Ambulatory Visit: Payer: Self-pay

## 2020-08-22 ENCOUNTER — Other Ambulatory Visit (INDEPENDENT_AMBULATORY_CARE_PROVIDER_SITE_OTHER): Payer: Self-pay | Admitting: Primary Care

## 2020-08-22 ENCOUNTER — Encounter (INDEPENDENT_AMBULATORY_CARE_PROVIDER_SITE_OTHER): Payer: Self-pay | Admitting: Primary Care

## 2020-08-22 ENCOUNTER — Ambulatory Visit (INDEPENDENT_AMBULATORY_CARE_PROVIDER_SITE_OTHER): Payer: Self-pay | Admitting: Primary Care

## 2020-08-22 VITALS — BP 130/90 | HR 98 | Temp 97.3°F | Ht 63.0 in | Wt 164.2 lb

## 2020-08-22 DIAGNOSIS — F418 Other specified anxiety disorders: Secondary | ICD-10-CM

## 2020-08-22 DIAGNOSIS — E782 Mixed hyperlipidemia: Secondary | ICD-10-CM

## 2020-08-22 DIAGNOSIS — F1010 Alcohol abuse, uncomplicated: Secondary | ICD-10-CM

## 2020-08-22 DIAGNOSIS — I10 Essential (primary) hypertension: Secondary | ICD-10-CM

## 2020-08-22 DIAGNOSIS — Z760 Encounter for issue of repeat prescription: Secondary | ICD-10-CM

## 2020-08-22 MED ORDER — ESCITALOPRAM OXALATE 20 MG PO TABS: 20.0000 mg | ORAL_TABLET | Freq: Every day | ORAL | 1 refills | Status: DC

## 2020-08-22 MED ORDER — ATORVASTATIN CALCIUM 20 MG PO TABS: 20.0000 mg | ORAL_TABLET | Freq: Every day | ORAL | 1 refills | Status: DC

## 2020-08-22 MED ORDER — AMLODIPINE BESYLATE 10 MG PO TABS: 10.0000 mg | ORAL_TABLET | Freq: Every day | ORAL | 1 refills | Status: DC

## 2020-08-22 NOTE — Progress Notes (Signed)
Established Patient Office Visit  Subjective:  Patient ID: Casey Warren, male    DOB: Oct 18, 1960  Age: 60 y.o. MRN: [id number]  CC:  Chief Complaint  Patient presents with  . Hypertension    HPI Mr. Casey Warren is presents for hypertension management Denies shortness of breath, headaches, chest pain or lower extremity edema, sudden onset, vision changes, unilateral weakness, dizziness, paresthesias. Patient continues to drink "a" beer daily- wife is present and states he has 3 (12oz) daily we discussed criteria for dx alcoholic - waking up first thing he does is drink a beer. Diseases caused for drinking alcohol. Smokes 4 black n mild daily.   Past Medical History:  Diagnosis Date  . 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 GERD (gastroesophageal reflux disease)   . History of blood transfusion   . HTN (hypertension)   . Ulcer     Past Surgical History:  Procedure Laterality Date  . CLOSED REDUCTION NASAL FRACTURE  11/19/2011   Procedure: CLOSED REDUCTION NASAL FRACTURE;  Surgeon: Flo Shanks, MD;  Location: [facility] OR;  Service: ENT;  Laterality: N/A;  CLOSED REDUCTION NASAL AND NASAL SEPTAL FRACTURE WITH STABILIZATION  . No surgical      Family History  Problem Relation Age of Onset  . Stroke Mother        Died age 40  . Hypertension Mother   . Diabetes Mother   . Breast cancer Mother   . Leukemia Father        Died age 84    Social History   Socioeconomic History  . Marital status: Married    Spouse name: Not on file  . Number of children: 3  . Years of education: Not on file  . Highest education level: Not on file  Occupational History  . Not on file  Tobacco Use  . Smoking status: Current Every Day Smoker    Packs/day: 0.50    Types: Cigars, Cigarettes  . Smokeless tobacco: Never Used  . Tobacco comment: 2 black-n-milds   Vaping Use  . Vaping Use: Never used  Substance and Sexual Activity  . Alcohol use: Yes    Comment: Two cans of beer dialy.   . Drug  use: No  . Sexual activity: Not on file  Other Topics Concern  . 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 amLODipine (NORVASC) 10 MG tablet Take 1 tablet (10 mg total) by mouth daily. 30 tablet 2  . aspirin 81 MG chewable tablet Chew 4 tablets (324 mg total) by mouth daily. (Patient taking differently: Chew 81 mg by mouth daily.) 30 tablet 11  . atorvastatin (LIPITOR) 20 MG tablet Take 1 tablet (20 mg total) by mouth daily. 90 tablet 0  . escitalopram (LEXAPRO) 20 MG tablet Take 1 tablet (20 mg total) by mouth daily. 90 tablet 1   No facility-administered medications prior to visit.    Allergies  Allergen Reactions  . Citrus   . Chlorthalidone     Hyponatremia    ROS Review of Systems  All other systems reviewed and are negative.     Objective:    Physical Exam Vitals reviewed.  Constitutional:      Appearance: Normal appearance.  HENT:     Head: Normocephalic.     Right Ear: Tympanic membrane and external ear normal.     Left Ear: Tympanic membrane and external ear normal.     Nose: Nose normal.  Eyes:     Extraocular Movements: Extraocular movements intact.     Pupils: Pupils are equal, round, and reactive to light.  Cardiovascular:     Rate and Rhythm: Normal rate and regular rhythm.  Pulmonary:     Effort: Pulmonary effort is normal.     Breath sounds: Normal breath sounds.  Abdominal:     General: Bowel sounds are normal.     Palpations: Abdomen is soft.   Musculoskeletal:        General: Normal range of motion.     Cervical back: Normal range of motion and neck supple.  Skin:    General: Skin is warm and dry.  Neurological:     Mental Status: He is alert and oriented to person, place, and time.  Psychiatric:        Mood and Affect: Mood normal.        Behavior: Behavior normal.        Thought Content: Thought content normal.        Judgment: Judgment normal.     BP 130/90 (BP Location: Right Arm, Patient Position: Sitting, Cuff Size: Normal)   Pulse 98   Temp (!) 97.3 F (36.3 C) (Temporal)   Ht 5\' 3"  (1.6 m)   Wt 164 lb 3.2 oz (74.5 kg)   SpO2 96%   BMI 29.09 kg/m  Wt Readings from Last 3 Encounters:  08/22/20 164 lb 3.2 oz (74.5 kg)  03/14/20 159 lb 6.4 oz (72.3 kg)  01/13/20 158 lb 6.4 oz (71.8 kg)     Health Maintenance Due  Topic Date Due  . COVID-19 Vaccine (1) Never done  . URINE MICROALBUMIN  Never done    There are no preventive care reminders to display for this patient.  Lab Results  Component Value Date   TSH 2.200 02/05/2017   Lab Results  Component Value Date   WBC 3.9 (L) 12/02/2019   HGB 13.3 12/02/2019   HCT 37.6 (L) 12/02/2019   MCV 90.0 12/02/2019   PLT 273 12/02/2019   Lab Results  Component Value Date   NA 140 12/16/2019   K 4.0 12/16/2019   CO2 21 12/16/2019   GLUCOSE 100 (H) 12/16/2019   BUN 6 (L) 12/16/2019   CREATININE 0.91 12/16/2019   BILITOT 0.4 12/16/2019   ALKPHOS 118 12/16/2019   AST 20 12/16/2019   ALT 22 12/16/2019   PROT 6.5 12/16/2019   ALBUMIN 3.9 12/16/2019   CALCIUM 8.4 (L) 12/16/2019   ANIONGAP 6 12/02/2019   Lab Results  Component Value Date   CHOL 152 10/11/2019   Lab Results  Component Value Date   HDL 56 10/11/2019   Lab Results  Component Value Date   LDLCALC 85 10/11/2019   Lab Results  Component Value Date   TRIG 49 10/11/2019   Lab Results  Component Value Date   CHOLHDL 2.7 10/11/2019   Lab Results  Component Value Date   HGBA1C  5.4  03/14/2020      Assessment & Plan:  Casey Warren was seen today for hypertension.  Diagnoses and all orders for this visit:  Alcohol abuse Discuss treatment at this time he is not interested in stop enjoying his beers.  Anxiety with depression States can tell the difference calmer  -     escitalopram (LEXAPRO) 20 MG tablet; Take 1 tablet (20 mg total) by mouth daily.  Essential hypertension Smoked Black n Mild before visit may contributed to slight increase Counseled on blood pressure goal of less than 130/80, low-sodium, DASH diet, medication compliance, 150 minutes of moderate intensity exercise per week. Discussed medication compliance, adverse effects. -     amLODipine (NORVASC) 10 MG tablet; Take 1 tablet (10 mg total) by mouth daily.  Mixed hyperlipidemia  Decrease your fatty foods, red meat, cheese, milk and increase fiber like whole grains and veggies. Sporadic eating pattern and snacking  -     atorvastatin (LIPITOR) 20 MG tablet; Take 1 tablet (20 mg total) by mouth daily.  Medication refill -     atorvastatin (LIPITOR) 20 MG tablet; Take 1 tablet (20 mg total) by mouth daily. -     escitalopram (LEXAPRO) 20 MG tablet; Take 1 tablet (20 mg total) by mouth daily. -     amLODipine (NORVASC) 10 MG tablet; Take 1 tablet (10 mg total) by mouth daily.    Follow-up: No follow-ups on file.    Grayce Sessions, NP

## 2020-09-09 ENCOUNTER — Other Ambulatory Visit: Payer: Self-pay

## 2020-09-11 ENCOUNTER — Other Ambulatory Visit: Payer: Self-pay

## 2020-09-11 MED FILL — Atorvastatin Calcium Tab 20 MG (Base Equivalent): ORAL | 30 days supply | Qty: 30 | Fill #0 | Status: AC

## 2020-09-11 MED FILL — Escitalopram Oxalate Tab 20 MG (Base Equiv): ORAL | 30 days supply | Qty: 30 | Fill #0 | Status: AC

## 2020-09-11 MED FILL — Amlodipine Besylate Tab 10 MG (Base Equivalent): ORAL | 30 days supply | Qty: 30 | Fill #0 | Status: AC

## 2020-09-12 ENCOUNTER — Other Ambulatory Visit: Payer: Self-pay

## 2020-09-12 ENCOUNTER — Ambulatory Visit (INDEPENDENT_AMBULATORY_CARE_PROVIDER_SITE_OTHER): Payer: Self-pay | Admitting: Primary Care

## 2020-09-14 ENCOUNTER — Other Ambulatory Visit: Payer: Self-pay

## 2020-10-16 ENCOUNTER — Other Ambulatory Visit (INDEPENDENT_AMBULATORY_CARE_PROVIDER_SITE_OTHER): Payer: Self-pay | Admitting: Family Medicine

## 2020-10-16 MED FILL — Atorvastatin Calcium Tab 20 MG (Base Equivalent): ORAL | 30 days supply | Qty: 30 | Fill #1 | Status: AC

## 2020-10-16 MED FILL — Escitalopram Oxalate Tab 20 MG (Base Equiv): ORAL | 30 days supply | Qty: 30 | Fill #1 | Status: AC

## 2020-10-17 ENCOUNTER — Other Ambulatory Visit: Payer: Self-pay

## 2020-10-18 ENCOUNTER — Other Ambulatory Visit: Payer: Self-pay

## 2020-10-20 ENCOUNTER — Other Ambulatory Visit: Payer: Self-pay

## 2020-10-20 ENCOUNTER — Other Ambulatory Visit (INDEPENDENT_AMBULATORY_CARE_PROVIDER_SITE_OTHER): Payer: Self-pay | Admitting: Primary Care

## 2020-10-20 NOTE — Telephone Encounter (Signed)
Requested medication (s) are due for refill today: Yes  Requested medication (s) are on the active medication list: Yes  Last refill:  10/12/19  Future visit scheduled: Yes  Notes to clinic:  Last filled by Joaquin Courts.    Requested Prescriptions  Pending Prescriptions Disp Refills   amLODipine (NORVASC) 10 MG tablet 90 tablet 3    Sig: TAKE 1 TABLET (10 MG TOTAL) BY MOUTH DAILY.      Cardiovascular:  Calcium Channel Blockers Failed - 10/20/2020  2:43 PM      Failed - Last BP in normal range    BP Readings from Last 1 Encounters:  08/22/20 130/90          Passed - Valid encounter within last 6 months    Recent Outpatient Visits           1 month ago Alcohol abuse   [facility] Gwinda Passe P, NP   7 months ago Type 2 diabetes mellitus without complication, without long-term current use of insulin (HCC)   [facility] Grayce Sessions, NP   9 months ago Essential hypertension   [facility] Grayce Sessions, NP   10 months ago Essential hypertension   [facility] Grayce Sessions, NP   11 months ago Essential hypertension   [facility] Grayce Sessions, NP       Future Appointments             In 4 months Randa Evens, Kinnie Scales, NP [facility]

## 2020-10-20 NOTE — Telephone Encounter (Signed)
Requested medication (s) are due for refill today: yes   Requested medication (s) are on the active medication list: yes  Last refill : 09/14/2020  Future visit scheduled: yes  Notes to clinic:  Review for refill  Filled by another provider    Requested Prescriptions  Pending Prescriptions Disp Refills   amLODipine (NORVASC) 10 MG tablet 90 tablet 3    Sig: TAKE 1 TABLET (10 MG TOTAL) BY MOUTH DAILY.      Cardiovascular:  Calcium Channel Blockers Failed - 10/20/2020  2:43 PM      Failed - Last BP in normal range    BP Readings from Last 1 Encounters:  08/22/20 130/90          Passed - Valid encounter within last 6 months    Recent Outpatient Visits           1 month ago Alcohol abuse   [facility] Gwinda Passe P, NP   7 months ago Type 2 diabetes mellitus without complication, without long-term current use of insulin (HCC)   [facility] Grayce Sessions, NP   9 months ago Essential hypertension   [facility] Grayce Sessions, NP   10 months ago Essential hypertension   [facility] Grayce Sessions, NP   11 months ago Essential hypertension   [facility] Grayce Sessions, NP       Future Appointments             In 4 months Randa Evens, Kinnie Scales, NP [facility]

## 2020-10-22 MED ORDER — AMLODIPINE BESYLATE 10 MG PO TABS
ORAL_TABLET | Freq: Every day | ORAL | 3 refills | Status: DC
  Filled 2020-10-22: qty 30, 30d supply, fill #0
  Filled 2020-11-22: qty 30, 30d supply, fill #1
  Filled 2020-12-26: qty 30, 30d supply, fill #2
  Filled 2021-01-30: qty 30, 30d supply, fill #3

## 2020-10-23 ENCOUNTER — Other Ambulatory Visit: Payer: Self-pay

## 2020-10-25 ENCOUNTER — Other Ambulatory Visit: Payer: Self-pay

## 2020-11-22 ENCOUNTER — Other Ambulatory Visit: Payer: Self-pay

## 2020-11-22 MED FILL — Atorvastatin Calcium Tab 20 MG (Base Equivalent): ORAL | 30 days supply | Qty: 30 | Fill #2 | Status: AC

## 2020-11-22 MED FILL — Escitalopram Oxalate Tab 20 MG (Base Equiv): ORAL | 30 days supply | Qty: 30 | Fill #2 | Status: AC

## 2020-11-23 ENCOUNTER — Other Ambulatory Visit: Payer: Self-pay

## 2020-12-26 ENCOUNTER — Other Ambulatory Visit: Payer: Self-pay

## 2020-12-26 MED FILL — Escitalopram Oxalate Tab 20 MG (Base Equiv): ORAL | 30 days supply | Qty: 30 | Fill #3 | Status: AC

## 2020-12-26 MED FILL — Atorvastatin Calcium Tab 20 MG (Base Equivalent): ORAL | 30 days supply | Qty: 30 | Fill #3 | Status: AC

## 2021-01-30 ENCOUNTER — Other Ambulatory Visit: Payer: Self-pay

## 2021-01-30 MED FILL — Escitalopram Oxalate Tab 20 MG (Base Equiv): ORAL | 30 days supply | Qty: 30 | Fill #4 | Status: AC

## 2021-01-30 MED FILL — Atorvastatin Calcium Tab 20 MG (Base Equivalent): ORAL | 30 days supply | Qty: 30 | Fill #4 | Status: AC

## 2021-02-01 ENCOUNTER — Other Ambulatory Visit: Payer: Self-pay

## 2021-02-22 ENCOUNTER — Other Ambulatory Visit: Payer: Self-pay

## 2021-02-22 ENCOUNTER — Encounter (INDEPENDENT_AMBULATORY_CARE_PROVIDER_SITE_OTHER): Payer: Self-pay | Admitting: Primary Care

## 2021-02-22 ENCOUNTER — Ambulatory Visit (INDEPENDENT_AMBULATORY_CARE_PROVIDER_SITE_OTHER): Payer: Self-pay | Admitting: Primary Care

## 2021-02-22 VITALS — BP 137/89 | HR 90 | Temp 97.9°F | Ht 63.0 in | Wt 161.2 lb

## 2021-02-22 DIAGNOSIS — Z760 Encounter for issue of repeat prescription: Secondary | ICD-10-CM

## 2021-02-22 DIAGNOSIS — F418 Other specified anxiety disorders: Secondary | ICD-10-CM

## 2021-02-22 DIAGNOSIS — R7303 Prediabetes: Secondary | ICD-10-CM

## 2021-02-22 DIAGNOSIS — F1010 Alcohol abuse, uncomplicated: Secondary | ICD-10-CM

## 2021-02-22 DIAGNOSIS — I10 Essential (primary) hypertension: Secondary | ICD-10-CM

## 2021-02-22 DIAGNOSIS — Z131 Encounter for screening for diabetes mellitus: Secondary | ICD-10-CM

## 2021-02-22 DIAGNOSIS — E782 Mixed hyperlipidemia: Secondary | ICD-10-CM

## 2021-02-22 DIAGNOSIS — Z1211 Encounter for screening for malignant neoplasm of colon: Secondary | ICD-10-CM

## 2021-02-22 LAB — POCT GLYCOSYLATED HEMOGLOBIN (HGB A1C): HbA1c, POC (prediabetic range): 5.7 % (ref 5.7–6.4)

## 2021-02-22 MED ORDER — ESCITALOPRAM OXALATE 20 MG PO TABS
ORAL_TABLET | Freq: Every day | ORAL | 1 refills | Status: DC
  Filled 2021-02-22: qty 90, fill #0
  Filled 2021-03-08: qty 30, 30d supply, fill #0
  Filled 2021-04-15: qty 30, 30d supply, fill #1
  Filled 2021-05-29: qty 30, 30d supply, fill #2
  Filled 2021-07-13: qty 30, 30d supply, fill #0

## 2021-02-22 MED ORDER — LISINOPRIL 5 MG PO TABS
5.0000 mg | ORAL_TABLET | Freq: Every day | ORAL | 3 refills | Status: DC
Start: 2021-02-22 — End: 2021-05-24
  Filled 2021-02-22: qty 30, 30d supply, fill #0

## 2021-02-22 MED ORDER — AMLODIPINE BESYLATE 10 MG PO TABS
ORAL_TABLET | Freq: Every day | ORAL | 3 refills | Status: DC
  Filled 2021-02-22 – 2021-03-08 (×2): qty 30, 30d supply, fill #0
  Filled 2021-04-15: qty 30, 30d supply, fill #1
  Filled 2021-07-13: qty 30, 30d supply, fill #0

## 2021-02-22 NOTE — Progress Notes (Signed)
[name]   [name] is a 60 y.o. male presents for hypertension evaluation and fasting labs for hyperlipidemia.  He denies shortness of breath, headaches, chest pain or lower extremity edema, sudden onset, vision changes, unilateral weakness, dizziness, paresthesias . He does admit to still drinking beer which has decrease to 2-3 a day. Does not drink until afternoon around 3 states stimulates his appetite.   Patient reports adherence with medications.  Dietary habits include: healthy- greens with all meals, uses pepper not salt Exercise habits include:yes - walk Family / Social history: No- Father was a alcoholic    Past Medical History:  Diagnosis Date  . 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 GERD (gastroesophageal reflux disease)   . History of blood transfusion   . HTN (hypertension)   . Ulcer    Past Surgical History:  Procedure Laterality Date  . CLOSED REDUCTION NASAL FRACTURE  11/19/2011   Procedure: CLOSED REDUCTION NASAL FRACTURE;  Surgeon: Jodi Marble, MD;  Location: [facility];  Service: ENT;  Laterality: N/A;  CLOSED REDUCTION NASAL AND NASAL SEPTAL FRACTURE WITH STABILIZATION  . No surgical     Allergies  Allergen Reactions  . Citrus   . Chlorthalidone     Hyponatremia   Current Outpatient Medications on File Prior to Visit  Medication Sig Dispense Refill  . amLODipine (NORVASC) 10 MG tablet TAKE 1 TABLET (10 MG TOTAL) BY MOUTH DAILY. 90 tablet 3  . aspirin 81 MG chewable tablet Chew 4 tablets (324 mg total) by mouth daily. (Patient taking differently: Chew 81 mg by mouth daily.) 30 tablet 11  . atorvastatin (LIPITOR) 20 MG tablet TAKE 1 TABLET (20 MG TOTAL) BY MOUTH DAILY. 90 tablet 1  . escitalopram (LEXAPRO) 20 MG tablet TAKE 1 TABLET (20 MG TOTAL) BY MOUTH DAILY. 90 tablet 1   No current facility-administered medications on file prior to visit.   Social History   Socioeconomic History  . Marital status: Married    Spouse name: Not  on file  . Number of children: 3  . Years of education: Not on file  . Highest education level: Not on file  Occupational History  . Not on file  Tobacco Use  . Smoking status: Every Day    Packs/day: 0.50    Types: Cigars, Cigarettes  . Smokeless tobacco: Never  . Tobacco comments:    2 black-n-milds   Vaping Use  . Vaping Use: Never used  Substance and Sexual Activity  . Alcohol use: Yes    Comment: Two cans of beer dialy.   . Drug use: No  . Sexual activity: Not on file  Other Topics Concern  . 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 Stroke Mother        Died age 89  . Hypertension Mother   . Diabetes Mother   . Breast cancer Mother   . Leukemia Father  Died age 89     OBJECTIVE:  Vitals:   02/22/21 0912  BP: 137/89  Pulse: 90  Temp: 97.9 F (36.6 C)  TempSrc: Temporal  SpO2: 96%  Weight: 161 lb 3.2 oz (73.1 kg)  Height: '5\' 3"'  (1.6 m)    Physical Exam Vitals reviewed.  Constitutional:      Appearance: Normal appearance.  HENT:     Head: Normocephalic and atraumatic.     Right Ear: Tympanic membrane and external ear normal.     Left Ear: Tympanic membrane and external ear normal.     Nose: Nose normal.  Eyes:     Extraocular Movements: Extraocular movements intact.  Cardiovascular:     Rate and Rhythm: Normal rate and regular rhythm.  Pulmonary:     Effort: Pulmonary effort is normal.     Breath sounds: Normal breath sounds.  Abdominal:     General: Bowel sounds  are normal. There is distension.     Palpations: Abdomen is soft.  Musculoskeletal:        General: Normal range of motion.     Cervical back: Normal range of motion.  Skin:    General: Skin is warm and dry.  Neurological:     Mental Status: He is alert and oriented to person, place, and time.  Psychiatric:        Mood and Affect: Mood normal.        Behavior: Behavior normal.        Thought Content: Thought content normal.        Judgment: Judgment normal.    Review of Systems  All other systems reviewed and are negative.  Last 3 Office BP readings: BP Readings from Last 3 Encounters:  02/22/21 137/89  08/22/20 130/90  03/14/20 128/88    BMET    Component Value Date/Time   NA 140 12/16/2019 1106   K 4.0 12/16/2019 1106   CL 105 12/16/2019 1106   CO2 21 12/16/2019 1106   GLUCOSE 100 (H) 12/16/2019 1106   GLUCOSE 124 (H) 12/02/2019 0357   BUN 6 (L) 12/16/2019 1106   CREATININE 0.91 12/16/2019 1106   CALCIUM 8.4 (L) 12/16/2019 1106   GFRNONAA 91 12/16/2019 1106   GFRAA 105 12/16/2019 1106    Renal function: CrCl cannot be calculated (Patient's most recent lab result is older than the maximum 21 days allowed.).  Clinical ASCVD: Yes  The 10-year ASCVD risk score (Arnett DK, et al., 2019) is: 40.6%   Values used to calculate the score:     Age: 60 years     Sex: Male     Is Non-Hispanic African American: Yes     Diabetic: Yes     Tobacco smoker: Yes     Systolic Blood Pressure: 309 mmHg     Is BP treated: Yes     HDL Cholesterol: 56 mg/dL     Total Cholesterol: 152 mg/dL  ASCVD risk factors include- [place]   ASSESSMENT & PLAN: Ardell was seen today for hypertension and hyperlipidemia.  Diagnoses and all orders for this visit: 1. Screening for diabetes mellitus   -     HgB A1c 5.7 Prediabetes No medication at this time but beer does have empty calories and will monitor foods that are high in carbohydrates are the following rice, potatoes, breads, sugars, and  pastas.  Reduction in the intake (eating) will assist in lowering your blood sugars.   Essential hypertension -Counseled on lifestyle modifications for blood pressure control including  reduced dietary sodium, increased exercise, weight reduction and adequate sleep. Also, educated patient about the risk for cardiovascular events, stroke and heart attack. Also counseled patient about the importance of medication adherence. If you participate in smoking, it is important to stop using tobacco as this will increase the risks associated with uncontrolled blood pressure.   -Hypertension longstanding diagnosed currently amlodipine 38m  on current medications. Patient is adherent with current medications. Added lisinopril for BP and renal protection   Goal BP:  For patients younger than 60: Goal BP < 130/80. For patients 60 and older: Goal BP < 140/90. For patients with diabetes: Goal BP < 130/80. Your most recent BP: 137/89  Minimize salt intake. Minimize alcohol intake -     CMP14+EGFR  Mixed hyperlipidemia  Healthy lifestyle diet of fruits vegetables fish nuts whole grains and low saturated fat . Foods high in cholesterol or liver, fatty meats,cheese, butter avocados, nuts and seeds, chocolate and fried foods. -     Lipid panel  Alcohol abuse Only beer not interested in stopping . Already stopped "hard stuff"  Anxiety with depression He can tell improvement with medication continue current does. -     escitalopram (LEXAPRO) 20 MG tablet; TAKE 1 TABLET (20 MG TOTAL) BY MOUTH DAILY.  Medication refill -     amLODipine (NORVASC) 10 MG tablet; TAKE 1 TABLET (10 MG TOTAL) BY MOUTH DAILY. -     escitalopram (LEXAPRO) 20 MG tablet; TAKE 1 TABLET (20 MG TOTAL) BY MOUTH DAILY.  Colon cancer screening FOBT  Other orders -     lisinopril (ZESTRIL) 5 MG tablet; Take 1 tablet (5 mg total) by mouth daily.    This note has been created with DAutomotive engineer Any transcriptional errors are unintentional.   MKerin Perna NP 02/22/2021, 9:25 AM

## 2021-02-22 NOTE — Patient Instructions (Signed)
Prediabetes Eating Plan °Prediabetes is a condition that causes blood sugar (glucose) levels to be higher than normal. This increases the risk for developing type 2 diabetes (type 2 diabetes mellitus). Working with a health care provider or nutrition specialist (dietitian) to make diet and lifestyle changes can help prevent the onset of diabetes. These changes may help you: °Control your blood glucose levels. °Improve your cholesterol levels. °Manage your blood pressure. °What are tips for following this plan? °Reading food labels °Read food labels to check the amount of fat, salt (sodium), and sugar in prepackaged foods. Avoid foods that have: °Saturated fats. °Trans fats. °Added sugars. °Avoid foods that have more than 300 milligrams (mg) of sodium per serving. Limit your sodium intake to less than 2,300 mg each day. °Shopping °Avoid buying pre-made and processed foods. °Avoid buying drinks with added sugar. °Cooking °Cook with olive oil. Do not use butter, lard, or ghee. °Bake, broil, grill, steam, or boil foods. Avoid frying. °Meal planning ° °Work with your dietitian to create an eating plan that is right for you. This may include tracking how many calories you take in each day. Use a food diary, notebook, or mobile application to track what you eat at each meal. °Consider following a Mediterranean diet. This includes: °Eating several servings of fresh fruits and vegetables each day. °Eating fish at least twice a week. °Eating one serving each day of whole grains, beans, nuts, and seeds. °Using olive oil instead of other fats. °Limiting alcohol. °Limiting red meat. °Using nonfat or low-fat dairy products. °Consider following a plant-based diet. This includes dietary choices that focus on eating mostly vegetables and fruit, grains, beans, nuts, and seeds. °If you have high blood pressure, you may need to limit your sodium intake or follow a diet such as the DASH (Dietary Approaches to Stop Hypertension) eating  plan. The DASH diet aims to lower high blood pressure. °Lifestyle °Set weight loss goals with help from your health care team. It is recommended that most people with prediabetes lose 7% of their body weight. °Exercise for at least 30 minutes 5 or more days a week. °Attend a support group or seek support from a mental health counselor. °Take over-the-counter and prescription medicines only as told by your health care provider. °What foods are recommended? °Fruits °Berries. Bananas. Apples. Oranges. Grapes. Papaya. Mango. Pomegranate. Kiwi. Grapefruit. Cherries. °Vegetables °Lettuce. Spinach. Peas. Beets. Cauliflower. Cabbage. Broccoli. Carrots. Tomatoes. Squash. Eggplant. Herbs. Peppers. Onions. Cucumbers. Brussels sprouts. °Grains °Whole grains, such as whole-wheat or whole-grain breads, crackers, cereals, and pasta. Unsweetened oatmeal. Bulgur. Barley. Quinoa. Brown rice. Corn or whole-wheat flour tortillas or taco shells. °Meats and other proteins °Seafood. Poultry without skin. Lean cuts of pork and beef. Tofu. Eggs. Nuts. Beans. °Dairy °Low-fat or fat-free dairy products, such as yogurt, cottage cheese, and cheese. °Beverages °Water. Tea. Coffee. Sugar-free or diet soda. Seltzer water. Low-fat or nonfat milk. Milk alternatives, such as soy or almond milk. °Fats and oils °Olive oil. Canola oil. Sunflower oil. Grapeseed oil. Avocado. Walnuts. °Sweets and desserts °Sugar-free or low-fat pudding. Sugar-free or low-fat ice cream and other frozen treats. °Seasonings and condiments °Herbs. Sodium-free spices. Mustard. Relish. Low-salt, low-sugar ketchup. Low-salt, low-sugar barbecue sauce. Low-fat or fat-free mayonnaise. °The items listed above may not be a complete list of recommended foods and beverages. Contact a dietitian for more information. °What foods are not recommended? °Fruits °Fruits canned with syrup. °Vegetables °Canned vegetables. Frozen vegetables with butter or cream sauce. °Grains °Refined white  flour and flour   products, such as bread, pasta, snack foods, and cereals. °Meats and other proteins °Fatty cuts of meat. Poultry with skin. Breaded or fried meat. Processed meats. °Dairy °Full-fat yogurt, cheese, or milk. °Beverages °Sweetened drinks, such as iced tea and soda. °Fats and oils °Butter. Lard. Ghee. °Sweets and desserts °Baked goods, such as cake, cupcakes, pastries, cookies, and cheesecake. °Seasonings and condiments °Spice mixes with added salt. Ketchup. Barbecue sauce. Mayonnaise. °The items listed above may not be a complete list of foods and beverages that are not recommended. Contact a dietitian for more information. °Where to find more information °American Diabetes Association: www.diabetes.org °Summary °You may need to make diet and lifestyle changes to help prevent the onset of diabetes. These changes can help you control blood sugar, improve cholesterol levels, and manage blood pressure. °Set weight loss goals with help from your health care team. It is recommended that most people with prediabetes lose 7% of their body weight. °Consider following a Mediterranean diet. This includes eating plenty of fresh fruits and vegetables, whole grains, beans, nuts, seeds, fish, and low-fat dairy, and using olive oil instead of other fats. °This information is not intended to replace advice given to you by your health care provider. Make sure you discuss any questions you have with your health care provider. °Document Revised: 08/26/2019 Document Reviewed: 08/26/2019 °Elsevier Patient Education © 2022 Elsevier Inc. ° °

## 2021-02-23 LAB — CMP14+EGFR
ALT: 20 IU/L (ref 0–44)
AST: 19 IU/L (ref 0–40)
Albumin/Globulin Ratio: 1.7 (ref 1.2–2.2)
Albumin: 4.6 g/dL (ref 3.8–4.8)
Alkaline Phosphatase: 115 IU/L (ref 44–121)
BUN/Creatinine Ratio: 8 — ABNORMAL LOW (ref 10–24)
BUN: 7 mg/dL — ABNORMAL LOW (ref 8–27)
Bilirubin Total: 0.2 mg/dL (ref 0.0–1.2)
CO2: 24 mmol/L (ref 20–29)
Calcium: 9.2 mg/dL (ref 8.6–10.2)
Chloride: 96 mmol/L (ref 96–106)
Creatinine, Ser: 0.93 mg/dL (ref 0.76–1.27)
Globulin, Total: 2.7 g/dL (ref 1.5–4.5)
Glucose: 113 mg/dL — ABNORMAL HIGH (ref 65–99)
Potassium: 4.2 mmol/L (ref 3.5–5.2)
Sodium: 134 mmol/L (ref 134–144)
Total Protein: 7.3 g/dL (ref 6.0–8.5)
eGFR: 93 mL/min/{1.73_m2} (ref >59)

## 2021-02-23 LAB — LIPID PANEL
Chol/HDL Ratio: 2.4 ratio (ref 0.0–5.0)
Cholesterol, Total: 134 mg/dL (ref 100–199)
HDL: 56 mg/dL (ref >39)
LDL Chol Calc (NIH): 66 mg/dL (ref 0–99)
Triglycerides: 56 mg/dL (ref 0–149)
VLDL Cholesterol Cal: 12 mg/dL (ref 5–40)

## 2021-02-27 ENCOUNTER — Telehealth (INDEPENDENT_AMBULATORY_CARE_PROVIDER_SITE_OTHER): Payer: Self-pay

## 2021-02-27 NOTE — Telephone Encounter (Signed)
Per DPR left message notifying patient of normal labs. Jasime Westergren S Eleonor Ocon, CMA  

## 2021-03-01 ENCOUNTER — Other Ambulatory Visit: Payer: Self-pay

## 2021-03-08 ENCOUNTER — Other Ambulatory Visit: Payer: Self-pay

## 2021-03-08 MED FILL — Atorvastatin Calcium Tab 20 MG (Base Equivalent): ORAL | 30 days supply | Qty: 30 | Fill #5 | Status: AC

## 2021-03-12 ENCOUNTER — Other Ambulatory Visit: Payer: Self-pay

## 2021-04-15 ENCOUNTER — Other Ambulatory Visit (INDEPENDENT_AMBULATORY_CARE_PROVIDER_SITE_OTHER): Payer: Self-pay | Admitting: Primary Care

## 2021-04-15 DIAGNOSIS — E782 Mixed hyperlipidemia: Secondary | ICD-10-CM

## 2021-04-15 DIAGNOSIS — Z760 Encounter for issue of repeat prescription: Secondary | ICD-10-CM

## 2021-04-16 ENCOUNTER — Other Ambulatory Visit: Payer: Self-pay

## 2021-04-16 MED ORDER — ATORVASTATIN CALCIUM 20 MG PO TABS
ORAL_TABLET | Freq: Every day | ORAL | 1 refills | Status: DC
  Filled 2021-04-16: qty 30, 30d supply, fill #0
  Filled 2021-05-29: qty 30, 30d supply, fill #1
  Filled 2021-07-13: qty 30, 30d supply, fill #0

## 2021-04-16 NOTE — Telephone Encounter (Signed)
Requested Prescriptions  Pending Prescriptions Disp Refills  . atorvastatin (LIPITOR) 20 MG tablet 90 tablet 1    Sig: TAKE 1 TABLET (20 MG TOTAL) BY MOUTH DAILY.     Cardiovascular:  Antilipid - Statins Passed - 04/15/2021  6:57 PM      Passed - Total Cholesterol in normal range and within 360 days    Cholesterol, Total  Date Value Ref Range Status  02/22/2021 134 100 - 199 mg/dL Final         Passed - LDL in normal range and within 360 days    LDL Chol Calc (NIH)  Date Value Ref Range Status  02/22/2021 66 0 - 99 mg/dL Final         Passed - HDL in normal range and within 360 days    HDL  Date Value Ref Range Status  02/22/2021 56 >39 mg/dL Final         Passed - Triglycerides in normal range and within 360 days    Triglycerides  Date Value Ref Range Status  02/22/2021 56 0 - 149 mg/dL Final         Passed - Patient is not pregnant      Passed - Valid encounter within last 12 months    Recent Outpatient Visits          1 month ago Screening for diabetes mellitus   [facility] Grayce Sessions, NP   7 months ago Alcohol abuse   [facility] Gwinda Passe P, NP   1 year ago Type 2 diabetes mellitus without complication, without long-term current use of insulin (HCC)   [facility] Grayce Sessions, NP   1 year ago Essential hypertension   [facility] Grayce Sessions, NP   1 year ago Essential hypertension   [facility] Grayce Sessions, NP      Future Appointments            In 1 month Randa Evens, Kinnie Scales, NP [facility]

## 2021-04-20 ENCOUNTER — Other Ambulatory Visit: Payer: Self-pay

## 2021-05-24 ENCOUNTER — Other Ambulatory Visit: Payer: Self-pay

## 2021-05-24 ENCOUNTER — Encounter (INDEPENDENT_AMBULATORY_CARE_PROVIDER_SITE_OTHER): Payer: Self-pay | Admitting: Primary Care

## 2021-05-24 ENCOUNTER — Ambulatory Visit (INDEPENDENT_AMBULATORY_CARE_PROVIDER_SITE_OTHER): Payer: Self-pay | Admitting: Primary Care

## 2021-05-24 VITALS — BP 138/95 | HR 86 | Temp 97.9°F | Ht 63.0 in | Wt 159.6 lb

## 2021-05-24 DIAGNOSIS — I10 Essential (primary) hypertension: Secondary | ICD-10-CM

## 2021-05-24 DIAGNOSIS — Z1211 Encounter for screening for malignant neoplasm of colon: Secondary | ICD-10-CM

## 2021-05-24 MED ORDER — LISINOPRIL 10 MG PO TABS
10.0000 mg | ORAL_TABLET | Freq: Every day | ORAL | 0 refills | Status: DC
  Filled 2021-05-24 – 2021-07-13 (×3): qty 30, 30d supply, fill #0

## 2021-05-24 NOTE — Progress Notes (Signed)
NOSE BLEEDS IN LAST 3 WEEKS???? ALSO LEGS BE GETTING WEAK!!!!!

## 2021-05-24 NOTE — Patient Instructions (Signed)
Take 2 (5mg ) Lisinopril until gone. Then pick up your Lisinopril 10 mg take one (1) daily. Alcohol is a blood thinner- can cause bleeding and bruising.

## 2021-05-24 NOTE — Progress Notes (Signed)
[facility]   Casey Warren is a 60 y.o. over weight male presents for hypertension evaluation, Denies shortness of breath, headaches, chest pain or lower extremity edema, sudden onset, vision changes, unilateral weakness, dizziness, paresthesias   Patient reports adherence with medications.  Dietary habits include: monitoring sodium Exercise habits include:yes /walking  Family / Social history: mother HTN/Stroke   Past Medical History:  Diagnosis Date   Anxiety    related to surgery   GERD (gastroesophageal reflux disease)    History of blood transfusion    HTN (hypertension)    Ulcer    Past Surgical History:  Procedure Laterality Date   CLOSED REDUCTION NASAL FRACTURE  11/19/2011   Procedure: CLOSED REDUCTION NASAL FRACTURE;  Surgeon: Flo Shanks, MD;  Location: [facility] OR;  Service: ENT;  Laterality: N/A;  CLOSED REDUCTION NASAL AND NASAL SEPTAL FRACTURE WITH STABILIZATION   No surgical     Allergies  Allergen Reactions   Citrus    Chlorthalidone     Hyponatremia   Current Outpatient Medications on File Prior to Visit  Medication Sig Dispense Refill   amLODipine (NORVASC) 10 MG tablet TAKE 1 TABLET (10 MG TOTAL) BY MOUTH DAILY. 90 tablet 3   aspirin 81 MG chewable tablet Chew 4 tablets (324 mg total) by mouth daily. (Patient taking differently: Chew 81 mg by mouth daily.) 30 tablet 11   atorvastatin (LIPITOR) 20 MG tablet TAKE 1 TABLET (20 MG TOTAL) BY MOUTH DAILY. 90 tablet 1   escitalopram (LEXAPRO) 20 MG tablet TAKE 1 TABLET (20 MG TOTAL) BY MOUTH DAILY. 90 tablet 1   lisinopril (ZESTRIL) 5 MG tablet Take 1 tablet (5 mg total) by mouth daily. 90 tablet 3   No current facility-administered medications on file prior to visit.   Social History   Socioeconomic History   Marital status: Married    Spouse name: Not on file   Number of children: 3   Years of education: Not on file   Highest education level: Not on file  Occupational History   Not  on file  Tobacco Use   Smoking status: Every Day    Packs/day: 0.50    Types: Cigars, Cigarettes   Smokeless tobacco: Never   Tobacco comments:    2 black-n-milds   Vaping Use   Vaping Use: Never used  Substance and Sexual Activity   Alcohol use: Yes    Comment: Two cans of beer dialy.    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 Strain: Not on file  Food Insecurity: Not on file  Transportation Needs: Not on file  Physical Activity: Not on file  Stress: Not on file  Social Connections: Not on file  Intimate Partner Violence: Not on file   Family History  Problem Relation Age of Onset   Stroke Mother        Died age 58   Hypertension Mother    Diabetes Mother    Breast cancer Mother    Leukemia Father        Died age 63     OBJECTIVE:  Vitals:   05/24/21 1043  BP: (!) 138/95  Pulse: 86  Temp: 97.9 F (36.6 C)  TempSrc: Temporal  SpO2: 96%  Weight: 159 lb 9.6 oz (72.4 kg)  Height: 5\' 3"  (1.6 m)   Physical exam: General: Vital signs reviewed.  Patient is well-developed and well-nourished, over weight in no acute distress and cooperative with exam. Head: Normocephalic and atraumatic. Eyes: EOMI, conjunctivae normal, no scleral icterus. Neck: Supple, trachea midline, normal ROM, no JVD, masses, thyromegaly, or carotid bruit present. Cardiovascular: RRR, S1 normal, S2 normal, no murmurs, gallops, or rubs. Pulmonary/Chest: Clear to auscultation bilaterally, no wheezes, rales, or rhonchi. Abdominal: Soft, non-tender, non-distended, BS +, no masses, organomegaly, or guarding present. Musculoskeletal: No joint deformities, erythema, or stiffness, ROM full and nontender. Extremities: No  lower extremity edema bilaterally,  pulses symmetric and intact bilaterally. No cyanosis or clubbing. Neurological: A&O x3, Strength is normal Skin: Warm, dry and intact. No rashes or erythema. Psychiatric: Normal mood and affect. speech and behavior is normal. Cognition and memory are normal.    ROS Pertinent positive and negative noted in HPI  Last 3 Office BP readings: BP Readings from Last 3 Encounters:  05/24/21 (!) 138/95  02/22/21 137/89  08/22/20 130/90    BMET    Component Value Date/Time   NA 134 02/22/2021 1009   K 4.2 02/22/2021 1009   CL 96 02/22/2021 1009   CO2 24 02/22/2021 1009   GLUCOSE 113 (H) 02/22/2021 1009   GLUCOSE 124 (H) 12/02/2019 0357   BUN 7 (L) 02/22/2021 1009   CREATININE 0.93 02/22/2021 1009   CALCIUM 9.2 02/22/2021 1009   GFRNONAA 91 12/16/2019 1106   GFRAA 105 12/16/2019 1106    Renal function: CrCl cannot be calculated (Patient's most recent lab result is older than the maximum 21 days allowed.).  Clinical ASCVD: Yes  The 10-year ASCVD risk score (Arnett DK, et al., 2019) is: 39.9%   Values used to calculate the score:     Age: 37 years     Sex: Male     Is Non-Hispanic African American: Yes     Diabetic: Yes     Tobacco smoker: Yes     Systolic Blood Pressure: 138 mmHg     Is BP treated: Yes     HDL Cholesterol: 56 mg/dL     Total Cholesterol: 134 mg/dL  ASCVD risk factors include- 68   ASSESSMENT & PLAN:  Casey Warren was seen today for hypertension and alcohol problem.  Diagnoses and all orders for this visit:  Essential hypertension -Counseled on lifestyle modifications for blood pressure control including reduced dietary sodium, increased exercise, weight reduction and adequate sleep. Also, educated patient about the risk for cardiovascular events, stroke and heart attack. Also counseled patient about the importance of medication adherence. If you participate in smoking, it is important to stop using tobacco as this will  increase the risks associated with uncontrolled blood pressure.   -Hypertension longstanding diagnosed currently lisinopril 5mg  and amolpine 10mg  daily on current medications. Patient is adherent with current medications.   Goal BP:  For patients younger than 60: Goal BP < 130/80. For patients 60 and older: Goal BP < 140/90. For patients with diabetes: Goal BP < 130/80. Your most recent BP: 138/95  Minimize salt intake. Minimize alcohol intake  Increase from lisinopril 5mg  to 10mg  -     lisinopril (ZESTRIL) 10 MG tablet; Take 1 tablet (10 mg total) by mouth daily.  Colon cancer screening -     Fecal occult blood, imunochemical     This  note has been created with Education officer, environmental. Any transcriptional errors are unintentional.   Grayce Sessions, NP 05/24/2021, 10:54 AM

## 2021-05-26 LAB — FECAL OCCULT BLOOD, IMMUNOCHEMICAL: Fecal Occult Bld: NEGATIVE

## 2021-05-28 ENCOUNTER — Telehealth (INDEPENDENT_AMBULATORY_CARE_PROVIDER_SITE_OTHER): Payer: Self-pay

## 2021-05-28 NOTE — Telephone Encounter (Signed)
-----   Message from Grayce Sessions, NP sent at 05/28/2021  9:02 AM EST ----- Fecal occult negative. Recheck next year

## 2021-05-28 NOTE — Telephone Encounter (Signed)
Patient is aware of negative fecal occult(colon cancer screening). Repeat next year. He verbalized understanding. Maryjean Morn, CMA

## 2021-05-29 ENCOUNTER — Other Ambulatory Visit: Payer: Self-pay

## 2021-05-31 ENCOUNTER — Other Ambulatory Visit: Payer: Self-pay

## 2021-06-01 ENCOUNTER — Other Ambulatory Visit: Payer: Self-pay

## 2021-06-06 ENCOUNTER — Other Ambulatory Visit: Payer: Self-pay

## 2021-07-13 ENCOUNTER — Other Ambulatory Visit: Payer: Self-pay

## 2021-07-18 ENCOUNTER — Other Ambulatory Visit: Payer: Self-pay

## 2021-08-22 ENCOUNTER — Ambulatory Visit (INDEPENDENT_AMBULATORY_CARE_PROVIDER_SITE_OTHER): Payer: Self-pay | Admitting: Primary Care

## 2021-08-22 ENCOUNTER — Other Ambulatory Visit: Payer: Self-pay

## 2021-08-22 ENCOUNTER — Encounter (INDEPENDENT_AMBULATORY_CARE_PROVIDER_SITE_OTHER): Payer: Self-pay | Admitting: Primary Care

## 2021-08-22 VITALS — BP 135/87 | HR 96 | Temp 97.9°F | Ht 63.0 in | Wt 159.2 lb

## 2021-08-22 DIAGNOSIS — I10 Essential (primary) hypertension: Secondary | ICD-10-CM

## 2021-08-22 DIAGNOSIS — F418 Other specified anxiety disorders: Secondary | ICD-10-CM

## 2021-08-22 DIAGNOSIS — E782 Mixed hyperlipidemia: Secondary | ICD-10-CM

## 2021-08-22 DIAGNOSIS — R7303 Prediabetes: Secondary | ICD-10-CM

## 2021-08-22 DIAGNOSIS — Z760 Encounter for issue of repeat prescription: Secondary | ICD-10-CM

## 2021-08-22 LAB — POCT GLYCOSYLATED HEMOGLOBIN (HGB A1C): Hemoglobin A1C: 5.7 % — AB (ref 4.0–5.6)

## 2021-08-22 MED ORDER — LISINOPRIL 10 MG PO TABS
10.0000 mg | ORAL_TABLET | Freq: Every day | ORAL | 1 refills | Status: DC
  Filled 2021-08-22 – 2021-09-05 (×2): qty 90, 90d supply, fill #0
  Filled 2022-01-14: qty 90, 90d supply, fill #1

## 2021-08-22 MED ORDER — ESCITALOPRAM OXALATE 20 MG PO TABS
ORAL_TABLET | Freq: Every day | ORAL | 1 refills | Status: DC
  Filled 2021-08-22 – 2021-09-05 (×2): qty 90, 90d supply, fill #0
  Filled 2022-01-14: qty 90, 90d supply, fill #1

## 2021-08-22 MED ORDER — ATORVASTATIN CALCIUM 20 MG PO TABS
ORAL_TABLET | Freq: Every day | ORAL | 1 refills | Status: DC
Start: 2021-08-22 — End: 2022-07-19
  Filled 2021-08-22 – 2021-09-05 (×2): qty 90, 90d supply, fill #0
  Filled 2022-01-14: qty 90, 90d supply, fill #1

## 2021-08-22 MED ORDER — AMLODIPINE BESYLATE 10 MG PO TABS
ORAL_TABLET | Freq: Every day | ORAL | 3 refills | Status: DC
  Filled 2021-08-22 – 2021-09-05 (×2): qty 90, 90d supply, fill #0
  Filled 2022-01-14: qty 90, 90d supply, fill #1

## 2021-08-22 NOTE — Progress Notes (Signed)
?[facility] ? ? ?Casey Warren is a 61 y.o. male presents for hypertension evaluation, Denies shortness of breath, headaches, chest pain or lower extremity edema, sudden onset, vision changes, unilateral weakness, dizziness, paresthesias . He continues to still drink 2 16 oz cans of beer daily  ? ?Patient reports adherence with medications. ? ?Dietary habits include: DASH DIET- his wife  ?Exercise habits include:yes  ?Family / Social history: Mother -CVA ? ? ?Past Medical History:  ?Diagnosis Date  ? Anxiety   ? related to surgery  ? GERD (gastroesophageal reflux disease)   ? History of blood transfusion   ? HTN (hypertension)   ? Ulcer   ? ?Past Surgical History:  ?Procedure Laterality Date  ? CLOSED REDUCTION NASAL FRACTURE  11/19/2011  ? Procedure: CLOSED REDUCTION NASAL FRACTURE;  Surgeon: Flo Shanks, MD;  Location: [facility] OR;  Service: ENT;  Laterality: N/A;  CLOSED REDUCTION NASAL AND NASAL SEPTAL FRACTURE WITH STABILIZATION  ? No surgical    ? ?Allergies  ?Allergen Reactions  ? Citrus   ? Chlorthalidone   ?  Hyponatremia  ? ?Current Outpatient Medications on File Prior to Visit  ?Medication Sig Dispense Refill  ? amLODipine (NORVASC) 10 MG tablet TAKE 1 TABLET (10 MG TOTAL) BY MOUTH DAILY. 90 tablet 3  ? aspirin 81 MG chewable tablet Chew 4 tablets (324 mg total) by mouth daily. (Patient taking differently: Chew 81 mg by mouth daily.) 30 tablet 11  ? atorvastatin (LIPITOR) 20 MG tablet TAKE 1 TABLET (20 MG TOTAL) BY MOUTH DAILY. 90 tablet 1  ? escitalopram (LEXAPRO) 20 MG tablet TAKE 1 TABLET (20 MG TOTAL) BY MOUTH DAILY. 90 tablet 1  ? lisinopril (ZESTRIL) 10 MG tablet Take 1 tablet (10 mg total) by mouth daily. 90 tablet 0  ? ?No current facility-administered medications on file prior to visit.  ? ?Social History  ? ?Socioeconomic History  ? Marital status: Married  ?  Spouse name: Not on file  ? Number of children: 3  ? Years of education: Not on file  ? Highest education level: Not on  file  ?Occupational History  ? Not on file  ?Tobacco Use  ? Smoking status: Every Day  ?  Packs/day: 0.50  ?  Types: Cigars, Cigarettes  ? Smokeless tobacco: Never  ? Tobacco comments:  ?  2 black-n-milds   ?Vaping Use  ? Vaping Use: Never used  ?Substance and Sexual Activity  ? Alcohol use: Yes  ?  Comment: Two cans of beer dialy.   ? Drug use: No  ? Sexual activity: Not on file  ?Other Topics Concern  ? Not on file  ?Social History Narrative  ? Lives in 1 story home  ? Pt and wife live with their daughter, her husband and their 3 children  ? 12 grade education  ? Works as Tour manager  ? ?Social Determinants of Health  ? ?Financial Resource Strain: Not on file  ?Food Insecurity: Not on file  ?Transportation Needs: Not on file  ?Physical Activity: Not on file  ?Stress: Not on file  ?Social Connections: Not on file  ?Intimate Partner Violence: Not on file  ? ?Family History  ?Problem Relation Age of Onset  ? Stroke Mother   ?     Died age 37  ? Hypertension Mother   ? Diabetes Mother   ? Breast cancer Mother   ? Leukemia Father   ?     Died age 19  ? ? ? ?  OBJECTIVE: ? ?Vitals:  ? 08/22/21 1051  ?BP: 135/87  ?Pulse: 96  ?Temp: 97.9 ?F (36.6 ?C)  ?TempSrc: Oral  ?SpO2: 95%  ?Weight: 159 lb 3.2 oz (72.2 kg)  ?Height: 5\' 3"  (1.6 m)  ? ? ?Physical Exam ?Vitals reviewed.  ?Constitutional:   ?   Appearance: Normal appearance.  ?HENT:  ?   Head: Normocephalic.  ?   Right Ear: Tympanic membrane and external ear normal.  ?   Left Ear: Tympanic membrane and external ear normal.  ?   Nose: Nose normal.  ?Eyes:  ?   Extraocular Movements: Extraocular movements intact.  ?   Pupils: Pupils are equal, round, and reactive to light.  ?Cardiovascular:  ?   Rate and Rhythm: Normal rate and regular rhythm.  ?Pulmonary:  ?   Effort: Pulmonary effort is normal.  ?   Breath sounds: Normal breath sounds.  ?Abdominal:  ?   General: Bowel sounds are normal.  ?   Palpations: Abdomen is soft.  ?Musculoskeletal:     ?    General: Normal range of motion.  ?   Cervical back: Normal range of motion.  ?Skin: ?   General: Skin is warm and dry.  ?Neurological:  ?   Mental Status: He is alert and oriented to person, place, and time.  ?Psychiatric:     ?   Mood and Affect: Mood normal.     ?   Behavior: Behavior normal.     ?   Thought Content: Thought content normal.     ?   Judgment: Judgment normal.  ? ? ?ROS ?Comprehensive ROS Pertinent positive and negative noted in HPI   ?Last 3 Office BP readings: ?BP Readings from Last 3 Encounters:  ?08/22/21 135/87  ?05/24/21 (!) 138/95  ?02/22/21 137/89  ? ? ?BMET ?   ?Component Value Date/Time  ? NA 134 02/22/2021 1009  ? K 4.2 02/22/2021 1009  ? CL 96 02/22/2021 1009  ? CO2 24 02/22/2021 1009  ? GLUCOSE 113 (H) 02/22/2021 1009  ? GLUCOSE 124 (H) 12/02/2019 0357  ? BUN 7 (L) 02/22/2021 1009  ? CREATININE 0.93 02/22/2021 1009  ? CALCIUM 9.2 02/22/2021 1009  ? GFRNONAA 91 12/16/2019 1106  ? GFRAA 105 12/16/2019 1106  ? ? ?Renal function: ?CrCl cannot be calculated (Patient's most recent lab result is older than the maximum 21 days allowed.). ? ?Clinical ASCVD: Yes  ?The 10-year ASCVD risk score (Arnett DK, et al., 2019) is: 39.8% ?  Values used to calculate the score: ?    Age: [age] years ?    Sex: Male ?    Is Non-Hispanic African American: Yes ?    Diabetic: Yes ?    Tobacco smoker: Yes ?    Systolic Blood Pressure: 135 mmHg ?    Is BP treated: Yes ?    HDL Cholesterol: 56 mg/dL ?    Total Cholesterol: 134 mg/dL ? ?ASCVD risk factors include- [place] ? ? ?ASSESSMENT & PLAN: ?Deniece PortelaWayne was seen today for hypertension and alcohol problem. ? ?Diagnoses and all orders for this visit: ? ?Prediabetes ?-     HgB A1c 5.7 monitor carbs and exercise  ? ?Essential hypertension ?-Counseled on lifestyle modifications for blood pressure control including reduced dietary sodium, increased exercise, weight reduction and adequate sleep. Also, educated patient about the risk for cardiovascular events, stroke and heart  attack. Also counseled patient about the importance of medication adherence. If you participate in smoking, it is important to stop  using tobacco as this will increase the risks associated with uncontrolled blood pressure.  ? ?Goal BP:  ?For patients younger than 60: Goal BP < 130/80. ?For patients 60 and older: Goal BP < 140/90. ?For patients with diabetes: Goal BP < 130/80. ?Your most recent BP: 135/87 ? ?Minimize salt intake. ?Minimize alcohol intake ?Tu was seen today for hypertension and alcohol problem. ? ?Medication refill ?-     amLODipine (NORVASC) 10 MG tablet; TAKE 1 TABLET (10 MG TOTAL) BY MOUTH DAILY. ?-     escitalopram (LEXAPRO) 20 MG tablet; TAKE 1 TABLET (20 MG TOTAL) BY MOUTH DAILY. ?-     atorvastatin (LIPITOR) 20 MG tablet; TAKE 1 TABLET (20 MG TOTAL) BY MOUTH DAILY. ? ?Anxiety with depression ?-     escitalopram (LEXAPRO) 20 MG tablet; TAKE 1 TABLET (20 MG TOTAL) BY MOUTH DAILY. ? ?Mixed hyperlipidemia ?-     atorvastatin (LIPITOR) 20 MG tablet; TAKE 1 TABLET (20 MG TOTAL) BY MOUTH DAILY. ? ?  ? ? ?This note has been created with Education officer, environmental. Any transcriptional errors are unintentional.  ? ?Grayce Sessions, NP ?08/22/2021, 10:56 AM ?  ?

## 2021-08-29 ENCOUNTER — Other Ambulatory Visit: Payer: Self-pay

## 2021-09-05 ENCOUNTER — Other Ambulatory Visit: Payer: Self-pay

## 2022-01-14 ENCOUNTER — Other Ambulatory Visit: Payer: Self-pay

## 2022-01-16 ENCOUNTER — Other Ambulatory Visit: Payer: Self-pay

## 2022-02-22 ENCOUNTER — Other Ambulatory Visit: Payer: Self-pay

## 2022-02-22 ENCOUNTER — Encounter (INDEPENDENT_AMBULATORY_CARE_PROVIDER_SITE_OTHER): Payer: Self-pay | Admitting: Primary Care

## 2022-02-22 ENCOUNTER — Ambulatory Visit (INDEPENDENT_AMBULATORY_CARE_PROVIDER_SITE_OTHER): Payer: Self-pay | Admitting: Primary Care

## 2022-02-22 VITALS — BP 139/84 | HR 89 | Resp 16 | Wt 156.4 lb

## 2022-02-22 DIAGNOSIS — F418 Other specified anxiety disorders: Secondary | ICD-10-CM

## 2022-02-22 DIAGNOSIS — Z760 Encounter for issue of repeat prescription: Secondary | ICD-10-CM

## 2022-02-22 DIAGNOSIS — I10 Essential (primary) hypertension: Secondary | ICD-10-CM

## 2022-02-22 MED ORDER — LISINOPRIL 10 MG PO TABS
10.0000 mg | ORAL_TABLET | Freq: Every day | ORAL | 1 refills | Status: DC
  Filled 2022-02-22: qty 90, 90d supply, fill #0

## 2022-02-22 MED ORDER — ESCITALOPRAM OXALATE 20 MG PO TABS
ORAL_TABLET | Freq: Every day | ORAL | 1 refills | Status: DC
  Filled 2022-02-22: qty 90, fill #0
  Filled 2022-07-08: qty 90, 90d supply, fill #0

## 2022-02-22 MED ORDER — AMLODIPINE BESYLATE 10 MG PO TABS
10.0000 mg | ORAL_TABLET | Freq: Every day | ORAL | 3 refills | Status: DC
  Filled 2022-02-22: qty 90, fill #0
  Filled 2022-07-08: qty 90, 90d supply, fill #0

## 2022-02-22 NOTE — Progress Notes (Signed)
[facility]   Casey Warren is a 61 y.o. male presents for hypertension evaluation, Denies shortness of breath, headaches, chest pain or lower extremity edema, sudden onset, vision changes, unilateral weakness, dizziness, paresthesias   Patient reports adherence with medications.  Dietary habits include: trying to monitor bacon and sausages (pork) Exercise habits include:walking  Family / Social history: None   Past Medical History:  Diagnosis Date   Anxiety    related to surgery   GERD (gastroesophageal reflux disease)    History of blood transfusion    HTN (hypertension)    Ulcer    Past Surgical History:  Procedure Laterality Date   CLOSED REDUCTION NASAL FRACTURE  11/19/2011   Procedure: CLOSED REDUCTION NASAL FRACTURE;  Surgeon: Flo Shanks, MD;  Location: [facility] OR;  Service: ENT;  Laterality: N/A;  CLOSED REDUCTION NASAL AND NASAL SEPTAL FRACTURE WITH STABILIZATION   No surgical     Allergies  Allergen Reactions   Citrus    Chlorthalidone     Hyponatremia   Current Outpatient Medications on File Prior to Visit  Medication Sig Dispense Refill   amLODipine (NORVASC) 10 MG tablet TAKE 1 TABLET (10 MG TOTAL) BY MOUTH DAILY. 90 tablet 3   aspirin 81 MG chewable tablet Chew 4 tablets (324 mg total) by mouth daily. (Patient taking differently: Chew 81 mg by mouth daily.) 30 tablet 11   atorvastatin (LIPITOR) 20 MG tablet TAKE 1 TABLET (20 MG TOTAL) BY MOUTH DAILY. 90 tablet 1   escitalopram (LEXAPRO) 20 MG tablet TAKE 1 TABLET (20 MG TOTAL) BY MOUTH DAILY. 90 tablet 1   lisinopril (ZESTRIL) 10 MG tablet Take 1 tablet (10 mg total) by mouth daily. 90 tablet 1   No current facility-administered medications on file prior to visit.   Social History   Socioeconomic History   Marital status: Married    Spouse name: Not on file   Number of children: 3   Years of education: Not on file   Highest education level: Not on file  Occupational History   Not on  file  Tobacco Use   Smoking status: Every Day    Packs/day: 0.50    Types: Cigars, Cigarettes   Smokeless tobacco: Never   Tobacco comments:    2 black-n-milds   Vaping Use   Vaping Use: Never used  Substance and Sexual Activity   Alcohol use: Yes    Comment: Two cans of beer dialy.    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  Constitutional:      Appearance: Normal appearance.  HENT:     Head: Normocephalic.     Right Ear: Tympanic membrane and external ear normal.     Left Ear: Tympanic membrane and external ear normal.     Nose: Nose normal.  Eyes:     Extraocular Movements: Extraocular movements intact.     Pupils: Pupils are equal, round, and reactive to light.  Cardiovascular:     Rate and Rhythm: Normal rate and regular rhythm.  Pulmonary:     Effort: Pulmonary effort is normal.     Breath sounds: Normal breath sounds.  Abdominal:     General: Abdomen is flat. Bowel sounds are normal.     Palpations: Abdomen is soft.  Musculoskeletal:        General: Normal range of motion.     Cervical back: Normal range of motion.   Skin:    General: Skin is warm and dry.  Neurological:     Mental Status: He is alert and oriented to person, place, and time.  Psychiatric:        Mood and Affect: Mood normal.        Behavior: Behavior normal.        Thought Content: Thought content normal.    ROS  Last 3 Office BP readings: BP Readings from Last 3 Encounters:  02/22/22 139/84  08/22/21 135/87  05/24/21 (!) 138/95    BMET    Component Value Date/Time   NA 134 02/22/2021 1009   K 4.2 02/22/2021 1009   CL 96 02/22/2021 1009   CO2 24 02/22/2021 1009   GLUCOSE 113 (H) 02/22/2021 1009   GLUCOSE 124 (H) 12/02/2019 0357   BUN 7 (L) 02/22/2021 1009   CREATININE 0.93 02/22/2021 1009   CALCIUM 9.2 02/22/2021 1009   GFRNONAA 91 12/16/2019 1106   GFRAA 105 12/16/2019 1106    Renal function: CrCl cannot be calculated (Patient's most recent lab result is older than the maximum 21 days allowed.).  Clinical ASCVD: Yes  The 10-year ASCVD risk score (Arnett DK, et al., 2019) is: 41.6%   Values used to calculate the score:     Age: 72 years     Sex: Male     Is Non-Hispanic African American: Yes     Diabetic: Yes     Tobacco smoker: Yes     Systolic Blood Pressure: 139 mmHg     Is BP treated: Yes     HDL Cholesterol: 56 mg/dL     Total Cholesterol: 134 mg/dL  ASCVD risk factors include- [place]   ASSESSMENT & PLAN:  Casey Warren was seen today for hypertension.  Diagnoses and all orders for this visit:  Essential hypertension -Counseled on lifestyle modifications for blood pressure control including reduced dietary sodium, increased exercise, weight reduction and adequate sleep. Also, educated patient about the risk for cardiovascular events, stroke and heart attack. Also counseled patient about the importance of medication adherence. If you participate in smoking, it is important to stop using tobacco as this will increase the risks associated with uncontrolled blood pressure.   Goal BP:  For patients younger  than 60: Goal BP < 130/80. For patients 60 and older: Goal BP < 140/90. For patients with diabetes: Goal BP < 130/80. Your most recent BP: 139/90  Minimize salt intake. Minimize alcohol intake associated with  Medication refill -     lisinopril (ZESTRIL) 10 MG tablet; Take 1 tablet (10 mg total) by mouth daily. -     escitalopram (LEXAPRO) 20 MG tablet; TAKE 1 TABLET (20 MG TOTAL) BY MOUTH DAILY. -     amLODipine (NORVASC) 10 MG tablet; TAKE 1 TABLET (10 MG TOTAL) BY MOUTH DAILY.  Anxiety with depression States working fine no changes needed -     escitalopram (LEXAPRO) 20 MG tablet; TAKE 1 TABLET (20 MG TOTAL) BY MOUTH DAILY.      This note has been created with Education officer, environmental. Any transcriptional errors are unintentional.   Grayce Sessions, NP 02/22/2022, 8:43 AM

## 2022-07-01 ENCOUNTER — Other Ambulatory Visit: Payer: Self-pay

## 2022-07-01 ENCOUNTER — Ambulatory Visit (INDEPENDENT_AMBULATORY_CARE_PROVIDER_SITE_OTHER): Payer: Self-pay

## 2022-07-01 ENCOUNTER — Observation Stay (HOSPITAL_COMMUNITY)
Admission: EM | Admit: 2022-07-01 | Discharge: 2022-07-02 | Disposition: A | Discharge status: Home / Self Care | Payer: BLUE CROSS/BLUE SHIELD | Attending: Internal Medicine | Admitting: Internal Medicine

## 2022-07-01 ENCOUNTER — Emergency Department (HOSPITAL_COMMUNITY): Payer: BLUE CROSS/BLUE SHIELD

## 2022-07-01 DIAGNOSIS — E785 Hyperlipidemia, unspecified: Secondary | ICD-10-CM

## 2022-07-01 DIAGNOSIS — T783XXA Angioneurotic edema, initial encounter: Principal | ICD-10-CM | POA: Insufficient documentation

## 2022-07-01 DIAGNOSIS — F17290 Nicotine dependence, other tobacco product, uncomplicated: Secondary | ICD-10-CM | POA: Diagnosis not present

## 2022-07-01 DIAGNOSIS — Z7982 Long term (current) use of aspirin: Secondary | ICD-10-CM | POA: Diagnosis not present

## 2022-07-01 DIAGNOSIS — Z79899 Other long term (current) drug therapy: Secondary | ICD-10-CM | POA: Insufficient documentation

## 2022-07-01 DIAGNOSIS — I10 Essential (primary) hypertension: Secondary | ICD-10-CM | POA: Insufficient documentation

## 2022-07-01 DIAGNOSIS — D649 Anemia, unspecified: Secondary | ICD-10-CM | POA: Diagnosis not present

## 2022-07-01 DIAGNOSIS — F419 Anxiety disorder, unspecified: Secondary | ICD-10-CM | POA: Insufficient documentation

## 2022-07-01 LAB — BASIC METABOLIC PANEL
Anion gap: 11 (ref 5–15)
BUN: 5 mg/dL — ABNORMAL LOW (ref 8–23)
CO2: 23 mmol/L (ref 22–32)
Calcium: 8.9 mg/dL (ref 8.9–10.3)
Chloride: 98 mmol/L (ref 98–111)
Creatinine, Ser: 1.01 mg/dL (ref 0.61–1.24)
GFR, Estimated: >60 mL/min (ref >60)
Glucose, Bld: 111 mg/dL — ABNORMAL HIGH (ref 70–99)
Potassium: 4 mmol/L (ref 3.5–5.1)
Sodium: 132 mmol/L — ABNORMAL LOW (ref 135–145)

## 2022-07-01 LAB — CBC WITH DIFFERENTIAL/PLATELET
Abs Immature Granulocytes: 0.01 10*3/uL (ref 0.00–0.07)
Basophils Absolute: 0 10*3/uL (ref 0.0–0.1)
Basophils Relative: 1 %
Eosinophils Absolute: 0.1 10*3/uL (ref 0.0–0.5)
Eosinophils Relative: 1 %
HCT: 45.1 % (ref 39.0–52.0)
Hemoglobin: 16.2 g/dL (ref 13.0–17.0)
Immature Granulocytes: 0 %
Lymphocytes Relative: 38 %
Lymphs Abs: 2.2 10*3/uL (ref 0.7–4.0)
MCH: 32.2 pg (ref 26.0–34.0)
MCHC: 35.9 g/dL (ref 30.0–36.0)
MCV: 89.7 fL (ref 80.0–100.0)
Monocytes Absolute: 0.6 10*3/uL (ref 0.1–1.0)
Monocytes Relative: 11 %
Neutro Abs: 2.9 10*3/uL (ref 1.7–7.7)
Neutrophils Relative %: 49 %
Platelets: 277 10*3/uL (ref 150–400)
RBC: 5.03 MIL/uL (ref 4.22–5.81)
RDW: 13.2 % (ref 11.5–15.5)
WBC: 5.9 10*3/uL (ref 4.0–10.5)
nRBC: 0 % (ref 0.0–0.2)

## 2022-07-01 MED ORDER — FAMOTIDINE IN NACL 20-0.9 MG/50ML-% IV SOLN
20.0000 mg | Freq: Once | INTRAVENOUS | Status: AC
  Administered 2022-07-01: 20 mg via INTRAVENOUS
  Filled 2022-07-01: qty 50

## 2022-07-01 MED ORDER — METHYLPREDNISOLONE SODIUM SUCC 125 MG IJ SOLR
125.0000 mg | Freq: Once | INTRAMUSCULAR | Status: AC
  Administered 2022-07-01: 125 mg via INTRAVENOUS
  Filled 2022-07-01: qty 2

## 2022-07-01 MED ORDER — ESCITALOPRAM OXALATE 10 MG PO TABS: 20.0000 mg | ORAL_TABLET | Freq: Every day | ORAL | Status: DC

## 2022-07-01 MED ORDER — METHYLPREDNISOLONE SODIUM SUCC 40 MG IJ SOLR: 40.0000 mg | Freq: Two times a day (BID) | INTRAMUSCULAR | Status: DC

## 2022-07-01 MED ORDER — TRANEXAMIC ACID-NACL 1000-0.7 MG/100ML-% IV SOLN
1000.0000 mg | INTRAVENOUS | Status: AC
  Administered 2022-07-01: 1000 mg via INTRAVENOUS
  Filled 2022-07-01: qty 100

## 2022-07-01 MED ORDER — DIPHENHYDRAMINE HCL 25 MG PO CAPS
50.0000 mg | ORAL_CAPSULE | Freq: Once | ORAL | Status: AC
  Administered 2022-07-01: 50 mg via ORAL
  Filled 2022-07-01: qty 2

## 2022-07-01 MED ORDER — SODIUM CHLORIDE 0.9 % IV BOLUS
1000.0000 mL | Freq: Once | INTRAVENOUS | Status: AC
  Administered 2022-07-01: 1000 mL via INTRAVENOUS

## 2022-07-01 MED ORDER — AMLODIPINE BESYLATE 5 MG PO TABS: 10.0000 mg | ORAL_TABLET | Freq: Every day | ORAL | Status: DC

## 2022-07-01 MED ORDER — EPINEPHRINE 0.3 MG/0.3ML IJ SOAJ
0.3000 mg | Freq: Once | INTRAMUSCULAR | Status: AC
Start: 2022-07-01 — End: 2022-07-01
  Administered 2022-07-01: 0.3 mg via INTRAMUSCULAR
  Filled 2022-07-01: qty 0.3

## 2022-07-01 MED ORDER — SODIUM CHLORIDE 0.9% FLUSH
3.0000 mL | Freq: Two times a day (BID) | INTRAVENOUS | Status: DC
  Administered 2022-07-01: 3 mL via INTRAVENOUS

## 2022-07-01 MED ORDER — ATORVASTATIN CALCIUM 10 MG PO TABS: 20.0000 mg | ORAL_TABLET | Freq: Every day | ORAL | Status: DC

## 2022-07-01 MED ORDER — ENOXAPARIN SODIUM 40 MG/0.4ML IJ SOSY
40.0000 mg | PREFILLED_SYRINGE | INTRAMUSCULAR | Status: DC
  Administered 2022-07-01: 40 mg via SUBCUTANEOUS
  Filled 2022-07-01: qty 0.4

## 2022-07-01 MED ORDER — SODIUM CHLORIDE 0.9% IV SOLUTION: Freq: Once | INTRAVENOUS | Status: AC

## 2022-07-01 NOTE — ED Notes (Signed)
Pt resting comfortably. Respirations even and unlabored. No further swelling noted.

## 2022-07-01 NOTE — ED Provider Notes (Signed)
[facility] EMERGENCY DEPARTMENT AT [facility] Provider Note  CSN: [id number] Arrival date & time: 07/01/22 1339  Chief Complaint(s) Oral Swelling and Allergic Reaction  HPI Casey Warren is a 62 y.o. male with past medical history as below, significant for HTN on lisinopril, GERD, ulcer who presents to the ED with complaint of facial swelling. Pt reports waking up this am around 0830 with lip swelling/facial swelling.  He is on lisinopril over the past 12 months.  No recent dosage changes.  No recent medication or diet changes.  No history of prior reaction similar to this.  No exposure to known allergens such as citrus.  No recent travel or sick contacts.  No difficulty swallowing reports that his breathing is "heavy.  ".  Active difficulty speaking secondary to profound lip and face swelling.  Companied by daughter.  Past Medical History Past Medical History:  Diagnosis Date   Anxiety    related to surgery   GERD (gastroesophageal reflux disease)    History of blood transfusion    HTN (hypertension)    Ulcer    Patient Active Problem List   Diagnosis Date Noted   Angioedema 07/01/2022   Anxiety 07/01/2022   Hyponatremia 11/30/2019   AKI (acute kidney injury) (HCC) 11/30/2019   Hypotension due to medication 11/30/2019   Seizures (HCC) 11/30/2019   Abnormal EKG 04/06/2017   Essential hypertension 04/06/2017   ANEMIA 04/28/2008   Alcohol abuse 04/28/2008   TOBACCO ABUSE 04/28/2008   SUBSTANCE ABUSE, MULTIPLE 04/28/2008   Dental caries 04/28/2008   GERD 04/28/2008   PEPTIC ULCER DISEASE, HELICOBACTER PYLORI POSITIVE 04/28/2008   Home Medication(s) Prior to Admission medications   Medication Sig Start Date End Date Taking? Authorizing Provider  amLODipine (NORVASC) 10 MG tablet TAKE 1 TABLET (10 MG TOTAL) BY MOUTH DAILY. Patient taking differently: Take 10 mg by mouth daily. 02/22/22 02/22/23 Yes Grayce Sessions, NP  aspirin EC 81 MG tablet Take 81 mg by mouth  daily.   Yes [provider]  atorvastatin (LIPITOR) 20 MG tablet TAKE 1 TABLET (20 MG TOTAL) BY MOUTH DAILY. Patient taking differently: Take 20 mg by mouth daily. 08/22/21 08/22/22 Yes Edwards, Kinnie Scales, NP  escitalopram (LEXAPRO) 20 MG tablet TAKE 1 TABLET (20 MG TOTAL) BY MOUTH DAILY. Patient taking differently: Take 20 mg by mouth daily. 02/22/22 02/22/23 Yes Grayce Sessions, NP                                                                                                                                    Past Surgical History Past Surgical History:  Procedure Laterality Date   CLOSED REDUCTION NASAL FRACTURE  11/19/2011   Procedure: CLOSED REDUCTION NASAL FRACTURE;  Surgeon: Flo Shanks, MD;  Location: [facility] OR;  Service: ENT;  Laterality: N/A;  CLOSED REDUCTION NASAL AND NASAL SEPTAL FRACTURE WITH STABILIZATION   No surgical  Family History Family History  Problem Relation Age of Onset   Stroke Mother        Died age 5   Hypertension Mother    Diabetes Mother    Breast cancer Mother    Leukemia Father        Died age 74    Social History Social History   Tobacco Use   Smoking status: Every Day    Packs/day: 0.50    Types: Cigars, Cigarettes   Smokeless tobacco: Never   Tobacco comments:    2 black-n-milds   Vaping Use   Vaping Use: Never used  Substance Use Topics   Alcohol use: Yes    Comment: Two cans of beer dialy.    Drug use: No   Allergies Citrus, Chlorthalidone, and Fruit extracts  Review of Systems Review of Systems  Constitutional:  Negative for chills and fever.  HENT:  Positive for facial swelling. Negative for trouble swallowing.   Eyes:  Negative for photophobia and visual disturbance.  Respiratory:  Positive for shortness of breath. Negative for cough.   Cardiovascular:  Negative for chest pain and palpitations.  Gastrointestinal:  Negative for abdominal pain, nausea and vomiting.  Endocrine: Negative for polydipsia and  polyuria.  Genitourinary:  Negative for difficulty urinating and hematuria.  Musculoskeletal:  Negative for gait problem and joint swelling.  Skin:  Negative for pallor and rash.  Neurological:  Negative for syncope and headaches.  Psychiatric/Behavioral:  Negative for agitation and confusion.     Physical Exam Vital Signs  I have reviewed the triage vital signs BP (!) 136/96   Pulse (!) 115   Temp 98.7 F (37.1 C) (Oral)   Resp (!) 27   Ht 5\' 3"  (1.6 m)   Wt 70.9 kg   SpO2 97%   BMI 27.69 kg/m  Physical Exam Vitals and nursing note reviewed.  Constitutional:      General: He is not in acute distress.    Appearance: He is well-developed. He is not ill-appearing or diaphoretic.  HENT:     Head: Normocephalic and atraumatic. No right periorbital erythema or left periorbital erythema.     Jaw: There is normal jaw occlusion.      Comments: No drooling stridor or trismus    Right Ear: External ear normal.     Left Ear: External ear normal.     Mouth/Throat:     Mouth: Mucous membranes are moist.     Comments: Poor dentition Uvula midline, no obvious large dental abscess No lesions in his mouth Eyes:     General: No scleral icterus. Cardiovascular:     Rate and Rhythm: Normal rate and regular rhythm.     Pulses: Normal pulses.     Heart sounds: Normal heart sounds.  Pulmonary:     Effort: Pulmonary effort is normal. No respiratory distress.     Breath sounds: Normal breath sounds.  Abdominal:     General: Abdomen is flat.     Palpations: Abdomen is soft.     Tenderness: There is no abdominal tenderness.  Musculoskeletal:        General: Normal range of motion.     Cervical back: Normal range of motion.     Right lower leg: No edema.     Left lower leg: No edema.  Skin:    General: Skin is warm and dry.     Capillary Refill: Capillary refill takes less than 2 seconds.  Neurological:     Mental  Status: He is alert and oriented to person, place, and time.     GCS:  GCS eye subscore is 4. GCS verbal subscore is 5. GCS motor subscore is 6.  Psychiatric:        Mood and Affect: Mood normal.        Behavior: Behavior normal.     ED Results and Treatments Labs (all labs ordered are listed, but only abnormal results are displayed) Labs Reviewed  BASIC METABOLIC PANEL - Abnormal; Notable for the following components:      Result Value   Sodium 132 (*)    Glucose, Bld 111 (*)    BUN 5 (*)    All other components within normal limits  CBC WITH DIFFERENTIAL/PLATELET  COMPREHENSIVE METABOLIC PANEL  CBC  HIV ANTIBODY (ROUTINE TESTING W REFLEX)  PREPARE FRESH FROZEN PLASMA                                                                                                                          Radiology DG Chest Portable 1 View  Result Date: 07/01/2022 CLINICAL DATA:  Angioedema. EXAM: PORTABLE CHEST 1 VIEW COMPARISON:  X-ray 01/13/2017 and older FINDINGS: No consolidation, pneumothorax or effusion. No edema. Normal cardiopericardial silhouette. Overlapping cardiac leads. IMPRESSION: No acute cardiopulmonary disease Electronically Signed   By: Karen Kays M.D.   On: 07/01/2022 16:57    Pertinent labs & imaging results that were available during my care of the patient were reviewed by me and considered in my medical decision making (see MDM for details).  Medications Ordered in ED Medications  atorvastatin (LIPITOR) tablet 20 mg (has no administration in time range)  amLODipine (NORVASC) tablet 10 mg (has no administration in time range)  escitalopram (LEXAPRO) tablet 20 mg (has no administration in time range)  enoxaparin (LOVENOX) injection 40 mg (40 mg Subcutaneous Given 07/01/22 1931)  sodium chloride flush (NS) 0.9 % injection 3 mL (3 mLs Intravenous Given by Other 07/01/22 2201)  methylPREDNISolone sodium succinate (SOLU-MEDROL) 40 mg/mL injection 40 mg (has no administration in time range)  diphenhydrAMINE (BENADRYL) capsule 50 mg (50 mg Oral Given  07/01/22 1412)  EPINEPHrine (EPI-PEN) injection 0.3 mg (0.3 mg Intramuscular Given 07/01/22 1411)  methylPREDNISolone sodium succinate (SOLU-MEDROL) 125 mg/2 mL injection 125 mg (125 mg Intravenous Given 07/01/22 1453)  famotidine (PEPCID) IVPB 20 mg premix (0 mg Intravenous Stopped 07/01/22 1524)  0.9 %  sodium chloride infusion (Manually program via Guardrails IV Fluids) (0 mLs Intravenous Stopped 07/02/22 0038)  tranexamic acid (CYKLOKAPRON) IVPB 1,000 mg (0 mg Intravenous Stopped 07/01/22 1547)  sodium chloride 0.9 % bolus 1,000 mL (0 mLs Intravenous Stopped 07/01/22 1717)  Procedures .Critical Care  Performed by: Jeanell Sparrow, DO Authorized by: Jeanell Sparrow, DO   Critical care provider statement:    Critical care time (minutes):  30   Critical care time was exclusive of:  Separately billable procedures and treating other patients   Critical care was necessary to treat or prevent imminent or life-threatening deterioration of the following conditions: angioedemia, given epi and ffp.   Critical care was time spent personally by me on the following activities:  Development of treatment plan with patient or surrogate, discussions with consultants, evaluation of patient's response to treatment, examination of patient, ordering and review of laboratory studies, ordering and review of radiographic studies, ordering and performing treatments and interventions, pulse oximetry, re-evaluation of patient's condition, review of old charts and obtaining history from patient or surrogate   Care discussed with: admitting provider     (including critical care time)  Medical Decision Making / ED Course   MDM:  Casey Warren is a 62 y.o. male with past medical history as below, significant for HTN on lisinopril, GERD, ulcer who presents to the ED with complaint of facial  swelling. . The complaint involves an extensive differential diagnosis and also carries with it a high risk of complications and morbidity.  Serious etiology was considered. Ddx includes but is not limited to: Anaphylaxis, angioedema, Ludwig angina, allergic reaction, etc.  On initial assessment the patient is: Resting comfortably on ambient air, HDS. Vital signs and nursing notes were reviewed    Patient with angioedema, slight difficulty with breathing.  He is not hypoxic.  Give epinephrine, steroids, Benadryl, Pepcid.  Will also give TXA.  Consider FFP if no change.  No improvement after TXA, feels the swelling is getting worse, will give FFP x2   Facial swelling proved on recheck after FFP.  Recommend overnight observation, discussed with [facility] who accepts pt for admission.    Additional history obtained: -Additional history obtained from na -External records from outside source obtained and reviewed including: Chart review including previous notes, labs, imaging, consultation notes including primary care documentation, home meds, prior labs/imaging    Lab Tests: -I ordered, reviewed, and interpreted labs.   The pertinent results include:   Labs Reviewed  BASIC METABOLIC PANEL - Abnormal; Notable for the following components:      Result Value   Sodium 132 (*)    Glucose, Bld 111 (*)    BUN 5 (*)    All other components within normal limits  CBC WITH DIFFERENTIAL/PLATELET  COMPREHENSIVE METABOLIC PANEL  CBC  HIV ANTIBODY (ROUTINE TESTING W REFLEX)  PREPARE FRESH FROZEN PLASMA    Notable for stable  EKG   EKG Interpretation  Date/Time:    Ventricular Rate:    PR Interval:    QRS Duration:   QT Interval:    QTC Calculation:   R Axis:     Text Interpretation:           Imaging Studies ordered: I ordered imaging studies including chest x-ray I independently visualized the following imaging with scope of interpretation limited to determining acute life threatening  conditions related to emergency care: Unremarkable I independently visualized and interpreted imaging. I agree with the radiologist interpretation   Medicines ordered and prescription drug management: Meds ordered this encounter  Medications   diphenhydrAMINE (BENADRYL) capsule 50 mg   EPINEPHrine (EPI-PEN) injection 0.3 mg   methylPREDNISolone sodium succinate (SOLU-MEDROL) 125 mg/2 mL injection 125 mg    IV methylprednisolone will be  converted to either a q12h or q24h frequency with the same total daily dose (TDD).  Ordered Dose: 1 to 125 mg TDD; convert to: TDD q24h.  Ordered Dose: 126 to 250 mg TDD; convert to: TDD div q12h.  Ordered Dose: >250 mg TDD; DAW.   famotidine (PEPCID) IVPB 20 mg premix   0.9 %  sodium chloride infusion (Manually program via Guardrails IV Fluids)   tranexamic acid (CYKLOKAPRON) IVPB 1,000 mg   sodium chloride 0.9 % bolus 1,000 mL   atorvastatin (LIPITOR) tablet 20 mg   amLODipine (NORVASC) tablet 10 mg   escitalopram (LEXAPRO) tablet 20 mg   enoxaparin (LOVENOX) injection 40 mg   sodium chloride flush (NS) 0.9 % injection 3 mL   methylPREDNISolone sodium succinate (SOLU-MEDROL) 40 mg/mL injection 40 mg    IV methylprednisolone will be converted to either a q12h or q24h frequency with the same total daily dose (TDD).  Ordered Dose: 1 to 125 mg TDD; convert to: TDD q24h.  Ordered Dose: 126 to 250 mg TDD; convert to: TDD div q12h.  Ordered Dose: >250 mg TDD; DAW.    -I have reviewed the patients home medicines and have made adjustments as needed   Consultations Obtained: Not applicable  Cardiac Monitoring: The patient was maintained on a cardiac monitor.  I personally viewed and interpreted the cardiac monitored which showed an underlying rhythm of: NSR  Social Determinants of Health:  Diagnosis or treatment significantly limited by social determinants of health: current smoker Counseled patient for approximately 3 minutes regarding smoking  cessation. Discussed risks of smoking and how they applied and affected their visit here today. Patient not ready to quit at this time, however will follow up with their primary doctor when they are.   CPT code: 16109: intermediate counseling for smoking cessation     Reevaluation: After the interventions noted above, I reevaluated the patient and found that they have improved  Co morbidities that complicate the patient evaluation  Past Medical History:  Diagnosis Date   Anxiety    related to surgery   GERD (gastroesophageal reflux disease)    History of blood transfusion    HTN (hypertension)    Ulcer       Dispostion: Disposition decision including need for hospitalization was considered, and patient admitted to the hospital.    Final Clinical Impression(s) / ED Diagnoses Final diagnoses:  Angioedema, initial encounter     This chart was dictated using voice recognition software.  Despite best efforts to proofread,  errors can occur which can change the documentation meaning.    Sloan Leiter, DO 07/02/22 [phone]

## 2022-07-01 NOTE — ED Provider Triage Note (Signed)
Emergency Medicine Provider Triage Evaluation Note  Casey Warren , a 62 y.o. male  was evaluated in triage.  Pt complains of oral swelling onset 8:30 AM this morning. Notes that he was eating grapes last night. He took his lisinopril last night. Denies any new medications, soaps, lotions, detergents, foods.  He has been taking lisinopril x 1 year for his HTN. He took his lisinopril prior to the onset of his symptoms. Denies shortness of breath, nausea, vomiting, rash, abdominal pain, chest pain.    Review of Systems  Positive:  Negative:   Physical Exam  BP 139/86   Pulse (!) 110   Temp 98.9 F (37.2 C) (Oral)   Resp 20   Ht 5\' 3"  (1.6 m)   Wt 70.9 kg   SpO2 97%   BMI 27.69 kg/m  Gen:   Awake, no distress   Resp:  Normal effort  MSK:   Moves extremities without difficulty  Other:  Oropharynx clear.  Able to speak in clear complete sentences.  Patent airway.  Moderate swelling noted to left sided lips and face and jaw.  Tenderness to palpation noted to left sided lips.  Medical Decision Making  Medically screening exam initiated at 1:54 PM.  Appropriate orders placed.  Casey Warren was informed that the remainder of the evaluation will be completed by another provider, this initial triage assessment does not replace that evaluation, and the importance of remaining in the ED until their evaluation is complete.  2:06 PM - Discussed with RN that patient is in need of a room immediately. RN aware and working on room placement.    Casey Warren A, PA-C 07/01/22 1614

## 2022-07-01 NOTE — H&P (Signed)
History and Physical   Casey Warren GEX:[id number] DOB: Aug 29, 1960 DOA: 07/01/2022  PCP: Grayce Sessions, NP   Patient coming from: Home  Chief Complaint: Patient is swelling  HPI: Casey Warren is a 62 y.o. male with medical history significant of seizures, PUD, GERD, hypertension, anemia, alcohol use, anxiety presenting with facial swelling for the past day.  Patient reports that around 8 or 830 this morning Casey Warren began to have left-sided facial swelling primarily at his lips this has been gradually worsening throughout the day.  Casey Warren presented to the ED for further evaluation.  Casey Warren reports no prior history of the same.  Does have allergies to certain fruits but can usually tolerate grapes.  Casey Warren has been on lisinopril for the past year with his last dose being last night.  Casey Warren denied any difficulty swallowing.  Casey Warren denied any shortness of breath but did state that his breathing feels "heavy ".  Casey Warren denies fevers, chills, chest pain, abdominal bloating, constipation, diarrhea, nausea, vomiting.  ED Course: Vital signs in the ED significant for blood pressure in the 120s 130s systolic, heart rate in the 80s to 100s, saturating well on room air with normal respiration rate.  Lab workup included BMP with sodium 132, glucose 111.  CBC within normal limits.  Chest x-ray showed no acute normality.  Patient received Solu-Medrol, EpiPen, Pepcid, Benadryl, tranexamic acid, FFP in the ED.  Also received a liter of fluids.  Review of Systems: As per HPI otherwise all other systems reviewed and are negative.  Past Medical History:  Diagnosis Date   Anxiety    related to surgery   GERD (gastroesophageal reflux disease)    History of blood transfusion    HTN (hypertension)    Ulcer     Past Surgical History:  Procedure Laterality Date   CLOSED REDUCTION NASAL FRACTURE  11/19/2011   Procedure: CLOSED REDUCTION NASAL FRACTURE;  Surgeon: Flo Shanks, MD;  Location: [facility] OR;  Service: ENT;  Laterality:  N/A;  CLOSED REDUCTION NASAL AND NASAL SEPTAL FRACTURE WITH STABILIZATION   No surgical      Social History  reports that Casey Warren has been smoking cigars and cigarettes. Casey Warren has been smoking an average of .5 packs per day. Casey Warren has never used smokeless tobacco. Casey Warren reports current alcohol use. Casey Warren reports that Casey Warren does not use drugs.  Allergies  Allergen Reactions   Citrus Swelling   Chlorthalidone Other (See Comments)    Hyponatremia   Fruit Extracts Swelling    Pt experiences swelling with most fruits    Family History  Problem Relation Age of Onset   Stroke Mother        Died age 77   Hypertension Mother    Diabetes Mother    Breast cancer Mother    Leukemia Father        Died age 31  Reviewed on admission  Prior to Admission medications   Medication Sig Start Date End Date Taking? Authorizing Provider  amLODipine (NORVASC) 10 MG tablet TAKE 1 TABLET (10 MG TOTAL) BY MOUTH DAILY. Patient taking differently: Take 10 mg by mouth daily. 02/22/22 02/22/23 Yes Grayce Sessions, NP  aspirin EC 81 MG tablet Take 81 mg by mouth daily.   Yes [provider]  atorvastatin (LIPITOR) 20 MG tablet TAKE 1 TABLET (20 MG TOTAL) BY MOUTH DAILY. Patient taking differently: Take 20 mg by mouth daily. 08/22/21 08/22/22 Yes Grayce Sessions, NP  escitalopram (LEXAPRO) 20 MG tablet TAKE 1  TABLET (20 MG TOTAL) BY MOUTH DAILY. Patient taking differently: Take 20 mg by mouth daily. 02/22/22 02/22/23 Yes Kerin Perna, NP    Physical Exam: Vitals:   07/01/22 1815 07/01/22 1845 07/01/22 1915 07/01/22 1930  BP: (!) 132/91 (!) 131/93  134/83  Pulse: 89 81  87  Resp: 19 16  16   Temp:   97.9 F (36.6 C)   TempSrc:   Oral   SpO2: 98% 97%  99%  Weight:      Height:        Physical Exam Constitutional:      General: Casey Warren is not in acute distress.    Appearance: Normal appearance.  HENT:     Head: Normocephalic and atraumatic.     Mouth/Throat:     Mouth: Mucous membranes are moist.  Angioedema present.     Pharynx: Oropharynx is clear.     Comments: Poor Dentition Eyes:     Extraocular Movements: Extraocular movements intact.     Pupils: Pupils are equal, round, and reactive to light.  Cardiovascular:     Rate and Rhythm: Normal rate and regular rhythm.     Pulses: Normal pulses.     Heart sounds: Normal heart sounds.  Pulmonary:     Effort: Pulmonary effort is normal. No respiratory distress.     Breath sounds: Normal breath sounds.  Abdominal:     General: Bowel sounds are normal. There is no distension.     Palpations: Abdomen is soft.     Tenderness: There is no abdominal tenderness.  Musculoskeletal:        General: No swelling or deformity.  Skin:    General: Skin is warm and dry.  Neurological:     General: No focal deficit present.     Mental Status: Mental status is at baseline.    Labs on Admission: I have personally reviewed following labs and imaging studies  CBC: Recent Labs  Lab 07/01/22 1445  WBC 5.9  NEUTROABS 2.9  HGB 16.2  HCT 45.1  MCV 89.7  PLT 505    Basic Metabolic Panel: Recent Labs  Lab 07/01/22 1445  NA 132*  K 4.0  CL 98  CO2 23  GLUCOSE 111*  BUN 5*  CREATININE 1.01  CALCIUM 8.9    GFR: Estimated Creatinine Clearance: 66.2 mL/min (by C-G formula based on SCr of 1.01 mg/dL).  Liver Function Tests: No results for input(s): "AST", "ALT", "ALKPHOS", "BILITOT", "PROT", "ALBUMIN" in the last 168 hours.  Urine analysis:    Component Value Date/Time   COLORURINE AMBER (A) 11/30/2019 2310   APPEARANCEUR CLOUDY (A) 11/30/2019 2310   LABSPEC 1.021 11/30/2019 2310   PHURINE 5.0 11/30/2019 2310   GLUCOSEU NEGATIVE 11/30/2019 2310   HGBUR NEGATIVE 11/30/2019 2310   BILIRUBINUR NEGATIVE 11/30/2019 2310   KETONESUR 5 (A) 11/30/2019 2310   PROTEINUR 30 (A) 11/30/2019 2310   UROBILINOGEN 0.2 02/09/2008 0800   NITRITE NEGATIVE 11/30/2019 2310   LEUKOCYTESUR NEGATIVE 11/30/2019 2310    Radiological Exams on  Admission: DG Chest Portable 1 View  Result Date: 07/01/2022 CLINICAL DATA:  Angioedema. EXAM: PORTABLE CHEST 1 VIEW COMPARISON:  X-ray 01/13/2017 and older FINDINGS: No consolidation, pneumothorax or effusion. No edema. Normal cardiopericardial silhouette. Overlapping cardiac leads. IMPRESSION: No acute cardiopulmonary disease Electronically Signed   By: Jill Side M.D.   On: 07/01/2022 16:57    EKG: Not performed in the ED  Assessment/Plan Principal Problem:   Angioedema Active Problems:   Essential hypertension  Anxiety   Angioedema > Patient presenting with new onset angioedema started this morning with gradual worsening. > No difficulty swallowing,  no true shortness of breath but does describe "heavy "feeling of breathing. > Denies any prior history of this.  Has been on lisinopril for the past year with last dose last night. > including Does have some history of allergies and eating fruit, but cannot tolerate grapes.  Did not eat any fruits recently. > Did not respond significantly to anaphylaxis cocktail in the ED including Solu-Medrol, EpiPen, Pepcid, Benadryl. > Did also received tranexamic acid and FFP in case of complement disorder. - Monitor on progressive unit with continuous pulse ox - Diet for now - Continue Solu-Medrol at 40 mg twice daily for now - Cetirizine twice daily - Check C4 level - Supportive care  Hypertension - Continue home amlodipine - Lisinopril discontinued as above  HLD - Continue home atorvastatin.  Anxiety - Continue Lexapro  DVT prophylaxis: Lovenox Code Status:   Full Family Communication:  Updated at bedside  Disposition Plan:   Patient is from:  Home  Anticipated DC to:  Home  Anticipated DC date:  1 to 2 days  Anticipated DC barriers: None  Consults called:  None Admission status:  Observation, Telemetry  Severity of Illness: The appropriate patient status for this patient is OBSERVATION. Observation status is judged to be  reasonable and necessary in order to provide the required intensity of service to ensure the patient's safety. The patient's presenting symptoms, physical exam findings, and initial radiographic and laboratory data in the context of their medical condition is felt to place them at decreased risk for further clinical deterioration. Furthermore, it is anticipated that the patient will be medically stable for discharge from the hospital within 2 midnights of admission.    Marcelyn Bruins MD [facility]  How to contact the [facility] Attending or Consulting provider [place] or covering provider during after hours [place], for this patient?   Check the care team in [facility] and look for a) attending/consulting [facility] provider listed and b) the [facility] team listed Log into [email or web address] and use [place]'s universal password to access. If you do not have the password, please contact the hospital operator. Locate the [facility] provider you are looking for under [facility] and page to a number that you can be directly reached. If you still have difficulty reaching the provider, please page the [facility] (Director on Call) for the Hospitalists listed on amion for assistance.  07/01/2022, 8:07 PM

## 2022-07-01 NOTE — ED Notes (Signed)
Pt reports worsening swelling to top lip.

## 2022-07-01 NOTE — ED Triage Notes (Signed)
Pt woke up at 830am with left side of face swollen and lips swollen. Pt states he ate 4 grapes around 330 am. Pt is allergic to a lot of fruit but has not been allergic to grapes before.  Pt is talking in complete sentences. Pt has not taking any medication today but states swelling is getting worse.

## 2022-07-01 NOTE — Telephone Encounter (Signed)
  Chief Complaint: face swelling  Symptoms: L sided swelling of face, lips and jaw, getting hard to breathe  Frequency: this morning  Pertinent Negatives: NA Disposition: [x] ED /[] Urgent Care (no appt availability in office) / [] Appointment(In office/virtual)/ []  [place] [facility]/ [] Home Care/ [] Refused Recommended Disposition /[] [place] [facility]/ []  Follow-up with PCP Additional Notes: pt states he woke up with L sided facial swelling and doesn't sleep on L side of face so unsure what caused swelling but breathing was getting harder, advised pt go to ED. Pt verbalized understanding.   Reason for Disposition  SEVERE swelling of the entire face  Answer Assessment - Initial Assessment Questions 1. ONSET: "When did the swelling start?" (e.g., minutes, hours, days)     This morning  2. LOCATION: "What part of the face is swollen?"     L side of face  3. SEVERITY: "How swollen is it?"     Moderate  4. ITCHING: "Is there any itching?" If Yes, ask: "How much?"   (Scale 1-10; mild, moderate or severe)     no 5. PAIN: "Is the swelling painful to touch?" If Yes, ask: "How painful is it?"   (Scale 1-10; mild, moderate or severe)   - NONE (0): no pain   - MILD (1-3): doesn't interfere with normal activities    - MODERATE (4-7): interferes with normal activities or awakens from sleep    - SEVERE (8-10): excruciating pain, unable to do any normal activities      no 9. OTHER SYMPTOMS: "Do you have any other symptoms?" (e.g., toothache, leg swelling)     Getting to hard to breathe  Protocols used: Face Swelling-A-AH

## 2022-07-01 NOTE — ED Provider Notes (Signed)
62 year old male presents with complaint of left-sided facial swelling onset this morning around 8 AM when he woke up, progressively worsening.  Denies difficulty breathing or swallowing.  No similar reactions previously. States that he is allergic to other fruit however can normally eat grapes, unsure if this is related or not.  Did take lisinopril with last dose last night. Patient is placed on monitor, medications ordered, discussed with patient plan.  Lungs clear to auscultation.    Tacy Learn, PA-C 07/01/22 1436    Tegeler, Gwenyth Allegra, MD 07/01/22 1600

## 2022-07-02 DIAGNOSIS — T783XXA Angioneurotic edema, initial encounter: Secondary | ICD-10-CM | POA: Diagnosis not present

## 2022-07-02 LAB — BPAM FFP
Blood Product Expiration Date: 202401272359
Blood Product Expiration Date: 202401272359
ISSUE DATE / TIME: 202401221704
ISSUE DATE / TIME: 202401222154
Unit Type and Rh: 7300
Unit Type and Rh: 7300

## 2022-07-02 LAB — CBC
HCT: 40.3 % (ref 39.0–52.0)
Hemoglobin: 14.3 g/dL (ref 13.0–17.0)
MCH: 31.4 pg (ref 26.0–34.0)
MCHC: 35.5 g/dL (ref 30.0–36.0)
MCV: 88.4 fL (ref 80.0–100.0)
Platelets: 242 10*3/uL (ref 150–400)
RBC: 4.56 MIL/uL (ref 4.22–5.81)
RDW: 13.2 % (ref 11.5–15.5)
WBC: 5.2 10*3/uL (ref 4.0–10.5)
nRBC: 0 % (ref 0.0–0.2)

## 2022-07-02 LAB — COMPREHENSIVE METABOLIC PANEL
ALT: 45 U/L — ABNORMAL HIGH (ref 0–44)
AST: 36 U/L (ref 15–41)
Albumin: 4 g/dL (ref 3.5–5.0)
Alkaline Phosphatase: 77 U/L (ref 38–126)
Anion gap: 11 (ref 5–15)
BUN: 5 mg/dL — ABNORMAL LOW (ref 8–23)
CO2: 21 mmol/L — ABNORMAL LOW (ref 22–32)
Calcium: 9.1 mg/dL (ref 8.9–10.3)
Chloride: 99 mmol/L (ref 98–111)
Creatinine, Ser: 1.01 mg/dL (ref 0.61–1.24)
GFR, Estimated: >60 mL/min (ref >60)
Glucose, Bld: 203 mg/dL — ABNORMAL HIGH (ref 70–99)
Potassium: 3.9 mmol/L (ref 3.5–5.1)
Sodium: 131 mmol/L — ABNORMAL LOW (ref 135–145)
Total Bilirubin: 0.4 mg/dL (ref 0.3–1.2)
Total Protein: 7.3 g/dL (ref 6.5–8.1)

## 2022-07-02 LAB — HIV ANTIBODY (ROUTINE TESTING W REFLEX): HIV Screen 4th Generation wRfx: NONREACTIVE

## 2022-07-02 LAB — PREPARE FRESH FROZEN PLASMA: Unit division: 0

## 2022-07-02 MED ORDER — POTASSIUM CHLORIDE CRYS ER 20 MEQ PO TBCR
20.0000 meq | EXTENDED_RELEASE_TABLET | Freq: Once | ORAL | Status: AC
  Administered 2022-07-02: 20 meq via ORAL
  Filled 2022-07-02: qty 1

## 2022-07-02 MED ORDER — FAMOTIDINE 20 MG PO TABS: 20.0000 mg | ORAL_TABLET | Freq: Two times a day (BID) | ORAL | 0 refills | Status: DC

## 2022-07-02 MED ORDER — PREDNISONE 20 MG PO TABS: 40.0000 mg | ORAL_TABLET | Freq: Every day | ORAL | 0 refills | Status: AC

## 2022-07-02 MED ORDER — LORATADINE 10 MG PO TABS: 10.0000 mg | ORAL_TABLET | Freq: Every day | ORAL | 0 refills | Status: DC

## 2022-07-02 MED ORDER — FUROSEMIDE 20 MG PO TABS
40.0000 mg | ORAL_TABLET | Freq: Once | ORAL | Status: AC
  Administered 2022-07-02: 40 mg via ORAL
  Filled 2022-07-02: qty 2

## 2022-07-02 NOTE — Progress Notes (Signed)
OT Cancellation Note  Patient Details Name: DENIS CARREON MRN: [id number] DOB: 07-08-1960   Cancelled Treatment:    Reason Eval/Treat Not Completed: OT screened, no needs identified, will sign off per PT, pt is independent. No formal OT eval needed at this time  Layla Maw 07/02/2022, 9:43 AM

## 2022-07-02 NOTE — ED Notes (Signed)
Pt in room with family. A&O x4. Updated on plan of care. Pt consumed 100% of breakfast and ambulated to restroom with a steady gait.

## 2022-07-02 NOTE — Discharge Summary (Signed)
Physician Discharge Summary  AKAASH VANDEWATER GDJ:[id number] DOB: 1960/12/30 DOA: 07/01/2022  PCP: Kerin Perna, NP  Admit date: 07/01/2022 Discharge date: 07/02/2022  Admitted From: Home Disposition: Home  Recommendations for Outpatient Follow-up:  Follow up with PCP in 1 week with repeat CBC/BMP Lisinopril has been discontinued.  Please do not restart it. recommend outpatient evaluation and follow-up by allergy specialist Follow up in ED if symptoms worsen or new appear   Home Health: No Equipment/Devices: None  Discharge Condition: Stable CODE STATUS: Full Diet recommendation: Heart healthy  Brief/Interim Summary:  62 y.o. male with medical history significant of seizures, PUD, GERD, hypertension, anemia, alcohol use, anxiety presented with facial swelling.  He was diagnosed with angioedema possibly from lisinopril use.  Chest x-ray showed no acute abnormality.  He received Solu-Medrol, EpiPen, Pepcid, Benadryl tranexamic acid and FFP in the ED and was kept under observation because of persistent facial swelling.  During the hospitalization, his swelling has much improved.  He is tolerating diet and wants to go home today.  He will be discharged home on oral prednisone, Pepcid, Claritin.  He would benefit from outpatient evaluation and follow-up by allergy specialist.  Lisinopril has been discontinued.  Discharge Diagnoses:   Possible angioedema -Lisinopril has been discontinued.  Please do not restart it.  Will benefit from outpatient evaluation and follow-up by allergy specialist. -He received Solu-Medrol, EpiPen, Pepcid, Benadryl tranexamic acid and FFP in the ED and was kept under observation because of persistent facial swelling.  During the hospitalization, his swelling has much improved.  He is tolerating diet and wants to go home today.  He will be discharged home on oral prednisone, Pepcid, Claritin.    Hypertension -Blood pressure still elevated.  Continue amlodipine.   Lisinopril plan as above.  Outpatient follow-up with PCP.  Hyperlipidemia -continue atorvastatin  Anxiety -Continue Lexapro  Discharge Instructions  Discharge Instructions     Ambulatory referral to Allergy   Complete by: As directed    Anaphylaxis follow up   Diet - low sodium heart healthy   Complete by: As directed    Increase activity slowly   Complete by: As directed       Allergies as of 07/02/2022       Reactions   Citrus Swelling   Chlorthalidone Other (See Comments)   Hyponatremia   Fruit Extracts Swelling   Pt experiences swelling with most fruits        Medication List     TAKE these medications    amLODipine 10 MG tablet Commonly known as: NORVASC TAKE 1 TABLET (10 MG TOTAL) BY MOUTH DAILY. What changed: how much to take   aspirin EC 81 MG tablet Take 81 mg by mouth daily.   atorvastatin 20 MG tablet Commonly known as: LIPITOR TAKE 1 TABLET (20 MG TOTAL) BY MOUTH DAILY. What changed: how much to take   escitalopram 20 MG tablet Commonly known as: LEXAPRO TAKE 1 TABLET (20 MG TOTAL) BY MOUTH DAILY. What changed: how much to take   famotidine 20 MG tablet Commonly known as: PEPCID Take 1 tablet (20 mg total) by mouth 2 (two) times daily for 7 days.   loratadine 10 MG tablet Commonly known as: Claritin Take 1 tablet (10 mg total) by mouth daily for 7 days.   predniSONE 20 MG tablet Commonly known as: DELTASONE Take 2 tablets (40 mg total) by mouth daily with breakfast for 7 days. Start taking on: July 03, 2022  Follow-up Information     Grayce Sessions, NP. Schedule an appointment as soon as possible for a visit in 1 week(s).   Specialty: Internal Medicine Contact information: [address] [place] [address] [phone]                Allergies  Allergen Reactions   Citrus Swelling   Chlorthalidone Other (See Comments)    Hyponatremia   Fruit Extracts Swelling    Pt experiences swelling with  most fruits    Consultations: None   Procedures/Studies: DG Chest Portable 1 View  Result Date: 07/01/2022 CLINICAL DATA:  Angioedema. EXAM: PORTABLE CHEST 1 VIEW COMPARISON:  X-ray 01/13/2017 and older FINDINGS: No consolidation, pneumothorax or effusion. No edema. Normal cardiopericardial silhouette. Overlapping cardiac leads. IMPRESSION: No acute cardiopulmonary disease Electronically Signed   By: Karen Kays M.D.   On: 07/01/2022 16:57      Subjective: Patient seen and examined at bedside.  Feels much better wants to go home today.  Facial swelling has much improved.  Tolerating diet.  No fever or vomiting reported.  Discharge Exam: Vitals:   07/02/22 0842 07/02/22 0925  BP:  (!) 139/99  Pulse:  83  Resp:  18  Temp: 98.4 F (36.9 C) 97.8 F (36.6 C)  SpO2:  99%    General: Pt is alert, awake, not in acute distress.  Currently on room air. Cardiovascular: rate controlled, S1/S2 + Respiratory: bilateral decreased breath sounds at bases Abdominal: Soft, NT, ND, bowel sounds + Extremities: no edema, no cyanosis    The results of significant diagnostics from this hospitalization (including imaging, microbiology, ancillary and laboratory) are listed below for reference.     Microbiology: No results found for this or any previous visit (from the past 240 hour(s)).   Labs: BNP (last 3 results) No results for input(s): "BNP" in the last 8760 hours. Basic Metabolic Panel: Recent Labs  Lab 07/01/22 1445 07/02/22 0330  NA 132* 131*  K 4.0 3.9  CL 98 99  CO2 23 21*  GLUCOSE 111* 203*  BUN 5* 5*  CREATININE 1.01 1.01  CALCIUM 8.9 9.1   Liver Function Tests: Recent Labs  Lab 07/02/22 0330  AST 36  ALT 45*  ALKPHOS 77  BILITOT 0.4  PROT 7.3  ALBUMIN 4.0   No results for input(s): "LIPASE", "AMYLASE" in the last 168 hours. No results for input(s): "AMMONIA" in the last 168 hours. CBC: Recent Labs  Lab 07/01/22 1445 07/02/22 0330  WBC 5.9 5.2   NEUTROABS 2.9  --   HGB 16.2 14.3  HCT 45.1 40.3  MCV 89.7 88.4  PLT 277 242   Cardiac Enzymes: No results for input(s): "CKTOTAL", "CKMB", "CKMBINDEX", "TROPONINI" in the last 168 hours. BNP: Invalid input(s): "POCBNP" CBG: No results for input(s): "GLUCAP" in the last 168 hours. D-Dimer No results for input(s): "DDIMER" in the last 72 hours. Hgb A1c No results for input(s): "HGBA1C" in the last 72 hours. Lipid Profile No results for input(s): "CHOL", "HDL", "LDLCALC", "TRIG", "CHOLHDL", "LDLDIRECT" in the last 72 hours. Thyroid function studies No results for input(s): "TSH", "T4TOTAL", "T3FREE", "THYROIDAB" in the last 72 hours.  Invalid input(s): "FREET3" Anemia work up No results for input(s): "VITAMINB12", "FOLATE", "FERRITIN", "TIBC", "IRON", "RETICCTPCT" in the last 72 hours. Urinalysis    Component Value Date/Time   COLORURINE AMBER (A) 11/30/2019 2310   APPEARANCEUR CLOUDY (A) 11/30/2019 2310   LABSPEC 1.021 11/30/2019 2310   PHURINE 5.0 11/30/2019 2310   GLUCOSEU NEGATIVE  11/30/2019 2310   HGBUR NEGATIVE 11/30/2019 2310   BILIRUBINUR NEGATIVE 11/30/2019 2310   KETONESUR 5 (A) 11/30/2019 2310   PROTEINUR 30 (A) 11/30/2019 2310   UROBILINOGEN 0.2 02/09/2008 0800   NITRITE NEGATIVE 11/30/2019 2310   LEUKOCYTESUR NEGATIVE 11/30/2019 2310   Sepsis Labs Recent Labs  Lab 07/01/22 1445 07/02/22 0330  WBC 5.9 5.2   Microbiology No results found for this or any previous visit (from the past 240 hour(s)).   Time coordinating discharge: 35 minutes  SIGNED:   Aline August, MD  [facility] 07/02/2022, 12:15 PM

## 2022-07-02 NOTE — Evaluation (Signed)
Physical Therapy Evaluation Patient Details Name: Casey Warren MRN: [id number] DOB: 07-25-1960 Today's Date: 07/02/2022  History of Present Illness  Patient is 62 y.o. male presenting with facial swelling for the past day. PMH significant of seizures, PUD, GERD, hypertension, anemia, alcohol use, anxiety. Patient admitted for angioedema.   Clinical Impression  Casey Warren is 62 y.o. male admitted with above HPI and diagnosis. Patient is currently limited by functional impairments below (see PT problem list). Patient lives with his wife, son, and grandchildren and is independent with no device for mobility at baseline. Patient evaluated by Physical Therapy with no further acute PT needs identified. All education has been completed and the patient has no further questions. He is mobilizing with no AD and supervision for gait and is completing bed mobility and transfers at Mod Independent level. See below for any follow-up Physical Therapy or equipment needs. PT is signing off. Thank you for this referral.        Recommendations for follow up therapy are one component of a multi-disciplinary discharge planning process, led by the attending physician.  Recommendations may be updated based on patient status, additional functional criteria and insurance authorization.  Follow Up Recommendations No PT follow up      Assistance Recommended at Discharge PRN  Patient can return home with the following  Assist for transportation    Equipment Recommendations None recommended by PT  Recommendations for Other Services       Functional Status Assessment Patient has had a recent decline in their functional status and demonstrates the ability to make significant improvements in function in a reasonable and predictable amount of time.     Precautions / Restrictions Precautions Precautions: Fall Restrictions Weight Bearing Restrictions: No      Mobility  Bed Mobility Overal bed mobility:  Modified Independent             General bed mobility comments: HOB slightly elevated, pt taking some extra time    Transfers Overall transfer level: Modified independent Equipment used: None               General transfer comment: no assist, pt able to rise and lower controlled with or without use of UE's    Ambulation/Gait Ambulation/Gait assistance: Supervision Gait Distance (Feet): 140 Feet Assistive device: None Gait Pattern/deviations: Step-through pattern, Decreased stride length, Trunk flexed Gait velocity: fair        Stairs            Wheelchair Mobility    Modified Rankin (Stroke Patients Only)       Balance Overall balance assessment: Mild deficits observed, not formally tested                                           Pertinent Vitals/Pain Pain Assessment Pain Assessment: No/denies pain    Home Living Family/patient expects to be discharged to:: Private residence Living Arrangements: Spouse/significant other;Children;Other relatives (wife, dtr, and 2 grandchildren) Available Help at Discharge: Family Type of Home: House Home Access: Stairs to enter Entrance Stairs-Rails: Can reach both Entrance Stairs-Number of Steps: 6   Home Layout: One level Home Equipment: Shower seat Additional Comments: wife uses seat but he stands.    Prior Function Prior Level of Function : Independent/Modified Independent  Hand Dominance   Dominant Hand: Right    Extremity/Trunk Assessment   Upper Extremity Assessment Upper Extremity Assessment: Overall WFL for tasks assessed    Lower Extremity Assessment Lower Extremity Assessment: Overall WFL for tasks assessed (5xsit<>stand: 13 seconds no UE use from standard chair)    Cervical / Trunk Assessment Cervical / Trunk Assessment: Normal  Communication   Communication: No difficulties  Cognition Arousal/Alertness: Awake/alert Behavior During  Therapy: WFL for tasks assessed/performed Overall Cognitive Status: Within Functional Limits for tasks assessed                                          General Comments      Exercises     Assessment/Plan    PT Assessment Patient does not need any further PT services  PT Problem List         PT Treatment Interventions      PT Goals (Current goals can be found in the Care Plan section)  Acute Rehab PT Goals Patient Stated Goal: get home for his birthday PT Goal Formulation: With patient Time For Goal Achievement: 07/03/22 Potential to Achieve Goals: Good    Frequency       Co-evaluation               AM-PAC PT "6 Clicks" Mobility  Outcome Measure Help needed turning from your back to your side while in a flat bed without using bedrails?: None Help needed moving from lying on your back to sitting on the side of a flat bed without using bedrails?: None Help needed moving to and from a bed to a chair (including a wheelchair)?: None Help needed standing up from a chair using your arms (e.g., wheelchair or bedside chair)?: None Help needed to walk in hospital room?: A Little Help needed climbing 3-5 steps with a railing? : A Little 6 Click Score: 22    End of Session Equipment Utilized During Treatment: Gait belt Activity Tolerance: Patient tolerated treatment well Patient left: in bed;with call bell/phone within reach Nurse Communication: Mobility status PT Visit Diagnosis: Other abnormalities of gait and mobility (R26.89);Difficulty in walking, not elsewhere classified (R26.2)    Time: 9735-3299 PT Time Calculation (min) (ACUTE ONLY): 13 min   Charges:   PT Evaluation $PT Eval Low Complexity: 1 Low         Verner Mould, DPT Acute Rehabilitation Services Office [phone]  07/02/22 10:51 AM

## 2022-07-03 ENCOUNTER — Telehealth (INDEPENDENT_AMBULATORY_CARE_PROVIDER_SITE_OTHER): Payer: Self-pay

## 2022-07-03 LAB — ABO/RH: ABO/RH(D): B POS

## 2022-07-03 NOTE — Telephone Encounter (Signed)
Transition Care Management Follow-up Telephone Call Date of discharge and from where: [facility] 07-02-22 Dx: Angioedema How have you been since you were released from the hospital? Doing ok  Any questions or concerns? No  Items Reviewed: Did the pt receive and understand the discharge instructions provided? Yes  Medications obtained and verified? Yes  Other? No  Any new allergies since your discharge? No  Dietary orders reviewed? Yes Do you have support at home? Yes   Home Care and Equipment/Supplies: Were home health services ordered? no If so, what is the name of the agency? na  Has the agency set up a time to come to the patient's home? not applicable Were any new equipment or medical supplies ordered?  No What is the name of the medical supply agency? na Were you able to get the supplies/equipment? not applicable Do you have any questions related to the use of the equipment or supplies? No  Functional Questionnaire: (I = Independent and D = Dependent) ADLs: I  Bathing/Dressing- I  Meal Prep- I  Eating- I  Maintaining continence- I  Transferring/Ambulation- I  Managing Meds- I  Follow up appointments reviewed:  PCP Hospital f/u appt confirmed? No- pt needs to see Juluis Mire NP no later than 07-17-22- please call pt and r/s his 07-19-22 appt.Marland Kitchen  [facility] f/u appt confirmed? No . Are transportation arrangements needed? No  If their condition worsens, is the pt aware to call PCP or go to the Emergency Dept.? Yes Was the patient provided with contact information for the PCP's office or ED? Yes Was to pt encouraged to call back with questions or concerns? Yes

## 2022-07-08 ENCOUNTER — Other Ambulatory Visit: Payer: Self-pay

## 2022-07-08 ENCOUNTER — Other Ambulatory Visit (INDEPENDENT_AMBULATORY_CARE_PROVIDER_SITE_OTHER): Payer: Self-pay | Admitting: Primary Care

## 2022-07-08 DIAGNOSIS — Z760 Encounter for issue of repeat prescription: Secondary | ICD-10-CM

## 2022-07-08 DIAGNOSIS — E782 Mixed hyperlipidemia: Secondary | ICD-10-CM

## 2022-07-09 NOTE — Telephone Encounter (Signed)
Requested medication (s) are due for refill today:   Yes  Requested medication (s) are on the active medication list:   Yes  Future visit scheduled:   Yes in 1 wk   Last ordered: 08/22/2021 #90, 1 refill  Returned because a lipid panel is due per protocol.      Requested Prescriptions  Pending Prescriptions Disp Refills   atorvastatin (LIPITOR) 20 MG tablet 90 tablet 1    Sig: TAKE 1 TABLET (20 MG TOTAL) BY MOUTH DAILY.     Cardiovascular:  Antilipid - Statins Failed - 07/08/2022  2:10 PM      Failed - Lipid Panel in normal range within the last 12 months    Cholesterol, Total  Date Value Ref Range Status  02/22/2021 134 100 - 199 mg/dL Final   LDL Chol Calc (NIH)  Date Value Ref Range Status  02/22/2021 66 0 - 99 mg/dL Final   HDL  Date Value Ref Range Status  02/22/2021 56 >39 mg/dL Final   Triglycerides  Date Value Ref Range Status  02/22/2021 56 0 - 149 mg/dL Final         Passed - Patient is not pregnant      Passed - Valid encounter within last 12 months    Recent Outpatient Visits           4 months ago Essential hypertension   [place] [place] [facility], NP   10 months ago Prediabetes   [place], Michelle P, NP   1 year ago Essential hypertension   [place], Michelle P, NP   1 year ago Screening for diabetes mellitus   [facility] Kerin Perna, NP   1 year ago Alcohol abuse   [place] [place], NP       Future Appointments             In 1 week Oletta Lamas [place] Cage, NP [place]   In 1 month Oletta Lamas, [place] Cage, NP [place]

## 2022-07-10 ENCOUNTER — Other Ambulatory Visit: Payer: Self-pay

## 2022-07-15 ENCOUNTER — Other Ambulatory Visit (INDEPENDENT_AMBULATORY_CARE_PROVIDER_SITE_OTHER): Payer: Self-pay | Admitting: Primary Care

## 2022-07-15 ENCOUNTER — Other Ambulatory Visit: Payer: Self-pay

## 2022-07-15 ENCOUNTER — Telehealth: Payer: Self-pay | Admitting: Emergency Medicine

## 2022-07-15 DIAGNOSIS — Z760 Encounter for issue of repeat prescription: Secondary | ICD-10-CM

## 2022-07-15 DIAGNOSIS — E782 Mixed hyperlipidemia: Secondary | ICD-10-CM

## 2022-07-15 NOTE — Telephone Encounter (Signed)
Copied from [place] [id number]. Topic: Appointment Scheduling - Scheduling Inquiry for Clinic >> Jul 15, 2022  4:22 PM Ludger Nutting wrote: Patient's wife called to confirm patient's hospital f/u for the 2/7 at 12pm. Advised patient's wife that appointment was schedule for 2/9 at [place] stated that a nurse called and rescheduled it for the 7th. No notes in chart about rescheduled appointment and unable to reach office. Please follow up with patient to confirm the correct time for hospital f/u appointment.

## 2022-07-16 NOTE — Telephone Encounter (Signed)
Returned pt wife call and left a detailed vm making her aware that pt appt is scheduled for 2/9 at 930am and if she has any questions or concerns to give [place] a call

## 2022-07-19 ENCOUNTER — Other Ambulatory Visit: Payer: Self-pay

## 2022-07-19 ENCOUNTER — Ambulatory Visit (INDEPENDENT_AMBULATORY_CARE_PROVIDER_SITE_OTHER): Payer: BLUE CROSS/BLUE SHIELD | Admitting: Primary Care

## 2022-07-19 ENCOUNTER — Encounter (INDEPENDENT_AMBULATORY_CARE_PROVIDER_SITE_OTHER): Payer: Self-pay | Admitting: Primary Care

## 2022-07-19 VITALS — BP 139/88 | HR 99 | Resp 16 | Ht 63.5 in | Wt 156.6 lb

## 2022-07-19 DIAGNOSIS — E782 Mixed hyperlipidemia: Secondary | ICD-10-CM | POA: Diagnosis not present

## 2022-07-19 DIAGNOSIS — I10 Essential (primary) hypertension: Secondary | ICD-10-CM

## 2022-07-19 DIAGNOSIS — F418 Other specified anxiety disorders: Secondary | ICD-10-CM | POA: Diagnosis not present

## 2022-07-19 DIAGNOSIS — Z09 Encounter for follow-up examination after completed treatment for conditions other than malignant neoplasm: Secondary | ICD-10-CM

## 2022-07-19 DIAGNOSIS — Z760 Encounter for issue of repeat prescription: Secondary | ICD-10-CM

## 2022-07-19 MED ORDER — ESCITALOPRAM OXALATE 20 MG PO TABS
20.0000 mg | ORAL_TABLET | Freq: Every day | ORAL | 1 refills | Status: DC
  Filled 2022-07-19: qty 90, fill #0
  Filled 2022-10-21: qty 30, 30d supply, fill #0
  Filled 2022-12-01: qty 30, 30d supply, fill #1
  Filled 2022-12-31 – 2023-01-08 (×2): qty 30, 30d supply, fill #2
  Filled 2023-02-11: qty 30, 30d supply, fill #3

## 2022-07-19 MED ORDER — AMLODIPINE BESYLATE 10 MG PO TABS
10.0000 mg | ORAL_TABLET | Freq: Every day | ORAL | 3 refills | Status: DC
  Filled 2022-07-19: qty 90, 90d supply, fill #0
  Filled 2022-10-21: qty 30, 30d supply, fill #0
  Filled 2022-12-01: qty 30, 30d supply, fill #1
  Filled 2022-12-31 – 2023-01-08 (×2): qty 30, 30d supply, fill #2
  Filled 2023-02-11: qty 30, 30d supply, fill #3

## 2022-07-19 MED ORDER — ATORVASTATIN CALCIUM 20 MG PO TABS
20.0000 mg | ORAL_TABLET | Freq: Every day | ORAL | 1 refills | Status: DC
  Filled 2022-07-19: qty 90, 90d supply, fill #0
  Filled 2022-10-21: qty 30, 30d supply, fill #1
  Filled 2022-12-01: qty 30, 30d supply, fill #2
  Filled 2022-12-31 – 2023-01-08 (×2): qty 30, 30d supply, fill #3

## 2022-07-19 NOTE — Progress Notes (Unsigned)
  [facility]   Subjective:   Casey Warren is a 62 y.o. male presents for hospital follow up and establish care. Admit date to the hospital was 07/01/22, patient was discharged from the hospital on 07/02/22, patient was admitted for:   Past Medical History:  Diagnosis Date   Anxiety    related to surgery   GERD (gastroesophageal reflux disease)    History of blood transfusion    HTN (hypertension)    Ulcer      Allergies  Allergen Reactions   Citrus Swelling   Chlorthalidone Other (See Comments)    Hyponatremia   Fruit Extracts Swelling    Pt experiences swelling with most fruits      Current Outpatient Medications on File Prior to Visit  Medication Sig Dispense Refill   amLODipine (NORVASC) 10 MG tablet Take 1 tablet (10 mg total) by mouth daily. 90 tablet 3   aspirin EC 81 MG tablet Take 81 mg by mouth daily.     atorvastatin (LIPITOR) 20 MG tablet TAKE 1 TABLET (20 MG TOTAL) BY MOUTH DAILY. (Patient not taking: Reported on 07/03/2022) 90 tablet 1   escitalopram (LEXAPRO) 20 MG tablet TAKE 1 TABLET (20 MG TOTAL) BY MOUTH DAILY. (Patient taking differently: Take 20 mg by mouth daily.) 90 tablet 1   famotidine (PEPCID) 20 MG tablet Take 1 tablet (20 mg total) by mouth 2 (two) times daily for 7 days. 14 tablet 0   loratadine (CLARITIN) 10 MG tablet Take 1 tablet (10 mg total) by mouth daily for 7 days. 7 tablet 0   No current facility-administered medications on file prior to visit.     Review of System: Comprehensive ROS Pertinent positive and negative noted in HPI    Objective:  Blood Pressure 139/88 (BP Location: Left Arm, Patient Position: Sitting, Cuff Size: Normal)   Pulse 99   Respiration 16   Height 5' 3.5" (1.613 m)   Weight 156 lb 9.6 oz (71 kg)   Oxygen Saturation 97%   Body Mass Index 27.31 kg/m   Filed Weights   07/19/22 0924  Weight: 156 lb 9.6 oz (71 kg)    Physical Exam:   General Appearance: Well nourished, in no apparent  distress. Eyes: PERRLA, EOMs, conjunctiva no swelling or erythema Sinuses: No Frontal/maxillary tenderness ENT/Mouth: Ext aud canals clear, TMs without erythema, bulging. No erythema, swelling, or exudate on post pharynx.  Tonsils not swollen or erythematous. Hearing normal.  Neck: Supple, thyroid normal.  Respiratory: Respiratory effort normal, BS equal bilaterally without rales, rhonchi, wheezing or stridor.  Cardio: RRR with no MRGs. Brisk peripheral pulses without edema.  Abdomen: Soft, + BS.  Non tender, no guarding, rebound, hernias, masses. Lymphatics: Non tender without lymphadenopathy.  Musculoskeletal: Full ROM, 5/5 strength, normal gait.  Skin: Warm, dry without rashes, lesions, ecchymosis.  Neuro: Cranial nerves intact. Normal muscle tone, no cerebellar symptoms. Sensation intact.  Psych: Awake and oriented X 3, normal affect, Insight and Judgment appropriate.    Assessment:   No diagnosis found.  No orders of the defined types were placed in this encounter.   This note has been created with Surveyor, quantity. Any transcriptional errors are unintentional.   Kerin Perna, NP 07/19/2022, 9:42 AM

## 2022-07-25 ENCOUNTER — Other Ambulatory Visit: Payer: Self-pay

## 2022-08-09 ENCOUNTER — Ambulatory Visit: Payer: Self-pay | Admitting: Allergy

## 2022-08-15 ENCOUNTER — Encounter: Payer: Self-pay | Admitting: Allergy & Immunology

## 2022-08-15 ENCOUNTER — Other Ambulatory Visit: Payer: Self-pay

## 2022-08-15 ENCOUNTER — Ambulatory Visit (INDEPENDENT_AMBULATORY_CARE_PROVIDER_SITE_OTHER): Payer: Self-pay | Admitting: Allergy & Immunology

## 2022-08-15 VITALS — BP 122/80 | HR 114 | Temp 98.7°F | Resp 12 | Ht 63.48 in | Wt 151.5 lb

## 2022-08-15 DIAGNOSIS — J310 Chronic rhinitis: Secondary | ICD-10-CM

## 2022-08-15 DIAGNOSIS — T464X5D Adverse effect of angiotensin-converting-enzyme inhibitors, subsequent encounter: Secondary | ICD-10-CM

## 2022-08-15 DIAGNOSIS — T63481D Toxic effect of venom of other arthropod, accidental (unintentional), subsequent encounter: Secondary | ICD-10-CM

## 2022-08-15 DIAGNOSIS — Z888 Allergy status to other drugs, medicaments and biological substances status: Secondary | ICD-10-CM

## 2022-08-15 DIAGNOSIS — R221 Localized swelling, mass and lump, neck: Secondary | ICD-10-CM

## 2022-08-15 DIAGNOSIS — T783XXD Angioneurotic edema, subsequent encounter: Secondary | ICD-10-CM

## 2022-08-15 MED ORDER — EPINEPHRINE 0.3 MG/0.3ML IJ SOAJ: 0.3000 mg | INTRAMUSCULAR | 2 refills | Status: DC | PRN

## 2022-08-15 NOTE — Progress Notes (Signed)
NEW PATIENT  Date of Service/Encounter:  08/15/22  Consult requested by: Kerin Perna, NP   Assessment:   Insect sting allergy  ACE inhibitor-aggravated angioedema - doing better on amlodipine  Chronic rhinitis  Throat swelling  Plan/Recommendations:   1. Insect sting allergy - We are going to get a stinging insect panel. - We are also getting a tryptase to rule out mast cell disease.  - Anaphylaxis management plan provided. - EpiPen training reviewed.  - We could consider venom immunotherapy (allergy shots) in the future if needed.   2. ACE inhibitor-aggravated angioedema, subsequent encounter - This type of swelling usually decreases in frequency over time. - This swelling is not histamine mediated, meaning antihistamines and steroids typically do not change the course. - However anecdotally, high dose antihistamines can provide some relief, therefore I recommend starting Zyrtec (cetirizine) 20mg  twice daily once the symptoms start. - This is a relatively inexpensive way to provide him with some tools to manage episodes at home. - Although the frequency of episodes with ARBs can occur, they are typically more rare.  - However if his blood pressure is controlled without the use of ACEI or ARBs, it would be prudent to avoid them completely.   3. Chronic rhinitis - We are going to get environmental allergy testing via the blood. - We will call you in 1-2 weeks with the results of the testing. - We could add some medications if your symptoms become more severe.   4. Food allergies - We are going to get some blood work to look at your fruit allergies.  - Anaphylaxis management plan provided. - EpiPen training reviewed.   5. Return in about 6 months (around 02/15/2023).   This note in its entirety was forwarded to the Provider who requested this consultation.  Subjective:   BRIER REID is a 62 y.o. male presenting today for evaluation of  Chief Complaint   Patient presents with   Establish Care    Known allergies to pollen, bees, banana, and citrus fruits per patient   Angioedema    To medication that patient was taking    Gaylord Shih has a history of the following: Patient Active Problem List   Diagnosis Date Noted   Angioedema 07/01/2022   Anxiety 07/01/2022   Hyponatremia 11/30/2019   AKI (acute kidney injury) ([place]) 11/30/2019   Hypotension due to medication 11/30/2019   Seizures ([place]) 11/30/2019   Abnormal EKG 04/06/2017   Essential hypertension 04/06/2017   ANEMIA 04/28/2008   Alcohol abuse 04/28/2008   TOBACCO ABUSE 04/28/2008   SUBSTANCE ABUSE, MULTIPLE 04/28/2008   Dental caries 04/28/2008   GERD 04/28/2008   PEPTIC ULCER DISEASE, HELICOBACTER PYLORI POSITIVE 04/28/2008    History obtained from: chart review and patient and his daughter .  Gaylord Shih was referred by Kerin Perna, NP.     Nicolaus is a 62 y.o. male presenting for an evaluation of angioedema . He started having swelling on January 22nd. He went to the hospital on January 23rd. He was on lisinopril and changed to amlodipine. He had an enlarged face with difficulty breathing and problems with it extending down to his throat. He had been on it for over one year when this started. He was taking it every day. He has not had more swelling since that time.   Asthma/Respiratory Symptom History: He has asthma. He has had this for years. He does not have any asthma pumps now. He does not  want any of these.  He has been using a Vicks inhaler.  Allergic Rhinitis Symptom History: He is allergic to pollen. IT causes problem with nasal congestion and stuffiness.  Symptoms are fairly well controlled. He can take a Benadryl and be OK with this. He doesn ot use any medications for his allergies nad it is in the house if he needs it.   Food Allergy Symptom History: He has swelling to bananas and citrus.  He also reacts to kiwi and a number of other fruits. He can  only tolerate grapes. Cooking is the only was he can eat any of this. He just does not touch anything with bananas in it.   He has a bee allergy that was diagnosed years ago. EpiPen is up to date.   Otherwise, there is no history of other atopic diseases, including drug allergies, stinging insect allergies, or contact dermatitis. There is no significant infectious history. Vaccinations are up to date.    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 If you have any questions, ask your nurse or doctor.          amLODipine 10 MG tablet Commonly known as: NORVASC Take 1 tablet (10 mg total) by mouth daily.   aspirin EC 81 MG tablet Take 81 mg by mouth daily.   atorvastatin 20 MG tablet Commonly known as: LIPITOR Take 1 tablet (20 mg total) by mouth daily.   EPINEPHrine 0.3 mg/0.3 mL Soaj injection Commonly known as: EPI-PEN Inject 0.3 mg into the muscle as needed for anaphylaxis. Started by: Valentina Shaggy, MD   escitalopram 20 MG tablet Commonly known as: LEXAPRO Take 1 tablet (20 mg total) by mouth daily.   famotidine 20 MG tablet Commonly known as: PEPCID Take 1 tablet (20 mg total) by  mouth 2 (two) times daily for 7 days.   loratadine 10 MG tablet Commonly known as: Claritin Take 1 tablet (10 mg total) by mouth daily for 7 days.        Birth History: non-contributory  Developmental History: non-contributory  Past Surgical History: Past Surgical History:  Procedure Laterality Date   CLOSED REDUCTION NASAL FRACTURE  11/19/2011   Procedure: CLOSED REDUCTION NASAL FRACTURE;  Surgeon: Jodi Marble, MD;  Location: [facility] OR;  Service: ENT;  Laterality: N/A;  CLOSED REDUCTION NASAL AND NASAL SEPTAL FRACTURE WITH STABILIZATION   No surgical       Family History: Family History  Problem Relation Age of Onset   Stroke Mother        Died age 72   Hypertension Mother    Diabetes Mother  Breast cancer Mother    Leukemia Father        Died age 85   Allergic rhinitis Sister    Asthma Grandson      Social History: Ying lives at home with his family.  They live in a house.  There is hardwood throughout the home.  They have electric heating and central cooling.  There is a dog inside of the home.  There are no dust mite covers on the bedding.  He does smoke cigarettes outside of the house.  He also smokes cigars.  There is no fume, chemical, or dust exposure.  There is a HEPA filter.  He does not live near an interstate or industrial area.   Review of Systems  Constitutional: Negative.  Negative for fever, malaise/fatigue and weight loss.  HENT: Negative.  Negative for congestion, ear discharge and ear pain.   Eyes:  Negative for pain, discharge and redness.  Respiratory:  Negative for cough, sputum production, shortness of breath and wheezing.   Cardiovascular: Negative.  Negative for chest pain and palpitations.  Gastrointestinal:  Negative for abdominal pain, heartburn, nausea and vomiting.  Skin: Negative.  Negative for itching and rash.  Neurological:  Negative for dizziness and headaches.  Endo/Heme/Allergies:  Positive for environmental allergies. Does not  bruise/bleed easily.       Objective:   Blood pressure 122/80, pulse (!) 114, temperature 98.7 F (37.1 C), temperature source Temporal, resp. rate 12, height 5' 3.48" (1.612 m), weight 151 lb 8 oz (68.7 kg), SpO2 97 %. Body mass index is 26.43 kg/m.     Physical Exam Vitals reviewed.  Constitutional:      Appearance: He is well-developed.  HENT:     Head: Normocephalic and atraumatic.     Right Ear: Tympanic membrane, ear canal and external ear normal. No drainage, swelling or tenderness. Tympanic membrane is not injected, scarred, erythematous, retracted or bulging.     Left Ear: Tympanic membrane, ear canal and external ear normal. No drainage, swelling or tenderness. Tympanic membrane is not injected, scarred, erythematous, retracted or bulging.     Nose: No nasal deformity, septal deviation, mucosal edema or rhinorrhea.     Right Turbinates: Enlarged, swollen and pale.     Left Turbinates: Enlarged, swollen and pale.     Right Sinus: No maxillary sinus tenderness or frontal sinus tenderness.     Left Sinus: No maxillary sinus tenderness or frontal sinus tenderness.     Mouth/Throat:     Mouth: Mucous membranes are not pale and not dry.     Pharynx: Uvula midline.  Eyes:     General:        Right eye: No discharge.        Left eye: No discharge.     Conjunctiva/sclera: Conjunctivae normal.     Right eye: Right conjunctiva is not injected. No chemosis.    Left eye: Left conjunctiva is not injected. No chemosis.    Pupils: Pupils are equal, round, and reactive to light.  Cardiovascular:     Rate and Rhythm: Normal rate and regular rhythm.     Heart sounds: Normal heart sounds.  Pulmonary:     Effort: Pulmonary effort is normal. No tachypnea, accessory muscle usage or respiratory distress.     Breath sounds: Normal breath sounds. No wheezing, rhonchi or rales.  Chest:     Chest wall: No tenderness.  Abdominal:     Tenderness: There is no abdominal tenderness. There is  no  guarding or rebound.  Lymphadenopathy:     Head:     Right side of head: No submandibular, tonsillar or occipital adenopathy.     Left side of head: No submandibular, tonsillar or occipital adenopathy.     Cervical: No cervical adenopathy.  Skin:    Coloration: Skin is not pale.     Findings: No abrasion, erythema, petechiae or rash. Rash is not papular, urticarial or vesicular.  Neurological:     Mental Status: He is alert.  Psychiatric:        Behavior: Behavior is cooperative.      Diagnostic studies: labs sent instead          Salvatore Marvel, MD [facility]

## 2022-08-15 NOTE — Patient Instructions (Addendum)
1. Insect sting allergy - We are going to get a stinging insect panel. - We are also getting a tryptase to rule out mast cell disease.  - Anaphylaxis management plan provided. - EpiPen training reviewed.  - We could consider venom immunotherapy (allergy shots) in the future if needed.   2. ACE inhibitor-aggravated angioedema, subsequent encounter - This type of swelling usually decreases in frequency over time. - This swelling is not histamine mediated, meaning antihistamines and steroids typically do not change the course. - However anecdotally, high dose antihistamines can provide some relief, therefore I recommend starting Zyrtec (cetirizine) '20mg'$  twice daily once the symptoms start. - This is a relatively inexpensive way to provide him with some tools to manage episodes at home. - Although the frequency of episodes with ARBs can occur, they are typically more rare.  - However if his blood pressure is controlled without the use of ACEI or ARBs, it would be prudent to avoid them completely.   3. Chronic rhinitis - We are going to get environmental allergy testing via the blood. - We will call you in 1-2 weeks with the results of the testing. - We could add some medications if your symptoms become more severe.   4. Food allergies - We are going to get some blood work to look at your fruit allergies.  - Anaphylaxis management plan provided. - EpiPen training reviewed.   5. Return in about 6 months (around 02/15/2023).    Please inform us of any Emergency Department visits, hospitalizations, or changes in symptoms. Call us before going to the ED for breathing or allergy symptoms since we might be able to fit you in for a sick visit. Feel free to contact us anytime with any questions, problems, or concerns.  It was a pleasure to meet you and your FAVORITE daughter today!  Websites that have reliable patient information: 1. American Academy of Asthma, Allergy, and Immunology:  www.aaaai.org 2. Food Allergy Research and Education (FARE): foodallergy.org 3. Mothers of Asthmatics: http://www.asthmacommunitynetwork.org 4. American College of Allergy, Asthma, and Immunology: www.acaai.org   COVID-19 Vaccine Information can be found at: ShippingScam.co.uk For questions related to vaccine distribution or appointments, please email [email or web address] or call [phone].   We realize that you might be concerned about having an allergic reaction to the COVID19 vaccines. To help with that concern, WE ARE OFFERING THE COVID19 VACCINES IN OUR OFFICE! Ask the front desk for dates!     "Like" [place] on Facebook and Instagram for our latest updates!      A healthy democracy works best when [employer] participate! Make sure you are registered to vote! If you have moved or changed any of your contact information, you will need to get this updated before voting!  In some cases, you MAY be able to register to vote online: [email or web address]

## 2022-08-16 ENCOUNTER — Encounter: Payer: Self-pay | Admitting: Allergy & Immunology

## 2022-08-16 NOTE — Addendum Note (Signed)
Addended by: Valentina Shaggy on: 08/16/2022 05:38 AM   Modules accepted: Level of Service

## 2022-08-23 ENCOUNTER — Ambulatory Visit (INDEPENDENT_AMBULATORY_CARE_PROVIDER_SITE_OTHER): Payer: Self-pay | Admitting: Primary Care

## 2022-10-21 ENCOUNTER — Other Ambulatory Visit: Payer: Self-pay

## 2022-10-23 ENCOUNTER — Other Ambulatory Visit: Payer: Self-pay

## 2022-10-23 ENCOUNTER — Encounter (INDEPENDENT_AMBULATORY_CARE_PROVIDER_SITE_OTHER): Payer: Self-pay | Admitting: Primary Care

## 2022-10-23 ENCOUNTER — Ambulatory Visit (INDEPENDENT_AMBULATORY_CARE_PROVIDER_SITE_OTHER): Payer: Self-pay | Admitting: Primary Care

## 2022-10-23 VITALS — BP 127/86 | HR 91 | Resp 16 | Ht 63.5 in | Wt 151.6 lb

## 2022-10-23 DIAGNOSIS — I10 Essential (primary) hypertension: Secondary | ICD-10-CM

## 2022-10-23 DIAGNOSIS — M25561 Pain in right knee: Secondary | ICD-10-CM

## 2022-10-23 DIAGNOSIS — M25562 Pain in left knee: Secondary | ICD-10-CM

## 2022-10-23 DIAGNOSIS — Z1211 Encounter for screening for malignant neoplasm of colon: Secondary | ICD-10-CM

## 2022-10-23 DIAGNOSIS — R7303 Prediabetes: Secondary | ICD-10-CM

## 2022-10-23 DIAGNOSIS — Z23 Encounter for immunization: Secondary | ICD-10-CM

## 2022-10-23 DIAGNOSIS — G8929 Other chronic pain: Secondary | ICD-10-CM

## 2022-10-23 LAB — POCT GLYCOSYLATED HEMOGLOBIN (HGB A1C): HbA1c, POC (prediabetic range): 5.3 % — AB (ref 5.7–6.4)

## 2022-10-23 NOTE — Progress Notes (Unsigned)
[facility]  Casey Warren, is a 62 y.o. male  [id number]  [id number]  DOB - [birth year]  Chief Complaint  Patient presents with   Hypertension       Subjective:   Casey Warren is a 62 y.o. male here today for a follow up visit for HTN. Patient has No headache, No chest pain, No abdominal pain - No Nausea, No new weakness tingling or numbness, No Cough - shortness of breath. He c/o bilateral knee pain that gives out he has not fallen however he has been able to catch his self. Patient request to wait for ortho appt until insurance was effective.  No problems updated.  Allergies  Allergen Reactions   Citrus Swelling   Chlorthalidone Other (See Comments)    Hyponatremia   Fruit Extracts Swelling    Pt experiences swelling with most fruits    Past Medical History:  Diagnosis Date   Angio-edema    Anxiety    related to surgery   Asthma    GERD (gastroesophageal reflux disease)    History of blood transfusion    HTN (hypertension)    Ulcer     Current Outpatient Medications on File Prior to Visit  Medication Sig Dispense Refill   amLODipine (NORVASC) 10 MG tablet Take 1 tablet (10 mg total) by mouth daily. 90 tablet 3   aspirin EC 81 MG tablet Take 81 mg by mouth daily.     atorvastatin (LIPITOR) 20 MG tablet Take 1 tablet (20 mg total) by mouth daily. 90 tablet 1   EPINEPHrine 0.3 mg/0.3 mL IJ SOAJ injection Inject 0.3 mg into the muscle as needed for anaphylaxis. 1 each 2   escitalopram (LEXAPRO) 20 MG tablet Take 1 tablet (20 mg total) by mouth daily. 90 tablet 1   famotidine (PEPCID) 20 MG tablet Take 1 tablet (20 mg total) by mouth 2 (two) times daily for 7 days. 14 tablet 0   loratadine (CLARITIN) 10 MG tablet Take 1 tablet (10 mg total) by mouth daily for 7 days. 7 tablet 0   No current facility-administered medications on file prior to visit.    Objective:   Vitals:   10/23/22 1002  BP: 127/86  Pulse: 91  Resp: 16  SpO2: 99%   Weight: 151 lb 9.6 oz (68.8 kg)  Height: 5' 3.5" (1.613 m)    Comprehensive ROS Pertinent positive and negative noted in HPI   Exam General appearance : Awake, alert, not in any distress. Speech Clear. Not toxic looking HEENT: Atraumatic and Normocephalic, pupils equally reactive to light and accomodation Neck: Supple, no JVD. No cervical lymphadenopathy.  Chest: Good air entry bilaterally, no added sounds  CVS: S1 S2 regular, no murmurs.  Abdomen: Bowel sounds present, Non tender and not distended with no gaurding, rigidity or rebound. Extremities: B/L Lower Ext shows no edema, both legs are warm to touch Neurology: Awake alert, and oriented X 3, CN II-XII intact, Non focal Skin: No Rash  Data Review Lab Results  Component Value Date   HGBA1C 5.7 (A) 08/22/2021   HGBA1C 5.7 02/22/2021   HGBA1C 5.4 03/14/2020    Assessment & Plan  Casey Warren was seen today for hypertension.  Diagnoses and all orders for this visit:  Essential hypertension Well control BP goal - < 130/80 Explained that having normal blood pressure is the goal and medications are helping to get to goal and maintain normal blood pressure. DIET: Limit salt intake, read nutrition labels to check salt content,  limit fried and high fatty foods  Avoid using multisymptom OTC cold preparations that generally contain sudafed which can rise BP. Consult with pharmacist on best cold relief products to use for persons with HTN EXERCISE Discussed incorporating exercise such as walking - 30 minutes most days of the week and can do in 10 minute intervals     Chronic pain of both knees See HPI  Prediabetes -     POCT glycosylated hemoglobin (Hb A1C)  Colon cancer screening -     Fecal occult blood, imunochemical  Varicella vaccine -     Varicella-zoster vaccine IM    Patient have been counseled extensively about nutrition and exercise. Other issues discussed during this visit include: low cholesterol diet, weight control  and daily exercise, foot care, annual eye examinations at Ophthalmology, importance of adherence with medications and regular follow-up. We also discussed long term complications of uncontrolled diabetes and hypertension.   Follow up vaccine  The patient was given clear instructions to go to ER or return to medical center if symptoms don't improve, worsen or new problems develop. The patient verbalized understanding. The patient was told to call to get lab results if they haven't heard anything in the next week.   This note has been created with Education officer, environmental. Any transcriptional errors are unintentional.   Grayce Sessions, NP 10/23/2022, 10:31 AM

## 2022-12-05 ENCOUNTER — Other Ambulatory Visit: Payer: Self-pay

## 2023-01-07 ENCOUNTER — Other Ambulatory Visit: Payer: Self-pay

## 2023-01-08 ENCOUNTER — Other Ambulatory Visit: Payer: Self-pay

## 2023-01-22 ENCOUNTER — Telehealth (INDEPENDENT_AMBULATORY_CARE_PROVIDER_SITE_OTHER): Payer: Self-pay | Admitting: Primary Care

## 2023-01-22 NOTE — Telephone Encounter (Signed)
Spoke to pt. Will be at apt.  

## 2023-01-23 ENCOUNTER — Ambulatory Visit (INDEPENDENT_AMBULATORY_CARE_PROVIDER_SITE_OTHER): Payer: Medicaid Other | Admitting: Primary Care

## 2023-01-23 ENCOUNTER — Other Ambulatory Visit: Payer: Self-pay

## 2023-01-23 ENCOUNTER — Encounter (INDEPENDENT_AMBULATORY_CARE_PROVIDER_SITE_OTHER): Payer: Self-pay | Admitting: Primary Care

## 2023-01-23 VITALS — BP 140/90 | HR 102 | Resp 16 | Wt 146.2 lb

## 2023-01-23 DIAGNOSIS — I10 Essential (primary) hypertension: Secondary | ICD-10-CM

## 2023-01-23 MED ORDER — VALSARTAN 40 MG PO TABS
40.0000 mg | ORAL_TABLET | Freq: Every day | ORAL | 1 refills | Status: DC
  Filled 2023-01-23: qty 30, 30d supply, fill #0
  Filled 2023-03-05: qty 30, 30d supply, fill #1
  Filled 2023-04-21: qty 30, 30d supply, fill #2

## 2023-01-23 NOTE — Progress Notes (Signed)
[facility]   Casey Warren is a 62 y.o. male presents for hypertension evaluation, Denies shortness of breath, headaches, chest pain or lower extremity edema, sudden onset, vision changes, unilateral weakness, dizziness, paresthesias   Patient reports adherence with medications.  Dietary habits include: tries to monitor sodium intake  Exercise habits include:walking Family / Social history: mother -stroke died at 88   Past Medical History:  Diagnosis Date   Angio-edema    Anxiety    related to surgery   Asthma    GERD (gastroesophageal reflux disease)    History of blood transfusion    HTN (hypertension)    Ulcer    Past Surgical History:  Procedure Laterality Date   CLOSED REDUCTION NASAL FRACTURE  11/19/2011   Procedure: CLOSED REDUCTION NASAL FRACTURE;  Surgeon: Flo Shanks, MD;  Location: [facility] OR;  Service: ENT;  Laterality: N/A;  CLOSED REDUCTION NASAL AND NASAL SEPTAL FRACTURE WITH STABILIZATION   No surgical     Allergies  Allergen Reactions   Citrus Swelling   Chlorthalidone Other (See Comments)    Hyponatremia   Fruit Extracts Swelling    Pt experiences swelling with most fruits   Current Outpatient Medications on File Prior to Visit  Medication Sig Dispense Refill   amLODipine (NORVASC) 10 MG tablet Take 1 tablet (10 mg total) by mouth daily. 90 tablet 3   aspirin EC 81 MG tablet Take 81 mg by mouth daily.     atorvastatin (LIPITOR) 20 MG tablet Take 1 tablet (20 mg total) by mouth daily. 90 tablet 1   EPINEPHrine 0.3 mg/0.3 mL IJ SOAJ injection Inject 0.3 mg into the muscle as needed for anaphylaxis. 1 each 2   escitalopram (LEXAPRO) 20 MG tablet Take 1 tablet (20 mg total) by mouth daily. 90 tablet 1   famotidine (PEPCID) 20 MG tablet Take 1 tablet (20 mg total) by mouth 2 (two) times daily for 7 days. 14 tablet 0   loratadine (CLARITIN) 10 MG tablet Take 1 tablet (10 mg total) by mouth daily for 7 days. 7 tablet 0   No current  facility-administered medications on file prior to visit.   Social History   Socioeconomic History   Marital status: Married    Spouse name: Not on file   Number of children: 3   Years of education: Not on file   Highest education level: Not on file  Occupational History   Not on file  Tobacco Use   Smoking status: Every Day    Current packs/day: 0.50    Types: Cigars, Cigarettes    Passive exposure: Never   Smokeless tobacco: Never   Tobacco comments:    2 black-n-milds   Vaping Use   Vaping status: Never Used  Substance and Sexual Activity   Alcohol use: Yes    Comment: Two cans of beer dialy.    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 Strain: Not on file  Food Insecurity: Not on file  Transportation Needs: Not on file  Physical Activity: Not on file  Stress: Not on file  Social Connections: Not on file  Intimate Partner Violence: Not on file  Family History  Problem Relation Age of Onset   Stroke Mother        Died age 32   Hypertension Mother    Diabetes Mother    Breast cancer Mother    Leukemia Father        Died age 11   Allergic rhinitis Sister    Asthma Grandson      OBJECTIVE:  Vitals:   01/23/23 1027 01/23/23 1028  BP: (Abnormal) 136/90 (Abnormal) 139/93  Pulse: (Abnormal) 102   Resp: 16   SpO2: 97%   Weight: 146 lb 3.2 oz (66.3 kg)     Physical Exam Vitals reviewed.  Constitutional:      Appearance: He is obese.  HENT:     Head: Normocephalic.     Right Ear: Tympanic membrane and external ear normal.     Left Ear: Tympanic membrane and external ear normal.     Nose: Nose normal.  Eyes:     Extraocular Movements: Extraocular movements intact.   Cardiovascular:     Rate and Rhythm: Normal rate and regular rhythm.  Pulmonary:     Effort: Pulmonary effort is normal.     Breath sounds: Normal breath sounds.  Abdominal:     General: Bowel sounds are normal.     Palpations: Abdomen is soft.  Musculoskeletal:        General: Normal range of motion.     Cervical back: Normal range of motion.  Skin:    General: Skin is warm and dry.  Neurological:     Mental Status: He is alert and oriented to person, place, and time.  Psychiatric:        Mood and Affect: Mood normal.        Behavior: Behavior normal.    ROS  Last 3 Office BP readings: BP Readings from Last 3 Encounters:  01/23/23 (Abnormal) 139/93  10/23/22 127/86  08/15/22 122/80    BMET    Component Value Date/Time   NA 131 (L) 07/02/2022 0330   NA 134 02/22/2021 1009   K 3.9 07/02/2022 0330   CL 99 07/02/2022 0330   CO2 21 (L) 07/02/2022 0330   GLUCOSE 203 (H) 07/02/2022 0330   BUN 5 (L) 07/02/2022 0330   BUN 7 (L) 02/22/2021 1009   CREATININE 1.01 07/02/2022 0330   CALCIUM 9.1 07/02/2022 0330   GFRNONAA >60 07/02/2022 0330   GFRAA 105 12/16/2019 1106    Renal function: CrCl cannot be calculated (Patient's most recent lab result is older than the maximum 21 days allowed.).  Clinical ASCVD: Yes  The 10-year ASCVD risk score (Arnett DK, et al., 2019) is: 42.8%   Values used to calculate the score:     Age: 37 years     Sex: Male     Is Non-Hispanic African American: Yes     Diabetic: Yes     Tobacco smoker: Yes     Systolic Blood Pressure: 139 mmHg     Is BP treated: Yes     HDL Cholesterol: 56 mg/dL     Total Cholesterol: 134 mg/dL  ASCVD risk factors include- Casey Warren   ASSESSMENT & PLAN: Casey Warren was seen today for hypertension.  Diagnoses and all orders for this visit:  Essential hypertension -Counseled on lifestyle modifications for blood pressure control including reduced dietary sodium, increased exercise, weight reduction and adequate  sleep. Also, educated patient about the risk for cardiovascular events, stroke and heart attack. Also counseled patient about the importance of medication  adherence. If you participate in smoking, it is important to stop using tobacco as this will increase the risks associated with uncontrolled blood pressure.   Goal BP:  For patients younger than 60: Goal BP < 130/80. For patients 60 and older: Goal BP < 140/90. For patients with diabetes: Goal BP < 130/80. Your most recent BP: 140/90  Minimize salt intake. Minimize alcohol intake  -     CMP14+EGFR  Other orders -     valsartan (DIOVAN) 40 MG tablet; Take 1 tablet (40 mg total) by mouth daily.   This note has been created with Education officer, environmental. Any transcriptional errors are unintentional.   Grayce Sessions, NP 01/23/2023, 10:41 AM

## 2023-01-24 LAB — CMP14+EGFR
ALT: 22 IU/L (ref 0–44)
AST: 34 IU/L (ref 0–40)
Albumin: 4.4 g/dL (ref 3.9–4.9)
Alkaline Phosphatase: 97 IU/L (ref 44–121)
BUN/Creatinine Ratio: 8 — ABNORMAL LOW (ref 10–24)
BUN: 7 mg/dL — ABNORMAL LOW (ref 8–27)
Bilirubin Total: 0.5 mg/dL (ref 0.0–1.2)
CO2: 19 mmol/L — ABNORMAL LOW (ref 20–29)
Calcium: 9.5 mg/dL (ref 8.6–10.2)
Chloride: 100 mmol/L (ref 96–106)
Creatinine, Ser: 0.93 mg/dL (ref 0.76–1.27)
Globulin, Total: 2.8 g/dL (ref 1.5–4.5)
Glucose: 60 mg/dL — ABNORMAL LOW (ref 70–99)
Potassium: 5 mmol/L (ref 3.5–5.2)
Sodium: 138 mmol/L (ref 134–144)
Total Protein: 7.2 g/dL (ref 6.0–8.5)
eGFR: 92 mL/min/{1.73_m2} (ref >59)

## 2023-01-28 ENCOUNTER — Other Ambulatory Visit: Payer: Self-pay

## 2023-02-11 ENCOUNTER — Other Ambulatory Visit (INDEPENDENT_AMBULATORY_CARE_PROVIDER_SITE_OTHER): Payer: Self-pay

## 2023-02-11 ENCOUNTER — Other Ambulatory Visit: Payer: Self-pay

## 2023-02-11 ENCOUNTER — Other Ambulatory Visit (INDEPENDENT_AMBULATORY_CARE_PROVIDER_SITE_OTHER): Payer: Self-pay | Admitting: Primary Care

## 2023-02-11 DIAGNOSIS — Z760 Encounter for issue of repeat prescription: Secondary | ICD-10-CM

## 2023-02-11 DIAGNOSIS — E782 Mixed hyperlipidemia: Secondary | ICD-10-CM

## 2023-02-11 MED ORDER — ATORVASTATIN CALCIUM 20 MG PO TABS
20.0000 mg | ORAL_TABLET | Freq: Every day | ORAL | 0 refills | Status: DC
  Filled 2023-02-11: qty 30, 30d supply, fill #0

## 2023-02-12 ENCOUNTER — Other Ambulatory Visit: Payer: Self-pay

## 2023-02-12 ENCOUNTER — Other Ambulatory Visit (HOSPITAL_COMMUNITY): Payer: Self-pay

## 2023-02-13 ENCOUNTER — Other Ambulatory Visit: Payer: Self-pay

## 2023-02-13 ENCOUNTER — Encounter (INDEPENDENT_AMBULATORY_CARE_PROVIDER_SITE_OTHER): Payer: Self-pay | Admitting: Primary Care

## 2023-02-13 ENCOUNTER — Ambulatory Visit (INDEPENDENT_AMBULATORY_CARE_PROVIDER_SITE_OTHER): Payer: Medicaid Other | Admitting: Primary Care

## 2023-02-13 VITALS — BP 130/87 | HR 61 | Resp 16 | Wt 150.6 lb

## 2023-02-13 DIAGNOSIS — R051 Acute cough: Secondary | ICD-10-CM

## 2023-02-13 DIAGNOSIS — F17200 Nicotine dependence, unspecified, uncomplicated: Secondary | ICD-10-CM

## 2023-02-13 DIAGNOSIS — Z760 Encounter for issue of repeat prescription: Secondary | ICD-10-CM

## 2023-02-13 DIAGNOSIS — F418 Other specified anxiety disorders: Secondary | ICD-10-CM | POA: Diagnosis not present

## 2023-02-13 DIAGNOSIS — L602 Onychogryphosis: Secondary | ICD-10-CM

## 2023-02-13 DIAGNOSIS — Z823 Family history of stroke: Secondary | ICD-10-CM

## 2023-02-13 DIAGNOSIS — R600 Localized edema: Secondary | ICD-10-CM | POA: Diagnosis not present

## 2023-02-13 DIAGNOSIS — E782 Mixed hyperlipidemia: Secondary | ICD-10-CM

## 2023-02-13 MED ORDER — LORATADINE 10 MG PO TABS
10.0000 mg | ORAL_TABLET | Freq: Every day | ORAL | 1 refills | Status: DC | PRN
  Filled 2023-02-13: qty 30, 30d supply, fill #0
  Filled 2023-02-13: qty 100, 100d supply, fill #0
  Filled 2023-03-20: qty 30, 30d supply, fill #1
  Filled 2023-05-01: qty 30, 30d supply, fill #2

## 2023-02-13 MED ORDER — ALBUTEROL SULFATE HFA 108 (90 BASE) MCG/ACT IN AERS
2.0000 | INHALATION_SPRAY | Freq: Four times a day (QID) | RESPIRATORY_TRACT | 2 refills | Status: AC | PRN
  Filled 2023-02-13 (×2): qty 18, 25d supply, fill #0

## 2023-02-13 MED ORDER — GUAIFENESIN 200 MG PO TABS
200.0000 mg | ORAL_TABLET | ORAL | 1 refills | Status: DC | PRN
  Filled 2023-02-13: qty 30, 5d supply, fill #0

## 2023-02-13 MED ORDER — ESCITALOPRAM OXALATE 20 MG PO TABS
20.0000 mg | ORAL_TABLET | Freq: Every day | ORAL | 1 refills | Status: DC
  Filled 2023-02-13: qty 90, 90d supply, fill #0
  Filled 2023-03-20: qty 30, 30d supply, fill #0
  Filled 2023-05-01: qty 30, 30d supply, fill #1

## 2023-02-13 NOTE — Progress Notes (Signed)
[facility]  Casey Warren, is a 62 y.o. male  [id number]  [id number]  DOB - Apr 22, 1961  Chief Complaint  Patient presents with   Hypertension       Subjective:   Casey Warren is a 62 y.o. male here today for a follow up visit for HTN,.  Patient's sister is present Casey Warren and he has given permission for her to be at his appointment and also provide additional information.  Blood pressure has been improving and he is taking his medication daily.  Patient has No headache, No chest pain, No abdominal pain - No Nausea, No new weakness tingling or numbness, No Cough - shortness of breath. Asked about depression and was the medication still effective yes and his sister also agree.  There is 1 concern that was brought up he  He is left ankle is edematous.  +2 denies any pain. No problems updated.  Allergies  Allergen Reactions   Citrus Swelling   Chlorthalidone Other (See Comments)    Hyponatremia   Fruit Extracts Swelling    Pt experiences swelling with most fruits    Past Medical History:  Diagnosis Date   Angio-edema    Anxiety    related to surgery   Asthma    GERD (gastroesophageal reflux disease)    History of blood transfusion    HTN (hypertension)    Ulcer     Current Outpatient Medications on File Prior to Visit  Medication Sig Dispense Refill   amLODipine (NORVASC) 10 MG tablet Take 1 tablet (10 mg total) by mouth daily. 90 tablet 3   aspirin EC 81 MG tablet Take 81 mg by mouth daily.     atorvastatin (LIPITOR) 20 MG tablet Take 1 tablet (20 mg total) by mouth daily. 30 tablet 0   EPINEPHrine 0.3 mg/0.3 mL IJ SOAJ injection Inject 0.3 mg into the muscle as needed for anaphylaxis. 1 each 2   famotidine (PEPCID) 20 MG tablet Take 1 tablet (20 mg total) by mouth 2 (two) times daily for 7 days. 14 tablet 0   valsartan (DIOVAN) 40 MG tablet Take 1 tablet (40 mg total) by mouth daily. 90 tablet 1   No current facility-administered medications on  file prior to visit.    Objective:   Vitals:   02/13/23 0905 02/13/23 0916  BP: 133/89 130/87  Pulse:  61  Resp: 16   SpO2: 99%   Weight: 150 lb 9.6 oz (68.3 kg)     Comprehensive ROS Pertinent positive and negative noted in HPI   Exam General appearance : Awake, alert, not in any distress. Speech Clear. Not toxic looking HEENT: Atraumatic and Normocephalic, pupils equally reactive to light and accomodation Neck: Supple, no JVD. No cervical lymphadenopathy.  Chest: Good air entry bilaterally, no added sounds  CVS: S1 S2 regular, no murmurs.  Abdomen: Bowel sounds present, Non tender and not distended with no gaurding, rigidity or rebound. Extremities: B/L Lower Ext shows 2+edema, both legs are warm to touch Neurology: Awake alert, and oriented X 3,  Non focal Skin: No Rash  Data Review Lab Results  Component Value Date   HGBA1C 5.3 (A) 10/23/2022   HGBA1C 5.7 (A) 08/22/2021   HGBA1C 5.7 02/22/2021    Assessment & Plan   Levontae was seen today for hypertension.  Diagnoses and all orders for this visit:  Mixed hyperlipidemia On statin  -     Lipid Panel  Medication refill -     escitalopram (LEXAPRO) 20 MG  tablet; Take 1 tablet (20 mg total) by mouth daily.  Anxiety with depression Working well no change -     escitalopram (LEXAPRO) 20 MG tablet; Take 1 tablet (20 mg total) by mouth daily.  Onychauxis -     Ambulatory referral to Podiatry  Edema of left foot Please keep leg elevated when resting. Using compression hose is highly recommended and should be worn within 1 hour of waking from bed. Avoid sitting for prolonged time. Recliner will not help. Try to lay your legs flat on a sofa or on a bed. Avoid excess salty food. Exercise regularly.  Other orders -     loratadine (CLARITIN) 10 MG tablet; Take 1 tablet (10 mg total) by mouth daily as needed for allergies.     Patient have been counseled extensively about nutrition and exercise. Other issues discussed  during this visit include: low cholesterol diet, weight control and daily exercise, foot care, annual eye examinations at Ophthalmology, importance of adherence with medications and regular follow-up. We also discussed long term complications of uncontrolled diabetes and hypertension.   Return in about 3 months (around 05/15/2023).  The patient was given clear instructions to go to ER or return to medical center if symptoms don't improve, worsen or new problems develop. The patient verbalized understanding. The patient was told to call to get lab results if they haven't heard anything in the next week.   This note has been created with Education officer, environmental. Any transcriptional errors are unintentional.   Grayce Sessions, NP 02/17/2023, 5:54 PM

## 2023-02-13 NOTE — Patient Instructions (Signed)
Please keep leg elevated when resting. Using compression hose is highly recommended and should be worn within 1 hour of waking from bed. Avoid sitting for prolonged time. Recliner will not help. Try to lay your legs flat on a sofa or on a bed. Avoid excess salty food. Exercise regularly. ? ?

## 2023-02-14 ENCOUNTER — Other Ambulatory Visit: Payer: Self-pay

## 2023-02-14 LAB — LIPID PANEL
Chol/HDL Ratio: 2 ratio (ref 0.0–5.0)
Cholesterol, Total: 125 mg/dL (ref 100–199)
HDL: 63 mg/dL (ref >39)
LDL Chol Calc (NIH): 44 mg/dL (ref 0–99)
Triglycerides: 94 mg/dL (ref 0–149)
VLDL Cholesterol Cal: 18 mg/dL (ref 5–40)

## 2023-02-24 ENCOUNTER — Ambulatory Visit: Payer: BLUE CROSS/BLUE SHIELD | Admitting: Podiatry

## 2023-03-10 ENCOUNTER — Emergency Department (HOSPITAL_COMMUNITY): Payer: Medicaid Other

## 2023-03-10 ENCOUNTER — Emergency Department (HOSPITAL_COMMUNITY)
Admission: EM | Admit: 2023-03-10 | Discharge: 2023-03-10 | Disposition: A | Discharge status: Home / Self Care | Payer: Medicaid Other | Attending: Emergency Medicine | Admitting: Emergency Medicine

## 2023-03-10 ENCOUNTER — Encounter (HOSPITAL_COMMUNITY): Payer: Self-pay

## 2023-03-10 ENCOUNTER — Other Ambulatory Visit: Payer: Self-pay

## 2023-03-10 DIAGNOSIS — I6782 Cerebral ischemia: Secondary | ICD-10-CM | POA: Diagnosis not present

## 2023-03-10 DIAGNOSIS — Z7982 Long term (current) use of aspirin: Secondary | ICD-10-CM | POA: Diagnosis not present

## 2023-03-10 DIAGNOSIS — G9389 Other specified disorders of brain: Secondary | ICD-10-CM | POA: Diagnosis not present

## 2023-03-10 DIAGNOSIS — Y902 Blood alcohol level of 40-59 mg/100 ml: Secondary | ICD-10-CM | POA: Insufficient documentation

## 2023-03-10 DIAGNOSIS — R42 Dizziness and giddiness: Secondary | ICD-10-CM | POA: Insufficient documentation

## 2023-03-10 DIAGNOSIS — R780 Finding of alcohol in blood: Secondary | ICD-10-CM | POA: Diagnosis not present

## 2023-03-10 LAB — COMPREHENSIVE METABOLIC PANEL
ALT: 18 U/L (ref 0–44)
AST: 23 U/L (ref 15–41)
Albumin: 3.2 g/dL — ABNORMAL LOW (ref 3.5–5.0)
Alkaline Phosphatase: 76 U/L (ref 38–126)
Anion gap: 11 (ref 5–15)
BUN: 6 mg/dL — ABNORMAL LOW (ref 8–23)
CO2: 21 mmol/L — ABNORMAL LOW (ref 22–32)
Calcium: 8.4 mg/dL — ABNORMAL LOW (ref 8.9–10.3)
Chloride: 107 mmol/L (ref 98–111)
Creatinine, Ser: 0.98 mg/dL (ref 0.61–1.24)
GFR, Estimated: >60 mL/min (ref >60)
Glucose, Bld: 94 mg/dL (ref 70–99)
Potassium: 4 mmol/L (ref 3.5–5.1)
Sodium: 139 mmol/L (ref 135–145)
Total Bilirubin: 0.5 mg/dL (ref 0.3–1.2)
Total Protein: 6.3 g/dL — ABNORMAL LOW (ref 6.5–8.1)

## 2023-03-10 LAB — ETHANOL: Alcohol, Ethyl (B): 49 mg/dL — ABNORMAL HIGH (ref <10)

## 2023-03-10 LAB — CBC
HCT: 42.1 % (ref 39.0–52.0)
Hemoglobin: 14.8 g/dL (ref 13.0–17.0)
MCH: 31.7 pg (ref 26.0–34.0)
MCHC: 35.2 g/dL (ref 30.0–36.0)
MCV: 90.1 fL (ref 80.0–100.0)
Platelets: 298 10*3/uL (ref 150–400)
RBC: 4.67 MIL/uL (ref 4.22–5.81)
RDW: 13.8 % (ref 11.5–15.5)
WBC: 4.7 10*3/uL (ref 4.0–10.5)
nRBC: 0 % (ref 0.0–0.2)

## 2023-03-10 LAB — DIFFERENTIAL
Abs Immature Granulocytes: 0.01 10*3/uL (ref 0.00–0.07)
Basophils Absolute: 0 10*3/uL (ref 0.0–0.1)
Basophils Relative: 1 %
Eosinophils Absolute: 0.1 10*3/uL (ref 0.0–0.5)
Eosinophils Relative: 3 %
Immature Granulocytes: 0 %
Lymphocytes Relative: 19 %
Lymphs Abs: 0.9 10*3/uL (ref 0.7–4.0)
Monocytes Absolute: 0.4 10*3/uL (ref 0.1–1.0)
Monocytes Relative: 8 %
Neutro Abs: 3.2 10*3/uL (ref 1.7–7.7)
Neutrophils Relative %: 69 %

## 2023-03-10 LAB — CBG MONITORING, ED: Glucose-Capillary: 96 mg/dL (ref 70–99)

## 2023-03-10 LAB — RAPID URINE DRUG SCREEN, HOSP PERFORMED
Amphetamines: NOT DETECTED
Barbiturates: NOT DETECTED
Benzodiazepines: NOT DETECTED
Cocaine: NOT DETECTED
Opiates: NOT DETECTED
Tetrahydrocannabinol: NOT DETECTED

## 2023-03-10 LAB — PROTIME-INR
INR: 1 (ref 0.8–1.2)
Prothrombin Time: 13.6 s (ref 11.4–15.2)

## 2023-03-10 LAB — APTT: aPTT: 29 s (ref 24–36)

## 2023-03-10 MED ORDER — SODIUM CHLORIDE 0.9% FLUSH
3.0000 mL | Freq: Once | INTRAVENOUS | Status: AC
  Administered 2023-03-10: 3 mL via INTRAVENOUS

## 2023-03-10 MED ORDER — SODIUM CHLORIDE 0.9 % IV BOLUS
1000.0000 mL | Freq: Once | INTRAVENOUS | Status: AC
  Administered 2023-03-10: 1000 mL via INTRAVENOUS

## 2023-03-10 NOTE — ED Notes (Signed)
Patient transported to CT 

## 2023-03-10 NOTE — ED Triage Notes (Signed)
Patient arrived from home with complaint of dizziness that started this afternoon. Patient endorses daily alcohol use. Patient thinks symptoms started around 2pm. No extremity weakness, no drift, no slurred speech, alert and oriented. Reports some posterior neck pain with same, denies trauma. EMS reports that patient positive for orthostatics and administered 200 NS prior to arrival

## 2023-03-10 NOTE — ED Provider Notes (Signed)
[place] EMERGENCY DEPARTMENT AT [facility] Provider Note   CSN: [id number] Arrival date & time: 03/10/23  1601     History {Add pertinent medical, surgical, social history, OB history to HPI:1} Chief Complaint  Patient presents with   Dizziness    Casey Warren is a 62 y.o. male.  He has a history of seizures alcohol and substance use peptic ulcer disease.  He said he was sitting on the porch with family when he began feeling nauseous and had vomiting.  Lightheaded and dizzy. he said his family told him that he had a seizure.  He said he had a seizure before but he does not take any medicines for it.  He now just feels really tired.  He denies any headache numbness or weakness.  No chest pain or shortness of breath.  He does endorse a few beers today, not unusual for him.   Dizziness Quality:  Lightheadedness Onset quality:  Unable to specify Timing:  Unable to specify Progression:  Improving Chronicity:  Recurrent Relieved by:  None tried Worsened by:  Nothing Ineffective treatments:  None tried Associated symptoms: nausea and vomiting   Associated symptoms: no chest pain, no headaches and no shortness of breath        Home Medications Prior to Admission medications   Medication Sig Start Date End Date Taking? Authorizing Provider  albuterol (VENTOLIN HFA) 108 (90 Base) MCG/ACT inhaler Inhale 2 puffs into the lungs every 6 (six) hours as needed for wheezing or shortness of breath. 02/13/23   Grayce Sessions, NP  amLODipine (NORVASC) 10 MG tablet Take 1 tablet (10 mg total) by mouth daily. 07/19/22   Grayce Sessions, NP  aspirin EC 81 MG tablet Take 81 mg by mouth daily.    [provider]  atorvastatin (LIPITOR) 20 MG tablet Take 1 tablet (20 mg total) by mouth daily. 02/11/23   Grayce Sessions, NP  EPINEPHrine 0.3 mg/0.3 mL IJ SOAJ injection Inject 0.3 mg into the muscle as needed for anaphylaxis. 08/15/22   Alfonse Spruce, MD   escitalopram (LEXAPRO) 20 MG tablet Take 1 tablet (20 mg total) by mouth daily. 02/13/23   Grayce Sessions, NP  famotidine (PEPCID) 20 MG tablet Take 1 tablet (20 mg total) by mouth 2 (two) times daily for 7 days. 07/02/22 07/09/22  Glade Lloyd, MD  guaiFENesin 200 MG tablet Take 1 tablet (200 mg total) by mouth every 4 (four) hours as needed for cough or to loosen phlegm. 02/13/23   Grayce Sessions, NP  loratadine (CLARITIN) 10 MG tablet Take 1 tablet (10 mg total) by mouth daily as needed for allergies. 02/13/23   Grayce Sessions, NP  valsartan (DIOVAN) 40 MG tablet Take 1 tablet (40 mg total) by mouth daily. 01/23/23   Grayce Sessions, NP      Allergies    Citrus, Chlorthalidone, and Fruit extracts    Review of Systems   Review of Systems  Constitutional:  Negative for fever.  Eyes:  Negative for visual disturbance.  Respiratory:  Negative for shortness of breath.   Cardiovascular:  Negative for chest pain.  Gastrointestinal:  Positive for nausea and vomiting.  Neurological:  Positive for dizziness, seizures (??) and light-headedness. Negative for headaches.    Physical Exam Updated Vital Signs BP 120/88 (BP Location: Left Arm)   Pulse 96   Temp 97.8 F (36.6 C) (Oral)   Resp 17   SpO2 100%  Physical Exam Vitals  and nursing note reviewed.  Constitutional:      General: He is not in acute distress.    Appearance: Normal appearance. He is well-developed.  HENT:     Head: Normocephalic and atraumatic.  Eyes:     Conjunctiva/sclera: Conjunctivae normal.  Cardiovascular:     Rate and Rhythm: Normal rate and regular rhythm.     Heart sounds: No murmur heard. Pulmonary:     Effort: Pulmonary effort is normal. No respiratory distress.     Breath sounds: Normal breath sounds.  Abdominal:     Palpations: Abdomen is soft.     Tenderness: There is no abdominal tenderness. There is no guarding or rebound.  Musculoskeletal:        General: No swelling.     Cervical  back: Neck supple.  Skin:    General: Skin is warm and dry.     Capillary Refill: Capillary refill takes less than 2 seconds.  Neurological:     General: No focal deficit present.     Mental Status: He is alert and oriented to person, place, and time.     Cranial Nerves: No cranial nerve deficit.     Sensory: No sensory deficit.     Motor: No weakness.     ED Results / Procedures / Treatments   Labs (all labs ordered are listed, but only abnormal results are displayed) Labs Reviewed  PROTIME-INR  APTT  CBC  DIFFERENTIAL  COMPREHENSIVE METABOLIC PANEL  ETHANOL  CBG MONITORING, ED    EKG EKG Interpretation Date/Time:  Monday March 10 2023 16:16:00 EDT Ventricular Rate:  71 PR Interval:  212 QRS Duration:  82 QT Interval:  408 QTC Calculation: 443 R Axis:   70  Text Interpretation: Sinus rhythm with 1st degree A-V block Septal infarct , age undetermined Abnormal ECG When compared with ECG of 13-Jan-2017 20:51, No significant change since last tracing Confirmed by Meridee Score [phone]) on 03/10/2023 4:17:59 PM  Radiology No results found.  Procedures Procedures  {Document cardiac monitor, telemetry assessment procedure when appropriate:1}  Medications Ordered in ED Medications  sodium chloride flush (NS) 0.9 % injection 3 mL (has no administration in time range)    ED Course/ Medical Decision Making/ A&P   {   Click here for ABCD2, HEART and other calculatorsREFRESH Note before signing :1}                              Medical Decision Making Amount and/or Complexity of Data Reviewed Labs: ordered. Radiology: ordered.   This patient complains of ***; this involves an extensive number of treatment Options and is a complaint that carries with it a high risk of complications and morbidity. The differential includes ***  I ordered, reviewed and interpreted labs, which included *** I ordered medication *** and reviewed PMP when indicated. I ordered  imaging studies which included *** and I independently    visualized and interpreted imaging which showed *** Additional history obtained from *** Previous records obtained and reviewed *** I consulted *** and discussed lab and imaging findings and discussed disposition.  Cardiac monitoring reviewed, *** Social determinants considered, *** Critical Interventions: ***  After the interventions stated above, I reevaluated the patient and found *** Admission and further testing considered, ***   {Document critical care time when appropriate:1} {Document review of labs and clinical decision tools ie heart score, Chads2Vasc2 etc:1}  {Document your independent review of radiology images, and  any outside records:1} {Document your discussion with family members, caretakers, and with consultants:1} {Document social determinants of health affecting pt's care:1} {Document your decision making why or why not admission, treatments were needed:1} Final Clinical Impression(s) / ED Diagnoses Final diagnoses:  None    Rx / DC Orders ED Discharge Orders     None

## 2023-03-12 ENCOUNTER — Ambulatory Visit (INDEPENDENT_AMBULATORY_CARE_PROVIDER_SITE_OTHER): Payer: Self-pay | Admitting: *Deleted

## 2023-03-12 ENCOUNTER — Other Ambulatory Visit: Payer: Self-pay

## 2023-03-12 NOTE — Telephone Encounter (Signed)
  Chief Complaint: dizziness per patient daughter not with patient now  Symptoms: dizziness, sleeping a lot since seizures Monday in ED.  Frequency: today  Pertinent Negatives: Patient denies chest pain no difficulty breathing no other sx reported  Disposition: [] ED /[] Urgent Care (no appt availability in office) / [] Appointment(In office/virtual)/ []  [facility]/ [] Home Care/ [] Refused Recommended Disposition /[] [facility]/ [x]  Follow-up with PCP Additional Notes:   Recommended to assess patient when she goes to see him and call back if sx worsening or go to ED. Reports last seen in ED Monday and told he had sz , mini strokes. Daughter reports patient probably not drinking enough water. Encouraged to push fluids. Please advise if earlier appt needed than hospital f/u 03/27/23.      Reason for Disposition  [1] MODERATE dizziness (e.g., interferes with normal activities) AND [2] has been evaluated by doctor (or NP/PA) for this  Answer Assessment - Initial Assessment Questions 1. DESCRIPTION: "Describe your dizziness."     Feeling very tired. Sleeping a lot since Monday after seen in ED per patient daughter on DPR 2. LIGHTHEADED: "Do you feel lightheaded?" (e.g., somewhat faint, woozy, weak upon standing)     Feels very tired walking  3. VERTIGO: "Do you feel like either you or the room is spinning or tilting?" (i.e. vertigo)     na 4. SEVERITY: "How bad is it?"  "Do you feel like you are going to faint?" "Can you stand and walk?"   - MILD: Feels slightly dizzy, but walking normally.   - MODERATE: Feels unsteady when walking, but not falling; interferes with normal activities (e.g., school, work).   - SEVERE: Unable to walk without falling, or requires assistance to walk without falling; feels like passing out now.      Feels dizzy and laying down 5. ONSET:  "When did the dizziness begin?"     today 6. AGGRAVATING FACTORS: "Does anything make it worse?" (e.g.,  standing, change in head position)     Na  7. HEART RATE: "Can you tell me your heart rate?" "How many beats in 15 seconds?"  (Note: not all patients can do this)       na 8. CAUSE: "What do you think is causing the dizziness?"     Not sure probably not drinking enough water  9. RECURRENT SYMPTOM: "Have you had dizziness before?" If Yes, ask: "When was the last time?" "What happened that time?"     Na  10. OTHER SYMPTOMS: "Do you have any other symptoms?" (e.g., fever, chest pain, vomiting, diarrhea, bleeding)       dizziness 11. PREGNANCY: "Is there any chance you are pregnant?" "When was your last menstrual period?"       na  Protocols used: Dizziness - Lightheadedness-A-AH

## 2023-03-13 ENCOUNTER — Ambulatory Visit: Payer: Medicaid Other | Admitting: Podiatry

## 2023-03-13 DIAGNOSIS — M79675 Pain in left toe(s): Secondary | ICD-10-CM

## 2023-03-13 DIAGNOSIS — M79674 Pain in right toe(s): Secondary | ICD-10-CM | POA: Diagnosis not present

## 2023-03-13 DIAGNOSIS — B351 Tinea unguium: Secondary | ICD-10-CM

## 2023-03-13 NOTE — Telephone Encounter (Signed)
FYI

## 2023-03-13 NOTE — Progress Notes (Signed)
  Subjective:  Patient ID: Casey Warren, male    DOB: Apr 26, 1961,  MRN: [id number]  Chief Complaint  Patient presents with   Nail Problem    Thickness and discoloration of nails bilateral feet. Patient is not diabetic. Has never been treated for nail fungus.   Nail trim     62 y.o. male presents with the above complaint. History confirmed with patient. Patient presenting with pain related to dystrophic thickened elongated nails. Patient is unable to trim own nails related to nail dystrophy and/or mobility issues. Patient does not have a history of T2DM.   Objective:  Physical Exam: warm, good capillary refill nail exam onychomycosis of the toenails, onycholysis, and dystrophic nails DP pulses palpable, PT pulses palpable, and protective sensation intact Left Foot:  Pain with palpation of nails due to elongation and dystrophic growth.  Right Foot: Pain with palpation of nails due to elongation and dystrophic growth.   Assessment:   1. Pain due to onychomycosis of toenails of both feet      Plan:  Patient was evaluated and treated and all questions answered.   #Onychomycosis with pain  -Nails palliatively debrided as below. -Educated on self-care  Procedure: Nail Debridement Rationale: Pain Type of Debridement: manual, sharp debridement. Instrumentation: Nail nipper, rotary burr. Number of Nails: 10  Return in about 3 months (around 06/13/2023) for RFC.         Corinna Gab, DPM [facility] / [facility]

## 2023-03-20 ENCOUNTER — Other Ambulatory Visit (INDEPENDENT_AMBULATORY_CARE_PROVIDER_SITE_OTHER): Payer: Self-pay | Admitting: Primary Care

## 2023-03-20 ENCOUNTER — Other Ambulatory Visit: Payer: Self-pay

## 2023-03-20 DIAGNOSIS — J45909 Unspecified asthma, uncomplicated: Secondary | ICD-10-CM | POA: Diagnosis not present

## 2023-03-20 DIAGNOSIS — Z823 Family history of stroke: Secondary | ICD-10-CM | POA: Diagnosis not present

## 2023-03-20 DIAGNOSIS — F411 Generalized anxiety disorder: Secondary | ICD-10-CM | POA: Diagnosis not present

## 2023-03-20 DIAGNOSIS — E785 Hyperlipidemia, unspecified: Secondary | ICD-10-CM | POA: Diagnosis not present

## 2023-03-20 DIAGNOSIS — Z008 Encounter for other general examination: Secondary | ICD-10-CM | POA: Diagnosis not present

## 2023-03-20 DIAGNOSIS — Z818 Family history of other mental and behavioral disorders: Secondary | ICD-10-CM | POA: Diagnosis not present

## 2023-03-20 DIAGNOSIS — E782 Mixed hyperlipidemia: Secondary | ICD-10-CM

## 2023-03-20 DIAGNOSIS — Z8249 Family history of ischemic heart disease and other diseases of the circulatory system: Secondary | ICD-10-CM | POA: Diagnosis not present

## 2023-03-20 DIAGNOSIS — G40909 Epilepsy, unspecified, not intractable, without status epilepticus: Secondary | ICD-10-CM | POA: Diagnosis not present

## 2023-03-20 DIAGNOSIS — Z760 Encounter for issue of repeat prescription: Secondary | ICD-10-CM

## 2023-03-20 DIAGNOSIS — Z720 Tobacco use: Secondary | ICD-10-CM | POA: Diagnosis not present

## 2023-03-20 DIAGNOSIS — I10 Essential (primary) hypertension: Secondary | ICD-10-CM | POA: Diagnosis not present

## 2023-03-20 DIAGNOSIS — Z833 Family history of diabetes mellitus: Secondary | ICD-10-CM | POA: Diagnosis not present

## 2023-03-20 MED ORDER — ATORVASTATIN CALCIUM 20 MG PO TABS
20.0000 mg | ORAL_TABLET | Freq: Every day | ORAL | 0 refills | Status: DC
Start: 2023-03-20 — End: 2023-06-06
  Filled 2023-03-20: qty 30, 30d supply, fill #0
  Filled 2023-05-07: qty 30, 30d supply, fill #1

## 2023-03-20 NOTE — Telephone Encounter (Signed)
Requested Prescriptions  Pending Prescriptions Disp Refills   atorvastatin (LIPITOR) 20 MG tablet 90 tablet 0    Sig: Take 1 tablet (20 mg total) by mouth daily.     Cardiovascular:  Antilipid - Statins Failed - 03/20/2023 12:02 PM      Failed - Lipid Panel in normal range within the last 12 months    Cholesterol, Total  Date Value Ref Range Status  02/13/2023 125 100 - 199 mg/dL Final   LDL Chol Calc (NIH)  Date Value Ref Range Status  02/13/2023 44 0 - 99 mg/dL Final   HDL  Date Value Ref Range Status  02/13/2023 63 >39 mg/dL Final   Triglycerides  Date Value Ref Range Status  02/13/2023 94 0 - 149 mg/dL Final         Passed - Patient is not pregnant      Passed - Valid encounter within last 12 months    Recent Outpatient Visits           1 month ago Medication refill   [place] [facility] Grayce Sessions, NP   1 month ago Essential hypertension   [place] [facility] Grayce Sessions, NP   4 months ago Essential hypertension   [place] [facility] Grayce Sessions, NP   8 months ago Hospital discharge follow-up   [place] [facility] Grayce Sessions, NP   1 year ago Essential hypertension   [place] [facility] Grayce Sessions, NP       Future Appointments             In 1 week Randa Evens Kinnie Scales, NP [place] [facility]   In 1 month Randa Evens, Kinnie Scales, NP  [facility]

## 2023-03-25 ENCOUNTER — Other Ambulatory Visit: Payer: Self-pay

## 2023-03-27 ENCOUNTER — Ambulatory Visit (INDEPENDENT_AMBULATORY_CARE_PROVIDER_SITE_OTHER): Payer: Medicaid Other | Admitting: Primary Care

## 2023-03-27 ENCOUNTER — Encounter (INDEPENDENT_AMBULATORY_CARE_PROVIDER_SITE_OTHER): Payer: Self-pay | Admitting: Primary Care

## 2023-03-27 VITALS — BP 133/89 | HR 95 | Resp 16 | Wt 153.2 lb

## 2023-03-27 DIAGNOSIS — F17200 Nicotine dependence, unspecified, uncomplicated: Secondary | ICD-10-CM | POA: Diagnosis not present

## 2023-03-27 DIAGNOSIS — R569 Unspecified convulsions: Secondary | ICD-10-CM | POA: Diagnosis not present

## 2023-03-27 DIAGNOSIS — Z09 Encounter for follow-up examination after completed treatment for conditions other than malignant neoplasm: Secondary | ICD-10-CM

## 2023-03-27 DIAGNOSIS — Z1211 Encounter for screening for malignant neoplasm of colon: Secondary | ICD-10-CM

## 2023-03-27 DIAGNOSIS — F1010 Alcohol abuse, uncomplicated: Secondary | ICD-10-CM

## 2023-03-27 NOTE — Progress Notes (Signed)
[facility]   Subjective:   Casey Warren is a 63 y.o. male presents for ED  follow up . 03/10/23 presented for Lightheaded and dizzy. His family told him that he had a seizure. He denies any headache numbness or weakness. No chest pain or shortness of breath. He does endorse he had 2  beersyesterday and a black and mild. No appetite has not had food today. Past Medical History:  Diagnosis Date   Angio-edema    Anxiety    related to surgery   Asthma    GERD (gastroesophageal reflux disease)    History of blood transfusion    HTN (hypertension)    Ulcer      Allergies  Allergen Reactions   Citrus Swelling   Chlorthalidone Other (See Comments)    Hyponatremia   Fruit Extracts Swelling    Pt experiences swelling with most fruits      Current Outpatient Medications on File Prior to Visit  Medication Sig Dispense Refill   albuterol (VENTOLIN HFA) 108 (90 Base) MCG/ACT inhaler Inhale 2 puffs into the lungs every 6 (six) hours as needed for wheezing or shortness of breath. 18 g 2   amLODipine (NORVASC) 10 MG tablet Take 1 tablet (10 mg total) by mouth daily. 90 tablet 3   aspirin EC 81 MG tablet Take 81 mg by mouth daily.     atorvastatin (LIPITOR) 20 MG tablet Take 1 tablet (20 mg total) by mouth daily. 90 tablet 0   EPINEPHrine 0.3 mg/0.3 mL IJ SOAJ injection Inject 0.3 mg into the muscle as needed for anaphylaxis. 1 each 2   escitalopram (LEXAPRO) 20 MG tablet Take 1 tablet (20 mg total) by mouth daily. 90 tablet 1   famotidine (PEPCID) 20 MG tablet Take 1 tablet (20 mg total) by mouth 2 (two) times daily for 7 days. 14 tablet 0   guaiFENesin 200 MG tablet Take 1 tablet (200 mg total) by mouth every 4 (four) hours as needed for cough or to loosen phlegm. 30 suppository 1   loratadine (CLARITIN) 10 MG tablet Take 1 tablet (10 mg total) by mouth daily as needed for allergies. 100 tablet 1   valsartan (DIOVAN) 40 MG tablet Take 1 tablet (40 mg total) by mouth  daily. 90 tablet 1   No current facility-administered medications on file prior to visit.     Review of System: Comprehensive ROS Pertinent positive and negative noted in HPI    Objective:  BP 133/89 (BP Location: Right Arm, Patient Position: Sitting, Cuff Size: Normal)   Pulse 95   Resp 16   Wt 153 lb 3.2 oz (69.5 kg)   SpO2 97%   BMI 26.71 kg/m    Physical Exam: General Appearance: Well nourished, in no apparent distress. Eyes: PERRLA, EOMs, conjunctiva no swelling or erythema ENT/Mouth: Ext aud canals clear, Hearing normal. Poor dentition  Neck: Supple, thyroid normal.  Respiratory: Respiratory effort normal, BS equal bilaterally without rales, rhonchi, wheezing or stridor.  Cardio: RRR with no MRGs. Brisk peripheral pulses without edema.  Abdomen: Soft, + BS.  Non tender, no guarding, rebound, hernias, masses. Lymphatics: Non tender without lymphadenopathy.  Musculoskeletal: Full ROM, 5/5 strength, normal gait.  Skin: Warm, dry without rashes, lesions, ecchymosis.  Psych: Awake and oriented X 3, normal affect, Insight and Judgment appropriate.    Assessment:  Casey Warren was seen today for hospitalization follow-up.  Diagnoses and all orders for this visit:  TOBACCO ABUSE - I have recommended complete cessation  of tobacco use. I have discussed various options available for assistance with tobacco cessation including over the counter methods (Nicotine gum, patch and lozenges). We also discussed prescription options (Chantix, Nicotine Inhaler / Nasal Spray). The patient is not interested in pursuing any prescription tobacco cessation options at this time. - Patient declines at this time.   Alcohol abuse States doing better unclear of what is better  Seizures [facility]) -     Ambulatory referral to Neurology  Colon cancer screening -     Ambulatory referral to Gastroenterology  Hospital discharge follow-up  F/U with PCP   This note has been created with Dragon speech  recognition software and smart Lobbyist. Any transcriptional errors are unintentional.   Grayce Sessions, NP 03/27/2023, 1:58 PM

## 2023-04-21 ENCOUNTER — Other Ambulatory Visit: Payer: Self-pay

## 2023-05-06 ENCOUNTER — Other Ambulatory Visit: Payer: Self-pay

## 2023-05-13 ENCOUNTER — Telehealth (INDEPENDENT_AMBULATORY_CARE_PROVIDER_SITE_OTHER): Payer: Self-pay | Admitting: Primary Care

## 2023-05-13 NOTE — Telephone Encounter (Signed)
Will forward to provider  
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
## 2023-05-15 ENCOUNTER — Encounter: Payer: Self-pay | Admitting: Gastroenterology

## 2023-05-15 ENCOUNTER — Other Ambulatory Visit (INDEPENDENT_AMBULATORY_CARE_PROVIDER_SITE_OTHER): Payer: Self-pay | Admitting: Primary Care

## 2023-05-15 ENCOUNTER — Ambulatory Visit (INDEPENDENT_AMBULATORY_CARE_PROVIDER_SITE_OTHER): Payer: Medicaid Other | Admitting: Primary Care

## 2023-05-15 DIAGNOSIS — G8929 Other chronic pain: Secondary | ICD-10-CM

## 2023-05-16 ENCOUNTER — Other Ambulatory Visit: Payer: Self-pay

## 2023-05-19 ENCOUNTER — Ambulatory Visit: Payer: Medicaid Other | Admitting: Physician Assistant

## 2023-05-20 ENCOUNTER — Other Ambulatory Visit (INDEPENDENT_AMBULATORY_CARE_PROVIDER_SITE_OTHER): Payer: 59

## 2023-05-20 ENCOUNTER — Ambulatory Visit (INDEPENDENT_AMBULATORY_CARE_PROVIDER_SITE_OTHER): Payer: 59 | Admitting: Physician Assistant

## 2023-05-20 DIAGNOSIS — M5440 Lumbago with sciatica, unspecified side: Secondary | ICD-10-CM | POA: Diagnosis not present

## 2023-05-20 DIAGNOSIS — M545 Low back pain, unspecified: Secondary | ICD-10-CM | POA: Insufficient documentation

## 2023-05-20 NOTE — Progress Notes (Addendum)
Office Visit Note   Patient: Casey Warren           Date of Birth: 1960-09-19           MRN: [id number] Visit Date: 05/20/2023              Requested by: Grayce Sessions, NP [address] [place],  [place] [address] PCP: Grayce Sessions, NP  Chief Complaint  Patient presents with   Right Knee - Pain   Left Knee - Pain      HPI: Patient is a 62 year old gentleman who comes in today with his sister.  His sister and here are very concerned because he has had a 1 year history of his legs just giving way on him.  It does this frequently and is getting worse.  He denies any radicular symptoms.  He denies any pain when this happens.  He has had several falls.  He also relates to issues with dizziness and lightheadedness.  His sister is concerned because she said that his family said that sometimes his facial expressions differ.  Assessment & Plan: Visit Diagnoses:  1. Low back pain with sciatica, sciatica laterality unspecified, unspecified back pain laterality, unspecified chronicity     Plan: Lumbar radiculopathy versus neurologic causes for his falls.  Based on reviewing CAT scans and based on his history of falls requiring transport to the hospital and some of the findings with the CAT scans and his symptoms of dizziness lightheadedness and early mental status changes.  He has also been transported to the hospital for seizures but I do not see any record of medication being given for this but he takes.  He does drink alcohol he says 3 beers a day.  He does not feel this is related to his seizure activity he has a family history in his sister who had several small strokes and is passed away.  I think I am not finding anything radicular I would recommend referral to a neurologist first.  Could consider MRI of his lumbar spine in the future.  Also discussed with he and his sister the use of a cane for his balance until the cause of his legs giving way is better  define  Follow-Up Instructions: With neurologist  Ortho Exam  Patient is alert, oriented, no adenopathy, well-dressed, normal affect, normal respiratory effort. Examination he actually has good strength when he sitting he has good dorsiflexion plantarflexion extension and flexion of his legs.  He has no tenderness in the lower back negative straight leg raise no tenderness with range of motion of either of his knees or crepitus  Imaging: XR Lumbar Spine 2-3 Views  Result Date: 05/20/2023 Radiographs of his lower back demonstrate overall well-maintained alignment does have some degenerative facet changes.  No acute changes  No images are attached to the encounter.  Labs: Lab Results  Component Value Date   HGBA1C 5.3 (A) 10/23/2022   HGBA1C 5.7 (A) 08/22/2021   HGBA1C 5.7 02/22/2021     Lab Results  Component Value Date   ALBUMIN 3.2 (L) 03/10/2023   ALBUMIN 4.4 01/23/2023   ALBUMIN 4.0 07/02/2022    Lab Results  Component Value Date   MG 1.9 11/30/2019   No results found for: "VD25OH"  No results found for: "PREALBUMIN"    Latest Ref Rng & Units 03/10/2023    4:52 PM 07/02/2022    3:30 AM 07/01/2022    2:45 PM  CBC EXTENDED  WBC 4.0 - 10.5 K/uL 4.7  5.2  5.9   RBC 4.22 - 5.81 MIL/uL 4.67  4.56  5.03   Hemoglobin 13.0 - 17.0 g/dL 16.1  09.6  04.5   HCT 39.0 - 52.0 % 42.1  40.3  45.1   Platelets 150 - 400 K/uL 298  242  277   NEUT# 1.7 - 7.7 K/uL 3.2   2.9   Lymph# 0.7 - 4.0 K/uL 0.9   2.2      There is no height or weight on file to calculate BMI.  Orders:  Orders Placed This Encounter  Procedures   XR Lumbar Spine 2-3 Views   Ambulatory referral to Neurology   No orders of the defined types were placed in this encounter.    Procedures: No procedures performed  Clinical Data: No additional findings.  ROS:  All other systems negative, except as noted in the HPI. Review of Systems  Objective: Vital Signs: There were no vitals taken for this  visit.  Specialty Comments:  No specialty comments available.  PMFS History: Patient Active Problem List   Diagnosis Date Noted   Low back pain 05/20/2023   Angioedema 07/01/2022   Anxiety 07/01/2022   Hyponatremia 11/30/2019   AKI (acute kidney injury) (HCC) 11/30/2019   Hypotension due to medication 11/30/2019   Seizures (HCC) 11/30/2019   Abnormal EKG 04/06/2017   Essential hypertension 04/06/2017   ANEMIA 04/28/2008   Alcohol abuse 04/28/2008   TOBACCO ABUSE 04/28/2008   SUBSTANCE ABUSE, MULTIPLE 04/28/2008   Dental caries 04/28/2008   GERD 04/28/2008   PEPTIC ULCER DISEASE, HELICOBACTER PYLORI POSITIVE 04/28/2008   Past Medical History:  Diagnosis Date   Angio-edema    Anxiety    related to surgery   Asthma    GERD (gastroesophageal reflux disease)    History of blood transfusion    HTN (hypertension)    Ulcer     Family History  Problem Relation Age of Onset   Stroke Mother        Died age 39   Hypertension Mother    Diabetes Mother    Breast cancer Mother    Leukemia Father        Died age 34   Allergic rhinitis Sister    Asthma Grandson     Past Surgical History:  Procedure Laterality Date   CLOSED REDUCTION NASAL FRACTURE  11/19/2011   Procedure: CLOSED REDUCTION NASAL FRACTURE;  Surgeon: Flo Shanks, MD;  Location: [facility] OR;  Service: ENT;  Laterality: N/A;  CLOSED REDUCTION NASAL AND NASAL SEPTAL FRACTURE WITH STABILIZATION   No surgical     Social History   Occupational History   Not on file  Tobacco Use   Smoking status: Every Day    Current packs/day: 0.50    Types: Cigars, Cigarettes    Passive exposure: Never   Smokeless tobacco: Never   Tobacco comments:    2 black-n-milds   Vaping Use   Vaping status: Never Used  Substance and Sexual Activity   Alcohol use: Yes    Comment: Two cans of beer dialy.    Drug use: No   Sexual activity: Not on file

## 2023-06-06 ENCOUNTER — Other Ambulatory Visit (INDEPENDENT_AMBULATORY_CARE_PROVIDER_SITE_OTHER): Payer: Self-pay | Admitting: Primary Care

## 2023-06-06 DIAGNOSIS — F418 Other specified anxiety disorders: Secondary | ICD-10-CM

## 2023-06-06 DIAGNOSIS — Z760 Encounter for issue of repeat prescription: Secondary | ICD-10-CM

## 2023-06-06 DIAGNOSIS — E782 Mixed hyperlipidemia: Secondary | ICD-10-CM

## 2023-06-06 NOTE — Telephone Encounter (Signed)
Medication Refill -  Most Recent Primary Care Visit:  Provider: Grayce Sessions  Department: [facility]  Visit Type: HOSPITAL FU  Date: 03/27/2023  Medication: atorvastatin (LIPITOR) 20 MG table escitalopram (LEXAPRO) 20 MG table loratadine (CLARITIN) 10 MG tablet valsartan (DIOVAN) 40 MG tablet  Has the patient contacted their pharmacy? No (Agent: If no, request that the patient contact the pharmacy for the refill. If patient does not wish to contact the pharmacy document the reason why and proceed with request.) (Agent: If yes, when and what did the pharmacy advise?)  Is this the correct pharmacy for this prescription? Yes If no, delete pharmacy and type the correct one.  This is the patient's preferred pharmacy:  [id number] [place], [place] - [address] AT [facility] [address] [place] [address] Phone: [phone] Fax: [phone]   Has the prescription been filled recently? No  Is the patient out of the medication? No  Has the patient been seen for an appointment in the last year OR does the patient have an upcoming appointment? Yes  Can we respond through MyChart? No  Pt states they have moved and now using a new pharmacy.  Pt did not understand how to transfer any meds.  Can you please help him get his meds straight?

## 2023-06-12 ENCOUNTER — Telehealth: Payer: Self-pay | Admitting: Primary Care

## 2023-06-12 MED ORDER — ESCITALOPRAM OXALATE 20 MG PO TABS: 20.0000 mg | ORAL_TABLET | Freq: Every day | ORAL | 0 refills | Status: DC

## 2023-06-12 MED ORDER — ATORVASTATIN CALCIUM 20 MG PO TABS: 20.0000 mg | ORAL_TABLET | Freq: Every day | ORAL | 0 refills | Status: DC

## 2023-06-12 MED ORDER — VALSARTAN 40 MG PO TABS: 40.0000 mg | ORAL_TABLET | Freq: Every day | ORAL | 0 refills | Status: DC

## 2023-06-12 MED ORDER — LORATADINE 10 MG PO TABS: 10.0000 mg | ORAL_TABLET | Freq: Every day | ORAL | 0 refills | Status: DC | PRN

## 2023-06-12 NOTE — Telephone Encounter (Signed)
 Pt's wife called back and was given appointment date & time, as well as address & phone number to [facility].

## 2023-06-12 NOTE — Telephone Encounter (Signed)
 [phone].  All labs in date.  Requested Prescriptions  Pending Prescriptions Disp Refills   valsartan  (DIOVAN ) 40 MG tablet 90 tablet 0    Sig: Take 1 tablet (40 mg total) by mouth daily.     Cardiovascular:  Angiotensin Receptor Blockers Passed - 06/12/2023  8:01 AM      Passed - Cr in normal range and within 180 days    Creatinine, Ser  Date Value Ref Range Status  03/10/2023 0.98 0.61 - 1.24 mg/dL Final         Passed - K in normal range and within 180 days    Potassium  Date Value Ref Range Status  03/10/2023 4.0 3.5 - 5.1 mmol/L Final         Passed - Patient is not pregnant      Passed - Last BP in normal range    BP Readings from Last 1 Encounters:  03/27/23 133/89         Passed - Valid encounter within last 6 months    Recent Outpatient Visits           2 months ago Hospital discharge follow-up   [place] [facility] Celestia Rosaline SQUIBB, NP   3 months ago Medication refill   [place] [facility] Celestia Rosaline SQUIBB, NP   4 months ago Essential hypertension   [place] [facility] Celestia Rosaline SQUIBB, NP   7 months ago Essential hypertension   [place] [facility] Celestia Rosaline SQUIBB, NP   10 months ago Hospital discharge follow-up   [place] [facility] Celestia Rosaline SQUIBB, NP       Future Appointments             In 2 weeks Celestia Rosaline SQUIBB, NP [place] [facility]             escitalopram  (LEXAPRO ) 20 MG tablet 90 tablet 0    Sig: Take 1 tablet (20 mg total) by mouth daily.     Psychiatry:  Antidepressants - SSRI Passed - 06/12/2023  8:01 AM      Passed - Valid encounter within last 6 months    Recent Outpatient Visits           2 months ago Hospital discharge follow-up   [place] [facility] Celestia Rosaline SQUIBB, NP   3 months ago Medication refill   [place] [facility] Celestia Rosaline SQUIBB, NP   4 months ago Essential hypertension   [place] [facility] Celestia Rosaline SQUIBB, NP   7 months ago Essential hypertension   [place] [facility] Celestia Rosaline SQUIBB, NP   10 months ago Hospital discharge follow-up   [place] [facility] Celestia Rosaline SQUIBB, NP       Future Appointments             In 2 weeks Celestia Rosaline SQUIBB, NP [place] [facility]             atorvastatin  (LIPITOR) 20 MG tablet 90 tablet 0    Sig: Take 1 tablet (20 mg total) by mouth daily.     Cardiovascular:  Antilipid - Statins Failed - 06/12/2023  8:01 AM      Failed - Lipid Panel in normal range within the last 12 months    Cholesterol, Total  Date Value Ref Range Status  02/13/2023 125 100 -  199 mg/dL Final   LDL Chol Calc (NIH)  Date Value Ref Range Status  02/13/2023 44 0 - 99 mg/dL Final   HDL  Date Value Ref Range Status  02/13/2023 63 >39 mg/dL Final   Triglycerides  Date Value Ref Range Status  02/13/2023 94 0 - 149 mg/dL Final         Passed - Patient is not pregnant      Passed - Valid encounter within last 12 months    Recent Outpatient Visits           2 months ago Hospital discharge follow-up   [place] [facility] Celestia Rosaline SQUIBB, NP   3 months ago Medication refill   [place] [facility] Celestia Rosaline SQUIBB, NP   4 months ago Essential hypertension   [place] [facility] Celestia Rosaline SQUIBB, NP   7 months ago Essential hypertension   [place] [facility] Celestia Rosaline SQUIBB, NP   10 months ago Hospital discharge follow-up   [place] [facility] Celestia Rosaline SQUIBB, NP       Future Appointments             In 2 weeks Celestia Rosaline SQUIBB, NP [place] [facility]             loratadine  (CLARITIN ) 10 MG tablet 90 tablet 0    Sig: Take  1 tablet (10 mg total) by mouth daily as needed for allergies.     Ear, Nose, and Throat:  Antihistamines 2 Passed - 06/12/2023  8:01 AM      Passed - Cr in normal range and within 360 days    Creatinine, Ser  Date Value Ref Range Status  03/10/2023 0.98 0.61 - 1.24 mg/dL Final         Passed - Valid encounter within last 12 months    Recent Outpatient Visits           2 months ago Hospital discharge follow-up   [place] [facility] Celestia Rosaline SQUIBB, NP   3 months ago Medication refill   [place] [facility] Celestia Rosaline SQUIBB, NP   4 months ago Essential hypertension   [place] [facility] Celestia Rosaline SQUIBB, NP   7 months ago Essential hypertension   [place] [facility] Celestia Rosaline SQUIBB, NP   10 months ago Hospital discharge follow-up   [place] [facility] Celestia Rosaline SQUIBB, NP       Future Appointments             In 2 weeks Celestia, Rosaline SQUIBB, NP  [facility]

## 2023-06-12 NOTE — Telephone Encounter (Signed)
 Copied from CRM [id number]. Topic: Referral - Question >> Jun 12, 2023  9:32 AM Haroldine Laws wrote: Reason for CRM: pt's wife called asking about her husbands appt with neurology that was scheduled.  No one has called them yet.  CB@  [phone]

## 2023-06-13 ENCOUNTER — Ambulatory Visit: Payer: No Typology Code available for payment source | Admitting: Podiatry

## 2023-06-13 NOTE — Telephone Encounter (Signed)
 Noted.

## 2023-06-20 ENCOUNTER — Telehealth: Payer: Self-pay

## 2023-06-20 NOTE — Telephone Encounter (Signed)
 Left message for patient to call [facility]

## 2023-06-20 NOTE — Telephone Encounter (Signed)
 Patient rescheduled pre visit for 06/24/23

## 2023-06-20 NOTE — Telephone Encounter (Signed)
 No answer .  Left message that I was trying to reach him for his pre visit appt.

## 2023-06-20 NOTE — Telephone Encounter (Signed)
 Left another message letting him know that he needed to call back and reschedule pre visit with nurse to avoid cancellation on colonoscopy

## 2023-06-24 ENCOUNTER — Ambulatory Visit (AMBULATORY_SURGERY_CENTER): Payer: 59 | Admitting: *Deleted

## 2023-06-24 VITALS — Ht 64.0 in | Wt 160.0 lb

## 2023-06-24 DIAGNOSIS — Z1211 Encounter for screening for malignant neoplasm of colon: Secondary | ICD-10-CM

## 2023-06-24 MED ORDER — SUFLAVE 178.7 G PO SOLR: 1.0000 | Freq: Once | ORAL | 0 refills | Status: AC

## 2023-06-24 NOTE — Progress Notes (Signed)
 Pre visit completed over telephone with wife. Instructions mailed. Advised that [facility] would be calling    No egg or soy allergy known to patient  No issues known to pt with past sedation with any surgeries or procedures Patient denies ever being told they had issues or difficulty with intubation  No FH of Malignant Hyperthermia Pt is not on diet pills Pt is not on  home 02  Pt is not on blood thinners  Pt denies issues with constipation  No A fib or A flutter Have any cardiac testing pending--NO Pt instructed to use Singlecare.com or GoodRx for a price reduction on prep

## 2023-06-30 ENCOUNTER — Ambulatory Visit (INDEPENDENT_AMBULATORY_CARE_PROVIDER_SITE_OTHER): Payer: Medicaid Other | Admitting: Primary Care

## 2023-07-02 ENCOUNTER — Ambulatory Visit (INDEPENDENT_AMBULATORY_CARE_PROVIDER_SITE_OTHER): Payer: Medicaid Other | Admitting: Primary Care

## 2023-07-07 ENCOUNTER — Encounter: Payer: Self-pay | Admitting: Neurology

## 2023-07-07 ENCOUNTER — Ambulatory Visit (INDEPENDENT_AMBULATORY_CARE_PROVIDER_SITE_OTHER): Payer: No Typology Code available for payment source | Admitting: Neurology

## 2023-07-07 VITALS — BP 164/96 | HR 86 | Ht 64.0 in | Wt 163.0 lb

## 2023-07-07 DIAGNOSIS — F40298 Other specified phobia: Secondary | ICD-10-CM | POA: Diagnosis not present

## 2023-07-07 DIAGNOSIS — R269 Unspecified abnormalities of gait and mobility: Secondary | ICD-10-CM | POA: Diagnosis not present
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
## 2023-07-08 ENCOUNTER — Encounter: Payer: Self-pay | Admitting: Neurology

## 2023-07-08 NOTE — Patient Instructions (Signed)
Continue current medications  Referral to physical therapy for gait training  Discussed need for alcohol cessation  Return as needed

## 2023-07-10 ENCOUNTER — Encounter: Payer: Self-pay | Admitting: Gastroenterology

## 2023-07-10 ENCOUNTER — Ambulatory Visit (AMBULATORY_SURGERY_CENTER): Payer: Medicaid Other | Admitting: Gastroenterology

## 2023-07-10 VITALS — BP 153/98 | HR 82 | Temp 99.1°F | Resp 24 | Ht 64.0 in | Wt 160.0 lb

## 2023-07-10 DIAGNOSIS — K640 First degree hemorrhoids: Secondary | ICD-10-CM | POA: Diagnosis not present

## 2023-07-10 DIAGNOSIS — K529 Noninfective gastroenteritis and colitis, unspecified: Secondary | ICD-10-CM

## 2023-07-10 DIAGNOSIS — I10 Essential (primary) hypertension: Secondary | ICD-10-CM | POA: Diagnosis not present

## 2023-07-10 DIAGNOSIS — F419 Anxiety disorder, unspecified: Secondary | ICD-10-CM | POA: Diagnosis not present

## 2023-07-10 DIAGNOSIS — K5730 Diverticulosis of large intestine without perforation or abscess without bleeding: Secondary | ICD-10-CM | POA: Diagnosis not present

## 2023-07-10 DIAGNOSIS — Z1211 Encounter for screening for malignant neoplasm of colon: Secondary | ICD-10-CM | POA: Diagnosis present

## 2023-07-10 DIAGNOSIS — K639 Disease of intestine, unspecified: Secondary | ICD-10-CM

## 2023-07-10 DIAGNOSIS — D122 Benign neoplasm of ascending colon: Secondary | ICD-10-CM

## 2023-07-10 MED ORDER — SODIUM CHLORIDE 0.9 % IV SOLN: 500.0000 mL | Freq: Once | INTRAVENOUS | Status: DC

## 2023-07-10 NOTE — Patient Instructions (Signed)
Resume previous diet and medications.  Handouts provided on polyps, hemorrhoids, and diverticulosis.  Follow up colonoscopy based on biopsy results.  Dr. Tomasa Rand recommended high fiber diet/daily fiber supplement (medication can be found over the counter).    YOU HAD AN ENDOSCOPIC PROCEDURE TODAY AT THE [facility]:   Refer to the procedure report that was given to you for any specific questions about what was found during the examination.  If the procedure report does not answer your questions, please call your gastroenterologist to clarify.  If you requested that your care partner not be given the details of your procedure findings, then the procedure report has been included in a sealed envelope for you to review at your convenience later.  YOU SHOULD EXPECT: Some feelings of bloating in the abdomen. Passage of more gas than usual.  Walking can help get rid of the air that was put into your GI tract during the procedure and reduce the bloating. If you had a lower endoscopy (such as a colonoscopy or flexible sigmoidoscopy) you may notice spotting of blood in your stool or on the toilet paper. If you underwent a bowel prep for your procedure, you may not have a normal bowel movement for a few days.  Please Note:  You might notice some irritation and congestion in your nose or some drainage.  This is from the oxygen used during your procedure.  There is no need for concern and it should clear up in a day or so.  SYMPTOMS TO REPORT IMMEDIATELY:  Following lower endoscopy (colonoscopy or flexible sigmoidoscopy):  Excessive amounts of blood in the stool  Significant tenderness or worsening of abdominal pains  Swelling of the abdomen that is new, acute  Fever of 100F or higher  For urgent or emergent issues, a gastroenterologist can be reached at any hour by calling [phone]. Do not use MyChart messaging for urgent concerns.    DIET:  We do recommend a small meal at first, but  then you may proceed to your regular diet.  Drink plenty of fluids but you should avoid alcoholic beverages for 24 hours.  ACTIVITY:  You should plan to take it easy for the rest of today and you should NOT DRIVE or use heavy machinery until tomorrow (because of the sedation medicines used during the test).    FOLLOW UP: Our staff will call the number listed on your records the next business day following your procedure.  We will call around 7:15- 8:00 am to check on you and address any questions or concerns that you may have regarding the information given to you following your procedure. If we do not reach you, we will leave a message.     If any biopsies were taken you will be contacted by phone or by letter within the next 1-3 weeks.  Please call us at [phone] if you have not heard about the biopsies in 3 weeks.    SIGNATURES/CONFIDENTIALITY: You and/or your care partner have signed paperwork which will be entered into your electronic medical record.  These signatures attest to the fact that that the information above on your After Visit Summary has been reviewed and is understood.  Full responsibility of the confidentiality of this discharge information lies with you and/or your care-partner.

## 2023-07-10 NOTE — Progress Notes (Signed)
Called to room to assist during endoscopic procedure.  Patient ID and intended procedure confirmed with present staff. Received instructions for my participation in the procedure from the performing physician.

## 2023-07-10 NOTE — Progress Notes (Signed)
[facility] History and Physical   Primary Care Physician:  Grayce Sessions, NP   Reason for Procedure:   Colon cancer screening  Plan:    Screening colonoscopy     HPI: Casey Warren is a 63 y.o. male undergoing initial average risk screening colonoscopy.  He has no family history of colon cancer and no chronic GI symptoms.    Past Medical History:  Diagnosis Date   Angio-edema    Anxiety    related to surgery   Asthma    GERD (gastroesophageal reflux disease)    History of blood transfusion    HTN (hypertension)    Seizures (HCC)    last seizure in 2023 per patient   Ulcer     Past Surgical History:  Procedure Laterality Date   CLOSED REDUCTION NASAL FRACTURE  11/19/2011   Procedure: CLOSED REDUCTION NASAL FRACTURE;  Surgeon: Flo Shanks, MD;  Location: [facility] OR;  Service: ENT;  Laterality: N/A;  CLOSED REDUCTION NASAL AND NASAL SEPTAL FRACTURE WITH STABILIZATION   No surgical      Prior to Admission medications   Medication Sig Start Date End Date Taking? Authorizing Provider  aspirin EC 81 MG tablet Take 81 mg by mouth daily.   Yes [provider]  atorvastatin (LIPITOR) 20 MG tablet Take 1 tablet (20 mg total) by mouth daily. 06/12/23  Yes Grayce Sessions, NP  escitalopram (LEXAPRO) 20 MG tablet Take 1 tablet (20 mg total) by mouth daily. 06/12/23  Yes Grayce Sessions, NP  loratadine (CLARITIN) 10 MG tablet Take 1 tablet (10 mg total) by mouth daily as needed for allergies. 06/12/23  Yes Grayce Sessions, NP  valsartan (DIOVAN) 40 MG tablet Take 1 tablet (40 mg total) by mouth daily. 06/12/23  Yes Grayce Sessions, NP  albuterol (VENTOLIN HFA) 108 (90 Base) MCG/ACT inhaler Inhale 2 puffs into the lungs every 6 (six) hours as needed for wheezing or shortness of breath. 02/13/23   Grayce Sessions, NP  amLODipine (NORVASC) 10 MG tablet Take 1 tablet (10 mg total) by mouth daily. Patient not taking: Reported on 07/07/2023 07/19/22    Grayce Sessions, NP  EPINEPHrine 0.3 mg/0.3 mL IJ SOAJ injection Inject 0.3 mg into the muscle as needed for anaphylaxis. Patient not taking: Reported on 07/07/2023 08/15/22   Alfonse Spruce, MD  famotidine (PEPCID) 20 MG tablet Take 1 tablet (20 mg total) by mouth 2 (two) times daily for 7 days. 07/02/22 07/09/22  Glade Lloyd, MD  guaiFENesin 200 MG tablet Take 1 tablet (200 mg total) by mouth every 4 (four) hours as needed for cough or to loosen phlegm. Patient not taking: Reported on 07/07/2023 02/13/23   Grayce Sessions, NP    Current Outpatient Medications  Medication Sig Dispense Refill   aspirin EC 81 MG tablet Take 81 mg by mouth daily.     atorvastatin (LIPITOR) 20 MG tablet Take 1 tablet (20 mg total) by mouth daily. 90 tablet 0   escitalopram (LEXAPRO) 20 MG tablet Take 1 tablet (20 mg total) by mouth daily. 90 tablet 0   loratadine (CLARITIN) 10 MG tablet Take 1 tablet (10 mg total) by mouth daily as needed for allergies. 90 tablet 0   valsartan (DIOVAN) 40 MG tablet Take 1 tablet (40 mg total) by mouth daily. 90 tablet 0   albuterol (VENTOLIN HFA) 108 (90 Base) MCG/ACT inhaler Inhale 2 puffs into the lungs every 6 (six) hours as needed for wheezing  or shortness of breath. 18 g 2   amLODipine (NORVASC) 10 MG tablet Take 1 tablet (10 mg total) by mouth daily. (Patient not taking: Reported on 07/07/2023) 90 tablet 3   EPINEPHrine 0.3 mg/0.3 mL IJ SOAJ injection Inject 0.3 mg into the muscle as needed for anaphylaxis. (Patient not taking: Reported on 07/07/2023) 1 each 2   famotidine (PEPCID) 20 MG tablet Take 1 tablet (20 mg total) by mouth 2 (two) times daily for 7 days. 14 tablet 0   guaiFENesin 200 MG tablet Take 1 tablet (200 mg total) by mouth every 4 (four) hours as needed for cough or to loosen phlegm. (Patient not taking: Reported on 07/07/2023) 30 suppository 1   Current Facility-Administered Medications  Medication Dose Route Frequency Provider Last Rate Last Admin    0.9 %  sodium chloride infusion  500 mL Intravenous Once Jenel Lucks, MD        Allergies as of 07/10/2023 - Review Complete 07/10/2023  Allergen Reaction Noted   Citrus Swelling 02/05/2017   Fruit extracts Swelling 07/01/2022   Chlorthalidone Other (See Comments) 11/30/2019    Family History  Problem Relation Age of Onset   Stroke Mother        Died age 52   Hypertension Mother    Diabetes Mother    Breast cancer Mother    Leukemia Father        Died age 73   Allergic rhinitis Sister    Asthma Grandson    Colon cancer Neg Hx     Social History   Socioeconomic History   Marital status: Married    Spouse name: Not on file   Number of children: 3   Years of education: Not on file   Highest education level: Not on file  Occupational History   Not on file  Tobacco Use   Smoking status: Every Day    Current packs/day: 0.50    Types: Cigars, Cigarettes    Passive exposure: Never   Smokeless tobacco: Never   Tobacco comments:    2 -3 black-n-milds a day   Vaping Use   Vaping status: Never Used  Substance and Sexual Activity   Alcohol use: Yes    Comment: Two cans of beer dialy.    Drug use: No   Sexual activity: Not on file  Other Topics Concern   Not on file  Social History Narrative   Lives in 1 story home   Pt and wife live with their daughter, her husband and their 3 children   12 grade education   Works as Tour manager   Social Drivers of Corporate investment banker Strain: Not on Ship broker Insecurity: Not on file  Transportation Needs: Not on file  Physical Activity: Not on file  Stress: Not on file  Social Connections: Not on file  Intimate Partner Violence: Not on file    Review of Systems:  All other review of systems negative except as mentioned in the HPI.  Physical Exam: Vital signs BP (!) 161/112   Pulse (!) 103   Temp 99.1 F (37.3 C)   Ht 5\' 4"  (1.626 m)   Wt 160 lb (72.6 kg)   SpO2 96%   BMI 27.46  kg/m   General:   Alert,  Well-developed, well-nourished, pleasant and cooperative in NAD Airway:  Mallampati 2 Lungs:  Clear throughout to auscultation.   Heart:  Regular rate and rhythm; no murmurs, clicks, rubs,  or gallops. Abdomen:  Soft, nontender and nondistended. Normal bowel sounds.   Neuro/Psych:  Normal mood and affect. A and O x 3   Clee Pandit E. Tomasa Rand, MD [facility]

## 2023-07-10 NOTE — Progress Notes (Signed)
Vss nad trans to pacu

## 2023-07-10 NOTE — Op Note (Signed)
[facility] Patient Name: Casey Warren Procedure Date: 07/10/2023 11:38 AM MRN: [id number] Endoscopist: Lorin Picket E. Tomasa Rand , MD, [id number] Age: 63 Referring MD:  Date of Birth: [birth year] Gender: Male Account #: [id number] Procedure:                Colonoscopy Indications:              Screening for colorectal malignant neoplasm, This                            is the patient's first colonoscopy Medicines:                Monitored Anesthesia Care Procedure:                Pre-Anesthesia Assessment:                           - Prior to the procedure, a History and Physical                            was performed, and patient medications and                            allergies were reviewed. The patient's tolerance of                            previous anesthesia was also reviewed. The risks                            and benefits of the procedure and the sedation                            options and risks were discussed with the patient.                            All questions were answered, and informed consent                            was obtained. Prior Anticoagulants: The patient has                            taken no anticoagulant or antiplatelet agents. ASA                            Grade Assessment: II - A patient with mild systemic                            disease. After reviewing the risks and benefits,                            the patient was deemed in satisfactory condition to                            undergo the procedure.  After obtaining informed consent, the colonoscope                            was passed under direct vision. Throughout the                            procedure, the patient's blood pressure, pulse, and                            oxygen saturations were monitored continuously. The                            CF [id number] #[id number] was introduced through the anus                            and advanced to  the the cecum, identified by                            appendiceal orifice and ileocecal valve. The                            colonoscopy was performed without difficulty. The                            patient tolerated the procedure well. The quality                            of the bowel preparation was adequate. The                            ileocecal valve, appendiceal orifice, and rectum                            were photographed. The bowel preparation used was                            SUPREP via split dose instruction. Scope In: 11:51:56 AM Scope Out: 12:13:25 PM Scope Withdrawal Time: 0 hours 15 minutes 35 seconds  Total Procedure Duration: 0 hours 21 minutes 29 seconds  Findings:                 The perianal and digital rectal examinations were                            normal. Pertinent negatives include normal                            sphincter tone and no palpable rectal lesions.                           An 8 mm polyp was found in the ascending colon. The                            polyp was sessile. The polyp was removed  with a                            cold snare. Resection and retrieval were complete.                            Estimated blood loss was minimal.                           A localized area of mildly erythematous mucosa was                            found in the ascending colon. Biopsies were taken                            with a cold forceps for histology. Estimated blood                            loss was minimal.                           Many large-mouthed and small-mouthed diverticula                            were found in the sigmoid colon, descending colon,                            transverse colon and ascending colon.                           The exam was otherwise normal throughout the                            examined colon.                           Non-bleeding internal hemorrhoids were found during                             retroflexion. The hemorrhoids were Grade I                            (internal hemorrhoids that do not prolapse).                           No additional abnormalities were found on                            retroflexion. Complications:            No immediate complications. Estimated Blood Loss:     Estimated blood loss was minimal. Impression:               - One 8 mm polyp in the ascending colon, removed  with a cold snare. Resected and retrieved.                           - Erythematous mucosa in the ascending colon.                            Biopsied.                           - Severe diverticulosis in the sigmoid colon, in                            the descending colon, in the transverse colon and                            in the ascending colon.                           - Non-bleeding internal hemorrhoids. Recommendation:           - Patient has a contact number available for                            emergencies. The signs and symptoms of potential                            delayed complications were discussed with the                            patient. Return to normal activities tomorrow.                            Written discharge instructions were provided to the                            patient.                           - Resume previous diet.                           - Continue present medications.                           - Await pathology results.                           - Repeat colonoscopy (date not yet determined) for                            surveillance based on pathology results.                           - Recommend high fiber diet/daily fiber supplement                            to reduce risk of diverticular  complications. Taheera Thomann E. Tomasa Rand, MD 07/10/2023 12:21:31 PM This report has been signed electronically.

## 2023-07-11 ENCOUNTER — Ambulatory Visit: Payer: 59 | Admitting: Physical Therapy

## 2023-07-11 ENCOUNTER — Telehealth: Payer: Self-pay

## 2023-07-11 NOTE — Telephone Encounter (Signed)
  Follow up Call-     07/10/2023   10:57 AM  Call back number  Post procedure Call Back phone  # [phone]  Permission to leave phone message Yes     Patient questions:  Do you have a fever, pain , or abdominal swelling? No. Pain Score  0 *  Have you tolerated food without any problems? Yes.    Have you been able to return to your normal activities? Yes.    Do you have any questions about your discharge instructions: Diet   No. Medications  No. Follow up visit  No.  Do you have questions or concerns about your Care? No.  Actions: * If pain score is 4 or above: No action needed, pain <4.

## 2023-07-14 ENCOUNTER — Ambulatory Visit (INDEPENDENT_AMBULATORY_CARE_PROVIDER_SITE_OTHER): Payer: Medicaid Other | Admitting: Primary Care

## 2023-07-15 ENCOUNTER — Encounter: Payer: Self-pay | Admitting: Gastroenterology

## 2023-07-15 LAB — SURGICAL PATHOLOGY

## 2023-07-16 ENCOUNTER — Ambulatory Visit: Payer: 59 | Admitting: Physical Therapy

## 2023-07-21 ENCOUNTER — Ambulatory Visit: Payer: 59 | Attending: Neurology | Admitting: Physical Therapy

## 2023-07-21 ENCOUNTER — Ambulatory Visit (INDEPENDENT_AMBULATORY_CARE_PROVIDER_SITE_OTHER): Payer: Self-pay | Admitting: Primary Care

## 2023-07-21 VITALS — BP 168/112 | HR 99

## 2023-07-21 DIAGNOSIS — R269 Unspecified abnormalities of gait and mobility: Secondary | ICD-10-CM | POA: Insufficient documentation

## 2023-07-21 DIAGNOSIS — F40298 Other specified phobia: Secondary | ICD-10-CM | POA: Insufficient documentation

## 2023-07-21 NOTE — Therapy (Signed)
 [facility] [address] [place], [place], [address] Phone: [phone]   Fax:  [phone]  Patient Details  Name: Casey Warren MRN: [id number] Date of Birth: 01/28/1961 Referring Provider:  Cassandra Cleveland, MD  Encounter Date: 07/21/2023  Session was arrive no charge. Patient reports waking up about an hour ago and taking BP medication. Per chart review, patient has history of elevated BP readings. Advised follow up with PCP for management before returning to PT in order to be in safe therapeutic range for therapy. Educated on going to ED if patient becomes symptomatic.      07/21/2023    2:15 PM 07/21/2023    2:07 PM 07/21/2023    2:05 PM  Vitals with BMI  Systolic 168 166 742  Diastolic 112 112 595  Pulse  99 95     Coreen Devoid, PT, DPT 07/21/2023, 2:24 PM  [facility] [address] [place], [place], [address] Phone: [phone]   Fax:  [phone]

## 2023-07-21 NOTE — Telephone Encounter (Signed)
 Copied from CRM [id number]. Topic: Clinical - Red Word Triage >> Jul 21, 2023  2:32 PM Juliana Ocean wrote: Red Word that prompted transfer to Nurse Triage: pt's wife states pt called her from his Neuro PT appt to make an appt w/ his dr.  His BP was 168/112 and 165/119.  Pt is non symptomatic. Pt just got home.   Chief Complaint: elevated BP Symptoms: elevated BP Frequency: sudden Pertinent Negatives: Patient denies blurred vision, chest pain, difficulty breathing, headache, weakness, numbness/tingling Disposition: [] 911 / [] ED /[] Urgent Care (no appt availability in office) / [] Appointment(In office/virtual)/ []  [place] [facility]/ [] Home Care/ [x] Refused Recommended Disposition /[] [facility]/ []  Follow-up with PCP Additional Notes: Pt reporting was just at neuro PT appt and they took his BP 5 min apart to readings of 168/112 and 165/119 within the hour. Pt denies all cardiac/neuro symptoms. Pt confirms taking BP med for HTN on time with no missed doses. Wife now on phone, pt looking for other med bottles. Advised pt be examined in next 4 hours, no availability with PCP office today, offered to schedule with another office, wife stating that "he won't see no one else, he wants his doctor." Advised that nurse would send HP message to PCP office for call back, advised take to UC if no call back in next couple hours, call back if symptoms emerge. Wife verbalized understanding.  Reason for Disposition  [1] Systolic BP  >= 200 OR Diastolic >= 120 AND [2] having NO cardiac or neurologic symptoms  Answer Assessment - Initial Assessment Questions 1. BLOOD PRESSURE: "What is the blood pressure?" "Did you take at least two measurements 5 minutes apart?"     168/112 and 165/119 with 5 min apart, neuro PT office 2. ONSET: "When did you take your blood pressure?"     Within the hour 3. HOW: "How did you take your blood pressure?" (e.g., automatic home BP monitor, visiting nurse)     In-office  cuff, manual 4. HISTORY: "Do you have a history of high blood pressure?"     yes 5. MEDICINES: "Are you taking any medicines for blood pressure?" "Have you missed any doses recently?"     Yes, 3 meds, don't know off top of head, no missed doses, takes every morning. Wife listing Loratadine  10 mg, atorvasatin 20 mg, and escitalopram  20 mg 6. OTHER SYMPTOMS: "Do you have any symptoms?" (e.g., blurred vision, chest pain, difficulty breathing, headache, weakness)     denies  Protocols used: Blood Pressure - High-A-AH

## 2023-07-26 ENCOUNTER — Other Ambulatory Visit (INDEPENDENT_AMBULATORY_CARE_PROVIDER_SITE_OTHER): Payer: Self-pay | Admitting: Primary Care

## 2023-07-26 DIAGNOSIS — E782 Mixed hyperlipidemia: Secondary | ICD-10-CM

## 2023-07-26 DIAGNOSIS — Z760 Encounter for issue of repeat prescription: Secondary | ICD-10-CM

## 2023-07-26 DIAGNOSIS — F418 Other specified anxiety disorders: Secondary | ICD-10-CM

## 2023-07-28 NOTE — Telephone Encounter (Signed)
 Requested by interface surescripts. Last refill documented 06/12/23 #90. Too soon for refills future appt tomorrow.  Requested Prescriptions  Refused Prescriptions Disp Refills   atorvastatin (LIPITOR) 20 MG tablet [Pharmacy Med Name: ATORVASTATIN 20 MG TABLET] 90 tablet 0    Sig: TAKE 1 TABLET BY MOUTH EVERY DAY     Cardiovascular:  Antilipid - Statins Failed - 07/28/2023  1:50 PM      Failed - Lipid Panel in normal range within the last 12 months    Cholesterol, Total  Date Value Ref Range Status  02/13/2023 125 100 - 199 mg/dL Final   LDL Chol Calc (NIH)  Date Value Ref Range Status  02/13/2023 44 0 - 99 mg/dL Final   HDL  Date Value Ref Range Status  02/13/2023 63 >39 mg/dL Final   Triglycerides  Date Value Ref Range Status  02/13/2023 94 0 - 149 mg/dL Final         Passed - Patient is not pregnant      Passed - Valid encounter within last 12 months    Recent Outpatient Visits           4 months ago Hospital discharge follow-up   [place] [facility] Grayce Sessions, NP   5 months ago Medication refill   [place] [facility] Grayce Sessions, NP   6 months ago Essential hypertension   [place] [facility] Grayce Sessions, NP   9 months ago Essential hypertension   [place] [facility] Grayce Sessions, NP   1 year ago Hospital discharge follow-up   [place] [facility] Grayce Sessions, NP       Future Appointments             Tomorrow Grayce Sessions, NP [place] [facility]             valsartan (DIOVAN) 40 MG tablet [Pharmacy Med Name: VALSARTAN 40 MG TABLET] 90 tablet 0    Sig: TAKE 1 TABLET BY MOUTH EVERY DAY     Cardiovascular:  Angiotensin Receptor Blockers Failed - 07/28/2023  1:50 PM      Failed - Last BP in normal range    BP Readings from Last 1 Encounters:  07/21/23 (!) 168/112         Passed - Cr in normal  range and within 180 days    Creatinine, Ser  Date Value Ref Range Status  03/10/2023 0.98 0.61 - 1.24 mg/dL Final         Passed - K in normal range and within 180 days    Potassium  Date Value Ref Range Status  03/10/2023 4.0 3.5 - 5.1 mmol/L Final         Passed - Patient is not pregnant      Passed - Valid encounter within last 6 months    Recent Outpatient Visits           4 months ago Hospital discharge follow-up   [place] [facility] Grayce Sessions, NP   5 months ago Medication refill   [place] [facility] Grayce Sessions, NP   6 months ago Essential hypertension   [place] [facility] Grayce Sessions, NP   9 months ago Essential hypertension   [place] [facility] Grayce Sessions, NP   1 year ago Hospital discharge follow-up   [place] [facility] Randa Evens,  Kinnie Scales, NP       Future Appointments             Tomorrow Grayce Sessions, NP [place] [facility]             escitalopram (LEXAPRO) 20 MG tablet [Pharmacy Med Name: ESCITALOPRAM 20 MG TABLET] 90 tablet 0    Sig: TAKE 1 TABLET BY MOUTH EVERY DAY     Psychiatry:  Antidepressants - SSRI Passed - 07/28/2023  1:50 PM      Passed - Valid encounter within last 6 months    Recent Outpatient Visits           4 months ago Hospital discharge follow-up   [place] [facility] Grayce Sessions, NP   5 months ago Medication refill   [place] [facility] Grayce Sessions, NP   6 months ago Essential hypertension   [place] [facility] Grayce Sessions, NP   9 months ago Essential hypertension   [place] [facility] Grayce Sessions, NP   1 year ago Hospital discharge follow-up   [place] [facility] Grayce Sessions, NP       Future Appointments              Tomorrow Grayce Sessions, NP [place] [facility]

## 2023-07-29 ENCOUNTER — Encounter (INDEPENDENT_AMBULATORY_CARE_PROVIDER_SITE_OTHER): Payer: Self-pay | Admitting: Primary Care

## 2023-07-29 ENCOUNTER — Ambulatory Visit (INDEPENDENT_AMBULATORY_CARE_PROVIDER_SITE_OTHER): Payer: 59 | Admitting: Primary Care

## 2023-07-29 VITALS — BP 157/108 | HR 88 | Resp 16

## 2023-07-29 DIAGNOSIS — F418 Other specified anxiety disorders: Secondary | ICD-10-CM

## 2023-07-29 DIAGNOSIS — Z760 Encounter for issue of repeat prescription: Secondary | ICD-10-CM | POA: Diagnosis not present

## 2023-07-29 DIAGNOSIS — E782 Mixed hyperlipidemia: Secondary | ICD-10-CM

## 2023-07-29 DIAGNOSIS — I10 Essential (primary) hypertension: Secondary | ICD-10-CM | POA: Diagnosis not present

## 2023-07-29 MED ORDER — ESCITALOPRAM OXALATE 20 MG PO TABS: 20.0000 mg | ORAL_TABLET | Freq: Every day | ORAL | 1 refills | Status: DC

## 2023-07-29 MED ORDER — VALSARTAN 40 MG PO TABS
40.0000 mg | ORAL_TABLET | Freq: Every day | ORAL | 1 refills | Status: DC
Start: 2023-07-29 — End: 2024-03-16

## 2023-07-29 MED ORDER — HYDROCHLOROTHIAZIDE 12.5 MG PO CAPS: 12.5000 mg | ORAL_CAPSULE | Freq: Every day | ORAL | 0 refills | Status: DC

## 2023-07-29 NOTE — Progress Notes (Signed)
 [facility]  Casey Warren, is a 63 y.o. male  WUJ:[id number]  [id number]:[id number]  DOB - [birth year]  Chief Complaint  Patient presents with   Hypertension       Subjective:   Casey Warren is a 63 y.o. male here today for a follow up visit hypertension.  Patient has No headache, No chest pain, No abdominal pain - No Nausea, No new weakness tingling or numbness, No Cough - shortness of breath.  Patient was supposed to start physical therapy for bilateral lower extremity weakness however due to elevated blood pressure unable to be seen.  Today his blood pressure is also elevated at 157/108 states he takes his blood pressure medication every day.  Reviewing refills question was he actually taking all his medication."  I did take my medication every day" he states he has been out of medication although there are refills on the bottle.  Will add low-dose HCTZ recheck BP in 2 weeks on all medication    Comprehensive ROS Pertinent positive and negative noted in HPI   Allergies  Allergen Reactions   Citrus Swelling   Fruit Extracts Swelling    Pt experiences swelling with most fruits   Chlorthalidone Other (See Comments)    Hyponatremia    Past Medical History:  Diagnosis Date   Angio-edema    Anxiety    related to surgery   Asthma    GERD (gastroesophageal reflux disease)    History of blood transfusion    HTN (hypertension)    Seizures (HCC)    last seizure in 2023 per patient   Ulcer     Current Outpatient Medications on File Prior to Visit  Medication Sig Dispense Refill   albuterol (VENTOLIN HFA) 108 (90 Base) MCG/ACT inhaler Inhale 2 puffs into the lungs every 6 (six) hours as needed for wheezing or shortness of breath. 18 g 2   amLODipine (NORVASC) 10 MG tablet Take 1 tablet (10 mg total) by mouth daily. (Patient not taking: Reported on 07/07/2023) 90 tablet 3   aspirin EC 81 MG tablet Take 81 mg by mouth daily.     atorvastatin (LIPITOR) 20 MG tablet Take 1  tablet (20 mg total) by mouth daily. 90 tablet 0   EPINEPHrine 0.3 mg/0.3 mL IJ SOAJ injection Inject 0.3 mg into the muscle as needed for anaphylaxis. (Patient not taking: Reported on 07/07/2023) 1 each 2   famotidine (PEPCID) 20 MG tablet Take 1 tablet (20 mg total) by mouth 2 (two) times daily for 7 days. 14 tablet 0   guaiFENesin 200 MG tablet Take 1 tablet (200 mg total) by mouth every 4 (four) hours as needed for cough or to loosen phlegm. (Patient not taking: Reported on 07/07/2023) 30 suppository 1   loratadine (CLARITIN) 10 MG tablet Take 1 tablet (10 mg total) by mouth daily as needed for allergies. 90 tablet 0   No current facility-administered medications on file prior to visit.   Health Maintenance  Topic Date Due   Medicare Annual Wellness Visit  Never done   Pneumonia Vaccine (1 of 2 - PCV) Never done   Zoster (Shingles) Vaccine (2 of 2) 12/18/2022   COVID-19 Vaccine (1 - 2024-25 season) Never done   Flu Shot  09/08/2023*   Stool Blood Test  07/09/2024   DTaP/Tdap/Td vaccine (2 - Td or Tdap) 03/11/2029   Hepatitis C Screening  Completed   HIV Screening  Completed   HPV Vaccine  Aged Out   Colon Cancer Screening  Discontinued  *Topic was postponed. The date shown is not the original due date.    Objective:   Vitals:   07/29/23 1234 07/29/23 1235  BP: (!) 173/119 (!) 157/108  Pulse: 88   Resp: 16   SpO2: 95%    BP Readings from Last 3 Encounters:  07/29/23 (!) 157/108  07/21/23 (!) 168/112  07/10/23 (!) 153/98      Physical Exam Vitals reviewed.  HENT:     Head: Normocephalic.     Comments: Poor dentition     Right Ear: External ear normal.     Left Ear: External ear normal.     Nose: Nose normal.  Eyes:     Extraocular Movements: Extraocular movements intact.  Cardiovascular:     Rate and Rhythm: Normal rate and regular rhythm.  Pulmonary:     Effort: Pulmonary effort is normal.     Breath sounds: Normal breath sounds.  Abdominal:     General: Bowel  sounds are normal.     Palpations: Abdomen is soft.  Musculoskeletal:     Comments: LE weakness in needs feels as though they will give out on him  Skin:    General: Skin is warm and dry.  Neurological:     Mental Status: He is alert and oriented to person, place, and time.  Psychiatric:        Mood and Affect: Mood normal.        Behavior: Behavior normal.      Assessment & Plan  Tarrance was seen today for hypertension.  Diagnoses and all orders for this visit:  Essential hypertension BP goal - < 140/90Explained that having normal blood pressure is the goal and medications are helping to get to goal and maintain normal blood pressure. DIET: Limit salt intake, read nutrition labels to check salt content, limit fried and high fatty foods  Avoid using multisymptom OTC cold preparations that generally contain sudafed which can rise BP. Consult with pharmacist on best cold relief products to use for persons with HTN EXERCISE Discussed incorporating exercise such as walking - 30 minutes most days    [name] was seen today for hypertension. Blood pressure remains elevated will add hydrochlorothiazide 12.5 repeat blood pressure.  Will continue taking Darvon 40 mg and amlodipine 10mg .  Medication refill -     valsartan (DIOVAN) 40 MG tablet; Take 1 tablet (40 mg total) by mouth daily. -     escitalopram (LEXAPRO) 20 MG tablet; Take 1 tablet (20 mg total) by mouth daily.  Anxiety with depression Patient states he has been doing well on his medication no thoughts of harm to self or others no auditory or visual hallucinations.  Just doing fine -     escitalopram (LEXAPRO) 20 MG tablet; Take 1 tablet (20 mg total) by mouth daily.  Patient have been counseled extensively about nutrition and exercise. Other issues discussed during this visit include: low cholesterol diet, weight control and daily exercise, foot care, annual eye examinations at Ophthalmology, importance of adherence with  medications and regular follow-up. We also discussed long term complications of uncontrolled diabetes and hypertension.   Return in about 3 months (around 10/26/2023) for medical conditions.  The patient was given clear instructions to go to ER or return to medical center if symptoms don't improve, worsen or new problems develop. The patient verbalized understanding. The patient was told to call to get lab results if they haven't heard anything in the next week.   This note has  been created with Education officer, environmental. Any transcriptional errors are unintentional.   Grayce Sessions, NP 07/29/2023, 1:20 PM

## 2023-07-30 LAB — CBC WITH DIFFERENTIAL/PLATELET
Basophils Absolute: 0.1 10*3/uL (ref 0.0–0.2)
Basos: 1 %
EOS (ABSOLUTE): 0.2 10*3/uL (ref 0.0–0.4)
Eos: 4 %
Hematocrit: 50.4 % (ref 37.5–51.0)
Hemoglobin: 17.4 g/dL (ref 13.0–17.7)
Immature Grans (Abs): 0 10*3/uL (ref 0.0–0.1)
Immature Granulocytes: 0 %
Lymphocytes Absolute: 2 10*3/uL (ref 0.7–3.1)
Lymphs: 32 %
MCH: 32 pg (ref 26.6–33.0)
MCHC: 34.5 g/dL (ref 31.5–35.7)
MCV: 93 fL (ref 79–97)
Monocytes Absolute: 0.6 10*3/uL (ref 0.1–0.9)
Monocytes: 10 %
Neutrophils Absolute: 3.3 10*3/uL (ref 1.4–7.0)
Neutrophils: 53 %
Platelets: 292 10*3/uL (ref 150–450)
RBC: 5.43 x10E6/uL (ref 4.14–5.80)
RDW: 13.2 % (ref 11.6–15.4)
WBC: 6.2 10*3/uL (ref 3.4–10.8)

## 2023-07-30 LAB — CMP14+EGFR
ALT: 15 [IU]/L (ref 0–44)
AST: 17 [IU]/L (ref 0–40)
Albumin: 4.3 g/dL (ref 3.9–4.9)
Alkaline Phosphatase: 116 [IU]/L (ref 44–121)
BUN/Creatinine Ratio: 7 — ABNORMAL LOW (ref 10–24)
BUN: 6 mg/dL — ABNORMAL LOW (ref 8–27)
Bilirubin Total: 0.4 mg/dL (ref 0.0–1.2)
CO2: 24 mmol/L (ref 20–29)
Calcium: 9.1 mg/dL (ref 8.6–10.2)
Chloride: 99 mmol/L (ref 96–106)
Creatinine, Ser: 0.92 mg/dL (ref 0.76–1.27)
Globulin, Total: 2.7 g/dL (ref 1.5–4.5)
Glucose: 92 mg/dL (ref 70–99)
Potassium: 4.3 mmol/L (ref 3.5–5.2)
Sodium: 140 mmol/L (ref 134–144)
Total Protein: 7 g/dL (ref 6.0–8.5)
eGFR: 92 mL/min/{1.73_m2} (ref >59)

## 2023-07-30 LAB — LIPID PANEL
Chol/HDL Ratio: 3.4 {ratio} (ref 0.0–5.0)
Cholesterol, Total: 131 mg/dL (ref 100–199)
HDL: 38 mg/dL — ABNORMAL LOW (ref >39)
LDL Chol Calc (NIH): 75 mg/dL (ref 0–99)
Triglycerides: 94 mg/dL (ref 0–149)
VLDL Cholesterol Cal: 18 mg/dL (ref 5–40)

## 2023-08-02 MED ORDER — CLONIDINE HCL 0.1 MG PO TABS: 0.1000 mg | ORAL_TABLET | Freq: Once | ORAL | Status: DC

## 2023-08-05 ENCOUNTER — Other Ambulatory Visit (INDEPENDENT_AMBULATORY_CARE_PROVIDER_SITE_OTHER): Payer: Self-pay | Admitting: Primary Care

## 2023-08-05 NOTE — Telephone Encounter (Signed)
 Copied from CRM [id number]. Topic: Clinical - Medication Refill >> Aug 05, 2023  2:08 PM Gery Pray wrote: Most Recent Primary Care Visit:  Provider: Grayce Sessions  Department: [facility]  Visit Type: OFFICE VISIT  Date: 07/29/2023  Medication: EPINEPHrine 0.3 mg/0.3 mL IJ SOAJ injection  Has the patient contacted their pharmacy? Yes (Agent: If no, request that the patient contact the pharmacy for the refill. If patient does not wish to contact the pharmacy document the reason why and proceed with request.) (Agent: If yes, when and what did the pharmacy advise?)  Is this the correct pharmacy for this prescription? Yes If no, delete pharmacy and type the correct one.  This is the patient's preferred pharmacy:  [id number] [place], [place] - [address] AT [facility] [address] [place] [address] Phone: [phone] Fax: [phone]  [facility] - [place], [place] - [address] [place] [address] Phone: [phone] Fax: [phone]   Has the prescription been filled recently? No  Is the patient out of the medication? Yes  Has the patient been seen for an appointment in the last year OR does the patient have an upcoming appointment? Yes  Can we respond through MyChart? Yes  Agent: Please be advised that Rx refills may take up to 3 business days. We ask that you follow-up with your pharmacy.

## 2023-08-08 ENCOUNTER — Encounter (HOSPITAL_COMMUNITY): Payer: Self-pay

## 2023-08-08 ENCOUNTER — Telehealth (INDEPENDENT_AMBULATORY_CARE_PROVIDER_SITE_OTHER): Payer: Self-pay | Admitting: Primary Care

## 2023-08-08 ENCOUNTER — Emergency Department (HOSPITAL_COMMUNITY)
Admission: EM | Admit: 2023-08-08 | Discharge: 2023-08-09 | Disposition: A | Discharge status: Home / Self Care | Payer: 59 | Attending: Emergency Medicine | Admitting: Emergency Medicine

## 2023-08-08 ENCOUNTER — Emergency Department (HOSPITAL_COMMUNITY): Payer: 59

## 2023-08-08 ENCOUNTER — Telehealth: Payer: Self-pay

## 2023-08-08 DIAGNOSIS — I6523 Occlusion and stenosis of bilateral carotid arteries: Secondary | ICD-10-CM | POA: Diagnosis not present

## 2023-08-08 DIAGNOSIS — Z79899 Other long term (current) drug therapy: Secondary | ICD-10-CM | POA: Insufficient documentation

## 2023-08-08 DIAGNOSIS — Z7982 Long term (current) use of aspirin: Secondary | ICD-10-CM | POA: Diagnosis not present

## 2023-08-08 DIAGNOSIS — R519 Headache, unspecified: Secondary | ICD-10-CM | POA: Diagnosis not present

## 2023-08-08 DIAGNOSIS — F17290 Nicotine dependence, other tobacco product, uncomplicated: Secondary | ICD-10-CM | POA: Insufficient documentation

## 2023-08-08 DIAGNOSIS — I672 Cerebral atherosclerosis: Secondary | ICD-10-CM | POA: Diagnosis not present

## 2023-08-08 DIAGNOSIS — I10 Essential (primary) hypertension: Secondary | ICD-10-CM | POA: Diagnosis not present

## 2023-08-08 DIAGNOSIS — Z743 Need for continuous supervision: Secondary | ICD-10-CM | POA: Diagnosis not present

## 2023-08-08 DIAGNOSIS — F17210 Nicotine dependence, cigarettes, uncomplicated: Secondary | ICD-10-CM | POA: Diagnosis not present

## 2023-08-08 DIAGNOSIS — J45909 Unspecified asthma, uncomplicated: Secondary | ICD-10-CM | POA: Insufficient documentation

## 2023-08-08 DIAGNOSIS — I77810 Thoracic aortic ectasia: Secondary | ICD-10-CM | POA: Diagnosis not present

## 2023-08-08 DIAGNOSIS — R0902 Hypoxemia: Secondary | ICD-10-CM | POA: Diagnosis not present

## 2023-08-08 DIAGNOSIS — G4489 Other headache syndrome: Secondary | ICD-10-CM | POA: Diagnosis not present

## 2023-08-08 LAB — CBC WITH DIFFERENTIAL/PLATELET
Abs Immature Granulocytes: 0.03 10*3/uL (ref 0.00–0.07)
Basophils Absolute: 0.1 10*3/uL (ref 0.0–0.1)
Basophils Relative: 1 %
Eosinophils Absolute: 0.1 10*3/uL (ref 0.0–0.5)
Eosinophils Relative: 1 %
HCT: 49 % (ref 39.0–52.0)
Hemoglobin: 17.5 g/dL — ABNORMAL HIGH (ref 13.0–17.0)
Immature Granulocytes: 0 %
Lymphocytes Relative: 13 %
Lymphs Abs: 1.2 10*3/uL (ref 0.7–4.0)
MCH: 31.8 pg (ref 26.0–34.0)
MCHC: 35.7 g/dL (ref 30.0–36.0)
MCV: 88.9 fL (ref 80.0–100.0)
Monocytes Absolute: 0.3 10*3/uL (ref 0.1–1.0)
Monocytes Relative: 4 %
Neutro Abs: 7.5 10*3/uL (ref 1.7–7.7)
Neutrophils Relative %: 81 %
Platelets: 306 10*3/uL (ref 150–400)
RBC: 5.51 MIL/uL (ref 4.22–5.81)
RDW: 12.9 % (ref 11.5–15.5)
WBC: 9.1 10*3/uL (ref 4.0–10.5)
nRBC: 0 % (ref 0.0–0.2)

## 2023-08-08 LAB — COMPREHENSIVE METABOLIC PANEL
ALT: 19 U/L (ref 0–44)
AST: 19 U/L (ref 15–41)
Albumin: 3.9 g/dL (ref 3.5–5.0)
Alkaline Phosphatase: 82 U/L (ref 38–126)
Anion gap: 14 (ref 5–15)
BUN: 6 mg/dL — ABNORMAL LOW (ref 8–23)
CO2: 21 mmol/L — ABNORMAL LOW (ref 22–32)
Calcium: 9.2 mg/dL (ref 8.9–10.3)
Chloride: 99 mmol/L (ref 98–111)
Creatinine, Ser: 0.85 mg/dL (ref 0.61–1.24)
GFR, Estimated: >60 mL/min (ref >60)
Glucose, Bld: 107 mg/dL — ABNORMAL HIGH (ref 70–99)
Potassium: 3.6 mmol/L (ref 3.5–5.1)
Sodium: 134 mmol/L — ABNORMAL LOW (ref 135–145)
Total Bilirubin: 0.8 mg/dL (ref 0.0–1.2)
Total Protein: 7.3 g/dL (ref 6.5–8.1)

## 2023-08-08 LAB — ETHANOL: Alcohol, Ethyl (B): <10 mg/dL (ref <10)

## 2023-08-08 NOTE — Telephone Encounter (Signed)
 Copied from CRM [id number]. Topic: Clinical - Medication Refill >> Aug 05, 2023  2:08 PM Gery Pray wrote: Most Recent Primary Care Visit:  Provider: Grayce Sessions  Department: [facility]  Visit Type: OFFICE VISIT  Date: 07/29/2023  Medication: EPINEPHrine 0.3 mg/0.3 mL IJ SOAJ injection  Has the patient contacted their pharmacy? Yes (Agent: If no, request that the patient contact the pharmacy for the refill. If patient does not wish to contact the pharmacy document the reason why and proceed with request.) (Agent: If yes, when and what did the pharmacy advise?)  Is this the correct pharmacy for this prescription? Yes If no, delete pharmacy and type the correct one.  This is the patient's preferred pharmacy:  [id number] [place], [place] - [address] AT [facility] [address] [place] [address] Phone: [phone] Fax: [phone]  [facility] - [place], [place] - [address] [place] [address] Phone: [phone] Fax: [phone]   Has the prescription been filled recently? No  Is the patient out of the medication? Yes  Has the patient been seen for an appointment in the last year OR does the patient have an upcoming appointment? Yes  Can we respond through MyChart? Yes  Agent: Please be advised that Rx refills may take up to 3 business days. We ask that you follow-up with your pharmacy.

## 2023-08-08 NOTE — Telephone Encounter (Signed)
 Spoke with patient Casey Warren(DPR verified), Advised that they need to contact Alfonse Spruce, MD [address] [place] [address] Phone: [phone]   Fax: [phone]   For refills on EPINEPHrine  Contact information given

## 2023-08-08 NOTE — Telephone Encounter (Unsigned)
 Copied from CRM [id number]. Topic: Clinical - Medication Question >> Aug 08, 2023 12:11 PM Zayanah H wrote: Reason for CRM: patient has been taking hydrochlorothiazide (MICROZIDE) 12.5 MG capsule and it has elevated his blood pressure today reading 168/107, when patient was on valsartan (DIOVAN) 40 MG tablet his blood pressure was in normal range.[phone]. Patient wants to know if he can take valsartan (DIOVAN) 40 MG tablet

## 2023-08-08 NOTE — ED Provider Triage Note (Signed)
 Emergency Medicine Provider Triage Evaluation Note  Casey Warren , a 63 y.o. male  was evaluated in triage.  Pt complains of headache x8+hours. States that has tried OTC meds w/o relief. Denies any head trauma or fever.  Review of Systems  Positive: headache Negative: fever  Physical Exam  BP (!) 168/100 (BP Location: Right Arm)   Pulse 76   Temp 98.6 F (37 C)   Resp 19   SpO2 100%  Gen:   Awake, no distress   Resp:  Normal effort  MSK:   Moves extremities without difficulty  Other:  Repetitive statements, no other neurodeficits on exam  Medical Decision Making  Medically screening exam initiated at 8:19 PM.  Appropriate orders placed.  SEDDRICK FLAX was informed that the remainder of the evaluation will be completed by another provider, this initial triage assessment does not replace that evaluation, and the importance of remaining in the ED until their evaluation is complete.     Pete Pelt, [place] 08/08/23 2020

## 2023-08-08 NOTE — ED Triage Notes (Signed)
 Pt has had a headache that has no improved over the last 8hrs and he mentions its just getting worse. He HTN is uncontrolled and it has been also increasing with his headache. He has been having poor water intake. No blood thinners. No neuro deficits, no weakness, no numbness. He is alert and able to answer all questions. No chest pain and no shortness breath. No OTC meds working  Medic vitals   164/92 73hr 18rr 98%ra 97bgl

## 2023-08-08 NOTE — ED Notes (Signed)
 Pt unable to provide urine at this time

## 2023-08-09 ENCOUNTER — Emergency Department (HOSPITAL_COMMUNITY)

## 2023-08-09 DIAGNOSIS — I672 Cerebral atherosclerosis: Secondary | ICD-10-CM | POA: Diagnosis not present

## 2023-08-09 DIAGNOSIS — I6523 Occlusion and stenosis of bilateral carotid arteries: Secondary | ICD-10-CM | POA: Diagnosis not present

## 2023-08-09 DIAGNOSIS — I77810 Thoracic aortic ectasia: Secondary | ICD-10-CM | POA: Diagnosis not present

## 2023-08-09 DIAGNOSIS — I10 Essential (primary) hypertension: Secondary | ICD-10-CM | POA: Diagnosis not present

## 2023-08-09 DIAGNOSIS — R519 Headache, unspecified: Secondary | ICD-10-CM | POA: Diagnosis not present

## 2023-08-09 LAB — RAPID URINE DRUG SCREEN, HOSP PERFORMED
Amphetamines: NOT DETECTED
Barbiturates: NOT DETECTED
Benzodiazepines: NOT DETECTED
Cocaine: NOT DETECTED
Opiates: NOT DETECTED
Tetrahydrocannabinol: NOT DETECTED

## 2023-08-09 MED ORDER — PROCHLORPERAZINE EDISYLATE 10 MG/2ML IJ SOLN
10.0000 mg | Freq: Once | INTRAMUSCULAR | Status: AC
  Administered 2023-08-09: 10 mg via INTRAVENOUS
  Filled 2023-08-09: qty 2

## 2023-08-09 MED ORDER — LABETALOL HCL 5 MG/ML IV SOLN
10.0000 mg | Freq: Once | INTRAVENOUS | Status: AC
  Administered 2023-08-09: 10 mg via INTRAVENOUS
  Filled 2023-08-09: qty 4

## 2023-08-09 MED ORDER — DIPHENHYDRAMINE HCL 50 MG/ML IJ SOLN
25.0000 mg | Freq: Once | INTRAMUSCULAR | Status: AC
Start: 2023-08-09 — End: 2023-08-09
  Administered 2023-08-09: 25 mg via INTRAVENOUS
  Filled 2023-08-09: qty 1

## 2023-08-09 MED ORDER — IOHEXOL 350 MG/ML SOLN
75.0000 mL | Freq: Once | INTRAVENOUS | Status: AC | PRN
  Administered 2023-08-09: 75 mL via INTRAVENOUS

## 2023-08-09 NOTE — ED Notes (Signed)
 Patient transported to CT scan .

## 2023-08-09 NOTE — ED Notes (Signed)
 Unable to give urine specimen at this time .

## 2023-08-09 NOTE — ED Provider Notes (Signed)
 [facility] Emergency Department Provider Note MRN:  [id number]  Arrival date & time: 08/09/23     Chief Complaint   Headache   History of Present Illness   Casey Warren is a 63 y.o. year-old male with a history of hypertension, seizure, GERD presenting to the ED with chief complaint of headache.  Severe sudden onset headache about 10 to 12 hours ago, woke him from sleep.  Global headache, not going away.  Associated with nausea.  Denies numbness or weakness to the arms or legs, no neck pain, no chest pain, no shortness of breath, no abdominal pain.  Review of Systems  A thorough review of systems was obtained and all systems are negative except as noted in the HPI and PMH.   Patient's Health History    Past Medical History:  Diagnosis Date   Angio-edema    Anxiety    related to surgery   Asthma    GERD (gastroesophageal reflux disease)    History of blood transfusion    HTN (hypertension)    Seizures (HCC)    last seizure in 2023 per patient   Ulcer     Past Surgical History:  Procedure Laterality Date   CLOSED REDUCTION NASAL FRACTURE  11/19/2011   Procedure: CLOSED REDUCTION NASAL FRACTURE;  Surgeon: Flo Shanks, MD;  Location: [facility] OR;  Service: ENT;  Laterality: N/A;  CLOSED REDUCTION NASAL AND NASAL SEPTAL FRACTURE WITH STABILIZATION   No surgical      Family History  Problem Relation Age of Onset   Stroke Mother        Died age 23   Hypertension Mother    Diabetes Mother    Breast cancer Mother    Leukemia Father        Died age 47   Allergic rhinitis Sister    Asthma Grandson    Colon cancer Neg Hx     Social History   Socioeconomic History   Marital status: Married    Spouse name: Not on file   Number of children: 3   Years of education: Not on file   Highest education level: Not on file  Occupational History   Not on file  Tobacco Use   Smoking status: Every Day    Current packs/day: 0.50    Types: Cigars, Cigarettes     Passive exposure: Never   Smokeless tobacco: Never   Tobacco comments:    2 -3 black-n-milds a day   Vaping Use   Vaping status: Never Used  Substance and Sexual Activity   Alcohol use: Yes    Comment: Two cans of beer dialy.    Drug use: No   Sexual activity: Not on file  Other Topics Concern   Not on file  Social History Narrative   Lives in 1 story home   Pt and wife live with their daughter, her husband and their 3 children   12 grade education   Works as Tour manager   Social Drivers of Corporate investment banker Strain: Not on file  Food Insecurity: Not on file  Transportation Needs: Not on file  Physical Activity: Not on file  Stress: Not on file  Social Connections: Not on file  Intimate Partner Violence: Not on file     Physical Exam   Vitals:   08/09/23 0326 08/09/23 0430  BP: (!) 127/94 (!) 124/94  Pulse: 98 85  Resp: 20   Temp:    SpO2: 96% 100%  CONSTITUTIONAL: Well-appearing, NAD NEURO/PSYCH:  Alert and oriented x 3, normal and symmetric strength and sensation, normal coordination, normal speech EYES:  eyes equal and reactive ENT/NECK:  no LAD, no JVD CARDIO: Regular rate, well-perfused, normal S1 and S2 PULM:  CTAB no wheezing or rhonchi GI/GU:  non-distended, non-tender MSK/SPINE:  No gross deformities, no edema SKIN:  no rash, atraumatic   *Additional and/or pertinent findings included in MDM below  Diagnostic and Interventional Summary    EKG Interpretation Date/Time:    Ventricular Rate:    PR Interval:    QRS Duration:    QT Interval:    QTC Calculation:   R Axis:      Text Interpretation:         Labs Reviewed  CBC WITH DIFFERENTIAL/PLATELET - Abnormal; Notable for the following components:      Result Value   Hemoglobin 17.5 (*)    All other components within normal limits  COMPREHENSIVE METABOLIC PANEL - Abnormal; Notable for the following components:   Sodium 134 (*)    CO2 21 (*)    Glucose,  Bld 107 (*)    BUN 6 (*)    All other components within normal limits  ETHANOL  RAPID URINE DRUG SCREEN, HOSP PERFORMED    CT ANGIO HEAD NECK W WO CM  Final Result    CT Head Wo Contrast  Final Result      Medications  prochlorperazine (COMPAZINE) injection 10 mg (10 mg Intravenous Given 08/09/23 0047)  diphenhydrAMINE (BENADRYL) injection 25 mg (25 mg Intravenous Given 08/09/23 0048)  labetalol (NORMODYNE) injection 10 mg (10 mg Intravenous Given 08/09/23 0047)  iohexol (OMNIPAQUE) 350 MG/ML injection 75 mL (75 mLs Intravenous Contrast Given 08/09/23 0212)     Procedures  /  Critical Care Procedures  ED Course and Medical Decision Making  Initial Impression and Ddx Sudden onset headache, patient seems quite uncomfortable.  Hypertensive on arrival.  Differential diagnosis includes subarachnoid hemorrhage, hypertensive bleed, hypertensive urgency or emergency.  Past medical/surgical history that increases complexity of ED encounter: Hypertension  Interpretation of Diagnostics I personally reviewed the Laboratory Testing and my interpretation is as follows: No significant blood count or electrolyte disturbance.  CT imaging revealing some atherosclerotic disease but no acute bleeding.  Patient Reassessment and Ultimate Disposition/Management     On reassessment blood pressure is much improved, patient is resting comfortably, wakes easily, his headache is resolved, he currently has no symptoms.  No real indication for further testing or admission, patient is appropriate for discharge.  Patient management required discussion with the following services or consulting groups:  None  Complexity of Problems Addressed Acute illness or injury that poses threat of life of bodily function  Additional Data Reviewed and Analyzed Further history obtained from: Further history from spouse/family member  Additional Factors Impacting ED Encounter Risk Consideration of hospitalization  Elmer Sow. Pilar Plate, MD [facility] Emergency Medicine [facility] [email or web address]  Final Clinical Impressions(s) / ED Diagnoses     ICD-10-CM   1. Bad headache  R51.9 Ambulatory referral to Neurology    2. Hypertension, unspecified type  I10       ED Discharge Orders          Ordered    Ambulatory referral to Neurology       Comments: An appointment is requested in approximately: 2 weeks   08/09/23 0504             Discharge Instructions Discussed with and Provided to  Patient:     Discharge Instructions      You were evaluated in the Emergency Department and after careful evaluation, we did not find any emergent condition requiring admission or further testing in the hospital.  Your exam/testing today was overall reassuring.  Recommend follow-up with your primary care doctor to discuss your blood pressure control.  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  Recommend a daily baby aspirin and follow-up with the neurologists.  Your CT scan also showed a small pulmonary nodule, please mention this to your primary care doctor, you may need repeat imaging in 1 year as we discussed.  Please return to the Emergency Department if you experience any worsening of your condition.  Thank you for allowing [place] to be a part of your care.        Sabas Sous, MD 08/09/23 [phone]

## 2023-08-09 NOTE — Discharge Instructions (Addendum)
 You were evaluated in the Emergency Department and after careful evaluation, we did not find any emergent condition requiring admission or further testing in the hospital.  Your exam/testing today was overall reassuring.  Recommend follow-up with your primary care doctor to discuss your blood pressure control.  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  Recommend a daily baby aspirin and follow-up with the neurologists.  Your CT scan also showed a small pulmonary nodule, please mention this to your primary care doctor, you may need repeat imaging in 1 year as we discussed.  Please return to the Emergency Department if you experience any worsening of your condition.  Thank you for allowing [place] to be a part of your care.

## 2023-08-10 MED ORDER — EPINEPHRINE 0.3 MG/0.3ML IJ SOAJ: 0.3000 mg | INTRAMUSCULAR | 2 refills | Status: AC | PRN

## 2023-08-12 ENCOUNTER — Encounter (INDEPENDENT_AMBULATORY_CARE_PROVIDER_SITE_OTHER): Payer: Self-pay

## 2023-08-12 ENCOUNTER — Ambulatory Visit (INDEPENDENT_AMBULATORY_CARE_PROVIDER_SITE_OTHER): Payer: 59 | Admitting: Primary Care

## 2023-08-12 DIAGNOSIS — I10 Essential (primary) hypertension: Secondary | ICD-10-CM

## 2023-08-12 DIAGNOSIS — Z09 Encounter for follow-up examination after completed treatment for conditions other than malignant neoplasm: Secondary | ICD-10-CM | POA: Diagnosis not present

## 2023-08-12 NOTE — Patient Instructions (Signed)
 Casey Warren

## 2023-08-12 NOTE — Progress Notes (Unsigned)
Pt is here for BP check.

## 2023-08-12 NOTE — Progress Notes (Unsigned)
Check blood pressure daily at the same time. Keep a log of all blood pressure readings.  DASH DIET; No salt or low sodium diet  If pressure if greater than 150/100 notify me here at the office.  If it's persistently greater 180/90, go to the Emergency Department.  Take all medication as prescribed.   Avoid smoked meats which are high in sodium content.  Avoid soda which contains sodium and are high in sugar which increases your risk for diabetes. 

## 2023-08-14 ENCOUNTER — Encounter: Payer: Self-pay | Admitting: Neurology

## 2023-08-14 ENCOUNTER — Ambulatory Visit (INDEPENDENT_AMBULATORY_CARE_PROVIDER_SITE_OTHER): Admitting: Neurology

## 2023-08-14 VITALS — BP 119/84 | HR 96 | Ht 64.0 in | Wt 161.5 lb

## 2023-08-14 DIAGNOSIS — G44209 Tension-type headache, unspecified, not intractable: Secondary | ICD-10-CM

## 2023-08-14 DIAGNOSIS — R269 Unspecified abnormalities of gait and mobility: Secondary | ICD-10-CM | POA: Diagnosis not present

## 2023-08-14 DIAGNOSIS — F1090 Alcohol use, unspecified, uncomplicated: Secondary | ICD-10-CM

## 2023-08-14 DIAGNOSIS — Z789 Other specified health status: Secondary | ICD-10-CM | POA: Diagnosis not present

## 2023-08-14 MED ORDER — TOPIRAMATE 50 MG PO TABS: 50.0000 mg | ORAL_TABLET | Freq: Two times a day (BID) | ORAL | 3 refills | Status: DC

## 2023-08-14 NOTE — Patient Instructions (Signed)
 Continue current medications Start topiramate 50 mg nightly, 1/2 tablet nightly for the first week then full tablet. Take Aleve, 2 tablets as needed for headaches, please do not take more than 3 days a week Keep a blood pressure diary and follow-up with your PCP Return in 6 months or sooner if worse.
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
## 2023-09-02 ENCOUNTER — Ambulatory Visit (INDEPENDENT_AMBULATORY_CARE_PROVIDER_SITE_OTHER)

## 2023-09-03 ENCOUNTER — Encounter (INDEPENDENT_AMBULATORY_CARE_PROVIDER_SITE_OTHER): Payer: Self-pay

## 2023-09-03 ENCOUNTER — Ambulatory Visit (INDEPENDENT_AMBULATORY_CARE_PROVIDER_SITE_OTHER): Admitting: Primary Care

## 2023-09-03 NOTE — Progress Notes (Signed)
   Blood Pressure Recheck Visit  Name: Casey Warren MRN: [id number] Date of Birth: 07-Oct-1960  Casey Warren presents today for Blood Pressure recheck with clinical support staff.  Order for BP recheck by Dalene Carrow , ordered on 09/03/23.   BP Readings from Last 3 Encounters:  08/14/23 119/84  08/12/23 129/88  08/09/23 (!) 124/94    Current Outpatient Medications  Medication Sig Dispense Refill   albuterol (VENTOLIN HFA) 108 (90 Base) MCG/ACT inhaler Inhale 2 puffs into the lungs every 6 (six) hours as needed for wheezing or shortness of breath. 18 g 2   amLODipine (NORVASC) 10 MG tablet Take 1 tablet (10 mg total) by mouth daily. 90 tablet 3   aspirin EC 81 MG tablet Take 81 mg by mouth daily.     atorvastatin (LIPITOR) 20 MG tablet Take 1 tablet (20 mg total) by mouth daily. 90 tablet 0   EPINEPHrine 0.3 mg/0.3 mL IJ SOAJ injection Inject 0.3 mg into the muscle as needed for anaphylaxis. (Patient not taking: Reported on 08/14/2023) 1 each 2   escitalopram (LEXAPRO) 20 MG tablet Take 1 tablet (20 mg total) by mouth daily. 90 tablet 1   famotidine (PEPCID) 20 MG tablet Take 1 tablet (20 mg total) by mouth 2 (two) times daily for 7 days. 14 tablet 0   guaiFENesin 200 MG tablet Take 1 tablet (200 mg total) by mouth every 4 (four) hours as needed for cough or to loosen phlegm. (Patient not taking: Reported on 08/14/2023) 30 suppository 1   hydrochlorothiazide (MICROZIDE) 12.5 MG capsule Take 1 capsule (12.5 mg total) by mouth daily. 90 capsule 0   loratadine (CLARITIN) 10 MG tablet Take 1 tablet (10 mg total) by mouth daily as needed for allergies. 90 tablet 0   topiramate (TOPAMAX) 50 MG tablet Take 1 tablet (50 mg total) by mouth 2 (two) times daily. 90 tablet 3   valsartan (DIOVAN) 40 MG tablet Take 1 tablet (40 mg total) by mouth daily. 90 tablet 1   Current Facility-Administered Medications  Medication Dose Route Frequency Provider Last Rate Last Admin   cloNIDine (CATAPRES) tablet 0.1  mg  0.1 mg Oral Once         Hypertensive Medication Review: Patient states that they are taking all their hypertensive medications as prescribed and their last dose of hypertensive medications was this morning   Documentation of any medication adherence discrepancies: none  Provider Recommendation:  Spoke to Gwinda Passe, NP  and they stated: Follow up as normal within in 3 months   Patient has been scheduled to follow up with [facility] on 12/04/23   Patient has been given provider's recommendations and does not have any questions or concerns at this time. Patient will contact the office for any future questions or concerns.

## 2023-09-16 ENCOUNTER — Other Ambulatory Visit (INDEPENDENT_AMBULATORY_CARE_PROVIDER_SITE_OTHER): Payer: Self-pay | Admitting: Primary Care

## 2023-09-16 DIAGNOSIS — J439 Emphysema, unspecified: Secondary | ICD-10-CM | POA: Diagnosis not present

## 2023-09-16 DIAGNOSIS — Z008 Encounter for other general examination: Secondary | ICD-10-CM | POA: Diagnosis not present

## 2023-09-16 DIAGNOSIS — G4089 Other seizures: Secondary | ICD-10-CM | POA: Diagnosis not present

## 2023-09-16 DIAGNOSIS — I10 Essential (primary) hypertension: Secondary | ICD-10-CM | POA: Diagnosis not present

## 2023-09-16 DIAGNOSIS — E785 Hyperlipidemia, unspecified: Secondary | ICD-10-CM | POA: Diagnosis not present

## 2023-09-16 DIAGNOSIS — Z6828 Body mass index (BMI) 28.0-28.9, adult: Secondary | ICD-10-CM | POA: Diagnosis not present

## 2023-09-16 DIAGNOSIS — Z760 Encounter for issue of repeat prescription: Secondary | ICD-10-CM

## 2023-09-16 DIAGNOSIS — E782 Mixed hyperlipidemia: Secondary | ICD-10-CM

## 2023-09-16 DIAGNOSIS — E663 Overweight: Secondary | ICD-10-CM | POA: Diagnosis not present

## 2023-09-16 DIAGNOSIS — F17210 Nicotine dependence, cigarettes, uncomplicated: Secondary | ICD-10-CM | POA: Diagnosis not present

## 2023-09-17 NOTE — Telephone Encounter (Signed)
 Requested Prescriptions  Pending Prescriptions Disp Refills   atorvastatin (LIPITOR) 20 MG tablet [Pharmacy Med Name: ATORVASTATIN 20 MG TABLET] 90 tablet 0    Sig: TAKE 1 TABLET BY MOUTH EVERY DAY     Cardiovascular:  Antilipid - Statins Failed - 09/17/2023  1:37 PM      Failed - Lipid Panel in normal range within the last 12 months    Cholesterol, Total  Date Value Ref Range Status  07/29/2023 131 100 - 199 mg/dL Final   LDL Chol Calc (NIH)  Date Value Ref Range Status  07/29/2023 75 0 - 99 mg/dL Final   HDL  Date Value Ref Range Status  07/29/2023 38 (L) >39 mg/dL Final   Triglycerides  Date Value Ref Range Status  07/29/2023 94 0 - 149 mg/dL Final         Passed - Patient is not pregnant      Passed - Valid encounter within last 12 months    Recent Outpatient Visits           1 month ago Essential hypertension   [place] [facility] Grayce Sessions, NP   5 months ago Hospital discharge follow-up   [place] [facility] Grayce Sessions, NP   7 months ago Medication refill   [place] [facility] Grayce Sessions, NP   7 months ago Essential hypertension   [place] [facility] Grayce Sessions, NP   10 months ago Essential hypertension   [place] [facility] Grayce Sessions, NP

## 2023-09-20 ENCOUNTER — Other Ambulatory Visit (INDEPENDENT_AMBULATORY_CARE_PROVIDER_SITE_OTHER): Payer: Self-pay | Admitting: Primary Care

## 2023-09-22 ENCOUNTER — Other Ambulatory Visit (INDEPENDENT_AMBULATORY_CARE_PROVIDER_SITE_OTHER): Payer: Self-pay | Admitting: Primary Care

## 2023-09-22 DIAGNOSIS — Z720 Tobacco use: Secondary | ICD-10-CM | POA: Diagnosis not present

## 2023-09-22 DIAGNOSIS — J302 Other seasonal allergic rhinitis: Secondary | ICD-10-CM | POA: Diagnosis not present

## 2023-09-22 DIAGNOSIS — Z125 Encounter for screening for malignant neoplasm of prostate: Secondary | ICD-10-CM | POA: Diagnosis not present

## 2023-09-22 DIAGNOSIS — F3341 Major depressive disorder, recurrent, in partial remission: Secondary | ICD-10-CM | POA: Diagnosis not present

## 2023-09-22 DIAGNOSIS — E782 Mixed hyperlipidemia: Secondary | ICD-10-CM | POA: Diagnosis not present

## 2023-09-22 DIAGNOSIS — Z131 Encounter for screening for diabetes mellitus: Secondary | ICD-10-CM | POA: Diagnosis not present

## 2023-09-22 DIAGNOSIS — I10 Essential (primary) hypertension: Secondary | ICD-10-CM | POA: Diagnosis not present

## 2023-09-22 DIAGNOSIS — Z760 Encounter for issue of repeat prescription: Secondary | ICD-10-CM

## 2023-09-22 DIAGNOSIS — F418 Other specified anxiety disorders: Secondary | ICD-10-CM

## 2023-09-22 DIAGNOSIS — Z0000 Encounter for general adult medical examination without abnormal findings: Secondary | ICD-10-CM | POA: Diagnosis not present

## 2023-09-22 DIAGNOSIS — Z1389 Encounter for screening for other disorder: Secondary | ICD-10-CM | POA: Diagnosis not present

## 2023-09-22 NOTE — Telephone Encounter (Signed)
 Requested Prescriptions  Pending Prescriptions Disp Refills   loratadine (CLARITIN) 10 MG tablet [Pharmacy Med Name: LORATADINE 10 MG TABLET] 90 tablet 1    Sig: TAKE 1 TABLET BY MOUTH EVERY DAY AS NEEDED FOR ALLERGY     Ear, Nose, and Throat:  Antihistamines 2 Passed - 09/22/2023  3:05 PM      Passed - Cr in normal range and within 360 days    Creatinine, Ser  Date Value Ref Range Status  08/08/2023 0.85 0.61 - 1.24 mg/dL Final         Passed - Valid encounter within last 12 months    Recent Outpatient Visits           1 month ago Essential hypertension   [place] [facility] Marius Siemens, NP   5 months ago Hospital discharge follow-up   [place] [facility] Marius Siemens, NP   7 months ago Medication refill   [place] [facility] Marius Siemens, NP   8 months ago Essential hypertension   [place] [facility] Marius Siemens, NP   11 months ago Essential hypertension   [place] [facility] Marius Siemens, NP

## 2023-09-22 NOTE — Telephone Encounter (Signed)
 Copied from CRM [id number]. Topic: Clinical - Medication Refill >> Sep 22, 2023 11:42 AM Baldemar Lev wrote: Most Recent Primary Care Visit:  Provider: Marius Siemens  Department: [facility]  Visit Type: NURSE VISIT  Date: 09/03/2023  Medication: escitalopram (LEXAPRO) 20 MG tablet  Has the patient contacted their pharmacy? Yes (Agent: If no, request that the patient contact the pharmacy for the refill. If patient does not wish to contact the pharmacy document the reason why and proceed with request.) (Agent: If yes, when and what did the pharmacy advise?)  Is this the correct pharmacy for this prescription? Yes If no, delete pharmacy and type the correct one.  This is the patient's preferred pharmacy:  [id number] [place], [place] - [address] [facility] [address] [address] [place] [address] Phone: [phone] Fax: [phone]   Has the prescription been filled recently? Yes  Is the patient out of the medication? Yes  Has the patient been seen for an appointment in the last year OR does the patient have an upcoming appointment? Yes  Can we respond through MyChart? Yes  Agent: Please be advised that Rx refills may take up to 3 business days. We ask that you follow-up with your pharmacy.

## 2023-09-22 NOTE — Telephone Encounter (Signed)
 Will forward to provider

## 2023-09-23 NOTE — Telephone Encounter (Signed)
 Attempted to contact [facility] to verify last dispensed medication date. Pharmacy closed for lunch.

## 2023-09-23 NOTE — Telephone Encounter (Signed)
 Requested by patient. Last refill 07/29/23 #90 1 refill. Too soon to refill. Refills remaining on medications.  Requested Prescriptions  Refused Prescriptions Disp Refills   hydrochlorothiazide (MICROZIDE) 12.5 MG capsule [Pharmacy Med Name: HYDROCHLOROTHIAZIDE 12.5 MG CP] 90 capsule 0    Sig: TAKE 1 CAPSULE BY MOUTH EVERY DAY     Cardiovascular: Diuretics - Thiazide Failed - 09/23/2023  1:57 PM      Failed - Na in normal range and within 180 days    Sodium  Date Value Ref Range Status  08/08/2023 134 (L) 135 - 145 mmol/L Final  07/29/2023 140 134 - 144 mmol/L Final         Passed - Cr in normal range and within 180 days    Creatinine, Ser  Date Value Ref Range Status  08/08/2023 0.85 0.61 - 1.24 mg/dL Final         Passed - K in normal range and within 180 days    Potassium  Date Value Ref Range Status  08/08/2023 3.6 3.5 - 5.1 mmol/L Final         Passed - Last BP in normal range    BP Readings from Last 1 Encounters:  09/03/23 122/87         Passed - Valid encounter within last 6 months    Recent Outpatient Visits           1 month ago Essential hypertension   [place] [facility] Marius Siemens, NP   6 months ago Hospital discharge follow-up   [place] [facility] Marius Siemens, NP   7 months ago Medication refill   [place] [facility] Marius Siemens, NP   8 months ago Essential hypertension   [place] [facility] Marius Siemens, NP   11 months ago Essential hypertension   [place] [facility] Marius Siemens, NP               escitalopram (LEXAPRO) 20 MG tablet 90 tablet 1    Sig: Take 1 tablet (20 mg total) by mouth daily.     Psychiatry:  Antidepressants - SSRI Passed - 09/23/2023  1:57 PM      Passed - Valid encounter within last 6 months    Recent Outpatient Visits           1 month ago Essential hypertension   [place]  [facility] Marius Siemens, NP   6 months ago Hospital discharge follow-up   [place] [facility] Marius Siemens, NP   7 months ago Medication refill   [place] [facility] Marius Siemens, NP   8 months ago Essential hypertension   [place] [facility] Marius Siemens, NP   11 months ago Essential hypertension   [place] [facility] Marius Siemens, NP

## 2023-09-23 NOTE — Telephone Encounter (Signed)
 Rx's refilled 07/29/2023 #90 1 at pharmacy requested. Pt should have a refill available. Need to call pharmacy.

## 2023-09-23 NOTE — Telephone Encounter (Signed)
 Called patient [phone] to review medication refill requests. Person other than patient answered phone and reported patient asleep. NT followed HIPAA protocol. Person on phone reports "he found his medicine". NT requested patient to call back for further requests at #[phone].

## 2023-10-02 ENCOUNTER — Other Ambulatory Visit (INDEPENDENT_AMBULATORY_CARE_PROVIDER_SITE_OTHER): Payer: Self-pay | Admitting: Primary Care

## 2023-10-02 DIAGNOSIS — E782 Mixed hyperlipidemia: Secondary | ICD-10-CM

## 2023-10-02 DIAGNOSIS — Z760 Encounter for issue of repeat prescription: Secondary | ICD-10-CM

## 2023-10-08 DIAGNOSIS — Z720 Tobacco use: Secondary | ICD-10-CM | POA: Diagnosis not present

## 2023-10-08 DIAGNOSIS — F3341 Major depressive disorder, recurrent, in partial remission: Secondary | ICD-10-CM | POA: Diagnosis not present

## 2023-10-08 DIAGNOSIS — E782 Mixed hyperlipidemia: Secondary | ICD-10-CM | POA: Diagnosis not present

## 2023-10-08 DIAGNOSIS — J302 Other seasonal allergic rhinitis: Secondary | ICD-10-CM | POA: Diagnosis not present

## 2023-10-08 DIAGNOSIS — R7303 Prediabetes: Secondary | ICD-10-CM | POA: Diagnosis not present

## 2023-10-08 DIAGNOSIS — I10 Essential (primary) hypertension: Secondary | ICD-10-CM | POA: Diagnosis not present

## 2023-10-13 ENCOUNTER — Ambulatory Visit: Attending: Neurology | Admitting: Physical Therapy

## 2023-10-13 DIAGNOSIS — R2689 Other abnormalities of gait and mobility: Secondary | ICD-10-CM | POA: Insufficient documentation

## 2023-10-13 DIAGNOSIS — R269 Unspecified abnormalities of gait and mobility: Secondary | ICD-10-CM | POA: Insufficient documentation

## 2023-10-16 ENCOUNTER — Ambulatory Visit: Admitting: Physical Therapy

## 2023-10-16 ENCOUNTER — Other Ambulatory Visit: Payer: Self-pay

## 2023-10-16 ENCOUNTER — Encounter: Payer: Self-pay | Admitting: Physical Therapy

## 2023-10-16 VITALS — BP 118/82 | HR 94

## 2023-10-16 DIAGNOSIS — R269 Unspecified abnormalities of gait and mobility: Secondary | ICD-10-CM | POA: Diagnosis present

## 2023-10-16 DIAGNOSIS — R2689 Other abnormalities of gait and mobility: Secondary | ICD-10-CM | POA: Diagnosis not present

## 2023-10-16 NOTE — Therapy (Signed)
 OUTPATIENT PHYSICAL THERAPY NEURO EVALUATION   Patient Name: Casey Warren MRN: [id number] DOB:31-May-1961, 63 y.o., male Today's Date: 10/17/2023   PCP: Marius Siemens, NP previous PCP but patient cannot recall name of new PT REFERRING PROVIDER: Cassandra Cleveland, MD  END OF SESSION:  PT End of Session - 10/16/23 1543     Visit Number 1    Number of Visits 1    Date for PT Re-Evaluation 10/16/23    Authorization Type [employer]    PT Start Time 1543   late check in from front   PT Stop Time 1615    PT Time Calculation (min) 32 min    Equipment Utilized During Treatment Gait belt    Activity Tolerance Patient tolerated treatment well    Behavior During Therapy WFL for tasks assessed/performed             Past Medical History:  Diagnosis Date   Angio-edema    Anxiety    related to surgery   Asthma    GERD (gastroesophageal reflux disease)    History of blood transfusion    HTN (hypertension)    Seizures (HCC)    last seizure in 2023 per patient   Ulcer    Past Surgical History:  Procedure Laterality Date   CLOSED REDUCTION NASAL FRACTURE  11/19/2011   Procedure: CLOSED REDUCTION NASAL FRACTURE;  Surgeon: Lenton Rail, MD;  Location: [facility] OR;  Service: ENT;  Laterality: N/A;  CLOSED REDUCTION NASAL AND NASAL SEPTAL FRACTURE WITH STABILIZATION   No surgical     Patient Active Problem List   Diagnosis Date Noted   Low back pain 05/20/2023   Angioedema 07/01/2022   Anxiety 07/01/2022   Hyponatremia 11/30/2019   AKI (acute kidney injury) (HCC) 11/30/2019   Hypotension due to medication 11/30/2019   Seizures (HCC) 11/30/2019   Abnormal EKG 04/06/2017   Essential hypertension 04/06/2017   ANEMIA 04/28/2008   Alcohol abuse 04/28/2008   TOBACCO ABUSE 04/28/2008   SUBSTANCE ABUSE, MULTIPLE 04/28/2008   Dental caries 04/28/2008   GERD 04/28/2008   PEPTIC ULCER DISEASE, HELICOBACTER PYLORI POSITIVE 04/28/2008    ONSET DATE: 07/07/2023 (referral  date)  REFERRING DIAG: R26.9 (ICD-10-CM) - Abnormal gait F40.298 (ICD-10-CM) - Fear of falling  THERAPY DIAG:  Abnormality of gait and mobility - Plan: PT plan of care cert/re-cert  Rationale for Evaluation and Treatment: Rehabilitation  SUBJECTIVE:  SUBJECTIVE STATEMENT: Since PT last attempted to evaluate patient, patient BP largely improved. Patient reports that his children were concerned about small steps he was taking and were worried about falls. Patient reports that he feels like his walking has improved significantly in the last few months and doesn't think he needs PT but is in agreement to having some tests done today.   Pt accompanied by: self and daughter drove patient did not attend session   PERTINENT HISTORY: alcohol abuse, substance abuse, seizures   PAIN:  Are you having pain? No  PRECAUTIONS: Fall and Other: seizures but none in the last few years   RED FLAGS: None   WEIGHT BEARING RESTRICTIONS: No  FALLS: Has patient fallen in last 6 months? No  LIVING ENVIRONMENT: Lives with: lives with their spouse Lives in: House/apartment Stairs: Yes: External: 5 steps; none Has following equipment at home: None  PLOF: Independent with exception of driving which patient hasn't done in a long time  PATIENT GOALS: "I don't think I need PT but you can test some things."   OBJECTIVE:  Note: Objective measures were completed at Evaluation unless otherwise noted.  DIAGNOSTIC FINDINGS:   11/30/2019: IMPRESSION: 1. Hypodensity in the right cerebellum is new from 01/13/2017 and could represent a chronic or late subacute infarct. 2. Remote left cerebellar infarct. Remote lacunar infarcts in the pons, and bilateral basal ganglia. 3. Periventricular white matter and corona radiata  hypodensities favor chronic ischemic microvascular white matter disease. 4. Chronic right maxillary sinusitis.  No acute changes with more recent changes   COGNITION: Overall cognitive status: Within functional limits for tasks assessed - imaging shows possibility for memory changes but patient denies any throughout therapy session and answers were appropriate    SENSATION: WFL  COORDINATION: Grossly WFL other than very mild dysmetria with toe tapping  EDEMA:  Reports none   MUSCLE TONE: LLE: Modifed Ashworth Scale 1 = Slight increase in muscle tone, manifested by a catch and release or by minimal resistance at the end of the range of motion when the affected part(s) is moved in flexion or extension  LOWER EXTREMITY ROM:     Lacking approximately 5 degrees of terminal knees extension on LLE  LOWER EXTREMITY MMT:    MMT Right Eval Left Eval  Hip flexion 4/5 4-/5  Hip extension    Hip abduction 4/5 4-/5  Hip adduction 4/5 4-/5  Hip internal rotation    Hip external rotation    Knee flexion 4/5 4-/5  Knee extension 4/5 4-/5  Ankle dorsiflexion 4/5 4-/5  Ankle plantarflexion    Ankle inversion    Ankle eversion    (Blank rows = not tested)  BED MOBILITY:  Denies no challenges with bed mobility   TRANSFERS: Sit to stand: Complete Independence  Assistive device utilized: None     Stand to sit: Complete Independence  Assistive device utilized: None     Chair to chair: Modified independence  Assistive device utilized: None       GAIT: Findings: Gait Characteristics: decreased hip/knee flexion- Right, decreased hip/knee flexion- Left, decreased ankle dorsiflexion- Right, decreased ankle dorsiflexion- Left, and trunk flexed, Distance walked: various clinic distances, Assistive device utilized:None, Level of assistance: Modified independence, and Comments: reduced stride length and forefoot contact but safe and functional ambulation, low falls risk as this time, see  FGA  FUNCTIONAL TESTS:    [facility] PT Assessment - 10/16/23 0001       Standardized Balance Assessment   Standardized Balance  Assessment Five Times Sit to Stand;10 meter walk test    Five times sit to stand comments  11.44   seconds without UE use (SBA)   10 Meter Walk 1.05 m/s   modI without AD     Functional Gait  Assessment   Gait assessed  Yes    Gait Level Surface Walks 20 ft in less than 5.5 sec, no assistive devices, good speed, no evidence for imbalance, normal gait pattern, deviates no more than 6 in outside of the 12 in walkway width.    Change in Gait Speed Able to smoothly change walking speed without loss of balance or gait deviation. Deviate no more than 6 in outside of the 12 in walkway width.    Gait with Horizontal Head Turns Performs head turns smoothly with slight change in gait velocity (eg, minor disruption to smooth gait path), deviates 6-10 in outside 12 in walkway width, or uses an assistive device.    Gait with Vertical Head Turns Performs head turns with no change in gait. Deviates no more than 6 in outside 12 in walkway width.    Gait and Pivot Turn Pivot turns safely within 3 sec and stops quickly with no loss of balance.    Step Over Obstacle Is able to step over 2 stacked shoe boxes taped together (9 in total height) without changing gait speed. No evidence of imbalance.    Gait with Narrow Base of Support Ambulates less than 4 steps heel to toe or cannot perform without assistance.    Gait with Eyes Closed Walks 20 ft, no assistive devices, good speed, no evidence of imbalance, normal gait pattern, deviates no more than 6 in outside 12 in walkway width. Ambulates 20 ft in less than 7 sec.    Ambulating Backwards Walks 20 ft, uses assistive device, slower speed, mild gait deviations, deviates 6-10 in outside 12 in walkway width.    Steps Alternating feet, must use rail.    Total Score 24    FGA comment: 24/30 = low falls risk                                                                                                                               TREATMENT:    Self Care: PT educated on examination findings including testing indication low falls risk, PT explained that there are some minor deficits if patient wanting to work on addressing further but patient politely declines at this time, PT educates on BP improvements since patient last attempted to be evaluated, continues to encourage compliance for safety, patient verbalizes understanding   PATIENT EDUCATION: Education details: POC, goal collaboration, examination findings Person educated: Patient Education method: Explanation Education comprehension: verbalized understanding  HOME EXERCISE PROGRAM: Not indicated at this time  GOALS: Goals reviewed with patient? Yes  LONG TERM GOALS: Target date: 10/16/2023  Patient will verbalize appropriate understanding of education regarding falls risk provided by PT in today's session.  Baseline: PT educates patient and patient verbalizes understanding  Goal status: MET   ASSESSMENT:  CLINICAL IMPRESSION: Patient is a 63 y.o. male who was seen today for physical therapy evaluation and treatment for impairments secondary to chronic strokes and family expressing concerns in regards to imbalance per patient. Patient functional testing demonstrated low falls risk at this time but is near cutoffs. Patient would have potentially benefited from brief PT POC but patient politely declines further services at this time as is satisified with current level of progress. Patient does demonstrate minor LE weakness L > R but overall is functional with movements and does not require AD at this time. Per patient request, no further PT services required at this time beyond 1x visit.   OBJECTIVE IMPAIRMENTS: Abnormal gait, decreased balance, and decreased strength.   ACTIVITY LIMITATIONS: locomotion level  PARTICIPATION LIMITATIONS: community activity  PERSONAL  FACTORS: Age, Time since onset of injury/illness/exacerbation, and 1-2 comorbidities: see above are also affecting patient's functional outcome.   REHAB POTENTIAL: Fair multiple commodities   CLINICAL DECISION MAKING: Stable/uncomplicated  EVALUATION COMPLEXITY: Low  PLAN:  PT FREQUENCY: one time visit  PT DURATION:  one time visit  PLANNED INTERVENTIONS: [id number]- Self Care  PLAN FOR NEXT SESSION: NA - no further PT services indicated at this time; patient satisfied with function and scores on testing provided today   Coreen Devoid, PT, DPT 10/17/2023, 7:15 AM

## 2023-10-17 ENCOUNTER — Encounter: Payer: Self-pay | Admitting: Physical Therapy

## 2023-10-30 ENCOUNTER — Other Ambulatory Visit (INDEPENDENT_AMBULATORY_CARE_PROVIDER_SITE_OTHER): Payer: Self-pay | Admitting: Primary Care

## 2023-10-30 NOTE — Telephone Encounter (Signed)
 Will forward to provider

## 2023-11-13 ENCOUNTER — Ambulatory Visit: Admitting: Pulmonary Disease

## 2023-11-13 ENCOUNTER — Encounter: Payer: Self-pay | Admitting: Pulmonary Disease

## 2023-12-03 ENCOUNTER — Telehealth (INDEPENDENT_AMBULATORY_CARE_PROVIDER_SITE_OTHER): Payer: Self-pay

## 2023-12-03 NOTE — Telephone Encounter (Signed)
 Reached out to pt in regards to 12/04/23 appointment. Appointment will need to be reschedule due to provider will be out of the office. Pt didn't answer lvm

## 2023-12-04 ENCOUNTER — Ambulatory Visit (INDEPENDENT_AMBULATORY_CARE_PROVIDER_SITE_OTHER): Admitting: Primary Care

## 2023-12-18 ENCOUNTER — Other Ambulatory Visit (INDEPENDENT_AMBULATORY_CARE_PROVIDER_SITE_OTHER): Payer: Self-pay | Admitting: Primary Care

## 2023-12-18 DIAGNOSIS — E782 Mixed hyperlipidemia: Secondary | ICD-10-CM

## 2023-12-18 DIAGNOSIS — Z760 Encounter for issue of repeat prescription: Secondary | ICD-10-CM

## 2023-12-19 ENCOUNTER — Other Ambulatory Visit (INDEPENDENT_AMBULATORY_CARE_PROVIDER_SITE_OTHER): Payer: Self-pay | Admitting: Primary Care

## 2023-12-19 DIAGNOSIS — Z760 Encounter for issue of repeat prescription: Secondary | ICD-10-CM

## 2023-12-19 DIAGNOSIS — E782 Mixed hyperlipidemia: Secondary | ICD-10-CM

## 2023-12-19 NOTE — Telephone Encounter (Unsigned)
 Copied from CRM [id number]. Topic: Clinical - Medication Refill >> Dec 19, 2023  2:32 PM Yolanda T wrote: Medication: atorvastatin  (LIPITOR) 20 MG tablet  Has the patient contacted their pharmacy? No  This is the patient's preferred pharmacy:  [id number] [place], [place] - [address] [facility] [address]  [place] [address] Phone: [phone] Fax: [phone]  Is this the correct pharmacy for this prescription? Yes  Has the prescription been filled recently? Yes  Is the patient out of the medication? Yes  Has the patient been seen for an appointment in the last year OR does the patient have an upcoming appointment? Yes  Can we respond through MyChart? No  Agent: Please be advised that Rx refills may take up to 3 business days. We ask that you follow-up with your pharmacy.

## 2023-12-20 NOTE — Telephone Encounter (Signed)
 Requested Prescriptions  Pending Prescriptions Disp Refills   atorvastatin  (LIPITOR) 20 MG tablet [Pharmacy Med Name: ATORVASTATIN  20 MG TABLET] 90 tablet 1    Sig: TAKE 1 TABLET BY MOUTH EVERY DAY     Cardiovascular:  Antilipid - Statins Failed - 12/20/2023 11:15 AM      Failed - Lipid Panel in normal range within the last 12 months    Cholesterol, Total  Date Value Ref Range Status  07/29/2023 131 100 - 199 mg/dL Final   LDL Chol Calc (NIH)  Date Value Ref Range Status  07/29/2023 75 0 - 99 mg/dL Final   HDL  Date Value Ref Range Status  07/29/2023 38 (L) >39 mg/dL Final   Triglycerides  Date Value Ref Range Status  07/29/2023 94 0 - 149 mg/dL Final         Passed - Patient is not pregnant      Passed - Valid encounter within last 12 months    Recent Outpatient Visits           4 months ago Essential hypertension   [place] [facility] Celestia Rosaline SQUIBB, NP   8 months ago Hospital discharge follow-up   [place] [facility] Celestia Rosaline SQUIBB, NP   10 months ago Medication refill   [place] [facility] Celestia Rosaline SQUIBB, NP   11 months ago Essential hypertension   [place] [facility] Celestia Rosaline SQUIBB, NP   1 year ago Essential hypertension    [facility] Celestia Rosaline SQUIBB, NP

## 2023-12-22 NOTE — Telephone Encounter (Signed)
 Requested Prescriptions  Refused Prescriptions Disp Refills   atorvastatin  (LIPITOR) 20 MG tablet 90 tablet 0    Sig: Take 1 tablet (20 mg total) by mouth daily.     Cardiovascular:  Antilipid - Statins Failed - 12/22/2023  2:06 PM      Failed - Lipid Panel in normal range within the last 12 months    Cholesterol, Total  Date Value Ref Range Status  07/29/2023 131 100 - 199 mg/dL Final   LDL Chol Calc (NIH)  Date Value Ref Range Status  07/29/2023 75 0 - 99 mg/dL Final   HDL  Date Value Ref Range Status  07/29/2023 38 (L) >39 mg/dL Final   Triglycerides  Date Value Ref Range Status  07/29/2023 94 0 - 149 mg/dL Final         Passed - Patient is not pregnant      Passed - Valid encounter within last 12 months    Recent Outpatient Visits           4 months ago Essential hypertension   [place] [facility] Celestia Rosaline SQUIBB, NP   9 months ago Hospital discharge follow-up   [place] [facility] Celestia Rosaline SQUIBB, NP   10 months ago Medication refill   [place] [facility] Celestia Rosaline SQUIBB, NP   11 months ago Essential hypertension   [place] [facility] Celestia Rosaline SQUIBB, NP   1 year ago Essential hypertension   [name] [facility] Celestia Rosaline SQUIBB, NP

## 2024-01-07 ENCOUNTER — Other Ambulatory Visit (INDEPENDENT_AMBULATORY_CARE_PROVIDER_SITE_OTHER): Payer: Self-pay | Admitting: Primary Care

## 2024-01-07 DIAGNOSIS — R7303 Prediabetes: Secondary | ICD-10-CM | POA: Diagnosis not present

## 2024-01-07 DIAGNOSIS — E782 Mixed hyperlipidemia: Secondary | ICD-10-CM | POA: Diagnosis not present

## 2024-01-07 DIAGNOSIS — G40909 Epilepsy, unspecified, not intractable, without status epilepticus: Secondary | ICD-10-CM | POA: Diagnosis not present

## 2024-01-07 DIAGNOSIS — Z720 Tobacco use: Secondary | ICD-10-CM | POA: Diagnosis not present

## 2024-01-07 DIAGNOSIS — I10 Essential (primary) hypertension: Secondary | ICD-10-CM | POA: Diagnosis not present

## 2024-01-07 DIAGNOSIS — F3341 Major depressive disorder, recurrent, in partial remission: Secondary | ICD-10-CM | POA: Diagnosis not present

## 2024-01-07 DIAGNOSIS — J302 Other seasonal allergic rhinitis: Secondary | ICD-10-CM | POA: Diagnosis not present

## 2024-01-08 NOTE — Telephone Encounter (Signed)
 Requested Prescriptions  Refused Prescriptions Disp Refills   loratadine  (CLARITIN ) 10 MG tablet [Pharmacy Med Name: LORATADINE  10 MG TABLET] 90 tablet 1    Sig: TAKE 1 TABLET BY MOUTH EVERY DAY AS NEEDED FOR ALLERGY     Ear, Nose, and Throat:  Antihistamines 2 Passed - 01/08/2024  1:57 PM      Passed - Cr in normal range and within 360 days    Creatinine, Ser  Date Value Ref Range Status  08/08/2023 0.85 0.61 - 1.24 mg/dL Final         Passed - Valid encounter within last 12 months    Recent Outpatient Visits           5 months ago Essential hypertension   [place] [facility] Celestia Rosaline SQUIBB, NP   9 months ago Hospital discharge follow-up   [place] [facility] Celestia Rosaline SQUIBB, NP   10 months ago Medication refill   [place] [facility] Celestia Rosaline SQUIBB, NP   11 months ago Essential hypertension   [place] [facility] Celestia Rosaline SQUIBB, NP   1 year ago Essential hypertension   [place] [facility] Celestia Rosaline SQUIBB, NP

## 2024-01-19 NOTE — Progress Notes (Deleted)
 Ellouise Console, PA-C [address] [place], [place]  [address] Phone: [phone]   Primary Care Physician: Celestia Rosaline SQUIBB, NP  Primary Gastroenterologist:  Ellouise Console, PA-C / Glendia Holt, MD   Chief Complaint:  ***       HPI:   Casey Warren is a 63 y.o. male, established patient of Dr. Holt, returns for follow-up.  Current symptoms:  07/10/2023: First screening colonoscopy by Dr. Holt: One 8 mm tubular adenoma polyp removed from ascending colon.  Erythematous mucosa in the ascending colon.  Biopsy showed active nonspecific colitis.  (Not IBD).  No dysplasia.  Severe diverticulosis throughout the entire colon.  Nonbleeding internal hemorrhoids.  Bowel prep adequate with Suprep.  7-year repeat (06/2030).  Current Outpatient Medications  Medication Sig Dispense Refill   albuterol  (VENTOLIN  HFA) 108 (90 Base) MCG/ACT inhaler Inhale 2 puffs into the lungs every 6 (six) hours as needed for wheezing or shortness of breath. 18 g 2   amLODipine  (NORVASC ) 10 MG tablet Take 1 tablet (10 mg total) by mouth daily. 90 tablet 3   aspirin  EC 81 MG tablet Take 81 mg by mouth daily.     atorvastatin  (LIPITOR) 20 MG tablet TAKE 1 TABLET BY MOUTH EVERY DAY 90 tablet 1   EPINEPHrine  0.3 mg/0.3 mL IJ SOAJ injection Inject 0.3 mg into the muscle as needed for anaphylaxis. (Patient not taking: Reported on 08/14/2023) 1 each 2   escitalopram  (LEXAPRO ) 20 MG tablet Take 1 tablet (20 mg total) by mouth daily. 90 tablet 1   famotidine  (PEPCID ) 20 MG tablet Take 1 tablet (20 mg total) by mouth 2 (two) times daily for 7 days. 14 tablet 0   guaiFENesin  200 MG tablet Take 1 tablet (200 mg total) by mouth every 4 (four) hours as needed for cough or to loosen phlegm. (Patient not taking: Reported on 08/14/2023) 30 suppository 1   hydrochlorothiazide  (MICROZIDE ) 12.5 MG capsule TAKE 1 CAPSULE BY MOUTH EVERY DAY 90 capsule 0   loratadine  (CLARITIN ) 10 MG tablet TAKE 1 TABLET BY MOUTH EVERY  DAY AS NEEDED FOR ALLERGY 90 tablet 1   topiramate  (TOPAMAX ) 50 MG tablet Take 1 tablet (50 mg total) by mouth 2 (two) times daily. 90 tablet 3   valsartan  (DIOVAN ) 40 MG tablet Take 1 tablet (40 mg total) by mouth daily. 90 tablet 1   Current Facility-Administered Medications  Medication Dose Route Frequency Provider Last Rate Last Admin   cloNIDine  (CATAPRES ) tablet 0.1 mg  0.1 mg Oral Once         Allergies as of 01/20/2024 - Review Complete 10/17/2023  Allergen Reaction Noted   Citrus Swelling 02/05/2017   Fruit extracts Swelling 07/01/2022   Chlorthalidone  Other (See Comments) 11/30/2019    Past Medical History:  Diagnosis Date   Angio-edema    Anxiety    related to surgery   Asthma    GERD (gastroesophageal reflux disease)    History of blood transfusion    HTN (hypertension)    Seizures (HCC)    last seizure in 2023 per patient   Ulcer     Past Surgical History:  Procedure Laterality Date   CLOSED REDUCTION NASAL FRACTURE  11/19/2011   Procedure: CLOSED REDUCTION NASAL FRACTURE;  Surgeon: Marlyce Finer, MD;  Location: [facility] OR;  Service: ENT;  Laterality: N/A;  CLOSED REDUCTION NASAL AND NASAL SEPTAL FRACTURE WITH STABILIZATION   No surgical      Review of Systems:  All systems reviewed and negative except where noted in HPI.    Physical Exam:  There were no vitals taken for this visit. No LMP for male patient.  General: Well-nourished, well-developed in no acute distress.  Lungs: Clear to auscultation bilaterally. Non-labored. Heart: Regular rate and rhythm, no murmurs rubs or gallops.  Abdomen: Bowel sounds are normal; Abdomen is Soft; No hepatosplenomegaly, masses or hernias;  No Abdominal Tenderness; No guarding or rebound tenderness. Neuro: Alert and oriented x 3.  Grossly intact.  Psych: Alert and cooperative, normal mood and affect.   Imaging Studies: No results found.  Labs: CBC    Component Value Date/Time   WBC 9.1 08/08/2023 2042   RBC 5.51  08/08/2023 2042   HGB 17.5 (H) 08/08/2023 2042   HGB 17.4 07/29/2023 1336   HCT 49.0 08/08/2023 2042   HCT 50.4 07/29/2023 1336   PLT 306 08/08/2023 2042   PLT 292 07/29/2023 1336   MCV 88.9 08/08/2023 2042   MCV 93 07/29/2023 1336   MCH 31.8 08/08/2023 2042   MCHC 35.7 08/08/2023 2042   RDW 12.9 08/08/2023 2042   RDW 13.2 07/29/2023 1336   LYMPHSABS 1.2 08/08/2023 2042   LYMPHSABS 2.0 07/29/2023 1336   MONOABS 0.3 08/08/2023 2042   EOSABS 0.1 08/08/2023 2042   EOSABS 0.2 07/29/2023 1336   BASOSABS 0.1 08/08/2023 2042   BASOSABS 0.1 07/29/2023 1336    CMP     Component Value Date/Time   NA 134 (L) 08/08/2023 2042   NA 140 07/29/2023 1336   K 3.6 08/08/2023 2042   CL 99 08/08/2023 2042   CO2 21 (L) 08/08/2023 2042   GLUCOSE 107 (H) 08/08/2023 2042   BUN 6 (L) 08/08/2023 2042   BUN 6 (L) 07/29/2023 1336   CREATININE 0.85 08/08/2023 2042   CALCIUM  9.2 08/08/2023 2042   PROT 7.3 08/08/2023 2042   PROT 7.0 07/29/2023 1336   ALBUMIN 3.9 08/08/2023 2042   ALBUMIN 4.3 07/29/2023 1336   AST 19 08/08/2023 2042   ALT 19 08/08/2023 2042   ALKPHOS 82 08/08/2023 2042   BILITOT 0.8 08/08/2023 2042   BILITOT 0.4 07/29/2023 1336   GFRNONAA >60 08/08/2023 2042   GFRAA 105 12/16/2019 1106       Assessment and Plan:   Casey Warren is a 63 y.o. y/o male ***    Ellouise Console, PA-C  Follow up ***

## 2024-01-20 ENCOUNTER — Ambulatory Visit: Admitting: Physician Assistant

## 2024-02-01 ENCOUNTER — Other Ambulatory Visit (INDEPENDENT_AMBULATORY_CARE_PROVIDER_SITE_OTHER): Payer: Self-pay | Admitting: Primary Care

## 2024-02-06 DIAGNOSIS — I10 Essential (primary) hypertension: Secondary | ICD-10-CM | POA: Diagnosis not present

## 2024-02-06 DIAGNOSIS — Z720 Tobacco use: Secondary | ICD-10-CM | POA: Diagnosis not present

## 2024-02-06 DIAGNOSIS — R7303 Prediabetes: Secondary | ICD-10-CM | POA: Diagnosis not present

## 2024-02-06 DIAGNOSIS — E782 Mixed hyperlipidemia: Secondary | ICD-10-CM | POA: Diagnosis not present

## 2024-02-06 DIAGNOSIS — R55 Syncope and collapse: Secondary | ICD-10-CM | POA: Diagnosis not present

## 2024-02-06 DIAGNOSIS — F3341 Major depressive disorder, recurrent, in partial remission: Secondary | ICD-10-CM | POA: Diagnosis not present

## 2024-02-06 DIAGNOSIS — J302 Other seasonal allergic rhinitis: Secondary | ICD-10-CM | POA: Diagnosis not present

## 2024-02-06 DIAGNOSIS — M25512 Pain in left shoulder: Secondary | ICD-10-CM | POA: Diagnosis not present

## 2024-02-06 DIAGNOSIS — R000 Tachycardia, unspecified: Secondary | ICD-10-CM | POA: Diagnosis not present

## 2024-02-06 DIAGNOSIS — G40909 Epilepsy, unspecified, not intractable, without status epilepticus: Secondary | ICD-10-CM | POA: Diagnosis not present

## 2024-02-07 ENCOUNTER — Other Ambulatory Visit (INDEPENDENT_AMBULATORY_CARE_PROVIDER_SITE_OTHER): Payer: Self-pay | Admitting: Primary Care

## 2024-02-10 NOTE — Telephone Encounter (Signed)
 Call to patient/wife- states he has another provider now- advised to contact pharmacy to make sure his requests are going to the correct provider.  Requested Prescriptions  Pending Prescriptions Disp Refills   loratadine  (CLARITIN ) 10 MG tablet [Pharmacy Med Name: LORATADINE  10 MG TABLET] 90 tablet 1    Sig: TAKE 1 TABLET BY MOUTH EVERY DAY AS NEEDED FOR ALLERGY     Ear, Nose, and Throat:  Antihistamines 2 Passed - 02/10/2024 11:02 AM      Passed - Cr in normal range and within 360 days    Creatinine, Ser  Date Value Ref Range Status  08/08/2023 0.85 0.61 - 1.24 mg/dL Final         Passed - Valid encounter within last 12 months    Recent Outpatient Visits           6 months ago Essential hypertension   [place] [facility] Celestia Rosaline SQUIBB, NP   10 months ago Hospital discharge follow-up   [place] [facility] Celestia Rosaline SQUIBB, NP   12 months ago Medication refill   [place] [facility] Celestia Rosaline SQUIBB, NP   1 year ago Essential hypertension   [place] [facility] Celestia Rosaline SQUIBB, NP   1 year ago Essential hypertension   [place] [facility] Celestia Rosaline SQUIBB, NP               hydrochlorothiazide  (MICROZIDE ) 12.5 MG capsule [Pharmacy Med Name: HYDROCHLOROTHIAZIDE  12.5 MG CP] 90 capsule 0    Sig: TAKE 1 CAPSULE BY MOUTH EVERY DAY     Cardiovascular: Diuretics - Thiazide Failed - 02/10/2024 11:02 AM      Failed - Cr in normal range and within 180 days    Creatinine, Ser  Date Value Ref Range Status  08/08/2023 0.85 0.61 - 1.24 mg/dL Final         Failed - K in normal range and within 180 days    Potassium  Date Value Ref Range Status  08/08/2023 3.6 3.5 - 5.1 mmol/L Final         Failed - Na in normal range and within 180 days    Sodium  Date Value Ref Range Status  08/08/2023 134 (L) 135 - 145 mmol/L Final  07/29/2023 140 134 - 144 mmol/L Final          Failed - Valid encounter within last 6 months    Recent Outpatient Visits           6 months ago Essential hypertension   [place] [facility] Celestia Rosaline SQUIBB, NP   10 months ago Hospital discharge follow-up   [place] [facility] Celestia Rosaline SQUIBB, NP   12 months ago Medication refill   [place] [facility] Celestia Rosaline SQUIBB, NP   1 year ago Essential hypertension   [place] [facility] Celestia Rosaline SQUIBB, NP   1 year ago Essential hypertension   [place] [facility] Celestia Rosaline SQUIBB, NP              Passed - Last BP in normal range    BP Readings from Last 1 Encounters:  10/16/23 118/82

## 2024-02-16 ENCOUNTER — Ambulatory Visit (INDEPENDENT_AMBULATORY_CARE_PROVIDER_SITE_OTHER): Admitting: Neurology

## 2024-02-16 ENCOUNTER — Encounter: Payer: Self-pay | Admitting: Neurology

## 2024-02-16 VITALS — BP 90/56 | HR 112 | Ht 64.0 in | Wt 150.0 lb

## 2024-02-16 DIAGNOSIS — M25512 Pain in left shoulder: Secondary | ICD-10-CM

## 2024-02-16 MED ORDER — CYCLOBENZAPRINE HCL 10 MG PO TABS: 10.0000 mg | ORAL_TABLET | Freq: Three times a day (TID) | ORAL | 0 refills | Status: DC | PRN
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
## 2024-02-16 NOTE — Patient Instructions (Signed)
 Use Aleve as needed for shoulder pain Will prescribe Flexeril  X-ray of the left shoulder Please contact your PCP for referral to orthopedist Return sooner if worse

## 2024-03-02 ENCOUNTER — Encounter (HOSPITAL_COMMUNITY): Payer: Self-pay | Admitting: Emergency Medicine

## 2024-03-02 ENCOUNTER — Inpatient Hospital Stay (HOSPITAL_COMMUNITY)
Admission: EM | Admit: 2024-03-02 | Discharge: 2024-03-16 | DRG: 314 | Disposition: A | Discharge status: Home Health | Attending: Internal Medicine | Admitting: Internal Medicine

## 2024-03-02 ENCOUNTER — Emergency Department (HOSPITAL_COMMUNITY)

## 2024-03-02 DIAGNOSIS — N179 Acute kidney failure, unspecified: Secondary | ICD-10-CM | POA: Diagnosis present

## 2024-03-02 DIAGNOSIS — G9341 Metabolic encephalopathy: Secondary | ICD-10-CM | POA: Diagnosis present

## 2024-03-02 DIAGNOSIS — E860 Dehydration: Secondary | ICD-10-CM | POA: Diagnosis present

## 2024-03-02 DIAGNOSIS — F1010 Alcohol abuse, uncomplicated: Secondary | ICD-10-CM | POA: Diagnosis not present

## 2024-03-02 DIAGNOSIS — E2740 Unspecified adrenocortical insufficiency: Secondary | ICD-10-CM | POA: Diagnosis present

## 2024-03-02 DIAGNOSIS — Z79899 Other long term (current) drug therapy: Secondary | ICD-10-CM

## 2024-03-02 DIAGNOSIS — E440 Moderate protein-calorie malnutrition: Secondary | ICD-10-CM | POA: Diagnosis not present

## 2024-03-02 DIAGNOSIS — F32A Depression, unspecified: Secondary | ICD-10-CM | POA: Diagnosis present

## 2024-03-02 DIAGNOSIS — Z8249 Family history of ischemic heart disease and other diseases of the circulatory system: Secondary | ICD-10-CM

## 2024-03-02 DIAGNOSIS — E069 Thyroiditis, unspecified: Secondary | ICD-10-CM | POA: Diagnosis present

## 2024-03-02 DIAGNOSIS — I959 Hypotension, unspecified: Secondary | ICD-10-CM | POA: Diagnosis not present

## 2024-03-02 DIAGNOSIS — R634 Abnormal weight loss: Secondary | ICD-10-CM | POA: Diagnosis not present

## 2024-03-02 DIAGNOSIS — E43 Unspecified severe protein-calorie malnutrition: Secondary | ICD-10-CM | POA: Diagnosis present

## 2024-03-02 DIAGNOSIS — E861 Hypovolemia: Secondary | ICD-10-CM | POA: Diagnosis not present

## 2024-03-02 DIAGNOSIS — G40909 Epilepsy, unspecified, not intractable, without status epilepticus: Secondary | ICD-10-CM | POA: Diagnosis not present

## 2024-03-02 DIAGNOSIS — E785 Hyperlipidemia, unspecified: Secondary | ICD-10-CM | POA: Diagnosis present

## 2024-03-02 DIAGNOSIS — I9589 Other hypotension: Principal | ICD-10-CM | POA: Diagnosis present

## 2024-03-02 DIAGNOSIS — Z7982 Long term (current) use of aspirin: Secondary | ICD-10-CM

## 2024-03-02 DIAGNOSIS — Z91018 Allergy to other foods: Secondary | ICD-10-CM

## 2024-03-02 DIAGNOSIS — E876 Hypokalemia: Secondary | ICD-10-CM | POA: Diagnosis present

## 2024-03-02 DIAGNOSIS — W19XXXA Unspecified fall, initial encounter: Secondary | ICD-10-CM | POA: Diagnosis present

## 2024-03-02 DIAGNOSIS — Z23 Encounter for immunization: Secondary | ICD-10-CM

## 2024-03-02 DIAGNOSIS — E782 Mixed hyperlipidemia: Secondary | ICD-10-CM | POA: Diagnosis not present

## 2024-03-02 DIAGNOSIS — I639 Cerebral infarction, unspecified: Secondary | ICD-10-CM | POA: Diagnosis not present

## 2024-03-02 DIAGNOSIS — F10139 Alcohol abuse with withdrawal, unspecified: Secondary | ICD-10-CM | POA: Diagnosis present

## 2024-03-02 DIAGNOSIS — F3341 Major depressive disorder, recurrent, in partial remission: Secondary | ICD-10-CM | POA: Diagnosis not present

## 2024-03-02 DIAGNOSIS — Z751 Person awaiting admission to adequate facility elsewhere: Secondary | ICD-10-CM

## 2024-03-02 DIAGNOSIS — T380X5A Adverse effect of glucocorticoids and synthetic analogues, initial encounter: Secondary | ICD-10-CM | POA: Diagnosis present

## 2024-03-02 DIAGNOSIS — R29707 NIHSS score 7: Secondary | ICD-10-CM | POA: Diagnosis not present

## 2024-03-02 DIAGNOSIS — G44209 Tension-type headache, unspecified, not intractable: Secondary | ICD-10-CM | POA: Diagnosis present

## 2024-03-02 DIAGNOSIS — E872 Acidosis, unspecified: Secondary | ICD-10-CM | POA: Diagnosis present

## 2024-03-02 DIAGNOSIS — F17200 Nicotine dependence, unspecified, uncomplicated: Secondary | ICD-10-CM | POA: Diagnosis not present

## 2024-03-02 DIAGNOSIS — J302 Other seasonal allergic rhinitis: Secondary | ICD-10-CM | POA: Diagnosis not present

## 2024-03-02 DIAGNOSIS — I6350 Cerebral infarction due to unspecified occlusion or stenosis of unspecified cerebral artery: Secondary | ICD-10-CM | POA: Diagnosis not present

## 2024-03-02 DIAGNOSIS — I10 Essential (primary) hypertension: Secondary | ICD-10-CM | POA: Diagnosis present

## 2024-03-02 DIAGNOSIS — Z833 Family history of diabetes mellitus: Secondary | ICD-10-CM

## 2024-03-02 DIAGNOSIS — E0500 Thyrotoxicosis with diffuse goiter without thyrotoxic crisis or storm: Secondary | ICD-10-CM | POA: Diagnosis not present

## 2024-03-02 DIAGNOSIS — I69320 Aphasia following cerebral infarction: Secondary | ICD-10-CM | POA: Diagnosis not present

## 2024-03-02 DIAGNOSIS — Z720 Tobacco use: Secondary | ICD-10-CM | POA: Diagnosis not present

## 2024-03-02 DIAGNOSIS — R918 Other nonspecific abnormal finding of lung field: Secondary | ICD-10-CM | POA: Diagnosis present

## 2024-03-02 DIAGNOSIS — E870 Hyperosmolality and hypernatremia: Secondary | ICD-10-CM | POA: Diagnosis present

## 2024-03-02 DIAGNOSIS — R400 Somnolence: Secondary | ICD-10-CM | POA: Diagnosis not present

## 2024-03-02 DIAGNOSIS — I6389 Other cerebral infarction: Secondary | ICD-10-CM | POA: Diagnosis not present

## 2024-03-02 DIAGNOSIS — K219 Gastro-esophageal reflux disease without esophagitis: Secondary | ICD-10-CM | POA: Diagnosis present

## 2024-03-02 DIAGNOSIS — Z888 Allergy status to other drugs, medicaments and biological substances status: Secondary | ICD-10-CM

## 2024-03-02 DIAGNOSIS — J189 Pneumonia, unspecified organism: Secondary | ICD-10-CM | POA: Diagnosis not present

## 2024-03-02 DIAGNOSIS — Y92239 Unspecified place in hospital as the place of occurrence of the external cause: Secondary | ICD-10-CM | POA: Diagnosis present

## 2024-03-02 DIAGNOSIS — E0501 Thyrotoxicosis with diffuse goiter with thyrotoxic crisis or storm: Secondary | ICD-10-CM | POA: Diagnosis present

## 2024-03-02 DIAGNOSIS — R29705 NIHSS score 5: Secondary | ICD-10-CM | POA: Diagnosis not present

## 2024-03-02 DIAGNOSIS — R262 Difficulty in walking, not elsewhere classified: Secondary | ICD-10-CM | POA: Diagnosis present

## 2024-03-02 DIAGNOSIS — R1310 Dysphagia, unspecified: Secondary | ICD-10-CM | POA: Diagnosis present

## 2024-03-02 DIAGNOSIS — R471 Dysarthria and anarthria: Secondary | ICD-10-CM | POA: Diagnosis present

## 2024-03-02 DIAGNOSIS — Z781 Physical restraint status: Secondary | ICD-10-CM

## 2024-03-02 DIAGNOSIS — F17210 Nicotine dependence, cigarettes, uncomplicated: Secondary | ICD-10-CM | POA: Diagnosis present

## 2024-03-02 DIAGNOSIS — R7303 Prediabetes: Secondary | ICD-10-CM | POA: Diagnosis not present

## 2024-03-02 DIAGNOSIS — E0590 Thyrotoxicosis, unspecified without thyrotoxic crisis or storm: Secondary | ICD-10-CM

## 2024-03-02 DIAGNOSIS — K521 Toxic gastroenteritis and colitis: Secondary | ICD-10-CM | POA: Diagnosis present

## 2024-03-02 DIAGNOSIS — D72829 Elevated white blood cell count, unspecified: Secondary | ICD-10-CM | POA: Diagnosis present

## 2024-03-02 DIAGNOSIS — T3695XA Adverse effect of unspecified systemic antibiotic, initial encounter: Secondary | ICD-10-CM | POA: Diagnosis present

## 2024-03-02 DIAGNOSIS — I6381 Other cerebral infarction due to occlusion or stenosis of small artery: Secondary | ICD-10-CM | POA: Diagnosis not present

## 2024-03-02 DIAGNOSIS — R531 Weakness: Secondary | ICD-10-CM | POA: Diagnosis not present

## 2024-03-02 DIAGNOSIS — F419 Anxiety disorder, unspecified: Secondary | ICD-10-CM | POA: Diagnosis present

## 2024-03-02 DIAGNOSIS — R569 Unspecified convulsions: Secondary | ICD-10-CM | POA: Diagnosis not present

## 2024-03-02 DIAGNOSIS — R627 Adult failure to thrive: Secondary | ICD-10-CM | POA: Diagnosis present

## 2024-03-02 DIAGNOSIS — F17290 Nicotine dependence, other tobacco product, uncomplicated: Secondary | ICD-10-CM | POA: Diagnosis present

## 2024-03-02 DIAGNOSIS — I69391 Dysphagia following cerebral infarction: Secondary | ICD-10-CM | POA: Diagnosis not present

## 2024-03-02 LAB — CBC
HCT: 39.8 % (ref 39.0–52.0)
Hemoglobin: 13.1 g/dL (ref 13.0–17.0)
MCH: 30.1 pg (ref 26.0–34.0)
MCHC: 32.9 g/dL (ref 30.0–36.0)
MCV: 91.5 fL (ref 80.0–100.0)
Platelets: 256 K/uL (ref 150–400)
RBC: 4.35 MIL/uL (ref 4.22–5.81)
RDW: 12.7 % (ref 11.5–15.5)
WBC: 6 K/uL (ref 4.0–10.5)
nRBC: 0 % (ref 0.0–0.2)

## 2024-03-02 LAB — CBC WITH DIFFERENTIAL/PLATELET
Abs Immature Granulocytes: 0.02 K/uL (ref 0.00–0.07)
Basophils Absolute: 0 K/uL (ref 0.0–0.1)
Basophils Relative: 0 %
Eosinophils Absolute: 0.1 K/uL (ref 0.0–0.5)
Eosinophils Relative: 1 %
HCT: 41.5 % (ref 39.0–52.0)
Hemoglobin: 14.2 g/dL (ref 13.0–17.0)
Immature Granulocytes: 0 %
Lymphocytes Relative: 16 %
Lymphs Abs: 1 K/uL (ref 0.7–4.0)
MCH: 30.2 pg (ref 26.0–34.0)
MCHC: 34.2 g/dL (ref 30.0–36.0)
MCV: 88.3 fL (ref 80.0–100.0)
Monocytes Absolute: 0.6 K/uL (ref 0.1–1.0)
Monocytes Relative: 10 %
Neutro Abs: 4.5 K/uL (ref 1.7–7.7)
Neutrophils Relative %: 73 %
Platelets: 276 K/uL (ref 150–400)
RBC: 4.7 MIL/uL (ref 4.22–5.81)
RDW: 12.5 % (ref 11.5–15.5)
WBC: 6.2 K/uL (ref 4.0–10.5)
nRBC: 0 % (ref 0.0–0.2)

## 2024-03-02 LAB — COMPREHENSIVE METABOLIC PANEL WITH GFR
ALT: 56 U/L — ABNORMAL HIGH (ref 0–44)
AST: 39 U/L (ref 15–41)
Albumin: 3.4 g/dL — ABNORMAL LOW (ref 3.5–5.0)
Alkaline Phosphatase: 93 U/L (ref 38–126)
Anion gap: 19 — ABNORMAL HIGH (ref 5–15)
BUN: 105 mg/dL — ABNORMAL HIGH (ref 8–23)
CO2: 19 mmol/L — ABNORMAL LOW (ref 22–32)
Calcium: 9.7 mg/dL (ref 8.9–10.3)
Chloride: 100 mmol/L (ref 98–111)
Creatinine, Ser: 4.14 mg/dL — ABNORMAL HIGH (ref 0.61–1.24)
GFR, Estimated: 15 mL/min — ABNORMAL LOW (ref >60)
Glucose, Bld: 139 mg/dL — ABNORMAL HIGH (ref 70–99)
Potassium: 4.5 mmol/L (ref 3.5–5.1)
Sodium: 138 mmol/L (ref 135–145)
Total Bilirubin: 1.6 mg/dL — ABNORMAL HIGH (ref 0.0–1.2)
Total Protein: 7.1 g/dL (ref 6.5–8.1)

## 2024-03-02 LAB — CREATININE, SERUM
Creatinine, Ser: 3.8 mg/dL — ABNORMAL HIGH (ref 0.61–1.24)
GFR, Estimated: 17 mL/min — ABNORMAL LOW (ref >60)

## 2024-03-02 LAB — CBG MONITORING, ED: Glucose-Capillary: 155 mg/dL — ABNORMAL HIGH (ref 70–99)

## 2024-03-02 LAB — TROPONIN I (HIGH SENSITIVITY)
Troponin I (High Sensitivity): 10 ng/L (ref <18)
Troponin I (High Sensitivity): 9 ng/L (ref <18)

## 2024-03-02 LAB — I-STAT CG4 LACTIC ACID, ED
Lactic Acid, Venous: 1.1 mmol/L (ref 0.5–1.9)
Lactic Acid, Venous: 1.6 mmol/L (ref 0.5–1.9)

## 2024-03-02 LAB — TSH: TSH: <0.1 u[IU]/mL — ABNORMAL LOW (ref 0.350–4.500)

## 2024-03-02 LAB — ETHANOL: Alcohol, Ethyl (B): <15 mg/dL (ref <15)

## 2024-03-02 LAB — HIV ANTIBODY (ROUTINE TESTING W REFLEX): HIV Screen 4th Generation wRfx: NONREACTIVE

## 2024-03-02 MED ORDER — FOLIC ACID 1 MG PO TABS
1.0000 mg | ORAL_TABLET | Freq: Every day | ORAL | Status: DC
  Administered 2024-03-03 – 2024-03-05 (×3): 1 mg via ORAL
  Filled 2024-03-02 (×3): qty 1

## 2024-03-02 MED ORDER — SODIUM CHLORIDE 0.9 % IV BOLUS
1000.0000 mL | Freq: Once | INTRAVENOUS | Status: AC
  Administered 2024-03-02: 1000 mL via INTRAVENOUS

## 2024-03-02 MED ORDER — TOPIRAMATE 25 MG PO TABS
100.0000 mg | ORAL_TABLET | Freq: Two times a day (BID) | ORAL | Status: DC
  Administered 2024-03-02 – 2024-03-05 (×6): 100 mg via ORAL
  Filled 2024-03-02 (×6): qty 4

## 2024-03-02 MED ORDER — ADULT MULTIVITAMIN W/MINERALS CH
1.0000 | ORAL_TABLET | Freq: Every day | ORAL | Status: DC
  Administered 2024-03-03 – 2024-03-05 (×3): 1 via ORAL
  Filled 2024-03-02 (×3): qty 1

## 2024-03-02 MED ORDER — ATORVASTATIN CALCIUM 10 MG PO TABS
20.0000 mg | ORAL_TABLET | Freq: Every day | ORAL | Status: DC
  Administered 2024-03-03: 20 mg via ORAL
  Filled 2024-03-02: qty 2

## 2024-03-02 MED ORDER — ESCITALOPRAM OXALATE 10 MG PO TABS
20.0000 mg | ORAL_TABLET | Freq: Every day | ORAL | Status: DC
  Administered 2024-03-04 – 2024-03-05 (×2): 20 mg via ORAL
  Filled 2024-03-02 (×2): qty 2

## 2024-03-02 MED ORDER — THIAMINE MONONITRATE 100 MG PO TABS
100.0000 mg | ORAL_TABLET | Freq: Every day | ORAL | Status: DC
Start: 2024-03-03 — End: 2024-03-04
  Administered 2024-03-03 – 2024-03-04 (×2): 100 mg via ORAL
  Filled 2024-03-02 (×2): qty 1

## 2024-03-02 MED ORDER — ALBUTEROL SULFATE (2.5 MG/3ML) 0.083% IN NEBU: 2.5000 mg | INHALATION_SOLUTION | Freq: Four times a day (QID) | RESPIRATORY_TRACT | Status: DC | PRN

## 2024-03-02 MED ORDER — HEPARIN SODIUM (PORCINE) 5000 UNIT/ML IJ SOLN
5000.0000 [IU] | Freq: Three times a day (TID) | INTRAMUSCULAR | Status: DC
  Administered 2024-03-02 – 2024-03-12 (×29): 5000 [IU] via SUBCUTANEOUS
  Filled 2024-03-02 (×30): qty 1

## 2024-03-02 MED ORDER — POLYETHYLENE GLYCOL 3350 17 G PO PACK: 17.0000 g | PACK | Freq: Every day | ORAL | Status: DC | PRN

## 2024-03-02 MED ORDER — ASPIRIN 81 MG PO TBEC
81.0000 mg | DELAYED_RELEASE_TABLET | Freq: Every day | ORAL | Status: DC
  Administered 2024-03-03 – 2024-03-05 (×3): 81 mg via ORAL
  Filled 2024-03-02 (×3): qty 1

## 2024-03-02 MED ORDER — MELATONIN 5 MG PO TABS: 5.0000 mg | ORAL_TABLET | Freq: Every evening | ORAL | Status: DC | PRN

## 2024-03-02 MED ORDER — SODIUM CHLORIDE 0.9 % IV SOLN
INTRAVENOUS | Status: DC
Start: 2024-03-02 — End: 2024-03-02

## 2024-03-02 MED ORDER — HALOPERIDOL LACTATE 5 MG/ML IJ SOLN
1.0000 mg | Freq: Once | INTRAMUSCULAR | Status: AC
  Administered 2024-03-02: 1 mg via INTRAVENOUS
  Filled 2024-03-02: qty 1

## 2024-03-02 MED ORDER — SODIUM CHLORIDE 0.9 % IV SOLN: INTRAVENOUS | Status: DC

## 2024-03-02 MED ORDER — PROCHLORPERAZINE EDISYLATE 10 MG/2ML IJ SOLN
5.0000 mg | Freq: Four times a day (QID) | INTRAMUSCULAR | Status: DC | PRN
  Filled 2024-03-02: qty 2

## 2024-03-02 MED ORDER — ACETAMINOPHEN 325 MG PO TABS
650.0000 mg | ORAL_TABLET | Freq: Four times a day (QID) | ORAL | Status: DC | PRN
  Administered 2024-03-04: 650 mg via ORAL
  Filled 2024-03-02: qty 2

## 2024-03-02 NOTE — ED Notes (Signed)
 Pt unable to provide urine at this time, urinal at bedside, juice and water  given

## 2024-03-02 NOTE — H&P (Addendum)
 History and Physical  Casey Warren FMW:[id number] DOB: 05/09/1961 DOA: 03/02/2024  Referring physician: Dr. Zackowski, EDP  PCP: Celestia Rosaline SQUIBB, NP  Outpatient Specialists: None Patient coming from: Home  Chief Complaint: Low blood pressures   HPI: Casey Warren is a 63 y.o. male with medical history significant for hypertension, hyperlipidemia, chronic anxiety/depression, alcoholism, who presents to the ER, referred by his PCP due to low blood pressures of unclear etiology.  Per family members at bedside he has cut down on alcohol consumption.  Last alcoholic drink was about a week ago.  No reported subjective fevers or chills.  In the ER, hypotensive with SBP's in the 70's.  Creatinine elevated 4.14 above baseline.  Alert and oriented x 2.  Received 2L IVF with improvement of his BP.    Non-contrast CT head revealed generalized cerebral atrophy with chronic inferior cerebellar infarcts and chronic lacunar infarcts within the pons and basal ganglia on the right.  No acute intracranial abnormality.    Per patient's family members at bedside, his wife and daughter, his mentation is at baseline.  He has had a decline in memory for years.  ED Course: Temperature 98.3.  BP 91/58, pulse 106, respiratory 25, O2 saturation 94% on room air.  Review of Systems: Review of systems as noted in the HPI. All other systems reviewed and are negative.   Past Medical History:  Diagnosis Date   Angio-edema    Anxiety    related to surgery   Asthma    GERD (gastroesophageal reflux disease)    History of blood transfusion    HTN (hypertension)    Seizures (HCC)    last seizure in 2023 per patient   Ulcer    Past Surgical History:  Procedure Laterality Date   CLOSED REDUCTION NASAL FRACTURE  11/19/2011   Procedure: CLOSED REDUCTION NASAL FRACTURE;  Surgeon: Marlyce Finer, MD;  Location: [facility] OR;  Service: ENT;  Laterality: N/A;  CLOSED REDUCTION NASAL AND NASAL SEPTAL FRACTURE WITH  STABILIZATION   No surgical      Social History:  reports that he has been smoking cigars and cigarettes. He has never been exposed to tobacco smoke. He has never used smokeless tobacco. He reports that he does not currently use alcohol. He reports that he does not use drugs.   Allergies  Allergen Reactions   Citrus Swelling   Fruit Extracts Swelling    Pt experiences swelling with most fruits   Chlorthalidone  Other (See Comments)    Hyponatremia    Family History  Problem Relation Age of Onset   Stroke Mother        Died age 17   Hypertension Mother    Diabetes Mother    Breast cancer Mother    Leukemia Father        Died age 50   Allergic rhinitis Sister    Asthma Grandson    Colon cancer Neg Hx       Prior to Admission medications   Medication Sig Start Date End Date Taking? Authorizing Provider  topiramate  (TOPAMAX ) 100 MG tablet Take 100 mg by mouth 2 (two) times daily. 02/06/24  Yes [provider]  albuterol  (VENTOLIN  HFA) 108 (90 Base) MCG/ACT inhaler Inhale 2 puffs into the lungs every 6 (six) hours as needed for wheezing or shortness of breath. 02/13/23   Celestia Rosaline SQUIBB, NP  amLODipine  (NORVASC ) 10 MG tablet Take 1 tablet (10 mg total) by mouth daily. 07/19/22   Celestia Rosaline SQUIBB,  NP  aspirin  EC 81 MG tablet Take 81 mg by mouth daily.    [provider]  atorvastatin  (LIPITOR) 20 MG tablet TAKE 1 TABLET BY MOUTH EVERY DAY 12/20/23   Celestia Rosaline SQUIBB, NP  cyclobenzaprine  (FLEXERIL ) 10 MG tablet Take 1 tablet (10 mg total) by mouth 3 (three) times daily as needed for muscle spasms. 02/16/24   Gregg Lek, MD  EPINEPHrine  0.3 mg/0.3 mL IJ SOAJ injection Inject 0.3 mg into the muscle as needed for anaphylaxis. 08/10/23   Celestia Rosaline SQUIBB, NP  escitalopram  (LEXAPRO ) 20 MG tablet Take 1 tablet (20 mg total) by mouth daily. 07/29/23   Celestia Rosaline SQUIBB, NP  hydrochlorothiazide  (HYDRODIURIL ) 12.5 MG tablet Take 12.5 mg by mouth daily.    [provider]  loratadine  (CLARITIN ) 10 MG tablet TAKE 1 TABLET BY MOUTH EVERY DAY AS NEEDED FOR ALLERGY 09/22/23   Celestia Rosaline SQUIBB, NP  valsartan  (DIOVAN ) 40 MG tablet Take 1 tablet (40 mg total) by mouth daily. 07/29/23   Celestia Rosaline SQUIBB, NP    Physical Exam: BP 103/61 (BP Location: Right Arm)   Pulse (!) 106   Temp 98.5 F (36.9 C) (Oral)   Resp (!) 23   SpO2 94%   General: 64 y.o. year-old male well developed well nourished in no acute distress.  Alert and oriented x 2. Cardiovascular: Regular rate and rhythm with no rubs or gallops.  No thyromegaly or JVD noted.  No lower extremity edema. 2/4 pulses in all 4 extremities. Respiratory: Clear to auscultation with no wheezes or rales. Good inspiratory effort. Abdomen: Soft nontender nondistended with normal bowel sounds x4 quadrants. Muskuloskeletal: No cyanosis, clubbing or edema noted bilaterally Neuro: CN II-XII intact, strength, sensation, reflexes Skin: No ulcerative lesions noted or rashes Psychiatry: Judgement and insight appear altered. Mood is anxious.          Labs on Admission:  Basic Metabolic Panel: Recent Labs  Lab 03/02/24 1634  NA 138  K 4.5  CL 100  CO2 19*  GLUCOSE 139*  BUN 105*  CREATININE 4.14*  CALCIUM  9.7   Liver Function Tests: Recent Labs  Lab 03/02/24 1634  AST 39  ALT 56*  ALKPHOS 93  BILITOT 1.6*  PROT 7.1  ALBUMIN 3.4*   No results for input(s): LIPASE, AMYLASE in the last 168 hours. No results for input(s): AMMONIA in the last 168 hours. CBC: Recent Labs  Lab 03/02/24 1634  WBC 6.2  NEUTROABS 4.5  HGB 14.2  HCT 41.5  MCV 88.3  PLT 276   Cardiac Enzymes: No results for input(s): CKTOTAL, CKMB, CKMBINDEX, TROPONINI in the last 168 hours.  BNP (last 3 results) No results for input(s): BNP in the last 8760 hours.  ProBNP (last 3 results) No results for input(s): PROBNP in the last 8760 hours.  CBG: Recent Labs  Lab 03/02/24 1602  GLUCAP 155*     Radiological Exams on Admission: CT CHEST ABDOMEN PELVIS WO CONTRAST Result Date: 03/02/2024 CLINICAL DATA:  Pneumonia, complication suspected, xray done. Hypotension, altered mental state. EXAM: CT CHEST, ABDOMEN AND PELVIS WITHOUT CONTRAST TECHNIQUE: Multidetector CT imaging of the chest, abdomen and pelvis was performed following the standard protocol without IV contrast. RADIATION DOSE REDUCTION: This exam was performed according to the departmental dose-optimization program which includes automated exposure control, adjustment of the mA and/or kV according to patient size and/or use of iterative reconstruction technique. COMPARISON:  None Available. FINDINGS: CT CHEST FINDINGS Cardiovascular: Heart is normal size. Aorta is  normal caliber. Coronary artery and aortic atherosclerosis. Mediastinum/Nodes: No mediastinal, hilar, or axillary adenopathy. Trachea and esophagus are unremarkable. Thyroid  unremarkable. Lungs/Pleura: Mild centrilobular emphysema. Several small scattered pulmonary nodules. Index lingular nodule on image 72 measures 3 mm. Posterior right upper lobe nodule measures 5 mm on image 43. Few other scattered 1-2 mm nodules. No confluent airspace opacities or effusions. Musculoskeletal: Chest wall soft tissues are unremarkable. Chronic wedge deformity of the T11 vertebral body, stable since 01/13/2017 chest x-ray. No acute bony abnormality. CT ABDOMEN PELVIS FINDINGS Hepatobiliary: No focal hepatic abnormality. Gallbladder unremarkable. Pancreas: No focal abnormality or ductal dilatation. Spleen: No focal abnormality.  Normal size. Adrenals/Urinary Tract: No adrenal abnormality. No focal renal abnormality. No stones or hydronephrosis. Urinary bladder is unremarkable. Stomach/Bowel: Scattered colonic diverticulosis. No active diverticulitis. Normal appendix. Stomach and small bowel decompressed. No bowel obstruction or inflammatory process. Vascular/Lymphatic: Aortic atherosclerosis. No  evidence of aneurysm or adenopathy. Reproductive: No visible focal abnormality. Other: No free fluid or free air. Musculoskeletal: No acute bony abnormality. IMPRESSION: No acute findings in the chest, abdomen or pelvis. Scattered bilateral pulmonary nodules measuring up to 5 mm. No follow-up needed if patient is low-risk (and has no known or suspected primary neoplasm). Non-contrast chest CT can be considered in 12 months if patient is high-risk. This recommendation follows the consensus statement: Guidelines for Management of Incidental Pulmonary Nodules Detected on CT Images: From the Fleischner Society 2017; Radiology 2017; 284:228-243. Coronary artery disease.  Aortic atherosclerosis. Sigmoid diverticulosis. Electronically Signed   By: Franky Crease M.D.   On: 03/02/2024 19:35   CT Head Wo Contrast Result Date: 03/02/2024 CLINICAL DATA:  Altered mental status. EXAM: CT HEAD WITHOUT CONTRAST TECHNIQUE: Contiguous axial images were obtained from the base of the skull through the vertex without intravenous contrast. RADIATION DOSE REDUCTION: This exam was performed according to the departmental dose-optimization program which includes automated exposure control, adjustment of the mA and/or kV according to patient size and/or use of iterative reconstruction technique. COMPARISON:  August 09, 2023 FINDINGS: Brain: There is generalized cerebral atrophy with widening of the extra-axial spaces and ventricular dilatation. There are areas of decreased attenuation within the white matter tracts of the supratentorial brain, consistent with microvascular disease changes. Chronic inferior cerebellar infarcts are seen with chronic lacunar infarcts noted within the pons and basal ganglia on the right. Vascular: Marked severity bilateral cavernous carotid artery calcification is noted. Skull: Normal. Negative for fracture or focal lesion. Sinuses/Orbits: No acute finding. Other: None. IMPRESSION: 1. Generalized cerebral atrophy  with chronic inferior cerebellar infarcts and chronic lacunar infarcts within the pons and basal ganglia on the right. 2. No acute intracranial abnormality. Electronically Signed   By: Suzen Dials M.D.   On: 03/02/2024 19:33   DG Chest Port 1 View Result Date: 03/02/2024 CLINICAL DATA:  Hypotension altered mental state EXAM: PORTABLE CHEST - 1 VIEW COMPARISON:  July 01, 2022 FINDINGS: Lower lung volumes. No focal airspace consolidation, pleural effusion, or pneumothorax. No cardiomegaly. No acute fracture or destructive lesions. Multilevel thoracic osteophytosis. IMPRESSION: No acute cardiopulmonary abnormality. Electronically Signed   By: Rogelia Myers M.D.   On: 03/02/2024 16:56    EKG: I independently viewed the EKG done and my findings are as followed: Sinus tachycardia rate of 115, nonspecific ST-T changes.  QTc 452.  Assessment/Plan Present on Admission:  Hypotension  Principal Problem:   Hypotension  Hypotension, unclear etiology Follow TSH, a.m. cortisol level Orthostatic vital signs in the morning Continue IV fluid Fall precautions.  History of CVA Non-contrast CT head revealed generalized cerebral atrophy with chronic inferior cerebellar infarcts and chronic lacunar infarcts within the pons and basal ganglia on the right.  No acute intracranial abnormality.   Resume home at home Lipitor aspirin .  History of hypertension, now hypotensive Hold off home oral antihypertensive due to hypotension. Continue IV fluids NS at 75 cc/h x 2 days Closely monitor vital signs Maintain MAP greater than 65 Rule out other causes, follow UA, chest x-ray was nonacute  Alcohol abuse Endorses no alcohol intake in 1 week. Multivitamin, folic acid  and thiamine  supplement  Ambulatory dysfunction PT OT assessment Fall precautions.  Chronic anxiety/depression Resume home regimen   Time: 75 minutes.   DVT prophylaxis: Subcu Lovenox  daily.  Code Status: Full code.  Family  Communication: Wife and daughter at bedside.  Disposition Plan: Admitted to telemetry medical unit.  Consults called: None.  Admission status: Inpatient status.   Status is: Inpatient The patient requires at least 2 midnights for further evaluation and treatment of present condition.   Terry LOISE Hurst MD [facility] Pager [phone]  If 7PM-7AM, please contact night-coverage [email or web address] Password [email or web address]  03/02/2024, 8:35 PM

## 2024-03-02 NOTE — ED Notes (Signed)
 Assuming pt care, pt walk in referred by pcp for weight loss, weakness and declining mental status, per family has hx of alcohol abuse, recently stopped drinking alcohol, per daughter pt has been confused/altered for 2 years, pt non compliant to meds and self care. Per family pt med hx. Htn, dm, chl. Call bell within reach, family at bedside

## 2024-03-02 NOTE — ED Triage Notes (Signed)
 Patient arrives with wife- patient unable to answer questions and barely responsive in triage. Wife states he has been having weakness, very shaky and low blood pressure. States he had a bowel movement on himself today. Patient was originally standing when first arrived and assisted into a wheelchair. Hypotensive on arrival. Charge notified. EDP in triage at bedside. CBG 155.

## 2024-03-02 NOTE — ED Provider Notes (Addendum)
 [place] EMERGENCY DEPARTMENT AT [facility] Provider Note   CSN: [id number] Arrival date & time: 03/02/24  1553     Patient presents with: Weakness   Casey Warren is a 63 y.o. male.   Patient accompanied by wife.  Says that his blood pressures have been low for several days almost 2 weeks.  Went to primary care doctor today and they noted that his blood pressure there was supposedly 110 systolic.  Heart rate was 120.  He was referred in.  Patient's wife says has not been eating or drinking very well.  But denies any nausea or vomiting or diarrhea or any cough or any fevers.  Past medical history significant for history of blood transfusions history of stomach ulcer anxiety gastroesophageal reflux disease hypertension asthma and seizures last seizure was in 2023.  Patient is an everyday smoker.  According to patient's wife patient stopped drinking alcohol the end of August.       Prior to Admission medications   Medication Sig Start Date End Date Taking? Authorizing Provider  topiramate  (TOPAMAX ) 100 MG tablet Take 100 mg by mouth 2 (two) times daily. 02/06/24  Yes [provider]  albuterol  (VENTOLIN  HFA) 108 (90 Base) MCG/ACT inhaler Inhale 2 puffs into the lungs every 6 (six) hours as needed for wheezing or shortness of breath. 02/13/23   Celestia Rosaline SQUIBB, NP  amLODipine  (NORVASC ) 10 MG tablet Take 1 tablet (10 mg total) by mouth daily. 07/19/22   Celestia Rosaline SQUIBB, NP  aspirin  EC 81 MG tablet Take 81 mg by mouth daily.    [provider]  atorvastatin  (LIPITOR) 20 MG tablet TAKE 1 TABLET BY MOUTH EVERY DAY 12/20/23   Celestia Rosaline SQUIBB, NP  cyclobenzaprine  (FLEXERIL ) 10 MG tablet Take 1 tablet (10 mg total) by mouth 3 (three) times daily as needed for muscle spasms. 02/16/24   Gregg Lek, MD  EPINEPHrine  0.3 mg/0.3 mL IJ SOAJ injection Inject 0.3 mg into the muscle as needed for anaphylaxis. 08/10/23   Celestia Rosaline SQUIBB, NP  escitalopram  (LEXAPRO )  20 MG tablet Take 1 tablet (20 mg total) by mouth daily. 07/29/23   Celestia Rosaline SQUIBB, NP  hydrochlorothiazide  (HYDRODIURIL ) 12.5 MG tablet Take 12.5 mg by mouth daily.    [provider]  loratadine  (CLARITIN ) 10 MG tablet TAKE 1 TABLET BY MOUTH EVERY DAY AS NEEDED FOR ALLERGY 09/22/23   Celestia Rosaline SQUIBB, NP  valsartan  (DIOVAN ) 40 MG tablet Take 1 tablet (40 mg total) by mouth daily. 07/29/23   Celestia Rosaline SQUIBB, NP    Allergies: Citrus, Fruit extracts, and Chlorthalidone     Review of Systems  Constitutional:  Positive for activity change and appetite change. Negative for chills and fever.  HENT:  Negative for ear pain and sore throat.   Eyes:  Negative for pain and visual disturbance.  Respiratory:  Negative for cough and shortness of breath.   Cardiovascular:  Negative for chest pain and palpitations.  Gastrointestinal:  Negative for abdominal pain and vomiting.  Genitourinary:  Negative for dysuria and hematuria.  Musculoskeletal:  Negative for arthralgias and back pain.  Skin:  Negative for color change and rash.  Neurological:  Positive for syncope and light-headedness. Negative for seizures.  Psychiatric/Behavioral:  Positive for confusion.   All other systems reviewed and are negative.   Updated Vital Signs BP (!) 89/69 (BP Location: Left Arm)   Pulse (!) 106   Temp 98.5 F (36.9 C) (Oral)   Resp (!) 21  SpO2 100%   Physical Exam Vitals and nursing note reviewed.  Constitutional:      General: He is not in acute distress.    Appearance: Normal appearance. He is well-developed. He is ill-appearing.  HENT:     Head: Normocephalic and atraumatic.     Mouth/Throat:     Mouth: Mucous membranes are dry.  Eyes:     Conjunctiva/sclera: Conjunctivae normal.  Cardiovascular:     Rate and Rhythm: Normal rate and regular rhythm.     Heart sounds: No murmur heard. Pulmonary:     Effort: Pulmonary effort is normal. No respiratory distress.     Breath sounds:  Normal breath sounds.  Abdominal:     Palpations: Abdomen is soft.     Tenderness: There is no abdominal tenderness.  Musculoskeletal:        General: No swelling.     Cervical back: Normal range of motion and neck supple.  Skin:    General: Skin is warm and dry.     Capillary Refill: Capillary refill takes less than 2 seconds.  Neurological:     General: No focal deficit present.     Mental Status: He is alert.     Cranial Nerves: No cranial nerve deficit.     Sensory: No sensory deficit.     Motor: No weakness.     Comments: Patient is confused to age year birth.  Psychiatric:        Mood and Affect: Mood normal.     (all labs ordered are listed, but only abnormal results are displayed) Labs Reviewed  COMPREHENSIVE METABOLIC PANEL WITH GFR - Abnormal; Notable for the following components:      Result Value   CO2 19 (*)    Glucose, Bld 139 (*)    BUN 105 (*)    Creatinine, Ser 4.14 (*)    Albumin 3.4 (*)    ALT 56 (*)    Total Bilirubin 1.6 (*)    GFR, Estimated 15 (*)    Anion gap 19 (*)    All other components within normal limits  CBG MONITORING, ED - Abnormal; Notable for the following components:   Glucose-Capillary 155 (*)    All other components within normal limits  CULTURE, BLOOD (ROUTINE X 2)  CULTURE, BLOOD (ROUTINE X 2)  CBC WITH DIFFERENTIAL/PLATELET  ETHANOL  LIPASE, BLOOD  URINALYSIS, ROUTINE W REFLEX MICROSCOPIC  I-STAT CG4 LACTIC ACID, ED  I-STAT CG4 LACTIC ACID, ED  I-STAT CG4 LACTIC ACID, ED  TROPONIN I (HIGH SENSITIVITY)  TROPONIN I (HIGH SENSITIVITY)    EKG: EKG Interpretation Date/Time:  Tuesday March 02 2024 16:01:36 EDT Ventricular Rate:  119 PR Interval:  162 QRS Duration:  68 QT Interval:  324 QTC Calculation: 455 R Axis:   47  Text Interpretation: Sinus tachycardia Otherwise normal ECG When compared with ECG of 10-Mar-2023 16:16, PREVIOUS ECG IS PRESENT Confirmed by Gladiola Madore [phone]) on 03/02/2024 4:15:01  PM  Radiology: [facility] Chest Port 1 View Result Date: 03/02/2024 CLINICAL DATA:  Hypotension altered mental state EXAM: PORTABLE CHEST - 1 VIEW COMPARISON:  July 01, 2022 FINDINGS: Lower lung volumes. No focal airspace consolidation, pleural effusion, or pneumothorax. No cardiomegaly. No acute fracture or destructive lesions. Multilevel thoracic osteophytosis. IMPRESSION: No acute cardiopulmonary abnormality. Electronically Signed   By: Rogelia Myers M.D.   On: 03/02/2024 16:56     Procedures   Medications Ordered in the ED  0.9 %  sodium chloride  infusion ( Intravenous New Bag/Given  03/02/24 1639)  sodium chloride  0.9 % bolus 1,000 mL (1,000 mLs Intravenous New Bag/Given 03/02/24 1640)                                    Medical Decision Making Amount and/or Complexity of Data Reviewed Labs: ordered. Radiology: ordered.  Risk Prescription drug management. Decision regarding hospitalization.   Patient arrived by POV from physicians office.  Blood pressure here was in the 70s on arrival tachycardic sinus tachycardia 114.  Patient appears very dehydrated.  Patient's family member says is not been eating and drinking very well his blood pressures actually been low for several days.  Apparently used to drink alcohol stop at the end of August.  Patient is awake and will follow commands but there is some degree of confusion.  CRITICAL CARE Performed by: Kasidi Shanker Total critical care time: 45 minutes Critical care time was exclusive of separately billable procedures and treating other patients. Critical care was necessary to treat or prevent imminent or life-threatening deterioration. Critical care was time spent personally by me on the following activities: development of treatment plan with patient and/or surrogate as well as nursing, discussions with consultants, evaluation of patient's response to treatment, examination of patient, obtaining history from patient or surrogate,  ordering and performing treatments and interventions, ordering and review of laboratory studies, ordering and review of radiographic studies, pulse oximetry and re-evaluation of patient's condition.  Lactic acid 1.1.  CBC complete metabolic panel lipase pending.  Blood sugar is 155.  Initiated fluid resuscitation.  Blood pressure now currently 81.  We did check blood pressures in both arms and both were low.  Patient is abdomen soft nontender.  Patient denies any nausea or vomiting or diarrhea.  But did have syncopal episodes at home.  Patient initially not meeting sepsis criteria.  Lactic acid is normal.  The complete metabolic panel glucose 139 CO2 19 creatinine 4.14 for GFR of 15 so there is also acute kidney injury.  Could be prerenal.  But will go ahead and scan.  Total bili 1.6 ALT 16 alk phos normal at that 93.  Potassium 4.5 alcohol less than 15 troponin 10.  And chest x-ray without any acute findings.  Based on this we will go ahead CT abdomen pelvis will CT chest and CT his head as well since there was some confusion.  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  CT chest abdomen and pelvis without contrast no acute findings in the chest abdomen pelvis.  Scattered bilateral pulmonary nodules measuring up to 5 mm no follow-up needed noncontrast CT chest can be considered in 12 months since he is a smoker.  Coronary disease and sigmoid diverticulosis.  So it appears that patient that the hypotension and acute kidney injury.  Will contact admitting team.  Will have him recheck blood pressure when he comes back from using the bathroom.  With the last one I saw was about systolic 100.  They have not been recording the blood pressures recently.  Patient is able to ambulate without any abnormalities or difficulties.   Final diagnoses:  Hypotension, unspecified hypotension type   AKI (acute kidney injury)    ED Discharge Orders     None          Geraldene Hamilton, MD 03/02/24 1705  Zurich Carreno, MD 03/02/24 1824    Elasia Furnish, MD 03/02/24 [place]    Arnel Wymer, MD 03/02/24 2012

## 2024-03-03 ENCOUNTER — Other Ambulatory Visit: Payer: Self-pay

## 2024-03-03 ENCOUNTER — Inpatient Hospital Stay (HOSPITAL_COMMUNITY)

## 2024-03-03 DIAGNOSIS — N179 Acute kidney failure, unspecified: Secondary | ICD-10-CM | POA: Diagnosis not present

## 2024-03-03 DIAGNOSIS — R400 Somnolence: Secondary | ICD-10-CM | POA: Diagnosis not present

## 2024-03-03 DIAGNOSIS — E876 Hypokalemia: Secondary | ICD-10-CM

## 2024-03-03 DIAGNOSIS — G9341 Metabolic encephalopathy: Secondary | ICD-10-CM

## 2024-03-03 DIAGNOSIS — I959 Hypotension, unspecified: Secondary | ICD-10-CM | POA: Diagnosis not present

## 2024-03-03 LAB — BASIC METABOLIC PANEL WITH GFR
Anion gap: 10 (ref 5–15)
Anion gap: 13 (ref 5–15)
BUN: 86 mg/dL — ABNORMAL HIGH (ref 8–23)
BUN: 71 mg/dL — ABNORMAL HIGH (ref 8–23)
CO2: 19 mmol/L — ABNORMAL LOW (ref 22–32)
CO2: 16 mmol/L — ABNORMAL LOW (ref 22–32)
Calcium: 8.5 mg/dL — ABNORMAL LOW (ref 8.9–10.3)
Calcium: 9.4 mg/dL (ref 8.9–10.3)
Chloride: 110 mmol/L (ref 98–111)
Chloride: 113 mmol/L — ABNORMAL HIGH (ref 98–111)
Creatinine, Ser: 2.63 mg/dL — ABNORMAL HIGH (ref 0.61–1.24)
Creatinine, Ser: 1.97 mg/dL — ABNORMAL HIGH (ref 0.61–1.24)
GFR, Estimated: 26 mL/min — ABNORMAL LOW (ref >60)
GFR, Estimated: 37 mL/min — ABNORMAL LOW (ref >60)
Glucose, Bld: 114 mg/dL — ABNORMAL HIGH (ref 70–99)
Glucose, Bld: 113 mg/dL — ABNORMAL HIGH (ref 70–99)
Potassium: 3.3 mmol/L — ABNORMAL LOW (ref 3.5–5.1)
Potassium: 4.2 mmol/L (ref 3.5–5.1)
Sodium: 139 mmol/L (ref 135–145)
Sodium: 142 mmol/L (ref 135–145)

## 2024-03-03 LAB — URINALYSIS, ROUTINE W REFLEX MICROSCOPIC
Bilirubin Urine: NEGATIVE
Glucose, UA: NEGATIVE mg/dL
Hgb urine dipstick: NEGATIVE
Ketones, ur: NEGATIVE mg/dL
Leukocytes,Ua: NEGATIVE
Nitrite: NEGATIVE
Protein, ur: NEGATIVE mg/dL
Specific Gravity, Urine: 1.011 (ref 1.005–1.030)
pH: 5 (ref 5.0–8.0)

## 2024-03-03 LAB — BLOOD GAS, ARTERIAL
Acid-base deficit: 5.3 mmol/L — ABNORMAL HIGH (ref 0.0–2.0)
Bicarbonate: 19.1 mmol/L — ABNORMAL LOW (ref 20.0–28.0)
Drawn by: 33176
O2 Saturation: 99.1 %
Patient temperature: 36.6
pCO2 arterial: 32 mmHg (ref 32–48)
pH, Arterial: 7.38 (ref 7.35–7.45)
pO2, Arterial: 106 mmHg (ref 83–108)

## 2024-03-03 LAB — ACTH STIMULATION, 3 TIME POINTS
Cortisol, 30 Min: 21 ug/dL
Cortisol, 60 Min: 26.2 ug/dL
Cortisol, Base: 7.1 ug/dL

## 2024-03-03 LAB — CBC
HCT: 32.6 % — ABNORMAL LOW (ref 39.0–52.0)
Hemoglobin: 11.1 g/dL — ABNORMAL LOW (ref 13.0–17.0)
MCH: 30.2 pg (ref 26.0–34.0)
MCHC: 34 g/dL (ref 30.0–36.0)
MCV: 88.6 fL (ref 80.0–100.0)
Platelets: 211 K/uL (ref 150–400)
RBC: 3.68 MIL/uL — ABNORMAL LOW (ref 4.22–5.81)
RDW: 12.8 % (ref 11.5–15.5)
WBC: 5.3 K/uL (ref 4.0–10.5)
nRBC: 0 % (ref 0.0–0.2)

## 2024-03-03 LAB — PHOSPHORUS: Phosphorus: 5 mg/dL — ABNORMAL HIGH (ref 2.5–4.6)

## 2024-03-03 LAB — CORTISOL-AM, BLOOD: Cortisol - AM: 2.4 ug/dL — ABNORMAL LOW (ref 6.7–22.6)

## 2024-03-03 LAB — T4, FREE: Free T4: 3.25 ng/dL — ABNORMAL HIGH (ref 0.61–1.12)

## 2024-03-03 LAB — I-STAT CG4 LACTIC ACID, ED: Lactic Acid, Venous: 1.4 mmol/L (ref 0.5–1.9)

## 2024-03-03 LAB — MAGNESIUM: Magnesium: 2 mg/dL (ref 1.7–2.4)

## 2024-03-03 LAB — BRAIN NATRIURETIC PEPTIDE: B Natriuretic Peptide: 18.9 pg/mL (ref 0.0–100.0)

## 2024-03-03 LAB — LIPASE, BLOOD: Lipase: 103 U/L — ABNORMAL HIGH (ref 11–51)

## 2024-03-03 MED ORDER — LORAZEPAM 1 MG PO TABS
1.0000 mg | ORAL_TABLET | ORAL | Status: DC | PRN
  Administered 2024-03-03: 4 mg via ORAL
  Administered 2024-03-04: 2 mg via ORAL
  Filled 2024-03-03: qty 2
  Filled 2024-03-03: qty 4

## 2024-03-03 MED ORDER — SODIUM CHLORIDE 0.9 % IV BOLUS
500.0000 mL | Freq: Once | INTRAVENOUS | Status: AC
  Administered 2024-03-03: 500 mL via INTRAVENOUS

## 2024-03-03 MED ORDER — PROPYLTHIOURACIL 50 MG PO TABS
50.0000 mg | ORAL_TABLET | Freq: Three times a day (TID) | ORAL | Status: DC
  Administered 2024-03-03 – 2024-03-04 (×2): 50 mg via ORAL
  Filled 2024-03-03 (×5): qty 1

## 2024-03-03 MED ORDER — PROPRANOLOL HCL 10 MG PO TABS
10.0000 mg | ORAL_TABLET | Freq: Three times a day (TID) | ORAL | Status: DC
  Administered 2024-03-03 – 2024-03-04 (×3): 10 mg via ORAL
  Filled 2024-03-03 (×4): qty 1

## 2024-03-03 MED ORDER — INFLUENZA VAC SPLIT HIGH-DOSE 0.5 ML IM SUSY
0.5000 mL | PREFILLED_SYRINGE | INTRAMUSCULAR | Status: AC
  Administered 2024-03-05: 0.5 mL via INTRAMUSCULAR
  Filled 2024-03-03: qty 0.5

## 2024-03-03 MED ORDER — SODIUM CHLORIDE 0.9 % IV SOLN: INTRAVENOUS | Status: AC

## 2024-03-03 MED ORDER — COSYNTROPIN 0.25 MG IJ SOLR: 0.2500 mg | Freq: Once | INTRAMUSCULAR | Status: DC

## 2024-03-03 MED ORDER — LORAZEPAM 2 MG/ML IJ SOLN
1.0000 mg | INTRAMUSCULAR | Status: DC | PRN
  Administered 2024-03-04: 3 mg via INTRAVENOUS
  Administered 2024-03-05: 4 mg via INTRAVENOUS
  Filled 2024-03-03 (×3): qty 2

## 2024-03-03 MED ORDER — COSYNTROPIN 0.25 MG IJ SOLR
0.2500 mg | Freq: Once | INTRAMUSCULAR | Status: AC
  Administered 2024-03-03: 0.25 mg via INTRAVENOUS
  Filled 2024-03-03: qty 0.25

## 2024-03-03 MED ORDER — COSYNTROPIN 0.25 MG IJ SOLR
0.2500 mg | Freq: Once | INTRAMUSCULAR | Status: DC
  Filled 2024-03-03: qty 0.25

## 2024-03-03 MED ORDER — DEXAMETHASONE SODIUM PHOSPHATE 10 MG/ML IJ SOLN
8.0000 mg | Freq: Three times a day (TID) | INTRAMUSCULAR | Status: DC
Start: 2024-03-03 — End: 2024-03-04
  Administered 2024-03-03 (×2): 8 mg via INTRAVENOUS
  Filled 2024-03-03: qty 1
  Filled 2024-03-03: qty 0.8
  Filled 2024-03-03: qty 1
  Filled 2024-03-03 (×2): qty 0.8

## 2024-03-03 MED ORDER — POTASSIUM CHLORIDE CRYS ER 20 MEQ PO TBCR
40.0000 meq | EXTENDED_RELEASE_TABLET | Freq: Once | ORAL | Status: DC
  Filled 2024-03-03: qty 2

## 2024-03-03 MED ORDER — LORAZEPAM 2 MG/ML IJ SOLN
1.0000 mg | INTRAMUSCULAR | Status: AC
Start: 2024-03-03 — End: 2024-03-03
  Administered 2024-03-03: 1 mg via INTRAVENOUS
  Filled 2024-03-03: qty 1

## 2024-03-03 NOTE — Progress Notes (Signed)
 Just re messaged pharmacy aout Propylthiouracil    that has still not been given

## 2024-03-03 NOTE — ED Notes (Signed)
 Cosyntropin  med at nursing station

## 2024-03-03 NOTE — TOC Initial Note (Signed)
 Transition of Care [facility]) - Initial/Assessment Note    Patient Details  Name: Casey Warren MRN: [id number] Date of Birth: 14-Apr-1961  [facility] CM/SW Contact:    Lendia Dais, LCSWA Phone Number: 03/03/2024, 4:06 PM  Clinical Narrative: CSW attempted to speak to patient at bedside, pt was sleeping and would not wake up.  CSW called the daughter Arlinda Madonna) and completed assessment.  Pt is from home with wife and doesn't drive. Arlinda and her sister transport the patient where he needs to go. Pt does not use DME and the daughter requested HCPOA info. Order was placed.   Pt has seen their pcp in the last year, takes meds as prescribed and receives retirement as their income and is able to afford basic needs.   Pt currently deals with anxiety. CSW asked if resources could be provided daughter was agreeable and mentioned that the patient may be hard to convince. Pt has a hx of illict drug use but has not used drugs within the last 10 years. Arlinda stated that the patient currently uses alcohol. Last use was 02/26/2024 and the patient was drinking liquor every day.  CSW will continue to follow.   Expected Discharge Plan: Skilled Nursing Facility Barriers to Discharge: Continued Medical Work up, SNF Pending bed offer   Patient Goals and CMS Choice Patient states their goals for this hospitalization and ongoing recovery are:: Unable to assess   Choice offered to / list presented to : NA      Expected Discharge Plan and Services In-house Referral: Clinical Social Work     Living arrangements for the past 2 months: Single Family Home                                      Prior Living Arrangements/Services Living arrangements for the past 2 months: Single Family Home Lives with:: Spouse Patient language and need for interpreter reviewed:: Yes        Need for Family Participation in Patient Care: Yes (Comment) Care giver support system in place?:  Yes (comment)   Criminal Activity/Legal Involvement Pertinent to Current Situation/Hospitalization: No - Comment as needed  Activities of Daily Living      Permission Sought/Granted   Permission granted to share information with : No  Share Information with NAME: Arlinda Second     Permission granted to share info w Relationship: Daughter  Permission granted to share info w Contact Information: [phone]  Emotional Assessment Appearance:: Appears older than stated age Attitude/Demeanor/Rapport: Unable to Assess Affect (typically observed): Unable to Assess Orientation: : Oriented to Place, Oriented to Self, Oriented to Situation Alcohol / Substance Use: Alcohol Use, Illicit Drugs Psych Involvement: No (comment)  Admission diagnosis:  AKI (acute kidney injury) [N17.9] Hypotension [I95.9] Hypotension, unspecified hypotension type [I95.9] Patient Active Problem List   Diagnosis Date Noted   Hypotension 03/02/2024   Low back pain 05/20/2023   Angioedema 07/01/2022   Anxiety 07/01/2022   Hyponatremia 11/30/2019   AKI (acute kidney injury) 11/30/2019   Hypotension due to medication 11/30/2019   Seizures (HCC) 11/30/2019   Abnormal EKG 04/06/2017   Essential hypertension 04/06/2017   ANEMIA 04/28/2008   Alcohol abuse 04/28/2008   TOBACCO ABUSE 04/28/2008   SUBSTANCE ABUSE, MULTIPLE 04/28/2008   Dental caries 04/28/2008   GERD 04/28/2008   PEPTIC ULCER DISEASE, HELICOBACTER PYLORI POSITIVE 04/28/2008   PCP:  Celestia Rosaline SQUIBB,  NP Pharmacy:   [id number] [address], [place] - [address] [place] [address] Phone: [phone] Fax: [phone]  [facility] - [place], [place] - [address] [place] [address] Phone: [phone] Fax: [phone]     Social Drivers of Health (SDOH) Social History: SDOH Screenings   Depression [id number]): Low Risk  (07/29/2023)   Tobacco Use: High Risk (03/02/2024)   SDOH Interventions:     Readmission Risk Interventions     No data to display

## 2024-03-03 NOTE — Progress Notes (Signed)
 Pt transferred to [facility] [address] to a higher level of care. Meds not given have not arrived from pharmacy. Pt will open eyes for a few minutes, took Meds with applesauce. Dr. CHRISTOBAL has been to see pt and saw more alert now. Pt will still not answer questions for me. Family at bedside and aware of transfer.

## 2024-03-03 NOTE — Discharge Instructions (Signed)
 In a time of Crisis: [facility]  Mobile Crisis Management provides immediate crisis response, 24/7.  Call [phone]  [facility] for MH/DD/SA [facility] is available 24 hours a day, 7 days a week. Customer Service Specialists will assist you to find a crisis provider that is well-matched with your needs. Your local number is: [phone]  [facility] (BHUC) IOP, individual counseling, medication management [address] [place], [place] [address] [phone] Call for intake hours; Medicaid and Uninsured    Substance Use Outpatient Providers  [facility] ([facility]) Group and individual counseling. [address]  [place], [place] [address] [phone] [place]: [phone]  [place]: [phone] Medicaid and uninsured.   [facility] Offers IOP groups multiple times per week. [address] [place], [place] [address] [phone] Takes Medicaid and other insurances.   [name]  Chemical Dependency Intensive Outpatient Program (IOP) [address] [place], [place] [address] [phone] Takes Nurse, learning disability and [place].   [facility]  IOP and Partial Hospitalization Program  [address]  [place], [place] [address] [phone] Private Insurance, [place] only for partial hospitalization  [facility] [address], [address], [place], [place] [address] [phone] Monday-Friday. Short and Long term options.  [facility] (BHUC) IOP, individual counseling, medication management [address] [place], [place] [address] [phone] Medicaid and [facility]  [facility] [address]  [place], [place] [address] [phone] Private Insurance and Self Pay   [facility] [address]  [place], [place] [address] [phone] Private Insurance, [place], and Self Pay   [facility]one [facility]  [address] [place], [place] [address] [facility]  [address]  [place], [place] [address] [phone]  [facility]  [address] [place], [place] [address] [phone]      Residential Treatment Programs  [facility] ([facility] [address] [place], [place] [address] [phone] or [phone] Detox and Residential Rehab 21 days (Medicaid, private insurance, and self pay. If Medicare, will look into funding). No methadone. Call for pre-screen.   [facility] Services) [address]  [place], [place] [address] [phone] Detox 3-7 days (self Pay and Medicaid Limited availability). Transitional Program for females needs 60 days clean first.  Rehab Only for Males (Medicare, Medicaid, and Self Pay)-No methadone.  [facility] [address] [place], [place] [address] [phone] or [phone] Private Insurance only  [facility] PHONE: [phone] FAX: [phone] Residential program for women 21 and over for up to a year through a Christian 12-step recovery model. Self-pay.    [facility] [address] [place], [place] [address] Phone:  [phone] Must be detoxed 72 hours prior to admission; 28 day program.  Self-pay.  [facility] [address]  [place], [place] [phone] [employer], Medicare, [place] (not straight [place]). They offer assistance with transportation.   [facility] [address] [place],  [place], [place] [address] [phone] Christian Based Program. Men only. No insurance  [employer] is a substance use disorder treatment program for women, including those who are pregnant, parenting, and/or whose lives have been touched by abuse and violence. [phone])  [phone]  [facility] [address]  [place], [place] [address] Women's: [phone] Men's: [phone] No Medicaid.   [facility] Locations across the [place] (mainly [place] ) willing to help with transportation.  [phone] [employer]. [facility]  [address] [place]  [place], [place] [address] [phone] Treatment Only, must make assessment appointment, and must be sober for assessment appointment. Self pay, [facility], must be [facility] resident. No methadone.   [facility]  [address] [place], [place] [address] [phone] No pending legal charges, Long-term work program. No methadone. Call for assessment.  [facility]  [address] [place], [place] [address] [phone] or [phone] Commercial Insurance Only  [facility] - [phone] Private Insurance (no [place]). Males/Females, call to make referrals, multiple facilities.   [facility]: [facility] [address] [place], [place] [address] [phone]  [facility]-no methadone: [facility] [address] [place], [place] [address] [phone] [facility] [place] [phone] [place], [place] For women, houses 8 residents for sober living. No Medicaid.         AA Meetings Website to locate meetings (virtually or in person): https://www.young.biz/ Phone: [phone]  Syringe Services Program: Due to COVID-19, syringe services programs are likely operating under different hours with limited or no fixed site hours. Some programs may not be operating at all. Please contact the program directly using the phone numbers provided below to see if they are still operating under COVID-19. [facility] Solution to the Opioid Problem (GCSTOP) Fixed;  mobile; peer-based;Lance Cummins) [phone] [email or web address] Fixed site exchange at [facility], [address] [place], [place] [address] on Wednesdays (2:00 - 5:00 pm) and Thursdays (4:00 - 8:00 pm). Pop-up mobile exchange locations: [employer], [address] [place], [place] [address] on Tuesdays (11:00 am - 1:00 pm) and Fridays (11:00 am - 1:00 pm) -[facility] - [address], [place], [place] [address] on Tuesdays (2:00 - 4:00 pm) and Fridays (2:00 - 4:00 pm) -[facility] [employer] [employer] copy - also serves Radio broadcast assistant and [employer] [place] [employer];Fixed; mobile; peer-based; Velia Specking [phone] [email or web address] [address] [place], [place] [address] Delivery and outreach available in [place] and [place], please call for more information. Monday, Tuesday: 1:00 -7:00 pm, Thursday: 4:00 pm - 8:00 pm, Friday: 1:00 pm - 8:00 pm)  Medication-Assisted Treatment (MAT):  -[place]- services [address] and surrounding areas including [place], [place], [place], [place], [address], [place], [place], [place], [place], and [place], [place]. Options include Methadone, buprenorphine or Suboxone. [address], [place], [place] [address] Phone: [phone] Mon - Fri: 5:30am - 2:00pm Sat: 5:30am -7:30am Sun: Closed Holidays: 6:00am - 8:00am  -[facility] [place] [facility]- We use FDA-approved medications, like methadone/suboxone/sublocade, and vivitrol. These medications are then combined with customized care plans that include individual or group counseling, toxicology, and medical care directed by on-site physicians. Accepts most insurance plans, Medicaid, and private pay.  [address] [place], [place] [address] Phone: [phone] Monday-Friday 5:00 AM - 10:00 AM Saturday 6:00 AM - 8:30 AM Sunday 6:00 AM - 7:00 AM  -[facility]- [facility] is a treatment & recovery focused  program. In addition to receiving methadone medication, our clients participate in individual and group counseling as well as random drug testing. If accepted into the [facility] Opioid Program, you will be provided several intake appointments and a physical exam  [address], [place] [address] Office: [phone]  Fax: [phone]  -[facility]- We put our community members at the center of everything we do, for remote treatment services as well as in-person, from alcohol withdrawal to opioid use and more.  [address], [place], [place] [address] [phone] Monday-Wednesday: 9:00am - 5:00pm Thursday: 9:00am - 6:00pm Friday: 9:00am - 5:00pm Saturday: 9:00am - 1:00pm Sunday: Closed  -[facility] (Or [place]) [address], [place], [place] [address] [phone]  [place] [phone] [address] [place], [place] [address]  M-W    5:00am-12:00pm Thu     5:00am-10:00am Fri       5:00am-12:00pm Sat      5:00am-8:00am Sun     Closed  $12/daily for Methadone Treatment.    Counseling Resources:  [facility] [address]  [place], [place] [address]        [phone]     *[facility]  [address] [place]  [place], [place] [address] [phone]  [facility] Psychological Services [address] [place], [place]  [phone]    Janit Griffins [facility] [address], [place], [place] [phone]  [facility]:  [phone] [address] [place]  [facility]:  [address] [place], [place] [phone]  *[facility] (under & uninsured) [address]   [place] [phone]    [email or web address]    [facility] [place] -[address]     Phone:  [phone] [place]-  [address]  [phone]    [facility]  [address] [place], [place] [phone]  *[facility]: [address] [place], [place], [place]   [phone]   [facility] (Spanish therapist) [address] [place]  [address] [place] [address] [phone]    [facility]  [phone]  [address],  [place], [place]    [place] [phone] [address] [place]   [facility]  [address] [place], [place]        [phone]  [facility]/Walk In [name], [address], [place] [phone]):   Mon - Fri from 8 AM - 3 PM  [facility] the [address], [address] , [place]: [phone]) 8:30 - 12; 1 - 2:30 Accepts Medicaid   [facility] [employer], [address] [place], [place] [phone]):8:30 - 12; 2 - 3PM  [facility] [employer], [address], [place]; [phone]):   Mon - Fri 8 AM - 5 PM  *[facility] [address] [place]  MWF 12:30 to 3:00 or  call to schedule an appointment [phone]  Specific Provider options Psychology Today  https://www.psychologytoday.com/us  click on find a therapist  enter your zip code left side and select or tailor a therapist for your specific need.   [facility] Provider Directory [email or web address]  (Medicaid)  Follow all drop down to find a provider  Social Support program [facility] [place] [phone] or [employer] [address], [place], [place] Recovery support and educational  In home counseling [facility] Telephone: [phone]  office in [place] [email or web address]   private insurance [place], [place] health Choice, [place], [place], [place], [place], [place] Health Choice   24- Hour Availability:  [employer] [facility]  [phone] or [phone]  [facility] of the [facility]  [phone]  [facility] Crisis Service  [phone]   [place]  [phone] (after hours)  [facility]/Mobile Crisis   [phone]  [place]  National Suicide Hotline  724-876-5428 [place])  Call 911 or go to emergency room  [facility]  [phone]);  [place] and [employer]  [phone]); [place], [place], [place], [place], [place], [place], [place]

## 2024-03-03 NOTE — [place] FL2 (Signed)
 [place]  MEDICAID FL2 LEVEL OF CARE FORM     IDENTIFICATION  Patient Name: Casey Warren Birthdate: April 16, 1961 Sex: male Admission Date (Current Location): 03/02/2024  [facility] and [place] Number:  Producer, television/film/video and Address:  [facility] [place], [address], [place], [place] [address]      Provider Number: [id number]  Attending Physician Name and Address:  Christobal Guadalajara, MD  Relative Name and Phone Number:  Blair Kettle (Daughter)  [phone]    Current Level of Care: Hospital Recommended Level of Care: Skilled Nursing Facility Prior Approval Number:    Date Approved/Denied:   PASRR Number: Pending  Discharge Plan: SNF    Current Diagnoses: Patient Active Problem List   Diagnosis Date Noted   Hypotension 03/02/2024   Low back pain 05/20/2023   Angioedema 07/01/2022   Anxiety 07/01/2022   Hyponatremia 11/30/2019   AKI (acute kidney injury) 11/30/2019   Hypotension due to medication 11/30/2019   Seizures (HCC) 11/30/2019   Abnormal EKG 04/06/2017   Essential hypertension 04/06/2017   ANEMIA 04/28/2008   Alcohol abuse 04/28/2008   TOBACCO ABUSE 04/28/2008   SUBSTANCE ABUSE, MULTIPLE 04/28/2008   Dental caries 04/28/2008   GERD 04/28/2008   PEPTIC ULCER DISEASE, HELICOBACTER PYLORI POSITIVE 04/28/2008    Orientation RESPIRATION BLADDER Height & Weight     Self, Situation, Place  Normal Continent Weight: 149 lb 14.6 oz (68 kg) Height:  5' 4 (162.6 cm)  BEHAVIORAL SYMPTOMS/MOOD NEUROLOGICAL BOWEL NUTRITION STATUS    Convulsions/Seizures Continent Diet  AMBULATORY STATUS COMMUNICATION OF NEEDS Skin   Limited Assist Verbally Normal                       Personal Care Assistance Level of Assistance  Bathing, Feeding, Dressing Bathing Assistance: Maximum assistance Feeding assistance: Limited assistance Dressing Assistance: Maximum assistance     Functional Limitations Info  Sight, Speech, Hearing Sight Info:  Adequate Hearing Info: Adequate Speech Info: Adequate    SPECIAL CARE FACTORS FREQUENCY  PT (By licensed PT), OT (By licensed OT)     PT Frequency: 5x a week OT Frequency: 5x a week            Contractures Contractures Info: Not present    Additional Factors Info  Code Status, Allergies Code Status Info: Full Allergies Info: Citrus  Fruit Extracts  Chlorthalidone            Current Medications (03/03/2024):  This is the current hospital active medication list Current Facility-Administered Medications  Medication Dose Route Frequency Provider Last Rate Last Admin   0.9 %  sodium chloride  infusion   Intravenous Continuous Kc, Ramesh, MD       acetaminophen  (TYLENOL ) tablet 650 mg  650 mg Oral Q6H PRN Shona Laurence N, DO       albuterol  (PROVENTIL ) (2.5 MG/3ML) 0.083% nebulizer solution 2.5 mg  2.5 mg Inhalation Q6H PRN Shona Laurence SAILOR, DO       aspirin  EC tablet 81 mg  81 mg Oral Daily Shona Laurence N, DO   81 mg at 03/03/24 1130   dexamethasone  (DECADRON ) injection 8 mg  8 mg Intravenous TID Christobal Guadalajara, MD       escitalopram  (LEXAPRO ) tablet 20 mg  20 mg Oral Daily Hall, Carole N, DO       folic acid  (FOLVITE ) tablet 1 mg  1 mg Oral Daily Hall, Carole N, DO   1 mg at 03/03/24 1130   heparin  injection 5,000  Units  5,000 Units Subcutaneous Q8H Shona Laurence N, DO   5,000 Units at 03/03/24 9379   LORazepam  (ATIVAN ) tablet 1-4 mg  1-4 mg Oral Q1H PRN Christobal Guadalajara, MD   4 mg at 03/03/24 1129   Or   LORazepam  (ATIVAN ) injection 1-4 mg  1-4 mg Intravenous Q1H PRN Christobal Guadalajara, MD       multivitamin with minerals tablet 1 tablet  1 tablet Oral Daily Shona Laurence N, DO   1 tablet at 03/03/24 1130   polyethylene glycol (MIRALAX  / GLYCOLAX ) packet 17 g  17 g Oral Daily PRN Hall, Carole N, DO       potassium chloride  SA (KLOR-CON  M) CR tablet 40 mEq  40 mEq Oral Once Kc, Guadalajara, MD       prochlorperazine  (COMPAZINE ) injection 5 mg  5 mg Intravenous Q6H PRN Shona Laurence N, DO       propranolol   (INDERAL ) tablet 10 mg  10 mg Oral TID Kc, Guadalajara, MD       propylthiouracil  (PTU) tablet 50 mg  50 mg Oral Q8H Kc, Ramesh, MD       thiamine  (VITAMIN B1) tablet 100 mg  100 mg Oral Daily Shona Laurence N, DO   100 mg at 03/03/24 1130   topiramate  (TOPAMAX ) tablet 100 mg  100 mg Oral BID Shona Laurence N, DO   100 mg at 03/03/24 1129     Discharge Medications: Please see discharge summary for a list of discharge medications.  Relevant Imaging Results:  Relevant Lab Results:   Additional Information [id number]  [place], LCSWA

## 2024-03-03 NOTE — Consult Note (Signed)
 NAME:  Casey Warren, MRN:  [id number], DOB:  January 20, 1961, LOS: 1 ADMISSION DATE:  03/02/2024, CONSULTATION DATE:  03/03/24 REFERRING MD:  Dr. Christobal, CHIEF COMPLAINT:  AMS   History of Present Illness:  HPI obtained from EMR as pt remains encephalopathic.   30 yoM with PMH of tobacco abuse, ETOH abuse, HTN, HLD, and GERD who presented to ER 9/23 after being sent from PCP for hypotension.  No family at bedside presently but initially reported poor PO intake for several days and low BP for ~2 weeks and that he stopped drinking ETOH at the end of August vs 1 week ago.  Unclear when last too hypertensive medications.  Reported decline in memory for years.  Denied recent N/V/D, or fevers.  Initial SBP in 70's on arrival with normal lactic which resolved after 2L NS and remains confused, oriented 1-2.  Has been afebrile, remains tachycardic 110, and normoxia.  AKI noted BUN/ sCr 86/ 2.63, K 3.3, normal WBC, Hgb 11, Na 139, normal glucose, trop neg, UA neg, ETOH neg.  CTH neg for acute abnormalities.  CXR neg.  Admitted to [facility] for hypotension and AKI.  Further workup noted for cortisol 2.4, TSH <0.1, FT4 3.25.  Pt had worsening confusion 9/24 hallucinating, voided in the floor and pulled out his IV's.  Ativan  given per CIWA of 27.  Pt then more calm and cooperative, able to ambulate with PT and IV restarted.  PCCM consulted for possible ICU admit given concern of ETOH withdrawal vs thyroid  toxicity.  Pertinent  Medical History  HTN, HLD, anxiety/ depression, ETOH abuse, tobacco abuse, GERD, DMT2  Significant Hospital Events: Including procedures, antibiotic start and stop dates in addition to other pertinent events   9/23 admit [facility]  Interim History / Subjective:  Pt currently oriented to self, denies any pain or SOB.  Follows simple commands and nods yes to recent ETOH use.   Objective    Blood pressure 107/78, pulse 99, temperature 97.9 F (36.6 C), temperature source Axillary, resp. rate 19, height 5'  4 (1.626 m), weight 68 kg, SpO2 96%.        Intake/Output Summary (Last 24 hours) at 03/03/2024 1307 Last data filed at 03/03/2024 0100 Gross per 24 hour  Intake --  Output 600 ml  Net -600 ml   Filed Weights   03/02/24 2059  Weight: 68 kg   Examination: General:  chronically ill older male lying on ER stretcher in NAD HEENT: MM pink/dry, normal JVD, pupils 4/r, anicteric Neuro: Alert to self.  Calm, follows commands, MAE- no obvious focal abnormality, speech is slurred- likely due to dry mm and missing dentition  CV: rr, ST 110, no murmur, +2 pulses PULM:  non labored, minimally tachypneic at times, clear and diminished, RA > 97% GI: soft, bs+, NT Extremities: warm/dry, no LE edema  Skin: no rashes   Resolved problem list   Assessment and Plan   Metabolic encephalopathy +/- ETOH withdrawal - reported memory decline for years, unclear baseline.  CTH neg.  Unclear last ETOH, level neg on admit. No obvious infectious etiology, no fever, leukocytosis.  Hypotension, resolved after IVF's, normal lactic, normal trop, CXR neg.  Hx of HTN, unclear when last took antihypertensives  AKI Hypokalemia Multiple endocrine abnormalities with undetectable TSH and elevated FT4, low cortisol P: - No ICU needs at this time, ok for [facility] to admit to PCU - currently calm and protecting airway. remains slightly tachycardic but otherwise MAPs remains > 65.   -  some concern for thyroid  storm, criteria scoring 25 with his tachycardia and unclear baseline encephalopathy are confounding.  A score of  > 44 being c/w thyroid  storm, but pt has multiple confounding medical issues which makes it difficult to say for certainty this is early thyroid  storm vs ETOH withdrawal, vs r/o other etiologies.  The absence of fever, jaundice, no reports of N/V/D, and no obvious signs of HF are reassuring. Thyroid  US  pending.  Pending cosyntropin  test.  Ideally, endo input would be helpful.  - cont MIVF and trend renal indices   - replete electrolytes prn - cont CIWA q4hrs with ativan , with MVI, thiamine , folate - trend neuro exam/ delirium precautions   Remainder per primary team.  Nothing further to add.  PCCM will sign off.  Please call us  back if we can be of any further assistance.   Labs   CBC: Recent Labs  Lab 03/02/24 1634 03/02/24 2108 03/03/24 0451  WBC 6.2 6.0 5.3  NEUTROABS 4.5  --   --   HGB 14.2 13.1 11.1*  HCT 41.5 39.8 32.6*  MCV 88.3 91.5 88.6  PLT 276 256 211    Basic Metabolic Panel: Recent Labs  Lab 03/02/24 1634 03/02/24 2108 03/03/24 0451  NA 138  --  139  K 4.5  --  3.3*  CL 100  --  110  CO2 19*  --  19*  GLUCOSE 139*  --  114*  BUN 105*  --  86*  CREATININE 4.14* 3.80* 2.63*  CALCIUM  9.7  --  8.5*  MG  --   --  2.0  PHOS  --   --  5.0*   GFR: Estimated Creatinine Clearance: 23.4 mL/min (A) (by C-G formula based on SCr of 2.63 mg/dL (H)). Recent Labs  Lab 03/02/24 1634 03/02/24 1638 03/02/24 1657 03/02/24 2108 03/03/24 0133 03/03/24 0451  WBC 6.2  --   --  6.0  --  5.3  LATICACIDVEN  --  1.6 1.1  --  1.4  --     Liver Function Tests: Recent Labs  Lab 03/02/24 1634  AST 39  ALT 56*  ALKPHOS 93  BILITOT 1.6*  PROT 7.1  ALBUMIN 3.4*   Recent Labs  Lab 03/03/24 0053  LIPASE 103*   No results for input(s): AMMONIA in the last 168 hours.  ABG No results found for: PHART, PCO2ART, PO2ART, HCO3, TCO2, ACIDBASEDEF, O2SAT   Coagulation Profile: No results for input(s): INR, PROTIME in the last 168 hours.  Cardiac Enzymes: No results for input(s): CKTOTAL, CKMB, CKMBINDEX, TROPONINI in the last 168 hours.  HbA1C: Hemoglobin A1C  Date/Time Value Ref Range Status  08/22/2021 11:01 AM 5.7 (A) 4.0 - 5.6 % Final   HbA1c, POC (prediabetic range)  Date/Time Value Ref Range Status  10/23/2022 10:40 AM 5.3 (A) 5.7 - 6.4 % Final  02/22/2021 09:30 AM 5.7 5.7 - 6.4 % Final    CBG: Recent Labs  Lab 03/02/24 1602   GLUCAP 155*    Review of Systems:   unable  Past Medical History:  He,  has a past medical history of Angio-edema, Anxiety, Asthma, GERD (gastroesophageal reflux disease), History of blood transfusion, HTN (hypertension), Seizures (HCC), and Ulcer.   Surgical History:   Past Surgical History:  Procedure Laterality Date   CLOSED REDUCTION NASAL FRACTURE  11/19/2011   Procedure: CLOSED REDUCTION NASAL FRACTURE;  Surgeon: Marlyce Finer, MD;  Location: [facility] OR;  Service: ENT;  Laterality: N/A;  CLOSED REDUCTION NASAL AND NASAL  SEPTAL FRACTURE WITH STABILIZATION   No surgical       Social History:   reports that he has been smoking cigars and cigarettes. He has never been exposed to tobacco smoke. He has never used smokeless tobacco. He reports that he does not currently use alcohol. He reports that he does not use drugs.   Family History:  His family history includes Allergic rhinitis in his sister; Asthma in his grandson; Breast cancer in his mother; Diabetes in his mother; Hypertension in his mother; Leukemia in his father; Stroke in his mother. There is no history of Colon cancer.   Allergies Allergies  Allergen Reactions   Citrus Swelling   Fruit Extracts Swelling    Pt experiences swelling with most fruits   Chlorthalidone  Other (See Comments)    Hyponatremia     Home Medications  Prior to Admission medications   Medication Sig Start Date End Date Taking? Authorizing Provider  albuterol  (VENTOLIN  HFA) 108 (90 Base) MCG/ACT inhaler Inhale 2 puffs into the lungs every 6 (six) hours as needed for wheezing or shortness of breath. 02/13/23  Yes Celestia Rosaline SQUIBB, NP  amLODipine  (NORVASC ) 10 MG tablet Take 1 tablet (10 mg total) by mouth daily. 07/19/22  Yes Celestia Rosaline SQUIBB, NP  aspirin  EC 81 MG tablet Take 81 mg by mouth daily.   Yes [provider]  atorvastatin  (LIPITOR) 20 MG tablet TAKE 1 TABLET BY MOUTH EVERY DAY 12/20/23  Yes Celestia Rosaline SQUIBB, NP   cyclobenzaprine  (FLEXERIL ) 10 MG tablet Take 1 tablet (10 mg total) by mouth 3 (three) times daily as needed for muscle spasms. 02/16/24  Yes Camara, Amadou, MD  escitalopram  (LEXAPRO ) 20 MG tablet Take 1 tablet (20 mg total) by mouth daily. 07/29/23  Yes Celestia Rosaline SQUIBB, NP  hydrochlorothiazide  (HYDRODIURIL ) 12.5 MG tablet Take 12.5 mg by mouth daily.   Yes [provider]  loratadine  (CLARITIN ) 10 MG tablet TAKE 1 TABLET BY MOUTH EVERY DAY AS NEEDED FOR ALLERGY 09/22/23  Yes Celestia Rosaline SQUIBB, NP  topiramate  (TOPAMAX ) 100 MG tablet Take 100 mg by mouth 2 (two) times daily. 02/06/24  Yes [provider]  valsartan  (DIOVAN ) 40 MG tablet Take 1 tablet (40 mg total) by mouth daily. 07/29/23  Yes Celestia Rosaline SQUIBB, NP  EPINEPHrine  0.3 mg/0.3 mL IJ SOAJ injection Inject 0.3 mg into the muscle as needed for anaphylaxis. 08/10/23   Celestia Rosaline SQUIBB, NP     Critical care time: n/a   CRITICAL CARE Performed by: Lyle Antonetta Lyle Antonetta, NP [facility] 03/03/2024, 1:08 PM  See Amion for pager If no response to pager , please call [phone] until 7pm After 7:00 pm call Elink  [phone]

## 2024-03-03 NOTE — Hospital Course (Addendum)
 Casey Warren is a 63 y.o. male with PMH of  hypertension, hyperlipidemia, chronic anxiety/depression, alcoholism, who presents to the ER, referred by his PCP due to low blood pressures of unclear etiology. Per family members at bedside he has cut down on alcohol consumption.  Last alcoholic drink was about a week ago. In the ER, hypotensive with SBP's in the 70's.  Creatinine elevated 4.14 above baseline.  Alert and oriented x 2.  Received 2L IVF with improvement of his BP.   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  No acute intracranial abnormality.He has had a decline in memory for years.  Assessment and plan: Acute encephalopathy/agitation/delirium Alcohol abuse, w/ withdrawals Recent cognitive decline last Etoh with wine bottle on 02/26/24 per daughter,normally heavy drinker-he had weaned himself from beer for a month. Psych consulted. Treated with CIWA protocol and high-dose thiamine . MRI brain shows acute infarct in right basal ganglia, left corona radiata as well as medial temporal lobe and left. PT OT recommending CIR, insurance denied CIR.  Awaiting SNF now.  Hyperthyroidism Possible hypothyroid crisis Relative adrenal insufficiency w/ low cortisol: TSH undetectable T39.8 Free T4 3.25. TSI 5.08.  Elevated. Thyrotropin receptor antibody 8.45 elevated. A.m. cortisol at 5 AM was low, but Cosyntropin  test reassuring  Prior provider, discussed with outpatient [facility] Dr Mercie US  thryoid moderately heterogenous thyroid  gland- likely thyroiditis. Received PTU initially with and now on methimazole . Continue atenolol .  Also on Decadron  last dose of therapy 10/2.  Acute kidney injury Metabolic acidosis Hypokalemia Hypophosphatemia. Hypomagnesemia. Hypernatremia. AKI in the setting of hypotension/poor oral intake.  Creatinine improved with IV  hydration. Electrolyte replaced.  Hypotension History of hypertension-PTA on amlodipine /valsartan /HCTZ: Blood pressure now stable.  Continue atenolol .  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 Neurology was consulted. Currently on aspirin  and Plavix for 3 weeks followed by Plavix alone. Will require 30-day heart monitor after discharge. CIR following.   Chronic anxiety/depression Psychiatry was consulted.  Now signed off. Currently on Lexapro  and Haldol  as needed for agitation.  Lung nodules: 3 mm lingular, 5 mm right upper lobe Follow-up in 12 month w/ CT chest  Severe protein-calorie malnutrition. Refeeding syndrome. Continue supplementation. Had a Cortrak which is now removed.  LFT elevation. Improving after bolus. Bilirubin normal. Suspect chronic liver injury from alcohol use.  HLD. LDL 78. Was on statin. LFTs are elevated therefore not a candidate for high intensity statin. Currently holding statin.  Tension headache. Continue Topamax .  Steroid-induced leukocytosis. No evidence of clinical infection.  Monitor.  Non-anion gap metabolic acidosis. Likely from hyperchloremia.  As well as diarrhea. Patient remains asymptomatic.  Possible pneumonia. Treated with ceftriaxone azithromycin for 5 days. Monitor.  Antibiotic induced diarrhea. Antibiotic is now stopped.

## 2024-03-03 NOTE — ED Notes (Signed)
 Called pt wife per request, no answer

## 2024-03-03 NOTE — Evaluation (Signed)
 Physical Therapy Evaluation Patient Details Name: Casey Warren MRN: [id number] DOB: 08/04/1960 Today's Date: 03/03/2024  History of Present Illness  Pt is 63 yo with hypotension referred from PCP. 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  PMH significant of seizures, PUD, GERD, hypertension, anemia, alcohol use, anxiety.  Clinical Impression  Pt is currently CGA for bed mobility for safety, Min A for sit to stand, transfers and gait without an AD. Pt is a poor historian and unsure of assistance available at discharge. Currently pt is a very high risk for falls. Due to pt current functional status, home set up and available assistance at home recommending skilled physical therapy services < 3 hours/day in order to address strength, balance and functional mobility to decrease risk for falls, injury, immobility, skin break down and re-hospitalization.          If plan is discharge home, recommend the following: Assistance with cooking/housework;Assist for transportation;Help with stairs or ramp for entrance;Supervision due to cognitive status   Can travel by private vehicle   Yes    Equipment Recommendations Rolling walker (2 wheels)     Functional Status Assessment Patient has had a recent decline in their functional status and demonstrates the ability to make significant improvements in function in a reasonable and predictable amount of time.     Precautions / Restrictions Precautions Precautions: Fall Recall of Precautions/Restrictions: Impaired Restrictions Weight Bearing Restrictions Per Provider Order: No      Mobility  Bed Mobility Overal bed mobility: Needs Assistance Bed Mobility: Supine to Sit, Sit to Supine     Supine to sit: Contact guard Sit to supine: Contact guard assist   General bed mobility comments: CGA for bed mobility     Transfers Overall transfer level: Needs assistance Equipment used: 1 person hand held assist Transfers: Sit to/from Stand Sit to Stand: Min assist, From elevated surface           General transfer comment: Min A for balance and initial mometum to get to standing from sitting fro elevated stretcher. Performed 2x during session.    Ambulation/Gait Ambulation/Gait assistance: Min assist Gait Distance (Feet): 300 Feet Assistive device: 1 person hand held assist Gait Pattern/deviations: Step-through pattern, Decreased step length - left, Decreased stance time - right Gait velocity: decreased Gait velocity interpretation: <1.8 ft/sec, indicate of risk for recurrent falls   General Gait Details: mid foot initial contact with partial step through very short step length with poor multi directional balance requiring Min A d     Balance Overall balance assessment: Needs assistance Sitting-balance support: Bilateral upper extremity supported, Feet unsupported Sitting balance-Leahy Scale: Fair     Standing balance support: Single extremity supported, During functional activity Standing balance-Leahy Scale: Poor Standing balance comment: reliant on external support with dynamic activity, pt was able to stand at toilet and urinate without UE support and CGA       Pertinent Vitals/Pain Pain Assessment Pain Assessment: No/denies pain    Home Living Family/patient expects to be discharged to:: Private residence Living Arrangements: Spouse/significant other Available Help at Discharge: Family Type of Home: House Home Access: Stairs to enter Entrance Stairs-Rails: Can reach both Entrance Stairs-Number of Steps: 6   Home Layout: One level Home Equipment: Shower seat Additional Comments: pt states that he lives in an apt with his mom and he cares for her but per previous  chart this was not the case. Information was taken from previous chart but unsure how up to date this information  is currently    Prior Function Prior Level of Function : Patient poor historian/Family not available             Mobility Comments: pt reports he does not use an AD       Extremity/Trunk Assessment   Upper Extremity Assessment Upper Extremity Assessment: Defer to OT evaluation    Lower Extremity Assessment Lower Extremity Assessment: Generalized weakness    Cervical / Trunk Assessment Cervical / Trunk Assessment: Normal  Communication   Communication Communication: No apparent difficulties    Cognition Arousal: Alert Behavior During Therapy: Flat affect, Restless   PT - Cognitive impairments: No family/caregiver present to determine baseline, Orientation, Awareness, Memory, Attention, Safety/Judgement, Problem solving, Sequencing   Orientation impairments: Place, Time, Situation     PT - Cognition Comments: Pt is unclear of why he is here, where he is and what time is going on Following commands: Intact       Cueing Cueing Techniques: Verbal cues, Tactile cues            Assessment/Plan    PT Assessment Patient needs continued PT services  PT Problem List Decreased strength;Decreased balance;Decreased mobility;Decreased activity tolerance;Decreased safety awareness;Decreased cognition       PT Treatment Interventions DME instruction;Functional mobility training;Gait training;Stair training;Therapeutic activities;Balance training;Neuromuscular re-education;Patient/family education;Therapeutic exercise    PT Goals (Current goals can be found in the Care Plan section)  Acute Rehab PT Goals PT Goal Formulation: Patient unable to participate in goal setting Time For Goal Achievement: 03/17/24 Potential to Achieve Goals: Fair    Frequency Min 2X/week        AM-PAC PT 6 Clicks Mobility  Outcome Measure Help needed turning from your back to your side while in a flat bed without using bedrails?: A Little Help needed moving from lying on your back to  sitting on the side of a flat bed without using bedrails?: A Little Help needed moving to and from a bed to a chair (including a wheelchair)?: A Little Help needed standing up from a chair using your arms (e.g., wheelchair or bedside chair)?: A Little Help needed to walk in hospital room?: A Little Help needed climbing 3-5 steps with a railing? : A Lot 6 Click Score: 17    End of Session Equipment Utilized During Treatment: Gait belt Activity Tolerance: Patient tolerated treatment well Patient left: in bed;with call bell/phone within reach;with bed alarm set Nurse Communication: Mobility status PT Visit Diagnosis: Unsteadiness on feet (R26.81);Other abnormalities of gait and mobility (R26.89);Muscle weakness (generalized) (M62.81)    Time: 1135-1150 PT Time Calculation (min) (ACUTE ONLY): 15 min   Charges:   PT Evaluation $PT Eval Low Complexity: 1 Low   PT General Charges $$ ACUTE PT VISIT: 1 Visit        Dorothyann Maier, DPT, CLT  Acute Rehabilitation Services Office: [phone] (Secure chat preferred)   Dorothyann VEAR Maier 03/03/2024, 12:03 PM

## 2024-03-03 NOTE — Progress Notes (Signed)
 PROGRESS NOTE Casey Warren  FMW:[id number] DOB: [birth year] DOA: 03/02/2024 PCP: Celestia Rosaline SQUIBB, NP  Brief Narrative/Hospital Course: Casey Warren is a 63 y.o. male with PMH of  hypertension, hyperlipidemia, chronic anxiety/depression, alcoholism, who presents to the ER, referred by his PCP due to low blood pressures of unclear etiology. Per family members at bedside he has cut down on alcohol consumption.  Last alcoholic drink was about a week ago. In the ER, hypotensive with SBP's in the 70's.  Creatinine elevated 4.14 above baseline.  Alert and oriented x 2.  Received 2L IVF with improvement of his BP.   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  No acute intracranial abnormality.He has had a decline in memory for years.  CT chest abd pelvis:No acute findings in the chest, abdomen or pelvis. Scattered bilateral pulmonary nodules measuring up to 5 mm. Non-contrast chest CT can be considered in 12 months if patient is high-risk.  Subjective: Seen and examined today am and discussed w/ ED RN. BP 90-100s, HR 90-110, on RA. Has been confused agitated On my exam he was alert awake to self knows he is in hospital, unable to tel me date or current president name Labs reviewed creatinine further down to 0.6 mild hypokalemia CBC with mild anemia serum cortisol 2.4, TSH low- added free T4 Nursing reports patient removed his IV line, one to one has been requested. This afternoon I came to see him again, discussed with PCCM team . He is resting/sleeping after a dose of ciwa ativan .  Assessment and plan:  Hypotension, unclear etiology History of hypertension-PTA on amlodipine /valsartan /HCTZ: Question if in the setting multiple antihypertensives, ?poor intake.Blood pressure stabilized following IV fluid resuscitation.  TSH suppressed cortisol low will check free T4, cosyntropin   test.No evidence of infection on UA and chest x-ray, blood culture pending. Continue IV hydration   Hyperthyroidism Low cortisol: Tsh < 0.1, ft4 added on and resulted  high  3.25 ? If patient having thyroid  storm, given his altered mental status, tachycardia Picture appears mixed with etoh abuse/ hyperthyroidism-no hyperpyrexia Cosyntropin  test ordered.starting decadron , PCCM consulted and pateint reevaluated. Start propranolol   po if blood pressure allows,checking TRAb, TSI, thyroid  ultrasound Given patient is seriously ill, this afternoon then I discussed with outpatient [facility] Dr Mercie: Checking FT3:(will be significantly up incase of Grave's disease)- I called lab to run FT3. Lfts asat/alt stable US  thryoid is resutled- moderately heterogenous thyroid  gland- likely thyroiditis. Will start PTU 50 mg tid (lfts normal)( for outpatient can do Methimazole  with endocrine follow up) If fT3 is significantly up will  do PTU 100 MG TID in line  with Grave's diseases. If TRAb TSI neg can stop Antithyroid meds at that point. --  Acute kidney injury Metabolic acidosis Hypo-kalemia Hyperphosphatemia: AKI in the setting of hypotension.  BP stable continue IV fluid hydration avoid nephrotoxic medication monitor urine output replace electrolytes Recent Labs    03/10/23 1652 07/29/23 1336 08/08/23 2042 03/02/24 1634 03/02/24 2108 03/03/24 0451  BUN 6* 6* 6* 105*  --  86*  CREATININE 0.98 0.92 0.85 4.14* 3.80* 2.63*  CO2 21* 24 21* 19*  --  19*  K 4.0 4.3 3.6 4.5  --  3.3*   CVA hx: Incidentally noticed on CT head-old infarcts.  Continue home aspirin  and statin.   Acute encephalopathy/agitation/delirium Alcohol abuse, likely withdrawals Recent cognitive decline: Apparently Quit a week ago-last Etoh with  wine a week ago on 02/26/24 per daughter,he had weaned himself from beer for a month. As per daughter Casey Warren, for 2 and half month fatiued and month and had not eating well  and has lost weight, having memory loss for 3-4 years forgetful/repetitive. He has not been himself recently. More confused/agitated past day or two. Psych consulted. Cont CIWA Ativan  , delirium precaution one to one sitter as needed. FT4 is high- concern for ?thyroid  storm. See above. Likley mixed picture- starting PTU,   Ambulatory dysfunction PT OT assessment Fall precautions.   Chronic anxiety/depression Resume home regimen.  Lung nodules: Fu in 12 m w/ CT chest if high risk  Mobility: PT Orders: Active PT Follow up Rec: Skilled Nursing-Short Term Rehab (<3 Hours/Day)03/03/2024 1202   DVT prophylaxis: heparin  injection 5,000 Units Start: 03/02/24 2200 Code Status:   Code Status: Full Code Family Communication: plan of care discussed with patient at bedside. I called and updated his daughter Casey Warren and she is aware he is critically ill. Patient status is: Remains hospitalized because of severity of illness Level of care: Telemetry Medical   Dispo: The patient is from: home            Anticipated disposition: TBD Objective: Vitals last 24 hrs: Vitals:   03/03/24 1045 03/03/24 1120 03/03/24 1130 03/03/24 1300  BP: 107/78   114/67  Pulse: 99     Resp: 18 19    Temp:   97.9 F (36.6 C)   TempSrc:   Axillary   SpO2: 96%     Weight:      Height:        Physical Examination: General exam: alert awake, oriented x1-2,less agitated currently. HEENT:Oral mucosa moist, Ear/Nose WNL grossly Respiratory system: Bilaterally clear BS,no use of accessory muscle Cardiovascular system: S1 & S2 +, No JVD. Gastrointestinal system: Abdomen soft,NT,ND, BS+ Nervous System: Alert, awake, moving all extremities,and following commands. Extremities: extremities warm, leg edema neg Skin: No rashes,no icterus. MSK: Normal muscle bulk,tone, power.   Medications reviewed:  Scheduled Meds:  aspirin  EC  81 mg Oral Daily   atorvastatin   20 mg Oral Daily   cloNIDine   0.1 mg Oral Once    dexamethasone  (DECADRON ) injection  8 mg Intravenous TID   escitalopram   20 mg Oral Daily   folic acid   1 mg Oral Daily   heparin   5,000 Units Subcutaneous Q8H   multivitamin with minerals  1 tablet Oral Daily   potassium chloride   40 mEq Oral Once   propranolol   10 mg Oral TID   propylthiouracil   50 mg Oral Q8H   thiamine   100 mg Oral Daily   topiramate   100 mg Oral BID   Continuous Infusions:  sodium chloride      sodium chloride      Diet: Diet Order             Diet Heart Room service appropriate? Yes; Fluid consistency: Thin  Diet effective now                    Data Reviewed: I have personally reviewed following labs and imaging studies ( see epic result tab) CBC: Recent Labs  Lab 03/02/24 1634 03/02/24 2108 03/03/24 0451  WBC 6.2 6.0 5.3  NEUTROABS 4.5  --   --   HGB 14.2 13.1 11.1*  HCT 41.5 39.8 32.6*  MCV 88.3 91.5 88.6  PLT 276 256 211   CMP: Recent Labs  Lab 03/02/24 1634 03/02/24 2108 03/03/24 0451  NA 138  --  139  K 4.5  --  3.3*  CL 100  --  110  CO2 19*  --  19*  GLUCOSE 139*  --  114*  BUN 105*  --  86*  CREATININE 4.14* 3.80* 2.63*  CALCIUM  9.7  --  8.5*  MG  --   --  2.0  PHOS  --   --  5.0*   GFR: Estimated Creatinine Clearance: 23.4 mL/min (A) (by C-G formula based on SCr of 2.63 mg/dL (H)). Recent Labs  Lab 03/02/24 1634  AST 39  ALT 56*  ALKPHOS 93  BILITOT 1.6*  PROT 7.1  ALBUMIN 3.4*    Recent Labs  Lab 03/03/24 0053  LIPASE 103*   No results for input(s): AMMONIA in the last 168 hours. Coagulation Profile: No results for input(s): INR, PROTIME in the last 168 hours. Unresulted Labs (From admission, onward)     Start     Ordered   03/04/24 0500  CBC  Daily,   R      03/03/24 1017   03/04/24 0500  Lipase, blood  Tomorrow morning,   R        03/03/24 1425   03/03/24 1543  Blood gas, arterial  ONCE - STAT,   STAT        03/03/24 1542   03/03/24 1500  Basic metabolic panel  Once,   R        03/03/24 1425    03/03/24 1403  T3, free  Add-on,   AD        03/03/24 1402   03/03/24 1155  Thyrotropin receptor autoabs  Once,   R        03/03/24 1154   03/03/24 1155  Thyroid  stimulating immunoglobulin  Once,   R        03/03/24 1154   03/03/24 0500  Basic metabolic panel  Daily,   R      03/02/24 2032   03/02/24 1626  Culture, blood (Routine X 2) w Reflex to ID Panel  BLOOD CULTURE X 2,   R      03/02/24 1626           Antimicrobials/Microbiology: Anti-infectives (From admission, onward)    None         Component Value Date/Time   SDES BLOOD RIGHT HAND 03/02/2024 2108   SPECREQUEST  03/02/2024 2108    BOTTLES DRAWN AEROBIC ONLY Blood Culture adequate volume   CULT  03/02/2024 2108    NO GROWTH < 12 HOURS Performed at [facility], [address] [place], [place] [address]    REPTSTATUS PENDING 03/02/2024 2108    Procedures:    Mennie LAMY, MD [facility] 03/03/2024, 4:11 PM

## 2024-03-03 NOTE — ED Notes (Signed)
 MD aware of score at bedside for assessment

## 2024-03-03 NOTE — ED Notes (Signed)
 Verbal orders from admitting MD for straight cath, 600 ml urine output, pt soiled gown, pt cleaned, changed into new gown

## 2024-03-03 NOTE — Progress Notes (Signed)
 NEW ADMISSION NOTE New Admission Note:   Arrival Method: pt did not respond when moved from ER stretcher to bed Mental Orientation: responds to sternal rub. Does not open eyes or attempt to talk Telemetry: tachy in 120's, box applied Assessment: Completed Skin: dry, intact IV: PIV L arm Pain: sleeping Tubes: none Safety Measures: bed alarm on, door open  Dr. CHRISTOBAL [place] called and aware of pt vitals and responsive to sternal rub. Not talking. Probably d/t meds given earlier. Pt protecting airway. Tele in place. Red MEWS implemented.   Aquilla Voiles A Proctor-Gann, RN

## 2024-03-03 NOTE — Progress Notes (Signed)
 Provider said patient more awake try to give meds

## 2024-03-03 NOTE — ED Notes (Signed)
 Pt activley refusing vitals equipments, awaiting medication verification from pharm

## 2024-03-03 NOTE — Progress Notes (Deleted)
 PROGRESS NOTE Casey Warren  FMW:[id number] DOB: January 01, 1961 DOA: 03/02/2024 PCP: Celestia Rosaline SQUIBB, NP  Brief Narrative/Hospital Course: Casey Warren is a 63 y.o. male with PMH of  hypertension, hyperlipidemia, chronic anxiety/depression, alcoholism, who presents to the ER, referred by his PCP due to low blood pressures of unclear etiology. Per family members at bedside he has cut down on alcohol consumption.  Last alcoholic drink was about a week ago. In the ER, hypotensive with SBP's in the 70's.  Creatinine elevated 4.14 above baseline.  Alert and oriented x 2.  Received 2L IVF with improvement of his BP.   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  No acute intracranial abnormality.He has had a decline in memory for years.   Subjective: Seen and examined today BP 90-100s Has been confused Alert awake to self knows he is in hospital, unable to tel me date or current president name Labs reviewed creatinine further down to 0.6 mild hypokalemia CBC with mild anemia serum cortisol 2.4 Nursing reports patient removed his IV line, one to one has been requested  Assessment and plan:  Hypotension, unclear etiology History of hypertension-PTA on amlodipine /valsartan /HCTZ: Question if in the setting multiple antihypertensives, ?poor intake.Blood pressure stabilized following IV fluid resuscitation.  TSH suppressed cortisol low will check free T4, cosyntropin  test.  No evidence of infection on UA and chest x-ray, blood culture pending. Continue IV hydration   Acute kidney injury Metabolic acidosis Hypo-kalemia Hyperphosphatemia: AKI in the setting of hypotension.  BP stable continue IV fluid hydration avoid nephrotoxic medication monitor urine output replace electrolytes Recent Labs    03/10/23 1652 07/29/23 1336 08/08/23 2042 03/02/24 1634 03/02/24 2108 03/03/24 0451   BUN 6* 6* 6* 105*  --  86*  CREATININE 0.98 0.92 0.85 4.14* 3.80* 2.63*  CO2 21* 24 21* 19*  --  19*  K 4.0 4.3 3.6 4.5  --  3.3*   CVA hx: Incidentally noticed on CT head-old infarcts.  Continue home aspirin  and statin   Acute encephalopathy/agitation/delirium Alcohol abuse, likely withdrawals: Quit a week ago encourage cessation continue multivitamin thiamine  folate . Requesting psychiatry consultation, monitor on CIWA, Ativan  CIWA phototoxic agitation.   Ambulatory dysfunction PT OT assessment Fall precautions.   Chronic anxiety/depression Resume home regimen  Mobility: PT Orders: Active  PT Follow up Rec:    DVT prophylaxis: heparin  injection 5,000 Units Start: 03/02/24 2200 Code Status:   Code Status: Full Code Family Communication: plan of care discussed with patient at bedside. Patient status is: Remains hospitalized because of severity of illness Level of care: Telemetry Medical   Dispo: The patient is from: home            Anticipated disposition: TBD Objective: Vitals last 24 hrs: Vitals:   03/03/24 0830 03/03/24 0900 03/03/24 1120 03/03/24 1130  BP: 102/68 99/77    Pulse: (!) 108     Resp: 20  19   Temp:    97.9 F (36.6 C)  TempSrc:    Axillary  SpO2: 97%     Weight:      Height:        Physical Examination: General exam: alert awake, oriented x1-2, less agitated currently. HEENT:Oral mucosa moist, Ear/Nose WNL grossly Respiratory system: Bilaterally clear BS,no use of accessory muscle Cardiovascular system: S1 & S2 +, No JVD. Gastrointestinal system: Abdomen soft,NT,ND, BS+ Nervous System: Alert, awake, moving all extremities,and following commands. Extremities:  extremities warm, leg edema neg Skin: No rashes,no icterus. MSK: Normal muscle bulk,tone, power   Medications reviewed:  Scheduled Meds:  aspirin  EC  81 mg Oral Daily   atorvastatin   20 mg Oral Daily   cloNIDine   0.1 mg Oral Once   cosyntropin   0.25 mg Intravenous Once    escitalopram   20 mg Oral Daily   folic acid   1 mg Oral Daily   heparin   5,000 Units Subcutaneous Q8H   multivitamin with minerals  1 tablet Oral Daily   potassium chloride   40 mEq Oral Once   thiamine   100 mg Oral Daily   topiramate   100 mg Oral BID   Continuous Infusions:  sodium chloride  Stopped (03/03/24 0949)   Diet: Diet Order             Diet Heart Room service appropriate? Yes; Fluid consistency: Thin  Diet effective now                    Data Reviewed: I have personally reviewed following labs and imaging studies ( see epic result tab) CBC: Recent Labs  Lab 03/02/24 1634 03/02/24 2108 03/03/24 0451  WBC 6.2 6.0 5.3  NEUTROABS 4.5  --   --   HGB 14.2 13.1 11.1*  HCT 41.5 39.8 32.6*  MCV 88.3 91.5 88.6  PLT 276 256 211   CMP: Recent Labs  Lab 03/02/24 1634 03/02/24 2108 03/03/24 0451  NA 138  --  139  K 4.5  --  3.3*  CL 100  --  110  CO2 19*  --  19*  GLUCOSE 139*  --  114*  BUN 105*  --  86*  CREATININE 4.14* 3.80* 2.63*  CALCIUM  9.7  --  8.5*  MG  --   --  2.0  PHOS  --   --  5.0*   GFR: Estimated Creatinine Clearance: 23.4 mL/min (A) (by C-G formula based on SCr of 2.63 mg/dL (H)). Recent Labs  Lab 03/02/24 1634  AST 39  ALT 56*  ALKPHOS 93  BILITOT 1.6*  PROT 7.1  ALBUMIN 3.4*    Recent Labs  Lab 03/03/24 0053  LIPASE 103*   No results for input(s): AMMONIA in the last 168 hours. Coagulation Profile: No results for input(s): INR, PROTIME in the last 168 hours. Unresulted Labs (From admission, onward)     Start     Ordered   03/04/24 0500  CBC  Daily,   R      03/03/24 1017   03/03/24 0737  ACTH  stimulation, 3 time points (baseline, 30 min, 60 min)  (cosyntropin  (CORTROSYN ) test and labs)  Once,   R       Comments: First sample: Draw before administering Cosyntropin  Second sample: Draw 30 minutes after administering Cosyntropin  Third sample: Draw 60 minutes after administering Cosyntropin .Note: Serum cortisol drawn already  9/24 am-this can be baseline-please draw cortisol only after injection os cosyntropin     03/03/24 0737   03/03/24 0500  Basic metabolic panel  Daily,   R      03/02/24 2032   03/02/24 1626  Culture, blood (Routine X 2) w Reflex to ID Panel  BLOOD CULTURE X 2,   R (with STAT occurrences)      03/02/24 1626           Antimicrobials/Microbiology: Anti-infectives (From admission, onward)    None         Component Value Date/Time   SDES BLOOD BLOOD RIGHT HAND 03/02/2024  2108   SPECREQUEST  03/02/2024 2108    BOTTLES DRAWN AEROBIC ONLY Blood Culture adequate volume   CULT  03/02/2024 2108    NO GROWTH < 12 HOURS Performed at [facility], [address] [place], [place] [address]    REPTSTATUS PENDING 03/02/2024 2108    Procedures:  Mennie LAMY, MD [facility] 03/03/2024, 11:39 AM

## 2024-03-03 NOTE — ED Notes (Signed)
 Pt self removed att vitals equipment and pulled out both IVS, pt confused and rambling not following directions. Pt climbed out of bed independently urinated at bedside then assisted self to sit on floor. Nurse assited pt back into bed, changed brief linens, adjusted in bed. Posey alarm placed under pt, fall socks remain in place. Attempt to re orient pt X3. Messaged provider

## 2024-03-03 NOTE — ED Notes (Signed)
 Order placed for sitter due to confusion and attempt to get out of bed, no sitter available per staffing, pt on posey alarm door open NT and pod nurses aware to assist

## 2024-03-03 NOTE — Consult Note (Signed)
 [facility] Psychiatry New Face-to-Face Psychiatric Evaluation   Service Date: March 03, 2024 LOS:  LOS: 1 day    Primary Psych Diagnosis   Alcohol Use Disorder, Severe Delirium   Assessment  Casey Warren is a 63 y.o. male admitted medically for 03/02/2024  3:56 PM for low blood pressures of unclear etiology.  Psychiatrically he carries a diagnosis of anxiety, depression and alcoholism and has a past medical history of hypertension, anemia, GERD and seizures. Psychiatry was consulted for delirium/agitation by Dr. Mennie .   His current presentation of altered mental status is most consistent with delirium. Symptoms could be multifactorial in the setting of chronic alcohol use/withdrawal vs metabolic derangements vs concern for thyroid  storm. Per EMR patient has a chronic history of alcohol use and had last drink 1 week ago vs August.  Patient was also intermittently agitated hallucinating, voiding on the floor and pulling out Ivs. PCCM was consulted and deferred placement at this time.  On assessment patient was unable to cooperate with exam due to being sedated on Ativan . Patient was resting comfortably in bed in no acute distress. Agree with the primary team with continuing with CIWA protocol and symptomatic triggered Ativan  to help address possible withdrawal symptoms.  Patient had a CIWA score of as high as 27.   Will continue to follow patient while admitted and will further assess patient as somnolence improves.  Will attempt to discuss further collateral with family members also and assessment of this patient.  Dispo location will be deferred until after a formal assessment.   Diagnoses:  Active Hospital problems: Principal Problem:   Hypotension    Plan  ## Safety and Observation Level:  - Based on my clinical evaluation, I estimate the patient to be at low risk of self harm in the current setting - At this time, we recommend a routine level of observation. This decision  is based on my review of the chart including patient's history and current presentation, interview of the patient, mental status examination, and consideration of suicide risk including evaluating suicidal ideation, plan, intent, suicidal or self-harm behaviors, risk factors, and protective factors. This judgment is based on our ability to directly address suicide risk, implement suicide prevention strategies and develop a safety plan while the patient is in the clinical setting. Please contact our team if there is a concern that risk level has changed.  ## Medications:  -- Continue with CIWA protocol as ordered per primary team with symptomatic triggered Ativan   -- Restart home psychotropic medications once tolerating PO -- Lexapro  20 mg daily  ## Medical Decision Making Capacity:  -- Not specifically assessed  ## Further Work-up:  -- Per primary team -- most recent EKG on 9/24 had QtC of 455 -- Pertinent labwork reviewed earlier this admission includes: CBC, CMP, Lipase, TSH, T4   ## Disposition:  -- TBD  ## Behavioral / Environmental:  -- Continue to monitor for withdrawals   ##Legal Status -- UTA   Thank you for this consult request. Recommendations have been communicated to the primary team.  We will follow at this time.   [name], MD   New History   Relevant Aspects of Hospital Course:  Admitted on 03/02/2024 for low blood pressure of unclear etiology. Per provider on 9/23:   In the ER, hypotensive with SBP's in the 70's.  Creatinine elevated 4.14 above baseline.  Alert and oriented x 2.  Received 2L IVF with improvement of his BP.  Non-contrast CT head revealed generalized cerebral atrophy with chronic inferior cerebellar infarcts and chronic lacunar infarcts within the pons and basal ganglia on the right.  No acute intracranial abnormality.     Per patient's family members at bedside, his wife and daughter, his mentation is at baseline.  He has had a decline in  memory for years.  Patient Report:   Patient was assessed at bedside.  Upon approach patient was heavily sedated and undergoing an ultrasound.  Patient was unable to cooperate and engage in interview.  Patient was resting comfortably in bed and did not appear to be in acute distress.  Collateral information:  No one was bedside, will obtain later during admission  Psychiatric History:  Unable to collect history from patient due to sedation and no one bedside for additional collateral.  Will continue to assess throughout admission  Family psych history: UTA   Social History:  Tobacco use: current cigar and cigarette use per EMR  Alcohol use: chronic  Drug use: None reported in EMR   Family History:  The patient's family history includes Allergic rhinitis in his sister; Asthma in his grandson; Breast cancer in his mother; Diabetes in his mother; Hypertension in his mother; Leukemia in his father; Stroke in his mother.  Medical History: Past Medical History:  Diagnosis Date  . Angio-edema   . 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 Asthma   . GERD (gastroesophageal reflux disease)   . History of blood transfusion   . HTN (hypertension)   . Seizures (HCC)    last seizure in 2023 per patient  . Ulcer     Surgical History: Past Surgical History:  Procedure Laterality Date  . CLOSED REDUCTION NASAL FRACTURE  11/19/2011   Procedure: CLOSED REDUCTION NASAL FRACTURE;  Surgeon: Marlyce Finer, MD;  Location: [facility] OR;  Service: ENT;  Laterality: N/A;  CLOSED REDUCTION NASAL AND NASAL SEPTAL FRACTURE WITH STABILIZATION  . No surgical      Medications:   Current Facility-Administered Medications:  .  0.9 %  sodium chloride  infusion, , Intravenous, Continuous, Kc, Ramesh, MD, Last Rate: 125 mL/hr at 03/03/24 1558, New Bag at 03/03/24 1558 .  acetaminophen  (TYLENOL ) tablet 650 mg, 650 mg, Oral, Q6H PRN, Hall, Carole N, DO .  albuterol  (PROVENTIL ) (2.5 MG/3ML) 0.083% nebulizer solution 2.5 mg,  2.5 mg, Inhalation, Q6H PRN, Hall, Carole N, DO .  aspirin  EC tablet 81 mg, 81 mg, Oral, Daily, Hall, Carole N, DO, 81 mg at 03/03/24 1130 .  dexamethasone  (DECADRON ) injection 8 mg, 8 mg, Intravenous, TID, Kc, Ramesh, MD .  escitalopram  (LEXAPRO ) tablet 20 mg, 20 mg, Oral, Daily, Kc, Ramesh, MD .  folic acid  (FOLVITE ) tablet 1 mg, 1 mg, Oral, Daily, Kc, Ramesh, MD, 1 mg at 03/03/24 1130 .  heparin  injection 5,000 Units, 5,000 Units, Subcutaneous, Q8H, Hall, Carole N, DO, 5,000 Units at 03/03/24 1635 .  LORazepam  (ATIVAN ) tablet 1-4 mg, 1-4 mg, Oral, Q1H PRN, 4 mg at 03/03/24 1129 **OR** LORazepam  (ATIVAN ) injection 1-4 mg, 1-4 mg, Intravenous, Q1H PRN, Kc, Ramesh, MD .  multivitamin with minerals tablet 1 tablet, 1 tablet, Oral, Daily, Hall, Carole N, DO, 1 tablet at 03/03/24 1130 .  polyethylene glycol (MIRALAX  / GLYCOLAX ) packet 17 g, 17 g, Oral, Daily PRN, Hall, Carole N, DO .  potassium chloride  SA (KLOR-CON  M) CR tablet 40 mEq, 40 mEq, Oral, Once, Kc, Ramesh, MD .  prochlorperazine  (COMPAZINE ) injection 5 mg, 5 mg, Intravenous, Q6H PRN, Shona Laurence  N, DO .  propranolol  (INDERAL ) tablet 10 mg, 10 mg, Oral, TID, Kc, Ramesh, MD, 10 mg at 03/03/24 1636 .  propylthiouracil  (PTU) tablet 50 mg, 50 mg, Oral, Q8H, Kc, Ramesh, MD .  thiamine  (VITAMIN B1) tablet 100 mg, 100 mg, Oral, Daily, Hall, Carole N, DO, 100 mg at 03/03/24 1130 .  topiramate  (TOPAMAX ) tablet 100 mg, 100 mg, Oral, BID, Hall, Carole N, DO, 100 mg at 03/03/24 1129  Allergies: Allergies  Allergen Reactions  . Citrus Swelling  . Fruit Extracts Swelling    Pt experiences swelling with most fruits  . Chlorthalidone  Other (See Comments)    Hyponatremia       Objective  Vital signs:  Temp:  [97.9 F (36.6 C)-98.5 F (36.9 C)] 97.9 F (36.6 C) (09/24 1457) Pulse Rate:  [69-181] 122 (09/24 1634) Resp:  [14-32] 19 (09/24 1457) BP: (86-142)/(57-95) 136/84 (09/24 1634) SpO2:  [76 %-100 %] 97 % (09/24 1634) Weight:  [31 kg]  68 kg (09/23 2059)  Psychiatric Specialty Exam:  Presentation  General Appearance: -- (Sedated)  Eye Contact:None  Speech:-- (Sedated, UTA)  Speech Volume:Other (comment) (Sedated, UTA)  Handedness:No data recorded  Mood and Affect  Mood:-- (Sedated, UTA)  Affect:-- (Sedated, UTA)   Thought Process  Thought Processes:Other (comment) (Sedated, UTA)  Descriptions of Associations:-- (Sedated, UTA)  Orientation:Other (comment) (Sedated, UTA)  Thought Content:Other (comment) (Sedated, UTA)  History of Schizophrenia/Schizoaffective disorder:No data recorded Duration of Psychotic Symptoms:No data recorded Hallucinations:Hallucinations: -- (Sedated, UTA)  Ideas of Reference:-- (Sedated, UTA)  Suicidal Thoughts:Suicidal Thoughts: -- (Sedated, UTA)  Homicidal Thoughts:Homicidal Thoughts: -- (Sedated, UTA)   Sensorium  Memory:Other (comment) (Sedated, UTA)  Judgment:Other (comment) (Sedated, UTA)  Insight:Other (comment) (Sedated, UTA)   Investment banker, operational (comment) (Sedated, UTA)  Attention Span:Other (comment) (Sedated, UTA)  Recall:Other (comment) (Sedated, UTA)  Fund of Knowledge:Other (comment) (Sedated, UTA)  Language:Other (comment) (Sedated, UTA)   Psychomotor Activity  Psychomotor Activity:Psychomotor Activity: Other (comment) (Sedated, UTA)   Assets  Assets:Other (comment) (Sedated, UTA)   Sleep  Sleep:Sleep: -- (Sedated, UTA)   Physical Exam: Physical Exam Stable on RA ROS UTA  Blood pressure 136/84, pulse (!) 122, temperature 97.9 F (36.6 C), temperature source Oral, resp. rate 19, height 5' 4 (1.626 m), weight 68 kg, SpO2 97%. Body mass index is 25.73 kg/m.

## 2024-03-03 NOTE — ED Notes (Signed)
 Unable to give morning meds at this time due to pharm verification

## 2024-03-03 NOTE — Significant Event (Signed)
 Ordered NGT tube for meds. Cortrak team unabel to get to him today Pt slightly more awake, has been sedates since late morning I came to see him again, PCCM team also reevaluated at bedside ABG ordered. And PH,PO2, PCO2 stable he is protecting airway Changed to Progressive bed for close monitoring, PCCM folowing low threshold for Icu transfer.SABRA

## 2024-03-03 NOTE — ED Notes (Signed)
Report  given to Jada RN ?

## 2024-03-03 NOTE — Evaluation (Signed)
 Occupational Therapy Evaluation Patient Details Name: Casey Warren MRN: [id number] DOB: December 31, 1960 Today's Date: 03/03/2024   History of Present Illness   Pt is 63 yo with hypotension referred from PCP. 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  PMH significant of seizures, PUD, GERD, hypertension, anemia, alcohol use, anxiety.     Clinical Impressions Pt resting in bed, confused, inconsistent with answering questions, poor historian. Pt A/Ox1 only. Per history, lives with wife, unsure on PLOF. Pt currently limited significantly due to confusion, speaking in tangents at times. Pt with extremely long nails limiting FM skills, grip, ability to don/doff socks. Earlier today Pt walked 300 feet with PT, but was unable to take a step due to dragging BLEs and too weak to move more than 3-4 inches across floor. Pt unable to don/doff socks, and with B stiffness in shoulders has difficulty with grooming and UB dressing/bathing. Pt would benefit from postacute rehab <3hrs/day to maximize participation in ADLs and consistency with ambulation. Will continue to see acutely to progress as able.      If plan is discharge home, recommend the following:   A lot of help with walking and/or transfers;A lot of help with bathing/dressing/bathroom;Assistance with cooking/housework;Direct supervision/assist for medications management;Assist for transportation;Help with stairs or ramp for entrance     Functional Status Assessment   Patient has had a recent decline in their functional status and demonstrates the ability to make significant improvements in function in a reasonable and predictable amount of time.     Equipment Recommendations   Other (comment) (defer)     Recommendations for Other Services         Precautions/Restrictions   Precautions Precautions:  Fall Recall of Precautions/Restrictions: Impaired Restrictions Weight Bearing Restrictions Per Provider Order: No     Mobility Bed Mobility Overal bed mobility: Needs Assistance Bed Mobility: Supine to Sit, Sit to Supine     Supine to sit: Min assist Sit to supine: Mod assist   General bed mobility comments: min A to assist to EOB, mod A return to bed for BLEs, drastically different from CGA earlier today with PT    Transfers Overall transfer level: Needs assistance Equipment used: Rolling walker (2 wheels) Transfers: Sit to/from Stand, Bed to chair/wheelchair/BSC Sit to Stand: Min assist     Step pivot transfers: Max assist     General transfer comment: min A for STS from stretcher, max A for transfer due to B foot dragging. Able to ambulate 300 feet earlier today with PT      Balance Overall balance assessment: Needs assistance Sitting-balance support: No upper extremity supported, Feet supported Sitting balance-Leahy Scale: Fair     Standing balance support: Single extremity supported, During functional activity Standing balance-Leahy Scale: Poor Standing balance comment: reliant on external support                           ADL either performed or assessed with clinical judgement   ADL Overall ADL's : Needs assistance/impaired     Grooming: Moderate assistance   Upper Body Bathing: Maximal assistance;Sitting   Lower Body Bathing: Maximal assistance;Sit to/from stand   Upper Body Dressing : Maximal assistance   Lower Body Dressing: Maximal assistance;Sit to/from stand   Toilet Transfer: Maximal assistance;Rolling walker (2 wheels);BSC/3in1   Toileting- Clothing Manipulation and Hygiene: Maximal assistance  General ADL Comments: Pt with completely different function from PT eval earlier today. Amb 300 feet with PT, not able to take a single step with me, feet were dragging and too weak to move feet more than 3-4 inches at a time. Pt  with poor FM skills due to long nails, stiff shoulders, difficulty reaching behind head, needs help with dressing/bathing.     Vision         Perception         Praxis         Pertinent Vitals/Pain Pain Assessment Pain Assessment: No/denies pain     Extremity/Trunk Assessment Upper Extremity Assessment Upper Extremity Assessment: Generalized weakness;RUE deficits/detail;LUE deficits/detail RUE Deficits / Details: stiff in B shoulders, very long fingernails affecting FM skills and getting in the way of gripping. RUE: Shoulder pain with ROM RUE Coordination: decreased fine motor LUE Deficits / Details: stiff in B shoulders, very long fingernails affecting FM skills and getting in the way of gripping. LUE: Shoulder pain with ROM LUE Coordination: decreased fine motor   Lower Extremity Assessment Lower Extremity Assessment: Defer to PT evaluation   Cervical / Trunk Assessment Cervical / Trunk Assessment: Normal   Communication Communication Communication: Impaired Factors Affecting Communication: Reduced clarity of speech   Cognition Arousal: Alert Behavior During Therapy: Flat affect, Restless Cognition: Cognition impaired   Orientation impairments: Place, Situation, Time       Executive functioning impairment (select all impairments): Reasoning, Problem solving OT - Cognition Comments: Pt very confused, frequently asks if he's at a school, or saying he is at work right now. Pt oriented to self only.                 Following commands: Impaired Following commands impaired: Follows one step commands with increased time, Follows one step commands inconsistently     Cueing  General Comments   Cueing Techniques: Verbal cues;Gestural cues;Tactile cues;Visual cues      Exercises     Shoulder Instructions      Home Living Family/patient expects to be discharged to:: Private residence Living Arrangements: Spouse/significant other Available Help at  Discharge: Family Type of Home: House Home Access: Stairs to enter Secretary/administrator of Steps: 6 Entrance Stairs-Rails: Can reach both Home Layout: One level     Bathroom Shower/Tub: Chief Strategy Officer: Standard     Home Equipment: TEFL teacher Comments: Pt not reliable historian      Prior Functioning/Environment Prior Level of Function : Patient poor historian/Family not available             Mobility Comments: pt reports he does not use an AD      OT Problem List: Decreased strength;Decreased range of motion;Decreased activity tolerance;Impaired balance (sitting and/or standing);Decreased cognition;Decreased safety awareness;Impaired UE functional use   OT Treatment/Interventions: Self-care/ADL training;Therapeutic exercise;Energy conservation;DME and/or AE instruction;Therapeutic activities;Patient/family education;Balance training      OT Goals(Current goals can be found in the care plan section)   Acute Rehab OT Goals Patient Stated Goal: not able to participatei n setting goals OT Goal Formulation: Patient unable to participate in goal setting Time For Goal Achievement: 03/17/24 Potential to Achieve Goals: Good   OT Frequency:  Min 2X/week    Co-evaluation              AM-PAC OT 6 Clicks Daily Activity     Outcome Measure Help from another person eating meals?: A Little Help from another person taking care of  personal grooming?: A Lot Help from another person toileting, which includes using toliet, bedpan, or urinal?: A Lot Help from another person bathing (including washing, rinsing, drying)?: A Lot Help from another person to put on and taking off regular upper body clothing?: A Lot Help from another person to put on and taking off regular lower body clothing?: A Lot 6 Click Score: 13   End of Session Equipment Utilized During Treatment: Gait belt;Rolling walker (2 wheels) Nurse Communication: Mobility  status  Activity Tolerance: Patient tolerated treatment well Patient left: in bed;with call bell/phone within reach;with bed alarm set  OT Visit Diagnosis: Unsteadiness on feet (R26.81);Other abnormalities of gait and mobility (R26.89);Muscle weakness (generalized) (M62.81);Other symptoms and signs involving cognitive function                Time: 1202-1225 OT Time Calculation (min): 23 min Charges:  OT General Charges $OT Visit: 1 Visit OT Evaluation $OT Eval Moderate Complexity: 1 Mod OT Treatments $Self Care/Home Management : 8-22 mins  [place], OTR/L   Elouise JONELLE Bott 03/03/2024, 1:46 PM

## 2024-03-03 NOTE — Progress Notes (Signed)
 Was able to get patient awake to give some medications safely   Message pharmacy about others.

## 2024-03-03 NOTE — ED Notes (Signed)
 Report given to Caitlyn RN per MD cortisol check and medication to be re timed until this afternoon when pt has IV access

## 2024-03-04 ENCOUNTER — Inpatient Hospital Stay (HOSPITAL_COMMUNITY)

## 2024-03-04 DIAGNOSIS — N179 Acute kidney failure, unspecified: Secondary | ICD-10-CM | POA: Diagnosis not present

## 2024-03-04 DIAGNOSIS — G9341 Metabolic encephalopathy: Secondary | ICD-10-CM | POA: Diagnosis not present

## 2024-03-04 DIAGNOSIS — E0500 Thyrotoxicosis with diffuse goiter without thyrotoxic crisis or storm: Secondary | ICD-10-CM | POA: Insufficient documentation

## 2024-03-04 DIAGNOSIS — I959 Hypotension, unspecified: Secondary | ICD-10-CM | POA: Diagnosis not present

## 2024-03-04 LAB — CBC
HCT: 36.4 % — ABNORMAL LOW (ref 39.0–52.0)
Hemoglobin: 12.3 g/dL — ABNORMAL LOW (ref 13.0–17.0)
MCH: 29.9 pg (ref 26.0–34.0)
MCHC: 33.8 g/dL (ref 30.0–36.0)
MCV: 88.3 fL (ref 80.0–100.0)
Platelets: 254 K/uL (ref 150–400)
RBC: 4.12 MIL/uL — ABNORMAL LOW (ref 4.22–5.81)
RDW: 12.8 % (ref 11.5–15.5)
WBC: 6.2 K/uL (ref 4.0–10.5)
nRBC: 0 % (ref 0.0–0.2)

## 2024-03-04 LAB — T3, FREE: T3, Free: 9.8 pg/mL — ABNORMAL HIGH (ref 2.0–4.4)

## 2024-03-04 LAB — BASIC METABOLIC PANEL WITH GFR
Anion gap: 14 (ref 5–15)
BUN: 56 mg/dL — ABNORMAL HIGH (ref 8–23)
CO2: 17 mmol/L — ABNORMAL LOW (ref 22–32)
Calcium: 9.6 mg/dL (ref 8.9–10.3)
Chloride: 113 mmol/L — ABNORMAL HIGH (ref 98–111)
Creatinine, Ser: 1.59 mg/dL — ABNORMAL HIGH (ref 0.61–1.24)
GFR, Estimated: 48 mL/min — ABNORMAL LOW (ref >60)
Glucose, Bld: 128 mg/dL — ABNORMAL HIGH (ref 70–99)
Potassium: 4 mmol/L (ref 3.5–5.1)
Sodium: 144 mmol/L (ref 135–145)

## 2024-03-04 LAB — THYROID STIMULATING IMMUNOGLOBULIN: Thyroid Stimulating Immunoglob: 5.08 IU/L — ABNORMAL HIGH (ref 0.00–0.55)

## 2024-03-04 LAB — LIPASE, BLOOD: Lipase: 44 U/L (ref 11–51)

## 2024-03-04 LAB — THYROTROPIN RECEPTOR AUTOABS: Thyrotropin Receptor Ab: 8.45 IU/L — ABNORMAL HIGH (ref 0.00–1.75)

## 2024-03-04 MED ORDER — METHIMAZOLE 10 MG PO TABS
20.0000 mg | ORAL_TABLET | Freq: Every day | ORAL | Status: DC
  Administered 2024-03-04 – 2024-03-05 (×2): 20 mg via ORAL
  Filled 2024-03-04 (×2): qty 2

## 2024-03-04 MED ORDER — THIAMINE HCL 100 MG/ML IJ SOLN
400.0000 mg | Freq: Three times a day (TID) | INTRAVENOUS | Status: AC
Start: 2024-03-04 — End: 2024-03-07
  Administered 2024-03-04 – 2024-03-07 (×9): 400 mg via INTRAVENOUS
  Filled 2024-03-04 (×10): qty 4

## 2024-03-04 MED ORDER — ATENOLOL 50 MG PO TABS
50.0000 mg | ORAL_TABLET | Freq: Every day | ORAL | Status: DC
  Administered 2024-03-04 – 2024-03-05 (×2): 50 mg via ORAL
  Filled 2024-03-04 (×2): qty 1

## 2024-03-04 MED ORDER — ENSURE PLUS HIGH PROTEIN PO LIQD
237.0000 mL | Freq: Two times a day (BID) | ORAL | Status: DC
  Administered 2024-03-04 – 2024-03-07 (×5): 237 mL via ORAL

## 2024-03-04 MED ORDER — DEXAMETHASONE SODIUM PHOSPHATE 4 MG/ML IJ SOLN
4.0000 mg | Freq: Three times a day (TID) | INTRAMUSCULAR | Status: DC
  Administered 2024-03-04 – 2024-03-06 (×9): 4 mg via INTRAVENOUS
  Filled 2024-03-04 (×9): qty 1

## 2024-03-04 MED ORDER — DEXTROSE-SODIUM CHLORIDE 5-0.9 % IV SOLN: INTRAVENOUS | Status: AC

## 2024-03-04 NOTE — Progress Notes (Signed)
     Patient Name: ASCHER SCHROEPFER           DOB: 1960/10/23  MRN: [id number]      Admission Date: 03/02/2024  Attending Provider: Christobal [place], MD  Primary Diagnosis: Hypotension   Level of care: Progressive   OVERNIGHT EVENT  HPI/ Events of Note TIMM BONENBERGER, 63 y.o. male, was admitted on 03/02/2024 for Hypotension.   Unwitnessed fall Nursing staff was alerted by bed alarm, that patient was exiting bed.  On arrival to room, patient was found laying on the floor facing up.  Response was immediate, patient was found awake.  Vital signs hemodynamically stable. No change in mentation reported, remains A/O x 1 (same as prior). Patient denied pain, no signs of pain reported by RN.   Per nursing report, there are no visible signs of injury or deformities.  No open wounds, bleeding, redness, or swelling noted.   ROM at baseline. Patient was assisted back to bed.   Plan: CT head  Nursing staff to continue monitoring for change in acute symptoms, mobility, and pain. Safety sitter recommended, however none is available.  Orders placed for Posey belt restraint and TeleSitter.   Iman Reinertsen, DNP, ACNPC- [facility] [place]

## 2024-03-04 NOTE — Progress Notes (Addendum)
 Initial Nutrition Assessment  DOCUMENTATION CODES:   Non-severe (moderate) malnutrition in context of chronic illness  INTERVENTION:  Monitor for PO intake - On dysphagia 3 diet as pt has no front teeth  Nursing to assist with meals House trays until mentation improves Ensure Plus High Protein po BID, each supplement provides 350 kcal and 20 grams of protein Continue MVI, Thiamine , Folic acid  supplementation Monitor magnesium, potassium, and phosphorus daily for at least 3 days, MD to replete as needed, as pt is at risk for refeeding syndrome  24 hour calorie count to determine if cortrak needs to be place Depending on calorie count results tomorrow,may require cortrak placement  *Of note pt has allergies to fruit extracts and swelling - need to confirm with family   NUTRITION DIAGNOSIS:   Moderate Malnutrition related to chronic illness (ETOH abuse) as evidenced by mild fat depletion, mild muscle depletion, percent weight loss (17 lb, 10.6% wt loss in 6 months.).  GOAL:   Patient will meet greater than or equal to 90% of their needs   MONITOR:   PO intake, Weight trends, Labs  REASON FOR ASSESSMENT:   Consult Assessment of nutrition requirement/status  ASSESSMENT:  63 y.o. male with PMH of HTN, HLD,  hx of CVA, chronic anxiety/depression, ETOH abuse (last drink 1 week PTA), admitted for hypotension of unclear etiology.  Pt lying in bed in restraints and posey belt. Had a fall last night, no change in mentation, CT head negative. No family bedside, pt ws unable to communicate. History obtained from chart.   Pt has been declining in the last 2 and a half months, not eating well and losing weight. Has been having memory loss for the last 3-4 years. In the past 2 days PTA pt has not been himself and has been more confused and agitated. Has a history of ETOH abuse but has been cutting down on consumption and has been weaning himself from beer in the last month. Last known  alcoholic drink was a glass of wine 1 week ago.   Per weight history, pt's weight has been declining since March of this year, going from 160 lbs to now 143 lbs, a 17 lb, 10.6% wt loss in 6 months. Which is clinically significant for timeframe. On exam pt presents with mild muscle and fat deficits.   Pt Placed NPO on 9/24 due to AMS likely related to ETOH withdraw. Diet advanced today to SOFT. Pt has no front teeth, will downgrade diet to help with poor dentition. Likely pt will not be bale to stay alert throughout the day to have sufficient po intake. Did take meds In applesauce when cued by nursing. Calorie count today to determine need for cortrak.   Admit weight: 68 kg Current weight: 65.1 kg   Average Meal Intake: NPO  Nutritionally Relevant Medications: Scheduled Meds:  feeding supplement  237 mL Oral BID BM   folic acid   1 mg Oral Daily   multivitamin with minerals  1 tablet Oral Daily   topiramate   100 mg Oral BID   Labs Reviewed: BUN 56 Creatinine 1.59 GFR 48 CBG ranges from 107-139 mg/dL over the last 24 hours No recent A1C   NUTRITION - FOCUSED PHYSICAL EXAM:  Flowsheet Row Most Recent Value  Orbital Region Mild depletion  Upper Arm Region Mild depletion  Thoracic and Lumbar Region Mild depletion  Buccal Region Mild depletion  Temple Region Mild depletion  Clavicle Bone Region Mild depletion  Clavicle and Acromion Bone Region Mild  depletion  Scapular Bone Region Mild depletion  Dorsal Hand Unable to assess  [[place]]  Patellar Region Mild depletion  Anterior Thigh Region Mild depletion  Posterior Calf Region Mild depletion  Edema (RD Assessment) None  Hair Reviewed  Eyes Reviewed  Mouth Reviewed  [No front teeth]  Skin Reviewed  Nails Unable to assess    Diet Order:   Diet Order             DIET DYS 3 Room service appropriate? No; Fluid consistency: Thin  Diet effective now                   EDUCATION NEEDS:   Not appropriate for education at  this time  Skin:  Skin Assessment: Reviewed RN Assessment  Last BM:  PTA  Height:   Ht Readings from Last 1 Encounters:  03/02/24 5' 4 (1.626 m)    Weight:   Wt Readings from Last 1 Encounters:  03/04/24 65.1 kg    BMI:  Body mass index is 24.64 kg/m.  Estimated Nutritional Needs:   Kcal:  1800-2000 kcal  Protein:  90-110 gm  Fluid:  >1.8L/day   Olivia Kenning, RD Registered Dietitian  See Amion for more information

## 2024-03-04 NOTE — Progress Notes (Signed)
 At approximately 1:25 am bed alarm heard and upon entering pt room pt was found on floor. Vitals done WNL no bleeding swelling noted. MD was informed attempt was made to call family no answer left message to call uinit.

## 2024-03-04 NOTE — Consult Note (Signed)
 NAME:  Casey Warren, MRN:  [id number], DOB:  December 13, 1960, LOS: 2 ADMISSION DATE:  03/02/2024, CONSULTATION DATE:  03/03/24 REFERRING MD:  Dr. Christobal, CHIEF COMPLAINT:  AMS   History of Present Illness:  HPI obtained from EMR as pt remains encephalopathic.   18 yoM with PMH of tobacco abuse, ETOH abuse, HTN, HLD, and GERD who presented to ER 9/23 after being sent from PCP for hypotension.  No family at bedside presently but initially reported poor PO intake for several days and low BP for ~2 weeks and that he stopped drinking ETOH at the end of August vs 1 week ago.  Unclear when last too hypertensive medications.  Reported decline in memory for years.  Denied recent N/V/D, or fevers.  Initial SBP in 70's on arrival with normal lactic which resolved after 2L NS and remains confused, oriented 1-2.  Has been afebrile, remains tachycardic 110, and normoxia.  AKI noted BUN/ sCr 86/ 2.63, K 3.3, normal WBC, Hgb 11, Na 139, normal glucose, trop neg, UA neg, ETOH neg.  CTH neg for acute abnormalities.  CXR neg.  Admitted to [facility] for hypotension and AKI.  Further workup noted for cortisol 2.4, TSH <0.1, FT4 3.25.  Pt had worsening confusion 9/24 hallucinating, voided in the floor and pulled out his IV's.  Ativan  given per CIWA of 27.  Pt then more calm and cooperative, able to ambulate with PT and IV restarted.  PCCM consulted for possible ICU admit given concern of ETOH withdrawal vs thyroid  toxicity.  Pertinent  Medical History  HTN, HLD, anxiety/ depression, ETOH abuse, tobacco abuse, GERD, DMT2  Significant Hospital Events: Including procedures, antibiotic start and stop dates in addition to other pertinent events   9/23 admit [facility]  Interim History / Subjective:  Eating lunch with assistance of wife  Objective    Blood pressure (!) 142/79, pulse (!) 116, temperature 97.9 F (36.6 C), temperature source Oral, resp. rate 20, height 5' 4 (1.626 m), weight 65.1 kg, SpO2 91%.        Intake/Output Summary  (Last 24 hours) at 03/04/2024 1518 Last data filed at 03/04/2024 1153 Gross per 24 hour  Intake 240 ml  Output 2550 ml  Net -2310 ml   Filed Weights   03/02/24 2059 03/04/24 0712 03/04/24 1240  Weight: 68 kg 68 kg 65.1 kg   Physical Exam: General: Chronically ill-appearing, no acute distress, mildly confused HENT: [place], AT, OP clear, MMM Eyes: EOMI, no scleral icterus Respiratory: Mildly tachypneic, clear to auscultation bilaterally.  No crackles, wheezing or rales Cardiovascular: RRR, -M/R/G, no JVD GI: BS+, soft, nontender Extremities:-Edema,-tenderness Neuro: Awake, follows commands, CNII-XII grossly intact    Resolved problem list   Assessment and Plan   Metabolic encephalopathy +/- ETOH withdrawal - reported memory decline for years, unclear baseline.  CTH neg.  Unclear last ETOH, level neg on admit. No obvious infectious etiology, no fever, leukocytosis.  Hypotension, resolved after IVF's, normal lactic, normal trop, CXR neg.  Hx of HTN, unclear when last took antihypertensives  AKI Hypokalemia Multiple endocrine abnormalities with undetectable TSH and elevated FT4, low cortisol P: - Initially consulted for thyroid  storm, criteria scoring 25 with his tachycardia and unclear baseline encephalopathy are confounding.  A score of  > 44 being c/w thyroid  storm, but pt has multiple confounding medical issues which makes it difficult to say for certainty this is early thyroid  storm vs ETOH withdrawal, vs r/o other etiologies.  The absence of fever, jaundice, no reports of  N/V/D, and no obvious signs of HF are reassuring.  - Discussed with Endocrine yesterday for treatment. On PTU - Continue CIWA - No ICU needs at this time. Will sign off  PCCM available as needed Critical care time: n/a   Care Time: 25 min  Slater Staff, M.D. [facility] Pulmonary/Critical Care Medicine 03/04/2024 3:19 PM   Please see Amion for pager number to reach on-call Pulmonary and Critical Care  Team.

## 2024-03-04 NOTE — Progress Notes (Addendum)
 CSW has uploaded required documents for PASSR. PASSR is pending.  PASSR is [id number] E expires on 04/03/2024

## 2024-03-04 NOTE — Progress Notes (Signed)
 RE: Casey Warren Date of Birth: [birth year] Date: 03/04/2024   To Whom It May Concern:  Please be advised that the above-named patient will require a short-term nursing home stay - anticipated 30 days or less for rehabilitation and strengthening.  The plan is for return home.                 MD signature

## 2024-03-04 NOTE — Progress Notes (Addendum)
 PROGRESS NOTE Casey Warren  FMW:[id number] DOB: 05-01-1961 DOA: 03/02/2024 PCP: Celestia Rosaline SQUIBB, NP  Brief Narrative/Hospital Course: Casey Warren is a 63 y.o. male with PMH of  hypertension, hyperlipidemia, chronic anxiety/depression, alcoholism, who presents to the ER, referred by his PCP due to low blood pressures of unclear etiology. Per family members at bedside he has cut down on alcohol consumption.  Last alcoholic drink was about a week ago. In the ER, hypotensive with SBP's in the 70's.  Creatinine elevated 4.14 above baseline.  Alert and oriented x 2.  Received 2L IVF with improvement of his BP.   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  No acute intracranial abnormality.He has had a decline in memory for years. CT chest abd pelvis:No acute findings in the chest, abdomen or pelvis. Scattered bilateral pulmonary nodules measuring up to 5 mm. Non-contrast chest CT can be considered in 12 months if patient is high-risk.  Subjective: Seen and examined today Overnight bp stable HR in 110-120s BP in 160s Noted acute events overnight patient exited bed and fell on the ground CT head glucoscan no acute findings Temperature remains normal Labs shows creatinine has improved to 1.5, BUN 56 CBC stable   Assessment and plan:  Acute encephalopathy/agitation/delirium Alcohol abuse, w/ withdrawals Recent cognitive decline Failure to thrive Severe malnutrition: last Etoh with wine bottle on 02/26/24 per daughter,normally heavy drinker-he had weaned himself from beer for a month. As per daughter for 2 and half month fatigued and for a month not eating well and has lost weight,having memory loss for 3-4 years forgetful/repetitive.He has not been himself recently.  Mentation worse from baseline past day or two. Psych consulted. Cont CIWA Ativan  for withdrawal, managing  hyperthyroidism, continue supportive care Keep on strict fall precaution,  sitter if needed. Now on Posey. Start high dose thiamine .  Hyperthyroidism/concern for hypothyroid crisis Relative adrenal insufficiency w/ low cortisol: Tsh < 0.1, ft4 3.25 on add on, free T3 elevated 9.8 some (9/25) Picture appears mixed with alcohol withdrawal and hyperthyroidism, o hyperpyrexia. Cosyntropin  test reassuring  F/u RAB, TSI Given patient is seriously ill,-discussed with outpatient [facility] Dr Mercie: US  thryoid is resutled- moderately heterogenous thyroid  gland- likely thyroiditis. Received PTU initially will change to methimazole  for easier dosing 20 daily> pending TRAb TS, will likely need to go home on methimazole  ( fu TRAb) Start atenolol  50 daily, taper off Decadron  over few days-I changed to 4mg  tid 9/25  Acute kidney injury Metabolic acidosis Hypo-kalemia Hyperphosphatemia: AKI in the setting of hypotension/poor oral intake.  Creatinine nicely improving with IV fluid hydration Continue IVF, monitor intake output avoid nephrotoxic medication  Has been tachypneic but spo2 stabl,e CXR done NAD. Monitor closely. Recent Labs    03/10/23 1652 07/29/23 1336 08/08/23 2042 03/02/24 1634 03/02/24 2108 03/03/24 0451 03/03/24 1603 03/04/24 0640  BUN 6* 6* 6* 105*  --  86* 71* 56*  CREATININE 0.98 0.92 0.85 4.14* 3.80* 2.63* 1.97* 1.59*  CO2 21* 24 21* 19*  --  19* 16* 17*  K 4.0 4.3 3.6 4.5  --  3.3* 4.2 4.0   Hypotension History of hypertension-PTA on amlodipine /valsartan /HCTZ: Question if from multiple antihypertensives,poor intake.Blood pressure stabilized following IV fluid resuscitation.  TSH suppressed cortisol low- see above No evidence of infection on UA and chest x-ray, blood culture NGTD Continue IV hydration.  Starting Ativan  as above  CVA hx: Incidentally noticed on  CT head -old infarcts.  Continue home aspirin  and statin.   Ambulatory dysfunction Fall in the  hospital: No acute trauma on the CT head 9/25 am.Keep on for precaution sitter/restraint if needed PT OT eval completed as advised skilled nursing facility once stable   Chronic anxiety/depression Cont  home regimen.  Psych seeing the patient as well  Lung nodules: Fu in 12 m w/ CT chest if high risk  Mobility: PT Orders: Active PT Follow up Rec: Skilled Nursing-Short Term Rehab (<3 Hours/Day)03/03/2024 1202   DVT prophylaxis: heparin  injection 5,000 Units Start: 03/02/24 2200 Code Status:   Code Status: Full Code Family Communication: plan of care discussed with patient at bedside. I had called and updated his daughter Casey Warren MEDICINE, and I had updated at the bedside. I came back to see him again and udpated his wife at bedside at 3.41 pm- he is more awake HR in110, he ate some. Wife is aware about his serious illness and prognosis remains to be seen.  Patient status is: Remains hospitalized because of severity of illness Level of care: Progressive   Dispo: The patient is from: home            Anticipated disposition: needs SNF eventually Objective: Vitals last 24 hrs: Vitals:   03/04/24 0317 03/04/24 0712 03/04/24 0719 03/04/24 0921  BP: 127/66  (!) 155/94 (!) 164/90  Pulse: (!) 111  (!) 117 (!) 131  Resp: (!) 24  (!) 24 (!) 28  Temp: 98.7 F (37.1 C)  98.7 F (37.1 C)   TempSrc: Axillary  Oral   SpO2: 90%  91% 90%  Weight:  68 kg    Height:        Physical Examination: General exam: aaox self on mitten and posy restraint HEENT:Oral mucosa moist, Ear/Nose WNL grossly Respiratory system: Bilaterally clear BS,no use of accessory muscle Cardiovascular system: S1 & S2 +, No JVD. HR fast Gastrointestinal system: Abdomen soft,NT,ND, BS+ Nervous System: Alert, awake, moving all extremities Extremities: extremities warm, leg edema neg Skin: No rashes,no icterus. MSK: Normal muscle bulk,tone, power.   Medications reviewed:  Scheduled Meds:  aspirin  EC  81 mg Oral Daily    atenolol   50 mg Oral Daily   dexamethasone  (DECADRON ) injection  4 mg Intravenous TID   escitalopram   20 mg Oral Daily   folic acid   1 mg Oral Daily   heparin   5,000 Units Subcutaneous Q8H   Influenza vac split trivalent PF  0.5 mL Intramuscular Tomorrow-1000   methIMAzole   20 mg Oral Daily   multivitamin with minerals  1 tablet Oral Daily   thiamine   100 mg Oral Daily   topiramate   100 mg Oral BID   Continuous Infusions:  sodium chloride  Stopped (03/04/24 0912)   Diet: Diet Order             DIET SOFT Room service appropriate? Yes; Fluid consistency: Thin  Diet effective now                    Data Reviewed: I have personally reviewed following labs and imaging studies ( see epic result tab) CBC: Recent Labs  Lab 03/02/24 1634 03/02/24 2108 03/03/24 0451 03/04/24 0640  WBC 6.2 6.0 5.3 6.2  NEUTROABS 4.5  --   --   --   HGB 14.2 13.1 11.1* 12.3*  HCT 41.5 39.8 32.6* 36.4*  MCV 88.3 91.5 88.6 88.3  PLT 276 256 211 254   CMP: Recent Labs  Lab 03/02/24  1634 03/02/24 2108 03/03/24 0451 03/03/24 1603 03/04/24 0640  NA 138  --  139 142 144  K 4.5  --  3.3* 4.2 4.0  CL 100  --  110 113* 113*  CO2 19*  --  19* 16* 17*  GLUCOSE 139*  --  114* 113* 128*  BUN 105*  --  86* 71* 56*  CREATININE 4.14* 3.80* 2.63* 1.97* 1.59*  CALCIUM  9.7  --  8.5* 9.4 9.6  MG  --   --  2.0  --   --   PHOS  --   --  5.0*  --   --    GFR: Estimated Creatinine Clearance: 38.8 mL/min (A) (by C-G formula based on SCr of 1.59 mg/dL (H)). Recent Labs  Lab 03/02/24 1634  AST 39  ALT 56*  ALKPHOS 93  BILITOT 1.6*  PROT 7.1  ALBUMIN 3.4*    Recent Labs  Lab 03/03/24 0053 03/04/24 0640  LIPASE 103* 44   No results for input(s): AMMONIA in the last 168 hours. Coagulation Profile: No results for input(s): INR, PROTIME in the last 168 hours. Unresulted Labs (From admission, onward)     Start     Ordered   03/04/24 0500  CBC  Daily,   R      03/03/24 1017   03/03/24 1155   Thyroid  stimulating immunoglobulin  Once,   R        03/03/24 1154   03/03/24 0500  Basic metabolic panel  Daily,   R      03/02/24 2032   03/02/24 1626  Culture, blood (Routine X 2) w Reflex to ID Panel  BLOOD CULTURE X 2,   R      03/02/24 1626           Antimicrobials/Microbiology: Anti-infectives (From admission, onward)    None         Component Value Date/Time   SDES BLOOD RIGHT HAND 03/02/2024 2108   SPECREQUEST  03/02/2024 2108    BOTTLES DRAWN AEROBIC ONLY Blood Culture adequate volume   CULT  03/02/2024 2108    NO GROWTH 2 DAYS Performed at [facility], [address] [place], [place] [address]    REPTSTATUS PENDING 03/02/2024 2108    Procedures: Mennie LAMY, MD [facility] 03/04/2024, 3:41 PM

## 2024-03-05 DIAGNOSIS — E0500 Thyrotoxicosis with diffuse goiter without thyrotoxic crisis or storm: Secondary | ICD-10-CM | POA: Diagnosis not present

## 2024-03-05 DIAGNOSIS — N179 Acute kidney failure, unspecified: Secondary | ICD-10-CM

## 2024-03-05 DIAGNOSIS — I959 Hypotension, unspecified: Secondary | ICD-10-CM | POA: Diagnosis not present

## 2024-03-05 LAB — CBC
HCT: 35.1 % — ABNORMAL LOW (ref 39.0–52.0)
Hemoglobin: 11.9 g/dL — ABNORMAL LOW (ref 13.0–17.0)
MCH: 29.8 pg (ref 26.0–34.0)
MCHC: 33.9 g/dL (ref 30.0–36.0)
MCV: 88 fL (ref 80.0–100.0)
Platelets: 258 K/uL (ref 150–400)
RBC: 3.99 MIL/uL — ABNORMAL LOW (ref 4.22–5.81)
RDW: 12.8 % (ref 11.5–15.5)
WBC: 8.1 K/uL (ref 4.0–10.5)
nRBC: 0 % (ref 0.0–0.2)

## 2024-03-05 LAB — BASIC METABOLIC PANEL WITH GFR
Anion gap: 15 (ref 5–15)
BUN: 48 mg/dL — ABNORMAL HIGH (ref 8–23)
CO2: 17 mmol/L — ABNORMAL LOW (ref 22–32)
Calcium: 9.5 mg/dL (ref 8.9–10.3)
Chloride: 115 mmol/L — ABNORMAL HIGH (ref 98–111)
Creatinine, Ser: 1.53 mg/dL — ABNORMAL HIGH (ref 0.61–1.24)
GFR, Estimated: 50 mL/min — ABNORMAL LOW (ref >60)
Glucose, Bld: 165 mg/dL — ABNORMAL HIGH (ref 70–99)
Potassium: 4 mmol/L (ref 3.5–5.1)
Sodium: 147 mmol/L — ABNORMAL HIGH (ref 135–145)

## 2024-03-05 MED ORDER — ASPIRIN 81 MG PO CHEW
81.0000 mg | CHEWABLE_TABLET | Freq: Every day | ORAL | Status: DC
  Administered 2024-03-06 – 2024-03-08 (×3): 81 mg
  Filled 2024-03-05 (×3): qty 1

## 2024-03-05 MED ORDER — FREE WATER
200.0000 mL | Freq: Four times a day (QID) | Status: DC
  Administered 2024-03-05 – 2024-03-06 (×3): 200 mL

## 2024-03-05 MED ORDER — JEVITY 1.5 CAL/FIBER PO LIQD
1000.0000 mL | ORAL | Status: DC
  Administered 2024-03-05 – 2024-03-08 (×3): 1000 mL
  Filled 2024-03-05 (×4): qty 1000

## 2024-03-05 MED ORDER — METHIMAZOLE 10 MG PO TABS
20.0000 mg | ORAL_TABLET | Freq: Every day | ORAL | Status: DC
  Administered 2024-03-06 – 2024-03-08 (×3): 20 mg
  Filled 2024-03-05 (×3): qty 2

## 2024-03-05 MED ORDER — PROSOURCE TF20 ENFIT COMPATIBL EN LIQD
60.0000 mL | Freq: Every day | ENTERAL | Status: DC
  Administered 2024-03-05 – 2024-03-10 (×6): 60 mL
  Filled 2024-03-05 (×6): qty 60

## 2024-03-05 MED ORDER — ADULT MULTIVITAMIN W/MINERALS CH
1.0000 | ORAL_TABLET | Freq: Every day | ORAL | Status: DC
  Administered 2024-03-06 – 2024-03-08 (×3): 1
  Filled 2024-03-05 (×3): qty 1

## 2024-03-05 MED ORDER — POLYETHYLENE GLYCOL 3350 17 G PO PACK: 17.0000 g | PACK | Freq: Every day | ORAL | Status: DC | PRN

## 2024-03-05 MED ORDER — THIAMINE HCL 100 MG/ML IJ SOLN
100.0000 mg | Freq: Three times a day (TID) | INTRAMUSCULAR | Status: DC
  Administered 2024-03-07 – 2024-03-12 (×16): 100 mg via INTRAVENOUS
  Filled 2024-03-05 (×16): qty 2

## 2024-03-05 MED ORDER — LORAZEPAM 1 MG PO TABS: 1.0000 mg | ORAL_TABLET | ORAL | Status: AC | PRN

## 2024-03-05 MED ORDER — ATENOLOL 50 MG PO TABS
50.0000 mg | ORAL_TABLET | Freq: Every day | ORAL | Status: DC
Start: 2024-03-06 — End: 2024-03-08
  Administered 2024-03-06 – 2024-03-08 (×3): 50 mg
  Filled 2024-03-05 (×3): qty 1

## 2024-03-05 MED ORDER — TOPIRAMATE 25 MG PO TABS
100.0000 mg | ORAL_TABLET | Freq: Two times a day (BID) | ORAL | Status: DC
Start: 2024-03-05 — End: 2024-03-08
  Administered 2024-03-05 – 2024-03-08 (×6): 100 mg
  Filled 2024-03-05 (×6): qty 4

## 2024-03-05 MED ORDER — ESCITALOPRAM OXALATE 10 MG PO TABS
20.0000 mg | ORAL_TABLET | Freq: Every day | ORAL | Status: DC
  Administered 2024-03-06 – 2024-03-08 (×3): 20 mg
  Filled 2024-03-05 (×3): qty 2

## 2024-03-05 MED ORDER — LORAZEPAM 2 MG/ML IJ SOLN
1.0000 mg | INTRAMUSCULAR | Status: AC | PRN
  Administered 2024-03-05 – 2024-03-06 (×2): 2 mg via INTRAVENOUS
  Filled 2024-03-05 (×2): qty 1

## 2024-03-05 MED ORDER — ACETAMINOPHEN 325 MG PO TABS
650.0000 mg | ORAL_TABLET | Freq: Four times a day (QID) | ORAL | Status: DC | PRN
  Administered 2024-03-06 – 2024-03-07 (×5): 650 mg
  Filled 2024-03-05 (×5): qty 2

## 2024-03-05 MED ORDER — FOLIC ACID 1 MG PO TABS
1.0000 mg | ORAL_TABLET | Freq: Every day | ORAL | Status: DC
  Administered 2024-03-06 – 2024-03-08 (×3): 1 mg
  Filled 2024-03-05 (×3): qty 1

## 2024-03-05 MED ORDER — FREE WATER
100.0000 mL | Status: DC
  Administered 2024-03-05 (×2): 100 mL

## 2024-03-05 NOTE — Procedures (Signed)
 Cortrak  Person Inserting Tube:  Donal Mutton, RD Tube Type:  Cortrak - 43 inches Tube Size:  10 Tube Location:  Right nare Secured by: Bridle Initial Placement:  Gastric Technique Used to Measure Tube Placement:  Marking at nare/corner of mouth Cortrak Secured At:  64 cm Initial Placement Verification:  Cortrak device (Registered Dieticians Only)   Cortrak Tube Team Note:  Consult received to place a Cortrak feeding tube.   No x-ray is required. RN may begin using tube.   If the tube becomes dislodged please keep the tube and contact the Cortrak team at [email or web address] for replacement.  If after hours and replacement cannot be delayed, place a NG tube and confirm placement with an abdominal x-ray.   Mutton Donal MS, RDN, LDN, CNSC Registered Dietitian 3 Clinical Nutrition RD Inpatient Contact Info in Amion

## 2024-03-05 NOTE — Progress Notes (Addendum)
 Calorie Count Note Pt with very poor po intake, meeting <10% of his estimated needs. Continues to be lethargic and agitated requiring Ativan . In restraints, mitts, and posey belt. Pt sleeping on visit, unable to wake pt. Did not have breakfast this morning. RN offered Ensure that pt refused. Took half of his medicine per RN. Cortrak to be placed today, pt at refeeding risk, advacne tube feeds slowly and monitor lytes.   24 hour calorie count ordered.  Diet: Dysphagia 3, thin liquids  Supplements: Ensure High Protein, Magic cups   Day 1, 9/25:  Lunch: 30% mashed potatoes,  bites of pot roast (50 kcal, 2 gm protein) Dinner: 0% 9/26:  Breakfast  Supplements: 25% Ensure, bites of magic cup (90 kcal, 5 gm protein)  Total intake: 140 kcal (7% of minimum estimated needs)  7 gm protein (8% of minimum estimated needs)  Estimated Nutritional Needs:  Kcal:  1800-2000 kcal Protein:  90-110 gm Fluid:  >1.8L/day  INTERVENTION:  Initiate nocturnal tube feeding via Cortrak once placed and confirmed: Jevity 1.5 at 20 ml/hr and increase by 10 ml every 8 hours until goal of 41 ml/h (984 ml per day) Proosource TF20 Daily 100 ml FWF Q4H   Provides 1556 kcal, 83 gm protein, 760 ml free water  daily (1360 ml TF + FWF)  Monitor for PO intake - On dysphagia 3 diet as pt has no front teeth  Nursing to assist with meals House trays until mentation improves Ensure Plus High Protein po BID, each supplement provides 350 kcal and 20 grams of protein Continue MVI, Thiamine , Folic acid  supplementation Monitor magnesium, potassium, and phosphorus daily for at least 3 days, MD to replete as needed, as pt is at risk for refeeding syndrome    *Of note pt has allergies to fruit extracts and swelling - need to confirm with family    NUTRITION DIAGNOSIS:    Moderate Malnutrition related to chronic illness (ETOH abuse) as evidenced by mild fat depletion, mild muscle depletion, percent weight loss (17 lb, 10.6% wt  loss in 6 months.).   GOAL:    Patient will meet greater than or equal to 90% of their needs   Olivia Kenning, RD Registered Dietitian  See Amion for more information

## 2024-03-05 NOTE — Progress Notes (Signed)
 PT Cancellation Note  Patient Details Name: Casey Warren MRN: [id number] DOB: 10-24-1960   Cancelled Treatment:    Reason Eval/Treat Not Completed: (P) Fatigue/lethargy limiting ability to participate, pt sleeping, unable to roused for participation. Will check back as schedule allows to continue with PT POC.  Therisa SAUNDERS. PTA Acute Rehabilitation Services Office: [phone]    Therisa CHRISTELLA Boor 03/05/2024, 3:42 PM

## 2024-03-05 NOTE — Consult Note (Signed)
 [facility] Psychiatry New Face-to-Face Psychiatric Evaluation   Service Date: March 05, 2024 LOS:  LOS: 3 days    Primary Psych Diagnosis   Alcohol Use Disorder, Severe Delirium   Assessment  Casey Warren is a 63 y.o. male admitted medically for 03/02/2024  3:56 PM for low blood pressures of unclear etiology.  Psychiatrically he carries a diagnosis of anxiety, depression and alcoholism and has a past medical history of hypertension, anemia, GERD and seizures. Psychiatry was consulted for delirium/agitation by Dr. Mennie .   His current presentation of altered mental status is most consistent with delirium. Symptoms could be multifactorial in the setting of chronic alcohol use/withdrawal vs metabolic derangements vs concern for thyroid  storm. Per EMR patient has a chronic history of alcohol use and had last drink 1 week ago vs August.  Patient was also intermittently agitated hallucinating, voiding on the floor and pulling out Ivs. PCCM was consulted and deferred placement at this time.  03/04/2024 On assessment patient was unable to cooperate with exam due to being sedated on Ativan . Patient was resting comfortably in bed in no acute distress. Agree with the primary team with continuing with CIWA protocol and symptomatic triggered Ativan  to help address possible withdrawal symptoms.  Patient had a CIWA score of as high as 27.   Will continue to follow patient while admitted and will further assess patient as somnolence improves.  Will attempt to discuss further collateral with family members also and assessment of this patient.  Dispo location will be deferred until after a formal assessment.   03/05/2024 Patient seen laying in bed this morning on my approach. He was unable to participate with interview due to sedation.  Diagnoses:  Active Hospital problems: Principal Problem:   Hypotension Active Problems:   Graves disease    Plan  ## Safety and Observation Level:  - Based  on my clinical evaluation, I estimate the patient to be at low risk of self harm in the current setting - At this time, we recommend a routine level of observation. This decision is based on my review of the chart including patient's history and current presentation, interview of the patient, mental status examination, and consideration of suicide risk including evaluating suicidal ideation, plan, intent, suicidal or self-harm behaviors, risk factors, and protective factors. This judgment is based on our ability to directly address suicide risk, implement suicide prevention strategies and develop a safety plan while the patient is in the clinical setting. Please contact our team if there is a concern that risk level has changed.  ## Medications:  -- Continue with CIWA protocol as ordered per primary team with symptomatic triggered Ativan   -- Restart home psychotropic medications once tolerating PO -- Lexapro  20 mg daily  ## Medical Decision Making Capacity:  -- Not specifically assessed  ## Further Work-up:  -- Per primary team -- most recent EKG on 9/24 had QtC of 455 -- Pertinent labwork reviewed earlier this admission includes: CBC, CMP, Lipase, TSH, T4   ## Disposition:  -- TBD  ## Behavioral / Environmental:  -- Continue to monitor for withdrawals   ##Legal Status -- UTA   Thank you for this consult request. Recommendations have been communicated to the primary team.  We will follow at this time.   Casey LITTIE Glatter, DO   New History   Relevant Aspects of Hospital Course:  Admitted on 03/02/2024 for low blood pressure of unclear etiology. Per provider on 9/23:   In the ER,  hypotensive with SBP's in the 70's.  Creatinine elevated 4.14 above baseline.  Alert and oriented x 2.  Received 2L IVF with improvement of his BP.     Non-contrast CT head revealed generalized cerebral atrophy with chronic inferior cerebellar infarcts and chronic lacunar infarcts within the pons and basal  ganglia on the right.  No acute intracranial abnormality.     Per patient's family members at bedside, his wife and daughter, his mentation is at baseline.  He has had a decline in memory for years.  Patient Report:   Patient was assessed at bedside.  Upon approach patient was heavily sedated and undergoing an ultrasound.  Patient was unable to cooperate and engage in interview.  Patient was resting comfortably in bed and did not appear to be in acute distress.  Collateral information:  No one was bedside, will obtain later during admission  Psychiatric History:  03/04/2024 Unable to collect history from patient due to sedation and no one bedside for additional collateral.  Will continue to assess throughout admission  03/05/2024 Unable to collect history from patient due to sedation and no one bedside for additional collateral.  Will continue to assess throughout admission  Family psych history: UTA   Social History:  Tobacco use: current cigar and cigarette use per EMR  Alcohol use: chronic  Drug use: None reported in EMR   Family History:  The patient's family history includes Allergic rhinitis in his sister; Asthma in his grandson; Breast cancer in his mother; Diabetes in his mother; Hypertension in his mother; Leukemia in his father; Stroke in his mother.  Medical History: Past Medical History:  Diagnosis Date   Angio-edema    Anxiety    related to surgery   Asthma    GERD (gastroesophageal reflux disease)    History of blood transfusion    HTN (hypertension)    Seizures (HCC)    last seizure in 2023 per patient   Ulcer     Surgical History: Past Surgical History:  Procedure Laterality Date   CLOSED REDUCTION NASAL FRACTURE  11/19/2011   Procedure: CLOSED REDUCTION NASAL FRACTURE;  Surgeon: Marlyce Finer, MD;  Location: [facility] OR;  Service: ENT;  Laterality: N/A;  CLOSED REDUCTION NASAL AND NASAL SEPTAL FRACTURE WITH STABILIZATION   No surgical      Medications:    Current Facility-Administered Medications:    acetaminophen  (TYLENOL ) tablet 650 mg, 650 mg, Oral, Q6H PRN, Shona Terry SAILOR, DO, 650 mg at 03/04/24 1944   albuterol  (PROVENTIL ) (2.5 MG/3ML) 0.083% nebulizer solution 2.5 mg, 2.5 mg, Inhalation, Q6H PRN, Hall, Carole N, DO   aspirin  EC tablet 81 mg, 81 mg, Oral, Daily, Hall, Carole N, DO, 81 mg at 03/05/24 9171   atenolol  (TENORMIN ) tablet 50 mg, 50 mg, Oral, Daily, Kc, Ramesh, MD, 50 mg at 03/05/24 9171   dexamethasone  (DECADRON ) injection 4 mg, 4 mg, Intravenous, TID, Kc, Ramesh, MD, 4 mg at 03/05/24 9171   dextrose  5 %-0.9 % sodium chloride  infusion, , Intravenous, Continuous, Kc, Ramesh, MD, Last Rate: 75 mL/hr at 03/05/24 0540, New Bag at 03/05/24 0540   escitalopram  (LEXAPRO ) tablet 20 mg, 20 mg, Oral, Daily, Kc, Ramesh, MD, 20 mg at 03/05/24 0828   feeding supplement (ENSURE PLUS HIGH PROTEIN) liquid 237 mL, 237 mL, Oral, BID BM, Kc, Ramesh, MD, 237 mL at 03/05/24 0827   feeding supplement (JEVITY 1.5 CAL/FIBER) liquid 1,000 mL, 1,000 mL, Per Tube, Continuous, Drusilla, Sabas RAMAN, MD   folic acid  (FOLVITE ) tablet 1  mg, 1 mg, Oral, Daily, Kc, Ramesh, MD, 1 mg at 03/05/24 9171   free water  100 mL, 100 mL, Per Tube, Q4H, Drusilla, Sabas RAMAN, MD   heparin  injection 5,000 Units, 5,000 Units, Subcutaneous, Q8H, Shona Terry SAILOR, DO, 5,000 Units at 03/05/24 9396   LORazepam  (ATIVAN ) tablet 1-4 mg, 1-4 mg, Oral, Q1H PRN, 2 mg at 03/04/24 0913 **OR** LORazepam  (ATIVAN ) injection 1-4 mg, 1-4 mg, Intravenous, Q1H PRN, Christobal Guadalajara, MD, 4 mg at 03/05/24 0706   methimazole  (TAPAZOLE ) tablet 20 mg, 20 mg, Oral, Daily, Kc, Ramesh, MD, 20 mg at 03/05/24 9171   multivitamin with minerals tablet 1 tablet, 1 tablet, Oral, Daily, Shona Terry N, DO, 1 tablet at 03/05/24 0828   polyethylene glycol (MIRALAX  / GLYCOLAX ) packet 17 g, 17 g, Oral, Daily PRN, Hall, Carole N, DO   prochlorperazine  (COMPAZINE ) injection 5 mg, 5 mg, Intravenous, Q6H PRN, Hall, Carole N, DO   thiamine   (VITAMIN B1) 400 mg in sodium chloride  0.9 % 50 mL IVPB, 400 mg, Intravenous, Q8H, Kc, Ramesh, MD, Last Rate: 108 mL/hr at 03/05/24 0607, 400 mg at 03/05/24 0607   topiramate  (TOPAMAX ) tablet 100 mg, 100 mg, Oral, BID, Shona Terry N, DO, 100 mg at 03/05/24 9171  Allergies: Allergies  Allergen Reactions   Citrus Swelling   Fruit Extracts Swelling    Pt experiences swelling with most fruits   Chlorthalidone  Other (See Comments)    Hyponatremia       Objective  Vital signs:  Temp:  [97.8 F (36.6 C)-100.5 F (38.1 C)] 98.8 F (37.1 C) (09/26 0828) Pulse Rate:  [107-116] 107 (09/26 0828) Resp:  [20-40] 28 (09/26 0828) BP: (142-164)/(79-95) 151/87 (09/26 0828) SpO2:  [89 %-93 %] 93 % (09/26 0828) Weight:  [62.7 kg-65.1 kg] 62.7 kg (09/26 0703)  Psychiatric Specialty Exam:  Presentation  General Appearance: -- (Sedated)  Eye Contact:None  Speech:-- (Sedated, UTA)  Speech Volume:Other (comment) (Sedated, UTA)  Handedness:No data recorded  Mood and Affect  Mood:-- (Sedated, UTA)  Affect:-- (Sedated, UTA)   Thought Process  Thought Processes:Other (comment) (Sedated, UTA)  Descriptions of Associations:-- (Sedated, UTA)  Orientation:Other (comment) (Sedated, UTA)  Thought Content:Other (comment) (Sedated, UTA)  History of Schizophrenia/Schizoaffective disorder:No data recorded Duration of Psychotic Symptoms:No data recorded Hallucinations:No data recorded  Ideas of Reference:-- (Sedated, UTA)  Suicidal Thoughts:No data recorded  Homicidal Thoughts:No data recorded   Sensorium  Memory:Other (comment) (Sedated, UTA)  Judgment:Other (comment) (Sedated, UTA)  Insight:Other (comment) (Sedated, UTA)   Investment banker, operational (comment) (Sedated, UTA)  Attention Span:Other (comment) (Sedated, UTA)  Recall:Other (comment) (Sedated, UTA)  Fund of Knowledge:Other (comment) (Sedated, UTA)  Language:Other (comment) (Sedated,  UTA)   Psychomotor Activity  Psychomotor Activity:No data recorded   Assets  Assets:Other (comment) (Sedated, UTA)   Sleep  Sleep:No data recorded   Physical Exam: Physical Exam Stable on RA ROS UTA  Blood pressure (!) 151/87, pulse (!) 107, temperature 98.8 F (37.1 C), temperature source Oral, resp. rate (!) 28, height 5' 4 (1.626 m), weight 62.7 kg, SpO2 93%. Body mass index is 23.73 kg/m.

## 2024-03-05 NOTE — Progress Notes (Signed)
 [facility]  PROGRESS NOTE  Casey Warren FMW:[id number] DOB: 10-06-1960 DOA: 03/02/2024 PCP: Celestia Rosaline SQUIBB, NP   Brief HPI:    63 y.o. male with PMH of  hypertension, hyperlipidemia, chronic anxiety/depression, alcoholism, who presents to the ER, referred by his PCP due to low blood pressures of unclear etiology. Per family members at bedside he has cut down on alcohol consumption.  Last alcoholic drink was about a week ago. In the ER, hypotensive with SBP's in the 70's.  Creatinine elevated 4.14 above baseline.  Alert and oriented x 2.  Received 2L IVF with improvement of his BP.   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  No acute intracranial abnormality.He has had a decline in memory for years. CT chest abd pelvis:No acute findings in the chest, abdomen or pelvis. Scattered bilateral pulmonary nodules measuring up to 5 mm. Non-contrast chest CT can be considered in 12 months if patient is high-risk.    Assessment/Plan:    Acute encephalopathy/agitation/delirium Alcohol abuse, w/ withdrawals Recent cognitive decline Failure to thrive Severe malnutrition: last Etoh with wine bottle on 02/26/24 per daughter,normally heavy drinker-he had weaned himself from beer for a month. As per daughter for 2 and half month fatigued and for a month not eating well and has lost weight,having memory loss for 3-4 years forgetful/repetitive.He has not been himself recently.  Mentation worse from baseline past day or two. Psych consulted. Cont CIWA Ativan  for withdrawal, managing hyperthyroidism, continue supportive care Keep on strict fall precaution,  sitter if needed. Now on Posey. Start high dose thiamine .   Hyperthyroidism/concern for hypothyroid crisis Relative adrenal insufficiency w/ low cortisol: Tsh < 0.1, ft4 3.25 on add on, free T3 elevated 9.8 some (9/25) Picture  appears mixed with alcohol withdrawal and hyperthyroidism,  Cosyntropin  test reassuring  Thyroid -stimulating hemoglobin is elevated at 5.08 Given patient is seriously ill,-discussed with outpatient [facility] Endocrine Dr Mercie: US  thryoid is resutled- moderately heterogenous thyroid  gland- likely thyroiditis. Received PTU initially will change to methimazole  for easier dosing 20 daily>  TRAb is elevated at 8.45.  TS, will  need to go home on methimazole   Start atenolol  50 daily, taper off Decadron  over few days   Acute kidney injury Metabolic acidosis Hypo-kalemia Hyperphosphatemia: AKI in the setting of hypotension/poor oral intake.  Creatinine nicely improving with IV fluid hydration Continue IVF, monitor intake output avoid nephrotoxic medication   Hypernatremia -Sodium is 147 - Increase free water  to 200 every 6 hours - Follow serum sodium in a.m.   History of hypertension-PTA on amlodipine /valsartan /HCTZ: Question if from multiple antihypertensives,poor intake.Blood pressure stabilized following IV fluid resuscitation.  TSH suppressed cortisol low- see above No evidence of infection on UA and chest x-ray, blood culture NGTD Continue IV hydration.  Starting Ativan  as above   CVA hx: Incidentally noticed on CT head -old infarcts.  Continue home aspirin  and statin.   Ambulatory dysfunction Fall in the hospital: No acute trauma on the CT head 9/25 am.Keep on for precaution sitter/restraint if needed PT OT eval completed as advised skilled nursing facility once stable   Chronic anxiety/depression Cont  home regimen.  Psych seeing the patient as well   Lung nodules: Fu in 12 m w/ CT chest if high risk   Medications     aspirin  EC  81 mg Oral Daily   atenolol   50 mg Oral Daily   dexamethasone  (DECADRON ) injection  4 mg Intravenous TID   escitalopram   20 mg Oral Daily   feeding supplement  237 mL Oral BID BM   feeding supplement (PROSource TF20)  60 mL Per Tube Daily    folic acid   1 mg Oral Daily   free water   100 mL Per Tube Q4H   heparin   5,000 Units Subcutaneous Q8H   methIMAzole   20 mg Oral Daily   multivitamin with minerals  1 tablet Oral Daily   topiramate   100 mg Oral BID     Data Reviewed:   CBG:  Recent Labs  Lab 03/02/24 1602  GLUCAP 155*    SpO2: 90 %    Vitals:   03/05/24 0109 03/05/24 0703 03/05/24 0828 03/05/24 1235  BP: (!) 142/89  (!) 151/87 (!) 152/81  Pulse: (!) 110  (!) 107 98  Resp: (!) 40  (!) 28 (!) 21  Temp: 99.6 F (37.6 C)  98.8 F (37.1 C) 98.4 F (36.9 C)  TempSrc: Oral  Oral Axillary  SpO2: 91%  93% 90%  Weight:  62.7 kg    Height:          Data Reviewed:  Basic Metabolic Panel: Recent Labs  Lab 03/02/24 1634 03/02/24 2108 03/03/24 0451 03/03/24 1603 03/04/24 0640 03/05/24 0237  NA 138  --  139 142 144 147*  K 4.5  --  3.3* 4.2 4.0 4.0  CL 100  --  110 113* 113* 115*  CO2 19*  --  19* 16* 17* 17*  GLUCOSE 139*  --  114* 113* 128* 165*  BUN 105*  --  86* 71* 56* 48*  CREATININE 4.14* 3.80* 2.63* 1.97* 1.59* 1.53*  CALCIUM  9.7  --  8.5* 9.4 9.6 9.5  MG  --   --  2.0  --   --   --   PHOS  --   --  5.0*  --   --   --     CBC: Recent Labs  Lab 03/02/24 1634 03/02/24 2108 03/03/24 0451 03/04/24 0640 03/05/24 0237  WBC 6.2 6.0 5.3 6.2 8.1  NEUTROABS 4.5  --   --   --   --   HGB 14.2 13.1 11.1* 12.3* 11.9*  HCT 41.5 39.8 32.6* 36.4* 35.1*  MCV 88.3 91.5 88.6 88.3 88.0  PLT 276 256 211 254 258    LFT Recent Labs  Lab 03/02/24 1634  AST 39  ALT 56*  ALKPHOS 93  BILITOT 1.6*  PROT 7.1  ALBUMIN 3.4*     Antibiotics: Anti-infectives (From admission, onward)    None        DVT prophylaxis: Heparin   Code Status: Full code  Family Communication: No family at bedside   CONSULTS    Subjective   No new complaints, continues to be lethargic.   Objective    Physical Examination:   Barely opens eyes to sternal rub S1-S2, regular, no murmur  auscultated Abdomen is soft, nontender abdomen Extremities no edema  Status is: Inpatient:             Casey Warren Brod   [facility] If 7PM-7AM, please contact night-coverage at [email or web address], Office  [phone]   03/05/2024, 1:13 PM  LOS: 3 days

## 2024-03-06 ENCOUNTER — Inpatient Hospital Stay (HOSPITAL_COMMUNITY)

## 2024-03-06 DIAGNOSIS — E0500 Thyrotoxicosis with diffuse goiter without thyrotoxic crisis or storm: Secondary | ICD-10-CM | POA: Diagnosis not present

## 2024-03-06 DIAGNOSIS — I959 Hypotension, unspecified: Secondary | ICD-10-CM | POA: Diagnosis not present

## 2024-03-06 DIAGNOSIS — N179 Acute kidney failure, unspecified: Secondary | ICD-10-CM | POA: Diagnosis not present

## 2024-03-06 LAB — COMPREHENSIVE METABOLIC PANEL WITH GFR
ALT: 75 U/L — ABNORMAL HIGH (ref 0–44)
AST: 39 U/L (ref 15–41)
Albumin: 2.8 g/dL — ABNORMAL LOW (ref 3.5–5.0)
Alkaline Phosphatase: 107 U/L (ref 38–126)
Anion gap: 15 (ref 5–15)
BUN: 35 mg/dL — ABNORMAL HIGH (ref 8–23)
CO2: 17 mmol/L — ABNORMAL LOW (ref 22–32)
Calcium: 9.3 mg/dL (ref 8.9–10.3)
Chloride: 119 mmol/L — ABNORMAL HIGH (ref 98–111)
Creatinine, Ser: 1.35 mg/dL — ABNORMAL HIGH (ref 0.61–1.24)
GFR, Estimated: 58 mL/min — ABNORMAL LOW (ref >60)
Glucose, Bld: 170 mg/dL — ABNORMAL HIGH (ref 70–99)
Potassium: 3.4 mmol/L — ABNORMAL LOW (ref 3.5–5.1)
Sodium: 151 mmol/L — ABNORMAL HIGH (ref 135–145)
Total Bilirubin: 0.6 mg/dL (ref 0.0–1.2)
Total Protein: 6.3 g/dL — ABNORMAL LOW (ref 6.5–8.1)

## 2024-03-06 LAB — MAGNESIUM: Magnesium: 1.4 mg/dL — ABNORMAL LOW (ref 1.7–2.4)

## 2024-03-06 LAB — URINALYSIS, ROUTINE W REFLEX MICROSCOPIC
Bilirubin Urine: NEGATIVE
Glucose, UA: NEGATIVE mg/dL
Hgb urine dipstick: NEGATIVE
Ketones, ur: NEGATIVE mg/dL
Leukocytes,Ua: NEGATIVE
Nitrite: NEGATIVE
Protein, ur: NEGATIVE mg/dL
Specific Gravity, Urine: 1.014 (ref 1.005–1.030)
pH: 8 (ref 5.0–8.0)

## 2024-03-06 LAB — CBC
HCT: 36.3 % — ABNORMAL LOW (ref 39.0–52.0)
Hemoglobin: 11.9 g/dL — ABNORMAL LOW (ref 13.0–17.0)
MCH: 29.1 pg (ref 26.0–34.0)
MCHC: 32.8 g/dL (ref 30.0–36.0)
MCV: 88.8 fL (ref 80.0–100.0)
Platelets: 245 K/uL (ref 150–400)
RBC: 4.09 MIL/uL — ABNORMAL LOW (ref 4.22–5.81)
RDW: 13 % (ref 11.5–15.5)
WBC: 12.8 K/uL — ABNORMAL HIGH (ref 4.0–10.5)
nRBC: 0 % (ref 0.0–0.2)

## 2024-03-06 LAB — PHOSPHORUS: Phosphorus: 3.7 mg/dL (ref 2.5–4.6)

## 2024-03-06 LAB — LACTIC ACID, PLASMA: Lactic Acid, Venous: 1.2 mmol/L (ref 0.5–1.9)

## 2024-03-06 LAB — GLUCOSE, CAPILLARY
Glucose-Capillary: 161 mg/dL — ABNORMAL HIGH (ref 70–99)
Glucose-Capillary: 174 mg/dL — ABNORMAL HIGH (ref 70–99)

## 2024-03-06 LAB — VITAMIN B12: Vitamin B-12: 337 pg/mL (ref 180–914)

## 2024-03-06 LAB — AMMONIA: Ammonia: 45 umol/L — ABNORMAL HIGH (ref 9–35)

## 2024-03-06 MED ORDER — LORAZEPAM 2 MG/ML IJ SOLN
1.0000 mg | Freq: Once | INTRAMUSCULAR | Status: AC
  Administered 2024-03-06: 1 mg via INTRAVENOUS
  Filled 2024-03-06: qty 1

## 2024-03-06 MED ORDER — ORAL CARE MOUTH RINSE: 15.0000 mL | OROMUCOSAL | Status: DC | PRN

## 2024-03-06 MED ORDER — FREE WATER
200.0000 mL | Status: DC
  Administered 2024-03-06 – 2024-03-08 (×10): 200 mL

## 2024-03-06 MED ORDER — ORAL CARE MOUTH RINSE
15.0000 mL | OROMUCOSAL | Status: DC
Start: 2024-03-06 — End: 2024-03-16
  Administered 2024-03-06 – 2024-03-16 (×38): 15 mL via OROMUCOSAL

## 2024-03-06 MED ORDER — MAGNESIUM SULFATE 2 GM/50ML IV SOLN
2.0000 g | Freq: Once | INTRAVENOUS | Status: AC
  Administered 2024-03-06: 2 g via INTRAVENOUS
  Filled 2024-03-06: qty 50

## 2024-03-06 NOTE — Progress Notes (Addendum)
 [facility]  PROGRESS NOTE  Casey Warren FMW:[id number] DOB: Sep 10, 1960 DOA: 03/02/2024 PCP: Celestia Rosaline SQUIBB, NP   Brief HPI:    63 y.o. male with PMH of  hypertension, hyperlipidemia, chronic anxiety/depression, alcoholism, who presents to the ER, referred by his PCP due to low blood pressures of unclear etiology. Per family members at bedside he has cut down on alcohol consumption.  Last alcoholic drink was about a week ago. In the ER, hypotensive with SBP's in the 70's.  Creatinine elevated 4.14 above baseline.  Alert and oriented x 2.  Received 2L IVF with improvement of his BP.   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  No acute intracranial abnormality.He has had a decline in memory for years. CT chest abd pelvis:No acute findings in the chest, abdomen or pelvis. Scattered bilateral pulmonary nodules measuring up to 5 mm. Non-contrast chest CT can be considered in 12 months if patient is high-risk.    Assessment/Plan:    Acute encephalopathy/agitation/delirium Alcohol abuse, w/ withdrawals Recent cognitive decline Failure to thrive Severe malnutrition: last Etoh with wine bottle on 02/26/24 per daughter,normally heavy drinker-he had weaned himself from beer for a month. As per daughter for 2 and half month fatigued and for a month not eating well and has lost weight,having memory loss for 3-4 years forgetful/repetitive.He has not been himself recently.  Mentation worse from baseline past day or two. Psych consulted. Cont CIWA Ativan  for withdrawal, managing hyperthyroidism, continue supportive care Keep on strict fall precaution,  sitter if needed. Now on Posey. Completed high-dose thiamine , currently on thiamine  injection 100 mg IV every 8 hours -Discussed with neurology, Dr. Hillman.  Will obtain MRI brain, EEG B12 level and ammonia level to complete the  workup for encephalopathy   Hyperthyroidism/concern for hypothyroid crisis Relative adrenal insufficiency w/ low cortisol: Tsh < 0.1, ft4 3.25 on add on, free T3 elevated 9.8 some (9/25) Picture appears mixed with alcohol withdrawal and hyperthyroidism,  Cosyntropin  test reassuring  Thyroid -stimulating hemoglobin is elevated at 5.08 Given patient is seriously ill,-discussed with outpatient [facility] Endocrine Dr Mercie: US  thryoid is resutled- moderately heterogenous thyroid  gland- likely thyroiditis. Received PTU initially will change to methimazole  for easier dosing 20 daily>  TRAb is elevated at 8.45.  TS, will  need to go home on methimazole   Started atenolol  50 daily, taper off Decadron  over few days  SIRS -Patient tachypneic, tachycardic, low-grade fever -Will obtain chest x-ray, blood cultures x 2, UA -Check lactic acid   Acute kidney injury Metabolic acidosis Hypo-kalemia Hyperphosphatemia: AKI in the setting of hypotension/poor oral intake.  Creatinine nicely improving with IV fluid hydration Continue IVF, monitor intake output avoid nephrotoxic medication   Hypernatremia -Sodium is 151 - Increase free water  to 200 every 4 hours - Follow serum sodium in a.m.  Hypomagnesemia - Serum magnesium 1.4 - Will replace magnesium and follow mag level in a.m.      CVA hx: Incidentally noticed on CT head -old infarcts.  Continue home aspirin  and statin.   Ambulatory dysfunction Fall in the hospital: No acute trauma on the CT head 9/25 am.Keep on for precaution sitter/restraint if needed PT OT eval completed as advised skilled nursing facility once stable   Chronic anxiety/depression Cont  home regimen.  Psych seeing the patient as well   Lung nodules: Fu in 12 m w/ CT chest if high risk   Medications  aspirin   81 mg Per Tube Daily   atenolol   50 mg Per Tube Daily   dexamethasone  (DECADRON ) injection  4 mg Intravenous TID   escitalopram   20 mg Per Tube Daily    feeding supplement  237 mL Oral BID BM   feeding supplement (PROSource TF20)  60 mL Per Tube Daily   folic acid   1 mg Per Tube Daily   free water   200 mL Per Tube Q6H   heparin   5,000 Units Subcutaneous Q8H   methIMAzole   20 mg Per Tube Daily   multivitamin with minerals  1 tablet Per Tube Daily   [START ON 03/07/2024] thiamine  (VITAMIN B1) injection  100 mg Intravenous Q8H   topiramate   100 mg Per Tube BID     Data Reviewed:   CBG:  Recent Labs  Lab 03/02/24 1602  GLUCAP 155*    SpO2: 92 %    Vitals:   03/05/24 2000 03/05/24 2326 03/06/24 0304 03/06/24 0400  BP: (!) 159/90 (!) 166/98 (!) 165/97 (!) 161/89  Pulse: (!) 105 (!) 106 (!) 110 (!) 105  Resp: (!) 24 (!) 26 (!) 27 (!) 27  Temp:  99 F (37.2 C) 99.3 F (37.4 C)   TempSrc:  Oral Oral   SpO2:  90% 93% 92%  Weight:      Height:          Data Reviewed:  Basic Metabolic Panel: Recent Labs  Lab 03/03/24 0451 03/03/24 1603 03/04/24 0640 03/05/24 0237 03/06/24 0615  NA 139 142 144 147* 151*  K 3.3* 4.2 4.0 4.0 3.4*  CL 110 113* 113* 115* 119*  CO2 19* 16* 17* 17* 17*  GLUCOSE 114* 113* 128* 165* 170*  BUN 86* 71* 56* 48* 35*  CREATININE 2.63* 1.97* 1.59* 1.53* 1.35*  CALCIUM  8.5* 9.4 9.6 9.5 9.3  MG 2.0  --   --   --  1.4*  PHOS 5.0*  --   --   --  3.7    CBC: Recent Labs  Lab 03/02/24 1634 03/02/24 2108 03/03/24 0451 03/04/24 0640 03/05/24 0237 03/06/24 0615  WBC 6.2 6.0 5.3 6.2 8.1 12.8*  NEUTROABS 4.5  --   --   --   --   --   HGB 14.2 13.1 11.1* 12.3* 11.9* 11.9*  HCT 41.5 39.8 32.6* 36.4* 35.1* 36.3*  MCV 88.3 91.5 88.6 88.3 88.0 88.8  PLT 276 256 211 254 258 245    LFT Recent Labs  Lab 03/02/24 1634 03/06/24 0615  AST 39 39  ALT 56* 75*  ALKPHOS 93 107  BILITOT 1.6* 0.6  PROT 7.1 6.3*  ALBUMIN 3.4* 2.8*     Antibiotics: Anti-infectives (From admission, onward)    None        DVT prophylaxis: Heparin   Code Status: Full code  Family Communication: Discussed  with granddaughter at bedside   CONSULTS    Subjective   Patient became more agitated today.  Found to be tachycardic, tachypneic.   Objective    Physical Examination:  Appears in mild distress S1-S2, regular Lungs clear to auscultation bilaterally Abdomen is soft, nontender, no organomegaly Extremities no edema   Status is: Inpatient:             Casey Warren   [facility] If 7PM-7AM, please contact night-coverage at [email or web address], Office  [phone]   03/06/2024, 8:29 AM  LOS: 4 days

## 2024-03-06 NOTE — Progress Notes (Signed)
 Routine EEG completed, results pending Neurology review and interpretation

## 2024-03-07 ENCOUNTER — Inpatient Hospital Stay (HOSPITAL_COMMUNITY)

## 2024-03-07 DIAGNOSIS — I639 Cerebral infarction, unspecified: Secondary | ICD-10-CM | POA: Diagnosis not present

## 2024-03-07 DIAGNOSIS — N179 Acute kidney failure, unspecified: Secondary | ICD-10-CM | POA: Diagnosis not present

## 2024-03-07 DIAGNOSIS — R569 Unspecified convulsions: Secondary | ICD-10-CM

## 2024-03-07 DIAGNOSIS — I6389 Other cerebral infarction: Secondary | ICD-10-CM | POA: Diagnosis not present

## 2024-03-07 DIAGNOSIS — E0500 Thyrotoxicosis with diffuse goiter without thyrotoxic crisis or storm: Secondary | ICD-10-CM | POA: Diagnosis not present

## 2024-03-07 DIAGNOSIS — E440 Moderate protein-calorie malnutrition: Secondary | ICD-10-CM | POA: Insufficient documentation

## 2024-03-07 DIAGNOSIS — I6350 Cerebral infarction due to unspecified occlusion or stenosis of unspecified cerebral artery: Secondary | ICD-10-CM | POA: Diagnosis not present

## 2024-03-07 DIAGNOSIS — I959 Hypotension, unspecified: Secondary | ICD-10-CM | POA: Diagnosis not present

## 2024-03-07 LAB — CBC
HCT: 38.9 % — ABNORMAL LOW (ref 39.0–52.0)
Hemoglobin: 13.2 g/dL (ref 13.0–17.0)
MCH: 29.9 pg (ref 26.0–34.0)
MCHC: 33.9 g/dL (ref 30.0–36.0)
MCV: 88 fL (ref 80.0–100.0)
Platelets: 227 K/uL (ref 150–400)
RBC: 4.42 MIL/uL (ref 4.22–5.81)
RDW: 13 % (ref 11.5–15.5)
WBC: 12.7 K/uL — ABNORMAL HIGH (ref 4.0–10.5)
nRBC: 0 % (ref 0.0–0.2)

## 2024-03-07 LAB — CULTURE, BLOOD (ROUTINE X 2)
Culture: NO GROWTH
Culture: NO GROWTH
Special Requests: ADEQUATE

## 2024-03-07 LAB — BASIC METABOLIC PANEL WITH GFR
Anion gap: 10 (ref 5–15)
BUN: 32 mg/dL — ABNORMAL HIGH (ref 8–23)
CO2: 17 mmol/L — ABNORMAL LOW (ref 22–32)
Calcium: 8.5 mg/dL — ABNORMAL LOW (ref 8.9–10.3)
Chloride: 113 mmol/L — ABNORMAL HIGH (ref 98–111)
Creatinine, Ser: 1.14 mg/dL (ref 0.61–1.24)
GFR, Estimated: >60 mL/min (ref >60)
Glucose, Bld: 167 mg/dL — ABNORMAL HIGH (ref 70–99)
Potassium: 3.3 mmol/L — ABNORMAL LOW (ref 3.5–5.1)
Sodium: 140 mmol/L (ref 135–145)

## 2024-03-07 LAB — MAGNESIUM: Magnesium: 1.9 mg/dL (ref 1.7–2.4)

## 2024-03-07 LAB — GLUCOSE, CAPILLARY
Glucose-Capillary: 163 mg/dL — ABNORMAL HIGH (ref 70–99)
Glucose-Capillary: 128 mg/dL — ABNORMAL HIGH (ref 70–99)
Glucose-Capillary: 164 mg/dL — ABNORMAL HIGH (ref 70–99)

## 2024-03-07 LAB — PHOSPHORUS: Phosphorus: 3.6 mg/dL (ref 2.5–4.6)

## 2024-03-07 LAB — POTASSIUM: Potassium: 3.4 mmol/L — ABNORMAL LOW (ref 3.5–5.1)

## 2024-03-07 MED ORDER — CEFTRIAXONE SODIUM 1 G IJ SOLR
1.0000 g | Freq: Once | INTRAMUSCULAR | Status: AC
  Administered 2024-03-07: 1 g via INTRAVENOUS
  Filled 2024-03-07: qty 10

## 2024-03-07 MED ORDER — HALOPERIDOL LACTATE 5 MG/ML IJ SOLN
2.0000 mg | Freq: Every day | INTRAMUSCULAR | Status: DC | PRN
  Administered 2024-03-07 – 2024-03-10 (×2): 2 mg via INTRAMUSCULAR
  Filled 2024-03-07 (×2): qty 1

## 2024-03-07 MED ORDER — LORAZEPAM 2 MG/ML IJ SOLN
2.0000 mg | Freq: Once | INTRAMUSCULAR | Status: AC
  Administered 2024-03-07: 2 mg via INTRAMUSCULAR
  Filled 2024-03-07: qty 1

## 2024-03-07 MED ORDER — NICOTINE 21 MG/24HR TD PT24
21.0000 mg | MEDICATED_PATCH | Freq: Every day | TRANSDERMAL | Status: DC
  Administered 2024-03-07 – 2024-03-16 (×10): 21 mg via TRANSDERMAL
  Filled 2024-03-07 (×10): qty 1

## 2024-03-07 MED ORDER — SODIUM CHLORIDE 0.9 % IV SOLN
1.0000 g | INTRAVENOUS | Status: DC
  Administered 2024-03-07: 1 g via INTRAVENOUS
  Filled 2024-03-07: qty 10

## 2024-03-07 MED ORDER — POTASSIUM CHLORIDE 20 MEQ PO PACK
40.0000 meq | PACK | Freq: Once | ORAL | Status: AC
  Administered 2024-03-07: 40 meq
  Filled 2024-03-07: qty 2

## 2024-03-07 MED ORDER — AZITHROMYCIN 500 MG IV SOLR
500.0000 mg | INTRAVENOUS | Status: DC
  Administered 2024-03-07 – 2024-03-10 (×4): 500 mg via INTRAVENOUS
  Filled 2024-03-07 (×5): qty 5

## 2024-03-07 MED ORDER — DEXAMETHASONE SODIUM PHOSPHATE 4 MG/ML IJ SOLN
4.0000 mg | Freq: Two times a day (BID) | INTRAMUSCULAR | Status: DC
  Administered 2024-03-07 – 2024-03-08 (×3): 4 mg via INTRAVENOUS
  Filled 2024-03-07 (×3): qty 1

## 2024-03-07 MED ORDER — HALOPERIDOL 1 MG PO TABS: 2.0000 mg | ORAL_TABLET | Freq: Every day | ORAL | Status: DC | PRN

## 2024-03-07 MED ORDER — LORAZEPAM 1 MG PO TABS
1.0000 mg | ORAL_TABLET | Freq: Every day | ORAL | Status: DC | PRN
  Administered 2024-03-09 – 2024-03-11 (×3): 1 mg via ORAL
  Filled 2024-03-07 (×3): qty 1

## 2024-03-07 MED ORDER — SODIUM CHLORIDE 0.9 % IV SOLN
2.0000 g | INTRAVENOUS | Status: DC
  Administered 2024-03-08 – 2024-03-11 (×4): 2 g via INTRAVENOUS
  Filled 2024-03-07 (×4): qty 20

## 2024-03-07 MED ORDER — LORAZEPAM 2 MG/ML IJ SOLN
1.0000 mg | Freq: Every day | INTRAMUSCULAR | Status: DC | PRN
  Administered 2024-03-07: 1 mg via INTRAVENOUS
  Filled 2024-03-07: qty 1

## 2024-03-07 MED ORDER — IOHEXOL 350 MG/ML SOLN
75.0000 mL | Freq: Once | INTRAVENOUS | Status: AC | PRN
Start: 2024-03-07 — End: 2024-03-07
  Administered 2024-03-07: 75 mL via INTRAVENOUS

## 2024-03-07 NOTE — Progress Notes (Signed)
 [facility]  PROGRESS NOTE  Casey Warren FMW:[id number] DOB: 07/18/1960 DOA: 03/02/2024 PCP: Celestia Rosaline SQUIBB, NP   Brief HPI:    63 y.o. male with PMH of  hypertension, hyperlipidemia, chronic anxiety/depression, alcoholism, who presents to the ER, referred by his PCP due to low blood pressures of unclear etiology. Per family members at bedside he has cut down on alcohol consumption.  Last alcoholic drink was about a week ago. In the ER, hypotensive with SBP's in the 70's.  Creatinine elevated 4.14 above baseline.  Alert and oriented x 2.  Received 2L IVF with improvement of his BP.   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  No acute intracranial abnormality.He has had a decline in memory for years. CT chest abd pelvis:No acute findings in the chest, abdomen or pelvis. Scattered bilateral pulmonary nodules measuring up to 5 mm. Non-contrast chest CT can be considered in 12 months if patient is high-risk.    Assessment/Plan:    Acute encephalopathy/agitation/delirium Alcohol abuse, w/ withdrawals Recent cognitive decline Failure to thrive Severe malnutrition: last Etoh with wine bottle on 02/26/24 per daughter,normally heavy drinker-he had weaned himself from beer for a month. As per daughter for 2 and half month fatigued and for a month not eating well and has lost weight,having memory loss for 3-4 years forgetful/repetitive.He has not been himself recently.  Mentation worse from baseline past day or two. Psych consulted. Cont CIWA Ativan  for withdrawal, managing hyperthyroidism, continue supportive care Keep on strict fall precaution,  sitter if needed. Now on Posey. Completed high-dose thiamine , currently on thiamine  injection 100 mg IV every 8 hours -Discussed with neurology, Dr. Hillman.  Will obtain MRI brain, EEG B12 level and ammonia level to complete the  workup for encephalopathy  Acute infarct -MRI brain obtained this morning showed acute infarcts in the right basal ganglia, left corona radiator and left medial temporal lobe. -Will keep n.p.o., patient is getting tube feeds via core track feeding tube -Will consult neurology   Hyperthyroidism/concern for hypothyroid crisis Relative adrenal insufficiency w/ low cortisol: Tsh < 0.1, ft4 3.25 on add on, free T3 elevated 9.8 some (9/25) Picture appears mixed with alcohol withdrawal and hyperthyroidism,  Cosyntropin  test reassuring  Thyroid -stimulating hemoglobin is elevated at 5.08 Given patient is seriously ill,-discussed with outpatient [facility] Endocrine Dr Mercie: US  thryoid is resutled- moderately heterogenous thyroid  gland- likely thyroiditis. Received PTU initially will change to methimazole  for easier dosing 20 daily>  TRAb is elevated at 8.45.  TS, will  need to go home on methimazole   Started atenolol  50 daily, taper off Decadron  over few days  SIRS -Patient tachypneic, tachycardic, low-grade fever -Chest x-ray showed multifocal pneumonia versus pulmonary edema -Started on Rocephin and Zithromax -Follow blood culture results -UA was clear - Lactic acid 1.2   Acute kidney injury -Resolved  Hypernatremia -Sodium is 151 - Increased free water  to 200 every 4 hours - Follow serum sodium in a.m.  Hypomagnesemia - Replete  CVA hx: Incidentally noticed on CT head -old infarcts.  Continue home aspirin  and statin.   Ambulatory dysfunction Fall in the hospital: No acute trauma on the CT head 9/25 am.Keep on for precaution sitter/restraint if needed PT OT eval completed as advised skilled nursing facility once stable   Chronic anxiety/depression Cont  home regimen.  Psych seeing the patient as well   Lung nodules: Fu in 12 m w/ CT chest  if high risk   Medications     aspirin   81 mg Per Tube Daily   atenolol   50 mg Per Tube Daily   dexamethasone  (DECADRON )  injection  4 mg Intravenous Q12H   escitalopram   20 mg Per Tube Daily   feeding supplement  237 mL Oral BID BM   feeding supplement (PROSource TF20)  60 mL Per Tube Daily   folic acid   1 mg Per Tube Daily   free water   200 mL Per Tube Q4H   heparin   5,000 Units Subcutaneous Q8H   methIMAzole   20 mg Per Tube Daily   multivitamin with minerals  1 tablet Per Tube Daily   mouth rinse  15 mL Mouth Rinse 4 times per day   thiamine  (VITAMIN B1) injection  100 mg Intravenous Q8H   topiramate   100 mg Per Tube BID     Data Reviewed:   CBG:  Recent Labs  Lab 03/02/24 1602 03/06/24 1705 03/06/24 2327 03/07/24 0551 03/07/24 1215  GLUCAP 155* 161* 174* 163* 128*    SpO2: 100 %    Vitals:   03/07/24 0705 03/07/24 0745 03/07/24 0912 03/07/24 1215  BP:  (!) 169/67 (!) 163/89 (!) 164/92  Pulse:  97 (!) 112 93  Resp:  (!) 22  (!) 21  Temp:  99 F (37.2 C)  98.8 F (37.1 C)  TempSrc:  Oral  Axillary  SpO2:  91%  100%  Weight: 66.9 kg     Height:          Data Reviewed:  Basic Metabolic Panel: Recent Labs  Lab 03/03/24 0451 03/03/24 1603 03/04/24 0640 03/05/24 0237 03/06/24 0615 03/07/24 0606  NA 139 142 144 147* 151*  --   K 3.3* 4.2 4.0 4.0 3.4* 3.4*  CL 110 113* 113* 115* 119*  --   CO2 19* 16* 17* 17* 17*  --   GLUCOSE 114* 113* 128* 165* 170*  --   BUN 86* 71* 56* 48* 35*  --   CREATININE 2.63* 1.97* 1.59* 1.53* 1.35*  --   CALCIUM  8.5* 9.4 9.6 9.5 9.3  --   MG 2.0  --   --   --  1.4* 1.9  PHOS 5.0*  --   --   --  3.7 3.6    CBC: Recent Labs  Lab 03/02/24 1634 03/02/24 2108 03/03/24 0451 03/04/24 0640 03/05/24 0237 03/06/24 0615 03/07/24 0606  WBC 6.2   < > 5.3 6.2 8.1 12.8* 12.7*  NEUTROABS 4.5  --   --   --   --   --   --   HGB 14.2   < > 11.1* 12.3* 11.9* 11.9* 13.2  HCT 41.5   < > 32.6* 36.4* 35.1* 36.3* 38.9*  MCV 88.3   < > 88.6 88.3 88.0 88.8 88.0  PLT 276   < > 211 254 258 245 227   < > = values in this interval not displayed.     LFT Recent Labs  Lab 03/02/24 1634 03/06/24 0615  AST 39 39  ALT 56* 75*  ALKPHOS 93 107  BILITOT 1.6* 0.6  PROT 7.1 6.3*  ALBUMIN 3.4* 2.8*     Antibiotics: Anti-infectives (From admission, onward)    Start     Dose/Rate Route Frequency Ordered Stop   03/08/24 0900  cefTRIAXone (ROCEPHIN) 2 g in sodium chloride  0.9 % 100 mL IVPB        2 g 200 mL/hr over 30 Minutes Intravenous Every  24 hours 03/07/24 1004     03/07/24 1100  cefTRIAXone (ROCEPHIN) 1 g in sodium chloride  0.9 % 100 mL IVPB        1 g 200 mL/hr over 30 Minutes Intravenous  Once 03/07/24 1005     03/07/24 1000  azithromycin (ZITHROMAX) 500 mg in sodium chloride  0.9 % 250 mL IVPB        500 mg 250 mL/hr over 60 Minutes Intravenous Every 24 hours 03/07/24 0813     03/07/24 0900  cefTRIAXone (ROCEPHIN) 1 g in sodium chloride  0.9 % 100 mL IVPB  Status:  Discontinued        1 g 200 mL/hr over 30 Minutes Intravenous Every 24 hours 03/07/24 0813 03/07/24 1004        DVT prophylaxis: Heparin   Code Status: Full code  Family Communication: Discussed with granddaughter at bedside   CONSULTS    Subjective   Patient became more agitated today.  Found to be tachycardic, tachypneic.   Objective    Physical Examination:  Appears in mild distress S1-S2, regular Lungs clear to auscultation bilaterally Abdomen is soft, nontender, no organomegaly Extremities no edema   Status is: Inpatient:             Sabas GORMAN Brod   [facility] If 7PM-7AM, please contact night-coverage at [email or web address], Office  [phone]   03/07/2024, 2:10 PM  LOS: 5 days

## 2024-03-07 NOTE — Procedures (Signed)
 Routine EEG Report  Casey Warren is a 63 y.o. male with a history of seizure who is undergoing an EEG to evaluate for seizures.  Report: This EEG was acquired with electrodes placed according to the International 10-20 electrode system (including Fp1, Fp2, F3, F4, C3, C4, P3, P4, O1, O2, T3, T4, T5, T6, A1, A2, Fz, Cz, Pz). The following electrodes were missing or displaced: none.  The occipital dominant rhythm was 9 Hz. This activity is reactive to stimulation. Drowsiness was manifested by background fragmentation; deeper stages of sleep were identified by K complexes and sleep spindles. There was bitemporal focal slowing. There were rare sharp waves over the left temporal region. There were no electrographic seizures identified. There was no abnormal response to photic stimulation or hyperventilation.   Impression and clinical correlation: This EEG was obtained while awake and asleep and is abnormal due to: - Focal bitemporal slowing indicative of focal cerebral dysfunction in those regions - Rare left temporal sharp waves indicative of increased epileptogenic potential in those regions  Elida Ross, MD [facility] [phone]  If 7pm- 7am, please page neurology on call as listed in AMION.

## 2024-03-07 NOTE — Consult Note (Signed)
 NEUROLOGY CONSULT NOTE   Date of service: March 07, 2024 Patient Name: Casey Warren MRN:  [id number] DOB:  04-29-1961 Chief Complaint: weakness Requesting Provider: Drusilla Sabas RAMAN, MD  History of Present Illness  Casey Warren is a 63 y.o. male with hx of prior CVA, HTN, HLD, etoh abuse, anxiety/depression who presented to ED after being referred by PCP d/t hypotension with unknown etiology.   On admission to ED, SBP was in the 70s, Cr 4.14. He was bolused with 2L with subsequent improvement in BP. CXR concerning for pneumonia versus pulmonary edema.   Patient with continued worsening mental status, neurology consulted. MRI brain shows acute infarcts.   On exam today, RN and wife are at bedside. They both endorse improved responsiveness and mentation from admission. He is awake, oriented to place, month, year, age. He does have mild aphasia and dysarthria, which wife said he had since previous stroke. No focal weakness. Endorses reduced sensation to right arm and leg. He has poor attention and his aphasia and confusion seems to increase with further conversation and more complicated topics. He follows simple commands with no issue.   Wife states that patient has not been eating well at all at home since he stopped drinking. He stopped drinking beer about a month ago, then stopped drink about 2 weeks ago. She also stated that he has had trouble swallowing, but was always good about taking all of his medications.    ROS  Comprehensive ROS performed with patient and wife at bedside  and pertinent positives documented in HPI   Past History   Past Medical History:  Diagnosis Date   Angio-edema    Anxiety    related to surgery   Asthma    GERD (gastroesophageal reflux disease)    History of blood transfusion    HTN (hypertension)    Seizures (HCC)    last seizure in 2023 per patient   Ulcer     Past Surgical History:  Procedure Laterality Date   CLOSED REDUCTION NASAL  FRACTURE  11/19/2011   Procedure: CLOSED REDUCTION NASAL FRACTURE;  Surgeon: Marlyce Finer, MD;  Location: [facility] OR;  Service: ENT;  Laterality: N/A;  CLOSED REDUCTION NASAL AND NASAL SEPTAL FRACTURE WITH STABILIZATION   No surgical      Family History: Family History  Problem Relation Age of Onset   Stroke Mother        Died age 68   Hypertension Mother    Diabetes Mother    Breast cancer Mother    Leukemia Father        Died age 27   Allergic rhinitis Sister    Asthma Grandson    Colon cancer Neg Hx     Social History  reports that he has been smoking cigars and cigarettes. He has never been exposed to tobacco smoke. He has never used smokeless tobacco. He reports that he does not currently use alcohol. He reports that he does not use drugs.  Allergies  Allergen Reactions   Citrus Swelling   Fruit Extracts Swelling    Pt experiences swelling with most fruits   Chlorthalidone  Other (See Comments)    Hyponatremia    Medications   Current Facility-Administered Medications:    acetaminophen  (TYLENOL ) tablet 650 mg, 650 mg, Per Tube, Q6H PRN, Drusilla Sabas RAMAN, MD, 650 mg at 03/07/24 0535   albuterol  (PROVENTIL ) (2.5 MG/3ML) 0.083% nebulizer solution 2.5 mg, 2.5 mg, Inhalation, Q6H PRN, Shona Terry SAILOR, DO   aspirin   chewable tablet 81 mg, 81 mg, Per Tube, Daily, Drusilla, Sabas RAMAN, MD, 81 mg at 03/07/24 9088   atenolol  (TENORMIN ) tablet 50 mg, 50 mg, Per Tube, Daily, Drusilla, Sabas RAMAN, MD, 50 mg at 03/07/24 0912   azithromycin (ZITHROMAX) 500 mg in sodium chloride  0.9 % 250 mL IVPB, 500 mg, Intravenous, Q24H, Drusilla, Sabas RAMAN, MD, Held at 03/07/24 1001   cefTRIAXone (ROCEPHIN) 1 g in sodium chloride  0.9 % 100 mL IVPB, 1 g, Intravenous, Once, Reome, Earle J, RPH   [START ON 03/08/2024] cefTRIAXone (ROCEPHIN) 2 g in sodium chloride  0.9 % 100 mL IVPB, 2 g, Intravenous, Q24H, Reome, Earle J, RPH   dexamethasone  (DECADRON ) injection 4 mg, 4 mg, Intravenous, Q12H, Drusilla, Sabas RAMAN, MD, 4 mg at 03/07/24  9086   escitalopram  (LEXAPRO ) tablet 20 mg, 20 mg, Per Tube, Daily, Drusilla, Sabas RAMAN, MD, 20 mg at 03/07/24 0911   feeding supplement (ENSURE PLUS HIGH PROTEIN) liquid 237 mL, 237 mL, Oral, BID BM, Kc, Ramesh, MD, 237 mL at 03/07/24 0914   feeding supplement (JEVITY 1.5 CAL/FIBER) liquid 1,000 mL, 1,000 mL, Per Tube, Continuous, Drusilla, Sabas RAMAN, MD, Last Rate: 41 mL/hr at 03/06/24 1710, 1,000 mL at 03/06/24 1710   feeding supplement (PROSource TF20) liquid 60 mL, 60 mL, Per Tube, Daily, Drusilla, Sabas RAMAN, MD, 60 mL at 03/07/24 9088   folic acid  (FOLVITE ) tablet 1 mg, 1 mg, Per Tube, Daily, Drusilla, Sabas RAMAN, MD, 1 mg at 03/07/24 9088   free water  200 mL, 200 mL, Per Tube, Q4H, Drusilla, Sabas RAMAN, MD, 200 mL at 03/07/24 1224   haloperidol  (HALDOL ) tablet 2 mg, 2 mg, Oral, Daily PRN **OR** haloperidol  lactate (HALDOL ) injection 2 mg, 2 mg, Intramuscular, Daily PRN, Jacquetta Sharlot GRADE, NP   heparin  injection 5,000 Units, 5,000 Units, Subcutaneous, Q8H, Hall, Carole N, DO, 5,000 Units at 03/07/24 0535   LORazepam  (ATIVAN ) tablet 1 mg, 1 mg, Oral, Daily PRN **OR** LORazepam  (ATIVAN ) injection 1 mg, 1 mg, Intravenous, Daily PRN, Jacquetta Sharlot GRADE, NP   methimazole  (TAPAZOLE ) tablet 20 mg, 20 mg, Per Tube, Daily, Drusilla, Sabas RAMAN, MD, 20 mg at 03/07/24 9087   multivitamin with minerals tablet 1 tablet, 1 tablet, Per Tube, Daily, Drusilla Sabas RAMAN, MD, 1 tablet at 03/07/24 9088   Oral care mouth rinse, 15 mL, Mouth Rinse, 4 times per day, Drusilla Sabas RAMAN, MD, 15 mL at 03/07/24 1243   Oral care mouth rinse, 15 mL, Mouth Rinse, PRN, Drusilla, Sabas RAMAN, MD   polyethylene glycol (MIRALAX  / GLYCOLAX ) packet 17 g, 17 g, Per Tube, Daily PRN, Drusilla, Sabas RAMAN, MD   potassium chloride  (KLOR-CON ) packet 40 mEq, 40 mEq, Per Tube, Once, [place], Sabas RAMAN, MD   prochlorperazine  (COMPAZINE ) injection 5 mg, 5 mg, Intravenous, Q6H PRN, Hall, Carole N, DO   thiamine  (VITAMIN B1) injection 100 mg, 100 mg, Intravenous, Q8H, Drusilla, Gagan S, MD, 100 mg at 03/07/24 0535    topiramate  (TOPAMAX ) tablet 100 mg, 100 mg, Per Tube, BID, Drusilla Sabas RAMAN, MD, 100 mg at 03/07/24 0911  Vitals   Vitals:   03/07/24 0705 03/07/24 0745 03/07/24 0912 03/07/24 1215  BP:  (!) 169/67 (!) 163/89 (!) 164/92  Pulse:  97 (!) 112 93  Resp:  (!) 22  (!) 21  Temp:  99 F (37.2 C)  98.8 F (37.1 C)  TempSrc:  Oral  Axillary  SpO2:  91%  100%  Weight: 66.9 kg     Height:  Body mass index is 25.32 kg/m.   Physical Exam   Constitutional: Appears mal-nourished, NAD Cardiovascular: Normal rate and regular rhythm.  Respiratory: Effort normal, non-labored breathing.   Neurologic Examination   Neuro: Mental Status: Patient is awake, alert, oriented to person, place, month, year. Mild aphasia, mild dysarthria. Follows simple commands with repetition.  No neglect.  Cranial Nerves: II: Pupils are equal, round, and reactive to light. Inconsistent VF testing, blinks to threat bilaterally.  III,IV, VI: EOMI without ptosis or diploplia.  V: Facial sensation is symmetric to light touch VII: Facial movement is symmetric.  VIII: hearing is intact to voice X: Uvula elevates symmetrically XI: Shoulder shrug is symmetric. XII: tongue is midline  Motor: Tone is normal. Bulk is normal. 5/5 strength was present in all four extremities.  Sensory: Sensation is reduced to right arm and leg.  Cerebellar: FNF intact bilaterally   Labs/Imaging/Neurodiagnostic studies   CBC:  Recent Labs  Lab 03/28/2024 1634 March 28, 2024 2108 03/06/24 0615 03/07/24 0606  WBC 6.2   < > 12.8* 12.7*  NEUTROABS 4.5  --   --   --   HGB 14.2   < > 11.9* 13.2  HCT 41.5   < > 36.3* 38.9*  MCV 88.3   < > 88.8 88.0  PLT 276   < > 245 227   < > = values in this interval not displayed.   Basic Metabolic Panel:  Lab Results  Component Value Date   NA 151 (H) 03/06/2024   K 3.4 (L) 03/07/2024   CO2 17 (L) 03/06/2024   GLUCOSE 170 (H) 03/06/2024   BUN 35 (H) 03/06/2024   CREATININE 1.35 (H)  03/06/2024   CALCIUM  9.3 03/06/2024   GFRNONAA 58 (L) 03/06/2024   GFRAA 105 12/16/2019   Lipid Panel:  Lab Results  Component Value Date   LDLCALC 75 07/29/2023   HgbA1c:  Lab Results  Component Value Date   HGBA1C 5.3 (A) 10/23/2022   Urine Drug Screen:     Component Value Date/Time   LABOPIA NONE DETECTED 08/09/2023 0218   COCAINSCRNUR NONE DETECTED 08/09/2023 0218   LABBENZ NONE DETECTED 08/09/2023 0218   AMPHETMU NONE DETECTED 08/09/2023 0218   THCU NONE DETECTED 08/09/2023 0218   LABBARB NONE DETECTED 08/09/2023 0218    Alcohol Level     Component Value Date/Time   ETH <15 28-Mar-2024 1634   INR  Lab Results  Component Value Date   INR 1.0 03/10/2023   APTT  Lab Results  Component Value Date   APTT 29 03/10/2023   AED levels: No results found for: PHENYTOIN, ZONISAMIDE, LAMOTRIGINE, LEVETIRACETA  CT Head without contrast(Personally reviewed): No acute abnormality Chronic lacunar infarcts within the right basal ganglia, internal capsule, right thalamus, and pons bilaterally  MRI Brain(Personally reviewed): Right basal ganglia, Left corona Radiata and Left medial temporal lobe acute infarcts Chronic lacunar infarcts right basal ganglia, right thalamus and bilateral pons.  Chronic encephalomalacia left cerebellar hemisphere Extensive confluent white matter disease.  Prominent cerebral volume loss    ASSESSMENT   Casey Warren is a 63 y.o. male with hx of prior CVA, HTN, HLD, etoh abuse, anxiety/depression who presented to ED after being referred by PCP d/t hypotension with unknown etiology.   MRI shows multiple acute infarcts. Patient has aphasia, dysarthria. He had improved responsiveness and mentation since admission. Labs have improved. B12 WNL. Ammonia was slightly elevated yesterday, recommend rechecking today.   RECOMMENDATIONS   For encephalopathy: - Continue CIWA protocol -  Dementia panel, repeat ammonia levels - continue high-dose  thiamine   For acute infarcts: - Frequent Neuro checks per stroke unit protocol - Vascular imaging - CT Angio - TTE w/bubble study - Lipid panel - Statin - will be started if LDL>70 or otherwise medically indicated  PTA: Lipitor 20mg  - A1C - Antithrombotic - Aspirin  and add Plavix when able to take PO, DAPT likely for 3 weeks (may change based on workup results), then Plavix alone at home since he was already on aspirin  at home.   PTA: Aspirin  81mg  - DVT ppx - currently on Heparin  SQ - Smoking cessation - will counsel patient  Patient smokes [employer] cigarellos - SBP goal - Gradually normalize, outside of permissive hypertension window.   Avoid hypotension. Hold home meds  PTA antihypertensives: norvasc  10mg  daily, hydrochlorothiazide  12.5mg  daily, valsartan  40mg  daily - Telemetry monitoring for arrhythmia  - Swallow screen - will be performed prior to PO intake - Stroke education - will be given - PT/OT/SLP   ______________________________________________________________________    Signed, Rocky JAYSON Likes, NP [facility]  NEUROHOSPITALIST ADDENDUM Performed a face to face diagnostic evaluation.   I have reviewed the contents of history and physical exam as documented by PA/ARNP/Resident and agree with above documentation.  I have discussed and formulated the above plan as documented. Edits to the note have been made as needed.  Impression/Key exam findingsPlan:  Plan discussed with Dr. Franky with the overnight hospitalist team, plan also disucssed with patient's daughter at the bedside. Stroke workup ordered.  Salman Khaliqdina, MD [facility] [phone]   If 7pm to 7am, please call on call as listed on AMION.

## 2024-03-08 ENCOUNTER — Encounter (HOSPITAL_COMMUNITY)

## 2024-03-08 ENCOUNTER — Encounter (HOSPITAL_COMMUNITY): Payer: Self-pay | Admitting: Internal Medicine

## 2024-03-08 ENCOUNTER — Inpatient Hospital Stay (HOSPITAL_COMMUNITY)

## 2024-03-08 DIAGNOSIS — I959 Hypotension, unspecified: Secondary | ICD-10-CM | POA: Diagnosis not present

## 2024-03-08 DIAGNOSIS — I639 Cerebral infarction, unspecified: Secondary | ICD-10-CM

## 2024-03-08 DIAGNOSIS — I6389 Other cerebral infarction: Secondary | ICD-10-CM

## 2024-03-08 DIAGNOSIS — R29705 NIHSS score 5: Secondary | ICD-10-CM | POA: Diagnosis not present

## 2024-03-08 DIAGNOSIS — E785 Hyperlipidemia, unspecified: Secondary | ICD-10-CM

## 2024-03-08 DIAGNOSIS — E861 Hypovolemia: Secondary | ICD-10-CM | POA: Diagnosis not present

## 2024-03-08 DIAGNOSIS — N179 Acute kidney failure, unspecified: Secondary | ICD-10-CM | POA: Diagnosis not present

## 2024-03-08 DIAGNOSIS — I69391 Dysphagia following cerebral infarction: Secondary | ICD-10-CM

## 2024-03-08 DIAGNOSIS — F1010 Alcohol abuse, uncomplicated: Secondary | ICD-10-CM

## 2024-03-08 DIAGNOSIS — E43 Unspecified severe protein-calorie malnutrition: Secondary | ICD-10-CM | POA: Insufficient documentation

## 2024-03-08 DIAGNOSIS — F17200 Nicotine dependence, unspecified, uncomplicated: Secondary | ICD-10-CM

## 2024-03-08 DIAGNOSIS — E440 Moderate protein-calorie malnutrition: Secondary | ICD-10-CM

## 2024-03-08 DIAGNOSIS — I6350 Cerebral infarction due to unspecified occlusion or stenosis of unspecified cerebral artery: Secondary | ICD-10-CM | POA: Diagnosis not present

## 2024-03-08 LAB — ECHOCARDIOGRAM COMPLETE
AR max vel: 4.07 cm2
AV Area VTI: 4.6 cm2
AV Area mean vel: 4.42 cm2
AV Mean grad: 3 mmHg
AV Peak grad: 8 mmHg
Ao pk vel: 1.41 m/s
Area-P 1/2: 2.87 cm2
Calc EF: 67.8 %
Height: 64 in
MV VTI: 4.15 cm2
S' Lateral: 2.8 cm
Single Plane A2C EF: 68.1 %
Single Plane A4C EF: 68 %
Weight: 2359.8 [oz_av]

## 2024-03-08 LAB — LIPID PANEL
Cholesterol: 121 mg/dL (ref 0–200)
HDL: 24 mg/dL — ABNORMAL LOW (ref >40)
LDL Cholesterol: 78 mg/dL (ref 0–99)
Total CHOL/HDL Ratio: 5 ratio
Triglycerides: 96 mg/dL (ref <150)
VLDL: 19 mg/dL (ref 0–40)

## 2024-03-08 LAB — CBC
HCT: 33.3 % — ABNORMAL LOW (ref 39.0–52.0)
Hemoglobin: 11.3 g/dL — ABNORMAL LOW (ref 13.0–17.0)
MCH: 29.6 pg (ref 26.0–34.0)
MCHC: 33.9 g/dL (ref 30.0–36.0)
MCV: 87.2 fL (ref 80.0–100.0)
Platelets: 210 K/uL (ref 150–400)
RBC: 3.82 MIL/uL — ABNORMAL LOW (ref 4.22–5.81)
RDW: 13 % (ref 11.5–15.5)
WBC: 10.8 K/uL — ABNORMAL HIGH (ref 4.0–10.5)
nRBC: 0 % (ref 0.0–0.2)

## 2024-03-08 LAB — POTASSIUM: Potassium: 3.8 mmol/L (ref 3.5–5.1)

## 2024-03-08 LAB — HEMOGLOBIN A1C
Hgb A1c MFr Bld: 6.8 % — ABNORMAL HIGH (ref 4.8–5.6)
Mean Plasma Glucose: 148.46 mg/dL

## 2024-03-08 LAB — GLUCOSE, CAPILLARY
Glucose-Capillary: 131 mg/dL — ABNORMAL HIGH (ref 70–99)
Glucose-Capillary: 134 mg/dL — ABNORMAL HIGH (ref 70–99)
Glucose-Capillary: 162 mg/dL — ABNORMAL HIGH (ref 70–99)
Glucose-Capillary: 163 mg/dL — ABNORMAL HIGH (ref 70–99)

## 2024-03-08 LAB — RAPID URINE DRUG SCREEN, HOSP PERFORMED
Amphetamines: NOT DETECTED
Barbiturates: NOT DETECTED
Benzodiazepines: POSITIVE — AB
Cocaine: NOT DETECTED
Opiates: NOT DETECTED
Tetrahydrocannabinol: NOT DETECTED

## 2024-03-08 LAB — PHOSPHORUS: Phosphorus: 4.3 mg/dL (ref 2.5–4.6)

## 2024-03-08 LAB — MAGNESIUM: Magnesium: 1.6 mg/dL — ABNORMAL LOW (ref 1.7–2.4)

## 2024-03-08 MED ORDER — DEXAMETHASONE SODIUM PHOSPHATE 4 MG/ML IJ SOLN
4.0000 mg | INTRAMUSCULAR | Status: AC
Start: 2024-03-09 — End: 2024-03-12
  Administered 2024-03-09 – 2024-03-11 (×3): 4 mg via INTRAVENOUS
  Filled 2024-03-08 (×3): qty 1

## 2024-03-08 MED ORDER — STROKE: EARLY STAGES OF RECOVERY BOOK
Freq: Once | Status: AC
  Filled 2024-03-08: qty 1

## 2024-03-08 MED ORDER — CLOPIDOGREL BISULFATE 75 MG PO TABS
75.0000 mg | ORAL_TABLET | Freq: Every day | ORAL | Status: DC
  Administered 2024-03-09 – 2024-03-16 (×8): 75 mg via ORAL
  Filled 2024-03-08 (×8): qty 1

## 2024-03-08 MED ORDER — JEVITY 1.5 CAL/FIBER PO LIQD
1000.0000 mL | ORAL | Status: DC
  Administered 2024-03-08 – 2024-03-09 (×2): 1000 mL
  Filled 2024-03-08 (×2): qty 1000

## 2024-03-08 MED ORDER — ESCITALOPRAM OXALATE 10 MG PO TABS
20.0000 mg | ORAL_TABLET | Freq: Every day | ORAL | Status: DC
  Administered 2024-03-09 – 2024-03-16 (×8): 20 mg via ORAL
  Filled 2024-03-08 (×8): qty 2

## 2024-03-08 MED ORDER — ASPIRIN 81 MG PO CHEW
81.0000 mg | CHEWABLE_TABLET | Freq: Every day | ORAL | Status: DC
  Administered 2024-03-09 – 2024-03-11 (×3): 81 mg via ORAL
  Filled 2024-03-08 (×4): qty 1

## 2024-03-08 MED ORDER — FOLIC ACID 1 MG PO TABS
1.0000 mg | ORAL_TABLET | Freq: Every day | ORAL | Status: DC
  Administered 2024-03-09 – 2024-03-16 (×8): 1 mg via ORAL
  Filled 2024-03-08 (×7): qty 1

## 2024-03-08 MED ORDER — MAGNESIUM SULFATE 2 GM/50ML IV SOLN
2.0000 g | Freq: Once | INTRAVENOUS | Status: AC
  Administered 2024-03-08: 2 g via INTRAVENOUS
  Filled 2024-03-08: qty 50

## 2024-03-08 MED ORDER — ADULT MULTIVITAMIN W/MINERALS CH
1.0000 | ORAL_TABLET | Freq: Every day | ORAL | Status: DC
  Administered 2024-03-09 – 2024-03-16 (×8): 1 via ORAL
  Filled 2024-03-08 (×8): qty 1

## 2024-03-08 MED ORDER — CLOPIDOGREL BISULFATE 75 MG PO TABS
75.0000 mg | ORAL_TABLET | Freq: Every day | ORAL | Status: DC
  Administered 2024-03-08: 75 mg
  Filled 2024-03-08: qty 1

## 2024-03-08 MED ORDER — ACETAMINOPHEN 325 MG PO TABS
650.0000 mg | ORAL_TABLET | Freq: Four times a day (QID) | ORAL | Status: DC | PRN
  Administered 2024-03-09 – 2024-03-14 (×2): 650 mg via ORAL
  Filled 2024-03-08 (×2): qty 2

## 2024-03-08 MED ORDER — FREE WATER
200.0000 mL | Freq: Four times a day (QID) | Status: DC
  Administered 2024-03-08 – 2024-03-09 (×4): 200 mL

## 2024-03-08 MED ORDER — METHIMAZOLE 10 MG PO TABS
20.0000 mg | ORAL_TABLET | Freq: Every day | ORAL | Status: DC
  Administered 2024-03-09 – 2024-03-16 (×8): 20 mg via ORAL
  Filled 2024-03-08 (×8): qty 2

## 2024-03-08 MED ORDER — POLYETHYLENE GLYCOL 3350 17 G PO PACK: 17.0000 g | PACK | Freq: Every day | ORAL | Status: DC | PRN

## 2024-03-08 MED ORDER — ATENOLOL 50 MG PO TABS
50.0000 mg | ORAL_TABLET | Freq: Every day | ORAL | Status: DC
  Administered 2024-03-09 – 2024-03-13 (×5): 50 mg via ORAL
  Filled 2024-03-08 (×6): qty 1

## 2024-03-08 MED ORDER — TOPIRAMATE 25 MG PO TABS
100.0000 mg | ORAL_TABLET | Freq: Two times a day (BID) | ORAL | Status: DC
Start: 2024-03-08 — End: 2024-03-16
  Administered 2024-03-08 – 2024-03-16 (×16): 100 mg via ORAL
  Filled 2024-03-08 (×16): qty 4

## 2024-03-08 MED ORDER — ATORVASTATIN CALCIUM 40 MG PO TABS
40.0000 mg | ORAL_TABLET | Freq: Every day | ORAL | Status: DC
  Administered 2024-03-08 – 2024-03-11 (×4): 40 mg via ORAL
  Filled 2024-03-08 (×4): qty 1

## 2024-03-08 NOTE — Progress Notes (Addendum)
 STROKE TEAM PROGRESS NOTE   INTERIM HISTORY/SUBJECTIVE Family is at the bedside.  No new neurological events overnight.  He is awake alert oriented to self has bilateral mittens on.  MRI brain with multiple strokes    CBC    Component Value Date/Time   WBC 10.8 (H) 03/08/2024 0431   RBC 3.82 (L) 03/08/2024 0431   HGB 11.3 (L) 03/08/2024 0431   HGB 17.4 07/29/2023 1336   HCT 33.3 (L) 03/08/2024 0431   HCT 50.4 07/29/2023 1336   PLT 210 03/08/2024 0431   PLT 292 07/29/2023 1336   MCV 87.2 03/08/2024 0431   MCV 93 07/29/2023 1336   MCH 29.6 03/08/2024 0431   MCHC 33.9 03/08/2024 0431   RDW 13.0 03/08/2024 0431   RDW 13.2 07/29/2023 1336   LYMPHSABS 1.0 03/02/2024 1634   LYMPHSABS 2.0 07/29/2023 1336   MONOABS 0.6 03/02/2024 1634   EOSABS 0.1 03/02/2024 1634   EOSABS 0.2 07/29/2023 1336   BASOSABS 0.0 03/02/2024 1634   BASOSABS 0.1 07/29/2023 1336    BMET    Component Value Date/Time   NA 140 03/07/2024 1505   NA 140 07/29/2023 1336   K 3.8 03/08/2024 0431   CL 113 (H) 03/07/2024 1505   CO2 17 (L) 03/07/2024 1505   GLUCOSE 167 (H) 03/07/2024 1505   BUN 32 (H) 03/07/2024 1505   BUN 6 (L) 07/29/2023 1336   CREATININE 1.14 03/07/2024 1505   CALCIUM  8.5 (L) 03/07/2024 1505   EGFR 92 07/29/2023 1336   GFRNONAA >60 03/07/2024 1505    IMAGING past 24 hours CT ANGIO HEAD NECK W WO CM Result Date: 03/08/2024 CLINICAL DATA:  Initial evaluation for neuro deficit, stroke. EXAM: CT ANGIOGRAPHY HEAD AND NECK WITH AND WITHOUT CONTRAST TECHNIQUE: Multidetector CT imaging of the head and neck was performed using the standard protocol during bolus administration of intravenous contrast. Multiplanar CT image reconstructions and MIPs were obtained to evaluate the vascular anatomy. Carotid stenosis measurements (when applicable) are obtained utilizing NASCET criteria, using the distal internal carotid diameter as the denominator. RADIATION DOSE REDUCTION: This exam was performed according  to the departmental dose-optimization program which includes automated exposure control, adjustment of the mA and/or kV according to patient size and/or use of iterative reconstruction technique. CONTRAST:  75mL OMNIPAQUE  IOHEXOL  350 MG/ML SOLN COMPARISON:  MRI from earlier the same day as well as prior CTA from 08/09/2023. FINDINGS: CT HEAD FINDINGS Brain: Atrophy with chronic small vessel ischemic disease and multiple chronic infarcts noted, described on MRI oral the same day. Superimposed acute ischemic infarcts involving the bilateral cerebral hemispheres, also better seen on prior MRI. No new large vessel territory infarct. No acute intracranial hemorrhage. No mass lesion or midline shift. Stable ventricular size without hydrocephalus. No extra-axial fluid collection. Vascular: No abnormal hyperdense vessel. Skull: Scalp soft tissues and calvarium demonstrate no new finding. Sinuses/Orbits: Globes orbital soft tissues within normal limits. Paranasal sinuses are largely clear. Small left mastoid effusion noted. Nasogastric tube in place. Other: None. Review of the MIP images confirms the above findings CTA NECK FINDINGS Aortic arch: Bovine branching. Imaged portion shows no evidence of aneurysm or dissection. No significant stenosis of the major arch vessel origins. Mild aortic atherosclerosis. Right carotid system: Right common and internal carotid arteries are patent without dissection. Mild atheromatous change about the right carotid bulb without stenosis. Left carotid system: Left common and internal carotid arteries are patent without dissection. Mild atheromatous change about the left carotid bulb without stenosis. Vertebral arteries:  No evidence of dissection, stenosis (50% or greater), or occlusion. Skeleton: No worrisome osseous lesions. Mild-to-moderate spondylosis at C4-5. Other neck: No other acute finding. Upper chest: 5 mm left upper lobe pulmonary nodule (series 11, image 168). Review of the MIP  images confirms the above findings CTA HEAD FINDINGS Anterior circulation: Is atheromatous plaque about the carotid siphons with associated mild to moderate narrowing bilaterally, similar to prior. A1 segments patent. Normal anterior communicating artery complex. Both ACAs patent to their distal aspects without significant stenosis. No M1 stenosis or occlusion. No proximal MCA branch occlusion or high-grade stenosis. Distal MCA branches remain perfused and fairly symmetric. Distal small vessel atheromatous irregularity noted. Posterior circulation: Left V4 segment patent without stenosis. Atheromatous plaque within the right V4 segment with short-segment moderate stenosis, beyond the takeoff of the right PICA, stable. Both PICA are patent. Basilar patent without stenosis. Superior cerebellar arteries patent bilaterally. Both PCAs primarily supplied via the basilar. PCAs are mildly irregular but patent to their distal aspects without significant stenosis. Venous sinuses: Not well assessed due to timing the contrast bolus. Anatomic variants: None significant.  No intracranial aneurysm. Review of the MIP images confirms the above findings IMPRESSION: CT HEAD: 1. Acute ischemic nonhemorrhagic infarcts involving the right basal ganglia, left corona radiata, and mesial left temporal lobe, better seen on prior brain MRI from earlier today. 2. No other new acute intracranial abnormality. 3. Underlying atrophy with chronic small vessel ischemic disease and multiple chronic infarcts, stable. CTA HEAD AND NECK: 1. Negative CTA for large vessel occlusion or other emergent finding. Stable CTA as compared to 08/09/2023. 2. Atheromatous plaque about the carotid siphons with associated mild to moderate narrowing bilaterally, similar to prior. 3. Short-segment moderate stenosis involving the right V4 segment, stable. 4. 5 mm left upper lobe pulmonary nodule, indeterminate. This was seen on recent chest CT from 03/02/2024. Please  refer to that examination regarding any potential follow-up recommendations regarding this finding. Aortic Atherosclerosis (ICD10-I70.0). Electronically Signed   By: Morene Hoard M.D.   On: 03/08/2024 01:45   MR BRAIN WO CONTRAST Result Date: 03/07/2024 EXAM: MRI BRAIN WITHOUT CONTRAST 03/07/2024 11:05:00 AM TECHNIQUE: Multiplanar multisequence MRI of the head/brain was performed without the administration of intravenous contrast. COMPARISON: CT of the head dated 03/04/2024 and MRI of the head dated 07/09/2016. CLINICAL HISTORY: Mental status change, unknown cause. FINDINGS: BRAIN AND VENTRICLES: Foci of restricted diffusion within the right basal ganglia, left corona radiata, and left medial temporal lobe. No acute intracranial hemorrhage. No mass. No midline shift. No hydrocephalus. Prominent cerebral volume loss, which is advanced with the patient's age. Chronic encephalomalacia changes within the left cerebellar hemisphere. Chronic lacunar infarcts within the right basal ganglia, right thalamus, and within the pons bilaterally. Extensive confluent cerebral white matter disease. The sella is unremarkable. Normal flow voids. ORBITS: No acute abnormality. SINUSES AND MASTOIDS: No acute abnormality. BONES AND SOFT TISSUES: Normal marrow signal. No acute soft tissue abnormality. IMPRESSION: 1. Acute infarcts with restricted diffusion in the right basal ganglia, left corona radiata, and left medial temporal lobe. 2. 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 3. 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 4. Extensive confluent cerebral white matter disease. 5. Prominent cerebral volume loss, advanced for age. Electronically signed by: Evalene Coho MD 03/07/2024 11:45 AM EDT RP Workstation: [id number]   EEG adult Result Date: 03/07/2024 Casey Casey HERO, MD     03/07/2024 11:03 AM Routine EEG Report Casey Warren is  a 63 y.o. male with a  history of seizure who is undergoing an EEG to evaluate for seizures. Report: This EEG was acquired with electrodes placed according to the International 10-20 electrode system (including Fp1, Fp2, F3, F4, C3, C4, P3, P4, O1, O2, T3, T4, T5, T6, A1, A2, Fz, Cz, Pz). The following electrodes were missing or displaced: none. The occipital dominant rhythm was 9 Hz. This activity is reactive to stimulation. Drowsiness was manifested by background fragmentation; deeper stages of sleep were identified by K complexes and sleep spindles. There was bitemporal focal slowing. There were rare sharp waves over the left temporal region. There were no electrographic seizures identified. There was no abnormal response to photic stimulation or hyperventilation. Impression and clinical correlation: This EEG was obtained while awake and asleep and is abnormal due to: - Focal bitemporal slowing indicative of focal cerebral dysfunction in those regions - Rare left temporal sharp waves indicative of increased epileptogenic potential in those regions Casey Ross, MD [facility] [phone] If 7pm- 7am, please page neurology on call as listed in AMION.    Vitals:   03/07/24 1920 03/07/24 2254 03/08/24 0309 03/08/24 0800  BP: 139/88 (!) 161/91 (!) 149/87 138/82  Pulse:    81  Resp: (!) 26   18  Temp: 98.4 F (36.9 C) 98.8 F (37.1 C) 98.1 F (36.7 C)   TempSrc: Axillary Axillary Axillary   SpO2: 96%  97% 95%  Weight:      Height:         PHYSICAL EXAM General:  Alert, well-nourished, well-developed patient in no acute distress Psych:  Mood and affect appropriate for situation CV: Regular rate and rhythm on monitor Respiratory:  Regular, unlabored respirations on room air GI: Abdomen soft and nontender   NEURO:  Mental Status: AA&O to self.  For age stated 4, unable to state current month or year.  Some perseveration Speech/Language: speech is without aphasia.  Dysarthria able to name, slight  paucity of speech  Cranial Nerves:  II: PERRL. Visual fields blinks to threat bilaterally III, IV, VI: EOMI. Eyelids elevate symmetrically.  Tracks V: Sensation is intact to light touch and symmetrical to face.  VII: Mild left facial droop VIII: hearing intact to voice. IX, X: Palate elevates symmetrically. Phonation is normal.  KP:Dynloizm shrug 5/5. XII: tongue is midline without fasciculations. Motor: Bilateral uppers 4/5, right lower 4 -/5, left lower 3/5 Tone: is normal and bulk is normal Sensation- Intact to light touch bilaterally. Extinction absent to light touch to DSS.   Coordination: FTN intact bilaterally, HKS: no ataxia in BLE.No drift.  Gait- deferred  Most Recent NIH  1a Level of Conscious.:  1b LOC Questions:  1c LOC Commands:  2 Best Gaze:  3 Visual:  4 Facial Palsy: 1 5a Motor Arm - left:  5b Motor Arm - Right:  6a Motor Leg - Left: 2 6b Motor Leg - Right: 1 7 Limb Ataxia:  8 Sensory:  9 Best Language:  10 Dysarthria: 1 11 Extinct. and Inatten.:  TOTAL: 5   ASSESSMENT/PLAN  Mr. Casey Warren is a 62 y.o. male with history of  prior CVA, HTN, HLD, etoh abuse, anxiety/depression who presented to ED after being referred by PCP d/t hypotension with unknown etiology.   On admission to ED, SBP was in the 70s, Cr 4.14. He was bolused with 2L with subsequent improvement in BP. CXR concerning for pneumonia versus pulmonary edema.  He had continued worsening mental status and MRI  was obtained and revealed acute infarcts  Stroke:  small right basal ganglia, left corona radiata, left medial temporal lobe infarcts, etiology:   likely synchronized small vessel, however, can not completely rule out embolic source CT head No acute abnormality. Atrophy. chronic inferior cerebellar infarcts and chronic lacunar infarcts within the pons and basal ganglia on the right. CTA head & neck No LVO, right V4 moderate stenosis, bilateral ICA siphon mild to moderate stenosis.  MRI   Acute infarcts with restricted diffusion in the right basal ganglia, left corona radiata, and left medial temporal lobe. EEG 9/28 Rare left temporal sharp waves indicative of increased epileptogenic potential in those regions LE venous US  ordered  2D Echo EF 60-65% Recommend 30 day heart monitor after discharge  LDL 78 HgbA1c 6.8 UDS + benzo (hospital meds) VTE prophylaxis - hep Sq aspirin  81 mg daily prior to admission, now on aspirin  81 mg daily and clopidogrel 75 mg daily for 3 weeks and then Plavix  alone. Therapy recommendations:  CIR Disposition:  pending   AKI Dehydration Anorexia Decreased intake since stopped drinking alcohol Creatinine 4.14-1.97-1.53-1.14 Status post IV bolus On tube feeding and free water   ? Pneumonia CXR Diffuse interstitial prominence could reflect edema or atypical infection. On azithromycin and Rocephin  Hx of hypertension Hypotension on presentation Home meds: Amlodipine  10 mg, clonidine  0.1 mg, HCTZ 12.5 mg, losartan 40 mg Stable now On atenolol  BP low on presentation due to dehydration Blood pressure normotensive   Hyperlipidemia Home meds: Atorvastatin  20 mg  LDL 78, goal < 70 AST/ALT 60/43--60/24 Increase to 40 mg when able Continue statin at discharge  Tobacco Abuse Patient smokes 4 cigars /day       Ready to quit? Yes Nicotine replacement therapy provided  ETOH Abuse Heavy drinker per daughter  ETOH use, alcohol level <15, advised to drink no more than 1 drink(s) a day CIWA  protocol  Thiamine , folate, multivitamin daily UDS positive for benzos       Ready to quit? Yes TOC consult for cessation placed  Dysphagia Patient has post-stroke dysphagia SLP consulted NPO now On TF with FW  Other stroke risk factors Advanced age History of stroke, per daughter it occurred 5 years ago, no details, MRI showed chronic inferior cerebellar infarcts and chronic lacunar infarcts within the pons and basal ganglia on the  right. History of drug abuse  Other Active Problems Hyperthyroidism/concern for hyperthyroid crisis management per primary team, on steroids and methimazole  Leukocytosis WBC 12.8-12.7-10.8 GERD Anxiety on Lexapro  Asthma  Hospital day # 6   Karna Geralds DNP, ACNPC-AG  [facility]  ATTENDING NOTE: I reviewed above note and agree with the assessment and plan. Pt was seen and examined.   Daughter  is at the bedside. Pt is awake, alert, eyes open, orientated to place but not to time or age. Told me age 73 instead of 7. Intermittent perseveration, no aphasia but paucity of speech mild dysarthria. Following most simple commands. Able to name 2/4 and repeat simple sentence in dysarthric voice. No gaze palsy, tracking bilaterally, blinking to visual threat bilaterally. No significant facial droop. Tongue midline. Bilateral UEs 4/5, no drift. Bilaterally LEs 3/5. Sensation symmetrical bilaterally, b/l FTN intact but slow, gait not tested.   For detailed assessment and plan, please refer to above as I have made changes wherever appropriate.   Casey Cummins, MD PhD Stroke Neurology 03/08/2024 4:14 PM     To contact Stroke Continuity provider, please refer to [email or web address]. After hours, contact General  Neurology

## 2024-03-08 NOTE — Evaluation (Signed)
 Physical Therapy Re-Evaluation Patient Details Name: Casey Warren MRN: [id number] DOB: Nov 13, 1960 Today's Date: 03/08/2024  History of Present Illness  Pt is 63 yo with hypotension referred from PCP on 9/23. 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 MRI 9/28 shows Acute infarcts with restricted diffusion in the R BG, L corona radiata, and L medial temporal lobe. PMH significant of seizures, PUD, GERD, hypertension, anemia, alcohol use, anxiety.  Clinical Impression   PT re-evaluated pt today in setting of acute cva found on MRI, decline in function. Pt presents with LE weakness L>R, significant posterior bias in sitting and standing, and poor activity tolerance vs anticipated baseline. Pt to benefit from acute PT to address deficits. Pt requiring up to mod +2 assist for transfer-level mobility, most limited by shortened step length, weakness, and severe posterior bias that pt is unable to correct without assist. Patient will benefit from intensive inpatient follow-up therapy, >3 hours/day. PT to progress mobility as tolerated, and will continue to follow acutely.          If plan is discharge home, recommend the following: Assist for transportation;Help with stairs or ramp for entrance;Supervision due to cognitive status;A lot of help with walking and/or transfers;A lot of help with bathing/dressing/bathroom   Can travel by private vehicle        Equipment Recommendations None recommended by PT (tbd)  Recommendations for Other Services       Functional Status Assessment Patient has had a recent decline in their functional status and demonstrates the ability to make significant improvements in function in a reasonable and predictable amount of time.     Precautions / Restrictions Precautions Precautions: Fall Recall of Precautions/Restrictions:  Impaired Precaution/Restrictions Comments: cortrak, posey belt, bilat mits Restrictions Weight Bearing Restrictions Per Provider Order: No      Mobility  Bed Mobility Overal bed mobility: Needs Assistance Bed Mobility: Supine to Sit     Supine to sit: Min assist          Transfers Overall transfer level: Needs assistance Equipment used: 2 person hand held assist Transfers: Sit to/from Stand, Bed to chair/wheelchair/BSC Sit to Stand: Min assist, +2 safety/equipment, +2 physical assistance   Step pivot transfers: Mod assist, +2 physical assistance, +2 safety/equipment       General transfer comment: significant posterior bias, assist to steady. stand x2 from EOB    Ambulation/Gait               General Gait Details: unable this date given severe posterior bias  Stairs            Wheelchair Mobility     Tilt Bed    Modified Rankin (Stroke Patients Only) Modified Rankin (Stroke Patients Only) Pre-Morbid Rankin Score: No significant disability Modified Rankin: Moderately severe disability     Balance Overall balance assessment: Needs assistance Sitting-balance support: No upper extremity supported, Feet supported Sitting balance-Leahy Scale: Fair Sitting balance - Comments: close CGA, posterior bias with any challenge Postural control: Posterior lean Standing balance support: Single extremity supported, During functional activity, Reliant on assistive device for balance Standing balance-Leahy Scale: Poor                               Pertinent Vitals/Pain Pain Assessment Pain Assessment: No/denies pain    Home Living Family/patient expects to be  discharged to:: Private residence Living Arrangements: Spouse/significant other Available Help at Discharge: Family Type of Home: House Home Access: Stairs to enter Entrance Stairs-Rails: Can reach both Entrance Stairs-Number of Steps: 6   Home Layout: One level   Additional  Comments: Pt not reliable historian    Prior Function Prior Level of Function : Patient poor historian/Family not available                     Extremity/Trunk Assessment   Upper Extremity Assessment Upper Extremity Assessment: Defer to OT evaluation RUE Deficits / Details: ROM is WFL, strength is grossly 4/5. LUE Deficits / Details: hx of shoulder injury, unable to raise arm over head due to pain. Elbow, wrist and hand ROM is WFL. Pt reports paraesthesias throughout extremity. Grip strength is 3/5. Elbow is 4/5. FM assessment is limited due to long fingernails LUE Coordination: decreased fine motor;decreased gross motor    Lower Extremity Assessment Lower Extremity Assessment: Generalized weakness;LLE deficits/detail;RLE deficits/detail RLE Deficits / Details: knee extension 4/5, knee flexion 3+/5, hip abd/add 3/5, at least 3/5 DF/PF LLE Deficits / Details: knee extension 3+/5, knee flexion 3+/5, hip abd/add 3/5, at least 3/5 DF/PF LLE Coordination: decreased gross motor    Cervical / Trunk Assessment Cervical / Trunk Assessment: Normal  Communication   Communication Communication: Impaired Factors Affecting Communication: Reduced clarity of speech    Cognition Arousal: Alert Behavior During Therapy: WFL for tasks assessed/performed   PT - Cognitive impairments: Orientation, Awareness, Sequencing, Problem solving, Safety/Judgement                       PT - Cognition Comments: A&Ox4 initially, then happy holidays as PT is leaving the room. Inconsistent command following, requires increased time as well Following commands: Impaired Following commands impaired: Follows one step commands inconsistently     Cueing Cueing Techniques: Verbal cues, Gestural cues, Tactile cues     General Comments General comments (skin integrity, edema, etc.): VSS on RA    Exercises     Assessment/Plan    PT Assessment Patient needs continued PT services  PT Problem  List Decreased strength;Decreased balance;Decreased mobility;Decreased activity tolerance;Decreased safety awareness;Decreased cognition       PT Treatment Interventions DME instruction;Functional mobility training;Gait training;Stair training;Therapeutic activities;Balance training;Neuromuscular re-education;Patient/family education;Therapeutic exercise    PT Goals (Current goals can be found in the Care Plan section)  Acute Rehab PT Goals PT Goal Formulation: Patient unable to participate in goal setting Time For Goal Achievement: 03/17/24 Potential to Achieve Goals: Good    Frequency Min 2X/week     Co-evaluation PT/OT/SLP Co-Evaluation/Treatment: Yes Reason for Co-Treatment: Complexity of the patient's impairments (multi-system involvement);For patient/therapist safety;To address functional/ADL transfers PT goals addressed during session: Mobility/safety with mobility;Balance OT goals addressed during session: ADL's and self-care       AM-PAC PT 6 Clicks Mobility  Outcome Measure Help needed turning from your back to your side while in a flat bed without using bedrails?: A Lot Help needed moving from lying on your back to sitting on the side of a flat bed without using bedrails?: A Lot Help needed moving to and from a bed to a chair (including a wheelchair)?: A Lot Help needed standing up from a chair using your arms (e.g., wheelchair or bedside chair)?: A Lot Help needed to walk in hospital room?: Total Help needed climbing 3-5 steps with a railing? : Total 6 Click Score: 10    End of Session Equipment  Utilized During Treatment: Gait belt Activity Tolerance: Patient limited by fatigue Patient left: with call bell/phone within reach;in chair;with chair alarm set;with restraints reapplied Nurse Communication: Mobility status PT Visit Diagnosis: Unsteadiness on feet (R26.81);Other abnormalities of gait and mobility (R26.89);Muscle weakness (generalized) (M62.81)    Time:  8864-8841 PT Time Calculation (min) (ACUTE ONLY): 23 min   Charges:   PT Evaluation $PT Re-evaluation: 1 Re-eval   PT General Charges $$ ACUTE PT VISIT: 1 Visit         Johana RAMAN, PT DPT Acute Rehabilitation Services Secure Chat Preferred  Office [phone]   Kharizma Lesnick E Johna 03/08/2024, 2:32 PM

## 2024-03-08 NOTE — Progress Notes (Signed)
 Occupational Therapy Treatment Patient Details Name: Casey Warren MRN: [id number] DOB: March 05, 1961 Today's Date: 03/08/2024   History of present illness Pt is 63 yo with hypotension referred from PCP. 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 MRI 9/28 shows Acute infarcts with restricted diffusion in the R BG, L corona radiata, and L medial temporal lobe. PMH significant of seizures, PUD, GERD, hypertension, anemia, alcohol use, anxiety.   OT comments  Pt is making steady progress towards their acute OT goals. Pt seen with PT today to safely progress OOB. Overall pt is limited by chronic L shoulder pain, long fingernails, global weakness, L hemi paraesthesias, significant posterior bias in standing and impaired cognition. Overall he needed min A to stand and progressed to mod A +2 for standing balance and to take pivotal steps to the chair. Pt has poor insight and problem solving and benefits from simple 1 step commands. OT to continue to follow acutely to facilitate progress towards established goals. Pt will benefit from intensive inpatient follow up therapy, >3 hours/day after discharge.   Pt perseverating on wanting water  - SLP evaluation recommended.        If plan is discharge home, recommend the following:  A lot of help with walking and/or transfers;A lot of help with bathing/dressing/bathroom;Assistance with cooking/housework;Direct supervision/assist for medications management;Assist for transportation;Help with stairs or ramp for entrance   Equipment Recommendations  None recommended by OT (defer)    Recommendations for Other Services Speech consult;Rehab consult    Precautions / Restrictions Precautions Precautions: Fall Recall of Precautions/Restrictions: Impaired Precaution/Restrictions Comments: cortrak, posey belt, bilat  mits Restrictions Weight Bearing Restrictions Per Provider Order: No       Mobility Bed Mobility Overal bed mobility: Needs Assistance Bed Mobility: Supine to Sit     Supine to sit: Min assist          Transfers Overall transfer level: Needs assistance Equipment used: 2 person hand held assist Transfers: Sit to/from Stand, Bed to chair/wheelchair/BSC Sit to Stand: Min assist, +2 safety/equipment, +2 physical assistance     Step pivot transfers: Mod assist, +2 physical assistance, +2 safety/equipment     General transfer comment: significant posterior bias     Balance Overall balance assessment: Needs assistance Sitting-balance support: No upper extremity supported, Feet supported Sitting balance-Leahy Scale: Fair Sitting balance - Comments: close CGA Postural control: Posterior lean Standing balance support: Single extremity supported, During functional activity Standing balance-Leahy Scale: Poor                             ADL either performed or assessed with clinical judgement   ADL Overall ADL's : Needs assistance/impaired Eating/Feeding: NPO   Grooming: Minimal assistance;Sitting   Upper Body Bathing: Minimal assistance;Cueing for UE precautions   Lower Body Bathing: Maximal assistance;Sit to/from stand   Upper Body Dressing : Minimal assistance;Sitting   Lower Body Dressing: Maximal assistance;Sit to/from stand   Toilet Transfer: Moderate assistance;+2 for safety/equipment;+2 for physical assistance;BSC/3in1   Toileting- Clothing Manipulation and Hygiene: Total assistance;Sit to/from stand       Functional mobility during ADLs: Moderate assistance;+2 for physical assistance;+2 for safety/equipment General ADL Comments: limited by posterior bias, impaired cognition and global weakness    Extremity/Trunk Assessment Upper Extremity Assessment Upper Extremity Assessment: RUE deficits/detail;Right hand dominant;LUE deficits/detail RUE  Deficits / Details: ROM  is WFL, strength is grossly 4/5. LUE Deficits / Details: hx of shoulder injury, unable to raise arm over head due to pain. Elbow, wrist and hand ROM is WFL. Pt reports paraesthesias throughout extremity. Grip strength is 3/5. Elbow is 4/5. FM assessment is limited due to long fingernails LUE Coordination: decreased fine motor;decreased gross motor   Lower Extremity Assessment Lower Extremity Assessment: Defer to PT evaluation        Vision   Vision Assessment?: No apparent visual deficits   Perception Perception Perception:  (need further assessment - seemed WFL through observation on evaluation)   Praxis Praxis Praxis: Not tested   Communication Communication Communication: Impaired Factors Affecting Communication: Reduced clarity of speech   Cognition Arousal: Alert Behavior During Therapy: WFL for tasks assessed/performed Cognition: Cognition impaired   Orientation impairments: Situation Awareness: Intellectual awareness impaired, Online awareness impaired Memory impairment (select all impairments): Non-declarative long-term memory, Working memory Attention impairment (select first level of impairment): Sustained attention Executive functioning impairment (select all impairments): Organization, Reasoning, Sequencing, Problem solving OT - Cognition Comments: perseverating on wanting water . oriented with increased time. limited insight to situation. Unable to problem solve during mobility. follows most simple one step commands                 Following commands: Impaired Following commands impaired: Follows one step commands inconsistently      Cueing   Cueing Techniques: Verbal cues, Gestural cues, Tactile cues        General Comments VSS on RA    Pertinent Vitals/ Pain       Pain Assessment Pain Assessment: No/denies pain   Frequency  Min 2X/week        Progress Toward Goals  OT Goals(current goals can now be found in the  care plan section)  Progress towards OT goals: Progressing toward goals  Acute Rehab OT Goals Patient Stated Goal: to drink water  OT Goal Formulation: Patient unable to participate in goal setting Time For Goal Achievement: 03/17/24 Potential to Achieve Goals: Good ADL Goals Pt Will Perform Upper Body Dressing: with set-up;with supervision Pt Will Perform Lower Body Dressing: with set-up;with supervision Pt Will Transfer to Toilet: with set-up;with supervision Pt Will Perform Toileting - Clothing Manipulation and hygiene: with set-up;with supervision  Plan      Co-evaluation    PT/OT/SLP Co-Evaluation/Treatment: Yes Reason for Co-Treatment: Complexity of the patient's impairments (multi-system involvement);For patient/therapist safety;To address functional/ADL transfers   OT goals addressed during session: ADL's and self-care      AM-PAC OT 6 Clicks Daily Activity     Outcome Measure   Help from another person eating meals?: Total Help from another person taking care of personal grooming?: A Little Help from another person toileting, which includes using toliet, bedpan, or urinal?: A Lot Help from another person bathing (including washing, rinsing, drying)?: A Lot Help from another person to put on and taking off regular upper body clothing?: A Little Help from another person to put on and taking off regular lower body clothing?: A Lot 6 Click Score: 13    End of Session    OT Visit Diagnosis: Unsteadiness on feet (R26.81);Other abnormalities of gait and mobility (R26.89);Muscle weakness (generalized) (M62.81);Other symptoms and signs involving cognitive function   Activity Tolerance Patient tolerated treatment well   Patient Left in chair;with call bell/phone within reach;with chair alarm set;with restraints reapplied   Nurse Communication Mobility status        Time: 8864-8841 OT Time Calculation (min): 23 min  Charges: OT General Charges $OT Visit: 1  Visit OT Treatments $Therapeutic Activity: 8-22 mins  Lucie Kendall, OTR/L Acute Rehabilitation Services Office [phone] Secure Chat Communication Preferred   Lucie JONETTA Kendall 03/08/2024, 12:34 PM

## 2024-03-08 NOTE — Progress Notes (Signed)
 03/08/24 1400  Spiritual Encounters  Type of Visit Follow up  Care provided to: Patient  Reason for visit Advance directives  OnCall Visit No   Chaplain responded to consult request for Advance Care Directives.  Chaplain provided the Advance Directive packet as well as education on Advance Directives-documents an individual completes to communicate their health care directions in advance of a time when they may need them. Chaplain informed pt the documents which may be completed here in the hospital are the Living Will and Health Care Power of [place].  Chaplain informed that the Health Care Power of [place] is a legal document in which an individual names another person, their Health Care Agent, to make health care decisions when the individual is not able to make them for themselves. The Health Care Agent's function can be temporary or permanent depending on the pt's ability to make and communicate those decisions independently. Chaplain informed pt in the absence of a Health Care Power of Attorney, the state of [place]  directs health care providers to look to the following individuals in the order listed: legal guardian; an attorney?in?fact under a general power of attorney (POA) if that POA includes the right to make health care decisions; a husband or wife; a majority of parents and adult children; a majority of adult brothers and sisters; or an individual who has an established relationship with you, who is acting in good faith and who can convey your wishes.  If none of these person are available or willing to make medical decisions on a patient's behalf, the law allows the patient's doctor to make decisions for them as long as another doctor agrees with those decisions.  Chaplain also informed the patient that the Health Care agent has no decision-making authority over any affairs other than those related to his or her medical care.  The chaplain further educated the pt that a Living  Will is a legal document that allows an individual to state his or her desire not to receive life-prolonging measures in the event that they have a condition that is incurable and will result in their death in a short period of time; they are unconscious, and doctors are confident that they will not regain consciousness; and/or they have advanced dementia or other substantial and irreversible loss of mental function. The chaplain informed pt that life-prolonging measures are medical treatments that would only serve to postpone death, including breathing machines, kidney dialysis, antibiotics, artificial nutrition and hydration (tube feeding), and similar forms of treatment and that if an individual is able to express their wishes, they may also make them known without the use of a Living Will, but in the event that an individual is not able to express their wishes themselves, a Living Will allows medical providers and the pt's family and friends ensure that they are not making decisions on the pt's behalf, but rather serving as the pt's voice to convey decisions the pt has already made.  The patient is aware that the decision to create an advance directive is theirs alone and they may chose not to complete the documents or may chose to complete one portion or both.  The patient was informed that they can revoke the documents at any time by striking through them and writing void or by completing new documents, but that it is also advisable that the individual verbally notify interested parties that their wishes have changed.  They are also aware that the document must be signed in the  presence of a notary public and two witnesses and that this can be done while the patient is still admitted to the hospital or after discharge in the community. If they decide to complete Advance Directives after being discharged from the hospital, they have been advised to notify all interested parties and to provide those  documents to their physicians and loved ones in addition to bringing them to the hospital in the event of another hospitalization.  The chaplain informed the pt that if they desire to proceed with completing Advance Directive Documentation while they are still admitted, notary services are typically available at [facility] between the hours of 8:30 and 3:30 Monday-Friday.    When the patient is ready to have these documents completed, the patient should request that their nurse place a spiritual care consult and indicate that the patient is ready to have their advance directives notarized so that arrangements for witnesses and notary public can be made.  Please page spiritual care if the patient desires further education or has questions.      M.Kubra Susanna Kerry Resident [phone]

## 2024-03-08 NOTE — Consult Note (Signed)
 [facility] Followup Face-to-Face Psychiatric Evaluation   Service Date: March 08, 2024 LOS:  LOS: 6 days    Primary Psych Diagnosis   Alcohol Use Disorder, Severe Delirium   Assessment  [facility] is a 63 y.o. male admitted medically for 03/02/2024  3:56 PM for low blood pressures of unclear etiology.  Psychiatrically he carries a diagnosis of anxiety, depression and alcoholism and has a past medical history of hypertension, anemia, GERD and seizures. Psychiatry was consulted for delirium/agitation by Dr. Mennie .   His current presentation of altered mental status is most consistent with delirium. Symptoms could be multifactorial in the setting of chronic alcohol use/withdrawal vs metabolic derangements vs concern for thyroid  storm. Patients mentation is improving and he oriented to place, person and setting. Still requiring PRN PO agitation medications and hand restraints this morning. Will continue to follow patient to see improvement in agitation and mentation improves.   03/04/2024 On assessment patient was unable to cooperate with exam due to being sedated on Ativan . Patient was resting comfortably in bed in no acute distress. Agree with the primary team with continuing with CIWA protocol and symptomatic triggered Ativan  to help address possible withdrawal symptoms.  Patient had a CIWA score of as high as 27.   Will continue to follow patient while admitted and will further assess patient as somnolence improves.  Will attempt to discuss further collateral with family members also and assessment of this patient.  Dispo location will be deferred until after a formal assessment.   03/05/2024 Patient seen laying in bed this morning on my approach. He was unable to participate with interview due to sedation.  03/08/2024 Patient seen lying in bed this morning on my approach.  He was able to engage with interview and was cooperative.  Patient was alert and oriented to  person, place, setting.  Still disoriented to certain members in the room stating that his daughter was someone else.  Patient denies any suicidal ideations, homicidal ideations or auditory visualizations.  Patient unsure when his last drink was.  Per daughter she reports that he continues to wax and wane with mentation.  She reports that he is closer to his baseline today and this is the best he has looked in the hospital with interacting with people. She reports that he is at mentation issues in the context of a stroke approximately 5 years ago con-current alcohol use. Has still required restraints and PO medications for agitation.   Diagnoses:  Active Hospital problems: Principal Problem:   Hypotension Active Problems:   Graves disease   Malnutrition of moderate degree    Plan  ## Safety and Observation Level:  - Based on my clinical evaluation, I estimate the patient to be at low risk of self harm in the current setting - At this time, we recommend a routine level of observation. This decision is based on my review of the chart including patient's history and current presentation, interview of the patient, mental status examination, and consideration of suicide risk including evaluating suicidal ideation, plan, intent, suicidal or self-harm behaviors, risk factors, and protective factors. This judgment is based on our ability to directly address suicide risk, implement suicide prevention strategies and develop a safety plan while the patient is in the clinical setting. Please contact our team if there is a concern that risk level has changed.  ## Medications:  -- Continue with CIWA protocol as ordered per primary team with symptomatic triggered Ativan   -- Continue  Lexapro  20 mg daily  -- Continue Agitation medications as ordered, can consider adjustments if he continues to require more utilization  ## Medical Decision Making Capacity:  -- Not specifically assessed  ## Further Work-up:  --  Per primary team -- most recent EKG on 9/24 had QtC of 455 -- Pertinent labwork reviewed earlier this admission includes: CBC, CMP, Lipase, TSH, T4   ## Disposition:  -- TBD  ## Behavioral / Environmental:  -- Continue to monitor for withdrawals   ##Legal Status -- UTA   Thank you for this consult request. Recommendations have been communicated to the primary team.  We will follow at this time.   [name], MD   New History   Relevant Aspects of Hospital Course:  Admitted on 03/02/2024 for low blood pressure of unclear etiology. Per provider on 9/23:   In the ER, hypotensive with SBP's in the 70's.  Creatinine elevated 4.14 above baseline.  Alert and oriented x 2.  Received 2L IVF with improvement of his BP.     Non-contrast CT head revealed generalized cerebral atrophy with chronic inferior cerebellar infarcts and chronic lacunar infarcts within the pons and basal ganglia on the right.  No acute intracranial abnormality.     Per patient's family members at bedside, his wife and daughter, his mentation is at baseline.  He has had a decline in memory for years.  Patient Report:   Patient was assessed at bedside.  Upon approach patient was heavily sedated and undergoing an ultrasound.  Patient was unable to cooperate and engage in interview.  Patient was resting comfortably in bed and did not appear to be in acute distress.  Collateral information:  No one was bedside, will obtain later during admission  Psychiatric History:  03/04/2024 Unable to collect history from patient due to sedation and no one bedside for additional collateral.  Will continue to assess throughout admission  03/05/2024 Unable to collect history from patient due to sedation and no one bedside for additional collateral.  Will continue to assess throughout admission  03/08/2024 Patient seen lying in bed this morning on my approach.  He was able to engage with interview and was cooperative.  Patient was  alert and oriented to person, place, setting.  Still disoriented to certain members in the room stating that his daughter was someone else.  Patient denies any suicidal ideations, homicidal ideations or auditory visualizations.  Patient unsure when his last drink was.  Per daughter she reports that he continues to wax and wane with mentation.  She reports that he is closer to his baseline today and this is the best he has looked in the hospital with interacting with people. She reports that he is at mentation issues in the context of a stroke approximately 5 years ago con-current alcohol use. Has still required restraints and PO medications for agitation.   Family psych history: UTA   Social History:  Tobacco use: current cigar and cigarette use per EMR  Alcohol use: chronic  Drug use: None reported in EMR   Family History:  The patient's family history includes Allergic rhinitis in his sister; Asthma in his grandson; Breast cancer in his mother; Diabetes in his mother; Hypertension in his mother; Leukemia in his father; Stroke in his mother.  Medical History: Past Medical History:  Diagnosis Date  . Angio-edema   . 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 Asthma   . GERD (gastroesophageal reflux disease)   . History of blood  transfusion   . HTN (hypertension)   . Seizures (HCC)    last seizure in 2023 per patient  . Ulcer     Surgical History: Past Surgical History:  Procedure Laterality Date  . CLOSED REDUCTION NASAL FRACTURE  11/19/2011   Procedure: CLOSED REDUCTION NASAL FRACTURE;  Surgeon: Marlyce Finer, MD;  Location: [facility] OR;  Service: ENT;  Laterality: N/A;  CLOSED REDUCTION NASAL AND NASAL SEPTAL FRACTURE WITH STABILIZATION  . No surgical      Medications:   Current Facility-Administered Medications:  .   stroke: early stages of recovery book, , Does not apply, Once, Sethi, Pramod S, MD .  acetaminophen  (TYLENOL ) tablet 650 mg, 650 mg, Per Tube, Q6H PRN, Drusilla Sabas RAMAN, MD, 650 mg  at 03/07/24 2033 .  albuterol  (PROVENTIL ) (2.5 MG/3ML) 0.083% nebulizer solution 2.5 mg, 2.5 mg, Inhalation, Q6H PRN, Hall, Carole N, DO .  aspirin  chewable tablet 81 mg, 81 mg, Per Tube, Daily, Drusilla Sabas RAMAN, MD, 81 mg at 03/08/24 0931 .  atenolol  (TENORMIN ) tablet 50 mg, 50 mg, Per Tube, Daily, Drusilla, Gagan S, MD, 50 mg at 03/08/24 1103 .  azithromycin (ZITHROMAX) 500 mg in sodium chloride  0.9 % 250 mL IVPB, 500 mg, Intravenous, Q24H, Drusilla, Sabas RAMAN, MD, Last Rate: 250 mL/hr at 03/08/24 1116, 500 mg at 03/08/24 1116 .  cefTRIAXone (ROCEPHIN) 2 g in sodium chloride  0.9 % 100 mL IVPB, 2 g, Intravenous, Q24H, Reome, Earle J, RPH, Last Rate: 200 mL/hr at 03/08/24 0945, 2 g at 03/08/24 0945 .  clopidogrel (PLAVIX) tablet 75 mg, 75 mg, Per Tube, Daily, Jerri Pfeiffer, MD, 75 mg at 03/08/24 0931 .  dexamethasone  (DECADRON ) injection 4 mg, 4 mg, Intravenous, Q12H, Drusilla, Sabas RAMAN, MD, 4 mg at 03/08/24 0931 .  escitalopram  (LEXAPRO ) tablet 20 mg, 20 mg, Per Tube, Daily, Drusilla Sabas RAMAN, MD, 20 mg at 03/08/24 0933 .  feeding supplement (JEVITY 1.5 CAL/FIBER) liquid 1,000 mL, 1,000 mL, Per Tube, Continuous, Drusilla, Sabas RAMAN, MD .  feeding supplement (PROSource TF20) liquid 60 mL, 60 mL, Per Tube, Daily, Drusilla, Sabas RAMAN, MD, 60 mL at 03/08/24 0932 .  folic acid  (FOLVITE ) tablet 1 mg, 1 mg, Per Tube, Daily, Drusilla, Sabas RAMAN, MD, 1 mg at 03/08/24 0932 .  free water  200 mL, 200 mL, Per Tube, Q6H, Drusilla, Sabas RAMAN, MD, 200 mL at 03/08/24 1233 .  haloperidol  (HALDOL ) tablet 2 mg, 2 mg, Oral, Daily PRN **OR** haloperidol  lactate (HALDOL ) injection 2 mg, 2 mg, Intramuscular, Daily PRN, Jacquetta Sharlot GRADE, NP, 2 mg at 03/07/24 2256 .  heparin  injection 5,000 Units, 5,000 Units, Subcutaneous, Q8H, Hall, Carole N, DO, 5,000 Units at 03/08/24 0516 .  LORazepam  (ATIVAN ) tablet 1 mg, 1 mg, Oral, Daily PRN **OR** LORazepam  (ATIVAN ) injection 1 mg, 1 mg, Intravenous, Daily PRN, Jacquetta Sharlot GRADE, NP, 1 mg at 03/07/24 2033 .  methimazole  (TAPAZOLE )  tablet 20 mg, 20 mg, Per Tube, Daily, Drusilla Sabas RAMAN, MD, 20 mg at 03/08/24 1102 .  multivitamin with minerals tablet 1 tablet, 1 tablet, Per Tube, Daily, Drusilla Sabas RAMAN, MD, 1 tablet at 03/08/24 0931 .  nicotine (NICODERM CQ - dosed in mg/24 hours) patch 21 mg, 21 mg, Transdermal, Daily, Drusilla, Sabas RAMAN, MD, 21 mg at 03/08/24 0932 .  Oral care mouth rinse, 15 mL, Mouth Rinse, 4 times per day, Drusilla Sabas RAMAN, MD, 15 mL at 03/08/24 1229 .  Oral care mouth rinse, 15 mL, Mouth Rinse, PRN, Drusilla, Sabas RAMAN, MD .  polyethylene glycol (MIRALAX  / GLYCOLAX ) packet 17 g, 17 g, Per Tube, Daily PRN, Drusilla, Sabas RAMAN, MD .  prochlorperazine  (COMPAZINE ) injection 5 mg, 5 mg, Intravenous, Q6H PRN, Hall, Carole N, DO .  thiamine  (VITAMIN B1) injection 100 mg, 100 mg, Intravenous, Q8H, Drusilla, Gagan S, MD, 100 mg at 03/08/24 0516 .  topiramate  (TOPAMAX ) tablet 100 mg, 100 mg, Per Tube, BID, Drusilla Sabas RAMAN, MD, 100 mg at 03/08/24 0931  Allergies: Allergies  Allergen Reactions  . Citrus Swelling  . Fruit Extracts Swelling    Pt experiences swelling with most fruits  . Chlorthalidone  Other (See Comments)    Hyponatremia       Objective  Vital signs:  Temp:  [98.1 F (36.7 C)-98.8 F (37.1 C)] 98.2 F (36.8 C) (09/29 1200) Pulse Rate:  [81-96] 86 (09/29 1200) Resp:  [17-26] 17 (09/29 1200) BP: (135-161)/(82-91) 135/83 (09/29 1200) SpO2:  [95 %-98 %] 98 % (09/29 1200)  Psychiatric Specialty Exam:  Presentation  General Appearance: Appropriate for Environment; Casual  Eye Contact:Good  Speech:Slurred  Speech Volume:Normal  Handedness:No data recorded  Mood and Affect  Mood:Euthymic  Affect:Congruent; Appropriate   Thought Process  Thought Processes:Coherent  Descriptions of Associations:Intact  Orientation:Partial  Thought Content:Logical  History of Schizophrenia/Schizoaffective disorder:None Duration of Psychotic Symptoms:None  Hallucinations:Hallucinations: None   Ideas of  Reference:None  Suicidal Thoughts:Suicidal Thoughts: No   Homicidal Thoughts:Homicidal Thoughts: No    Sensorium  Memory:Immediate Fair; Recent Poor; Remote Poor  Judgment:Poor  Insight:Poor   Executive Functions  Concentration:Poor  Attention Span:Poor  Recall:Poor  Fund of Knowledge:Fair  Language:Fair   Psychomotor Activity  Psychomotor Activity:Psychomotor Activity: Normal    Assets  Assets:Social Support   Sleep  Sleep:Sleep: -- (Did not ask)  Physical Exam: Physical Exam Stable on RA Review of Systems  Psychiatric/Behavioral:  Negative for depression, hallucinations, substance abuse and suicidal ideas. The patient is not nervous/anxious.    Blood pressure 135/83, pulse 86, temperature 98.2 F (36.8 C), temperature source Axillary, resp. rate 17, height 5' 4 (1.626 m), weight 66.9 kg, SpO2 98%. Body mass index is 25.32 kg/m.

## 2024-03-08 NOTE — Progress Notes (Addendum)
 Nutrition Follow-up  DOCUMENTATION CODES:   Severe malnutrition in context of chronic illness  INTERVENTION:  Continuous tube feeding via Cortrak: Jevity 1.5 at 50 ml/hr  (1200 ml per day) Proosource TF20 Daily 200 ml FWF Q4H    Provides 1880 kcal, 96 gm protein, 912 ml free water  daily (2112 ml TF + FWF)   Monitor magnesium, potassium, and phosphorus daily for at least 3 days, MD to replete as needed,  Monitor for diet advancement    *Of note pt has allergies to fruit extracts and swelling - need to confirm with family   NUTRITION DIAGNOSIS:   Severe Malnutrition related to chronic illness (ETOH abuse/withdrawl) as evidenced by energy intake < or equal to 75% for > or equal to 1 month, moderate fat depletion, moderate muscle depletion (a 17 lb, 10.6% wt loss in 6 months.). - New dx  GOAL:   Patient will meet greater than or equal to 90% of their needs - Progressing with tube feeds   MONITOR:   PO intake, Diet advancement, TF tolerance, Weight trends, Supplement acceptance  REASON FOR ASSESSMENT:   Consult Assessment of nutrition requirement/status  ASSESSMENT:   63 y.o. male with PMH of HTN, HLD,  hx of CVA, chronic anxiety/depression, ETOH abuse (last drink 1 week PTA), admitted for hypotension of unclear etiology.   9/26 - Cortrak placed, tube feeds started  9/28 - MRI brain, showed acute infarcts in the right basal ganglia, left corona radiator and left medial temporal lobe. NPO.  Pt appears to be more awake today, able to track with eyes and respond to some questions was placed NPO due to AMS and new findings of acute infarcts in the right basal ganglia, left corona radiator and left medial temporal lobe. Has mitts. Tube feeds running, pt tolerating.  Daughter at bedside, reports pt has not been eating well at all in the last 2 moths due to quitting drinking beer cold [place].Has only been eating little debbie snacks cakes, drinking barely any fluids and only 4  Boost in total in the last 2 months. Per weight history, pt's weight has been declining since March of this year, going from 160 lbs to now 143 lbs, a 17 lb, 10.6% wt loss in 6 months. Which is clinically significant for timeframe. On exam pt presents with moderate muscle and fat deficits. With new findings from daughter pt now qualifies with serve malnutrition.   Speech to see later today but suspect even if diet advanced pt will need supplemental nutrition until po intake is adequate. Pt reports he wants water  today. Has no teeth but was eating regular foods PTA.    Admit weight: 68 kg Current weight: 66.9 kg   Average Meal Intake: NPO  Nutritionally Relevant Medications: Scheduled Meds:  feeding supplement (PROSource TF20)  60 mL Per Tube Daily   folic acid   1 mg Per Tube Daily   free water   200 mL Per Tube Q6H   multivitamin with minerals  1 tablet Per Tube Daily   thiamine  (VITAMIN B1) injection  100 mg Intravenous Q8H   topiramate   100 mg Per Tube BID   Continuous Infusions:  azithromycin 500 mg (03/08/24 1116)   cefTRIAXone (ROCEPHIN)  IV 2 g (03/08/24 0945)   feeding supplement (JEVITY 1.5 CAL/FIBER)     Labs Reviewed: Potassium 3.3 BUN 32 CBG ranges from 128-163 mg/dL over the last 24 hours HgbA1c 6.8  NUTRITION - FOCUSED PHYSICAL EXAM:   Flowsheet Row Most Recent Value  Orbital  Region Moderate depletion  Upper Arm Region Moderate depletion  Thoracic and Lumbar Region Moderate depletion  Buccal Region Moderate depletion  Temple Region Severe depletion  Clavicle Bone Region Moderate depletion  Clavicle and Acromion Bone Region Moderate depletion  Scapular Bone Region Moderate depletion  Dorsal Hand Unable to assess  Patellar Region Moderate depletion  Anterior Thigh Region Moderate depletion  Posterior Calf Region Moderate depletion  Edema (RD Assessment) None  Hair Reviewed  Eyes Reviewed  Mouth Reviewed  [No teeth]  Skin Reviewed  Nails Reviewed      Diet Order:   Diet Order             Diet NPO time specified  Diet effective now                   EDUCATION NEEDS:   Not appropriate for education at this time  Skin:  Skin Assessment: Reviewed RN Assessment  Last BM:  9/28  Height:   Ht Readings from Last 1 Encounters:  03/02/24 5' 4 (1.626 m)    Weight:   Wt Readings from Last 1 Encounters:  03/07/24 66.9 kg    BMI:  Body mass index is 25.32 kg/m.  Estimated Nutritional Needs:   Kcal:  1800-2000 kcal  Protein:  90-110 gm  Fluid:  >1.8L/day   Olivia Kenning, RD Registered Dietitian  See Amion for more information

## 2024-03-08 NOTE — Progress Notes (Signed)
 [facility]  PROGRESS NOTE  Casey Warren FMW:[id number] DOB: 12/07/1960 DOA: 03/02/2024 PCP: Celestia Rosaline SQUIBB, NP   Brief HPI:    63 y.o. male with PMH of  hypertension, hyperlipidemia, chronic anxiety/depression, alcoholism, who presents to the ER, referred by his PCP due to low blood pressures of unclear etiology. Per family members at bedside he has cut down on alcohol consumption.  Last alcoholic drink was about a week ago. In the ER, hypotensive with SBP's in the 70's.  Creatinine elevated 4.14 above baseline.  Alert and oriented x 2.  Received 2L IVF with improvement of his BP.   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  No acute intracranial abnormality.He has had a decline in memory for years. CT chest abd pelvis:No acute findings in the chest, abdomen or pelvis. Scattered bilateral pulmonary nodules measuring up to 5 mm. Non-contrast chest CT can be considered in 12 months if patient is high-risk.    Assessment/Plan:    Acute encephalopathy/agitation/delirium Alcohol abuse, w/ withdrawals Recent cognitive decline Failure to thrive Severe malnutrition: last Etoh with wine bottle on 02/26/24 per daughter,normally heavy drinker-he had weaned himself from beer for a month. As per daughter for 2 and half month fatigued and for a month not eating well and has lost weight,having memory loss for 3-4 years forgetful/repetitive.He has not been himself recently.  Mentation worse from baseline past day or two. Psych consulted. Cont CIWA Ativan  for withdrawal, managing hyperthyroidism, continue supportive care Keep on strict fall precaution,  sitter if needed. Now on Posey. Completed high-dose thiamine , currently on thiamine  injection 100 mg IV every 8 hours - After discussion with neurology Dr. Michaela, ordered MRI brain, EEG B12 level and ammonia level to complete  the workup for encephalopathy - MRI brain showed acute infarct in right basal ganglia, left corona radiator, left medial temporal lobe  Acute infarct in the right basal ganglia, left corona radiata and left medial temporal lobe. -MRI brain obtained this morning showed acute infarcts in the right basal ganglia, left corona radiata and left medial temporal lobe. -Will keep n.p.o., patient is getting tube feeds via core track feeding tube - Neurology consulted -Currently on aspirin  and Plavix for 3 weeks and then Plavix alone -Recommend 30-day heart monitor after discharge -CIR consulted   Hyperthyroidism/concern for hypothyroid crisis Relative adrenal insufficiency w/ low cortisol: Tsh < 0.1, ft4 3.25 on add on, free T3 elevated 9.8 some (9/25) Picture appears mixed with alcohol withdrawal and hyperthyroidism,  Cosyntropin  test reassuring  Thyroid -stimulating hemoglobin is elevated at 5.08 Given patient is seriously ill,-discussed with outpatient [facility] Dr Mercie: US  thryoid is resutled- moderately heterogenous thyroid  gland- likely thyroiditis. Received PTU initially will change to methimazole  for easier dosing 20 daily>  TRAb is elevated at 8.45.  TS, will  need to go home on methimazole   Started atenolol  50 daily, taper off Decadron  over few days  SIRS -Patient tachypneic, tachycardic, low-grade fever -Chest x-ray showed multifocal pneumonia versus pulmonary edema -Started on Rocephin and Zithromax -Follow blood culture results -UA was clear - Lactic acid 1.2   Acute kidney injury -Resolved  Hypernatremia -Sodium has improved to 140 - Change free water  to 200 every 6 hours  Hypomagnesemia - Magnesium 1.6 - Will replace magnesium and follow BMP in am     Ambulatory dysfunction Fall in the hospital: No acute trauma on the CT head 9/25 am.Keep on  for precaution sitter/restraint if needed PT OT eval completed as advised skilled nursing facility once stable    Chronic anxiety/depression Cont  home regimen.  Psych seeing the patient as well   Lung nodules: Fu in 12 m w/ CT chest if high risk   Medications     aspirin   81 mg Per Tube Daily   atenolol   50 mg Per Tube Daily   clopidogrel  75 mg Per Tube Daily   dexamethasone  (DECADRON ) injection  4 mg Intravenous Q12H   escitalopram   20 mg Per Tube Daily   feeding supplement (PROSource TF20)  60 mL Per Tube Daily   folic acid   1 mg Per Tube Daily   free water   200 mL Per Tube Q4H   heparin   5,000 Units Subcutaneous Q8H   methIMAzole   20 mg Per Tube Daily   multivitamin with minerals  1 tablet Per Tube Daily   nicotine  21 mg Transdermal Daily   mouth rinse  15 mL Mouth Rinse 4 times per day   thiamine  (VITAMIN B1) injection  100 mg Intravenous Q8H   topiramate   100 mg Per Tube BID     Data Reviewed:   CBG:  Recent Labs  Lab 03/07/24 0551 03/07/24 1215 03/07/24 1725 03/08/24 0007 03/08/24 0513  GLUCAP 163* 128* 164* 163* 162*    SpO2: 97 %    Vitals:   03/07/24 1613 03/07/24 1920 03/07/24 2254 03/08/24 0309  BP: (!) 153/86 139/88 (!) 161/91 (!) 149/87  Pulse: 82     Resp: (!) 22 (!) 26    Temp: 98.2 F (36.8 C) 98.4 F (36.9 C) 98.8 F (37.1 C) 98.1 F (36.7 C)  TempSrc: Axillary Axillary Axillary Axillary  SpO2: 98% 96%  97%  Weight:      Height:          Data Reviewed:  Basic Metabolic Panel: Recent Labs  Lab 03/03/24 0451 03/03/24 1603 03/04/24 0640 03/05/24 0237 03/06/24 0615 03/07/24 0606 03/07/24 1505 03/08/24 0431  NA 139 142 144 147* 151*  --  140  --   K 3.3* 4.2 4.0 4.0 3.4* 3.4* 3.3* 3.8  CL 110 113* 113* 115* 119*  --  113*  --   CO2 19* 16* 17* 17* 17*  --  17*  --   GLUCOSE 114* 113* 128* 165* 170*  --  167*  --   BUN 86* 71* 56* 48* 35*  --  32*  --   CREATININE 2.63* 1.97* 1.59* 1.53* 1.35*  --  1.14  --   CALCIUM  8.5* 9.4 9.6 9.5 9.3  --  8.5*  --   MG 2.0  --   --   --  1.4* 1.9  --  1.6*  PHOS 5.0*  --   --   --  3.7 3.6   --  4.3    CBC: Recent Labs  Lab 03/02/24 1634 03/02/24 2108 03/04/24 0640 03/05/24 0237 03/06/24 0615 03/07/24 0606 03/08/24 0431  WBC 6.2   < > 6.2 8.1 12.8* 12.7* 10.8*  NEUTROABS 4.5  --   --   --   --   --   --   HGB 14.2   < > 12.3* 11.9* 11.9* 13.2 11.3*  HCT 41.5   < > 36.4* 35.1* 36.3* 38.9* 33.3*  MCV 88.3   < > 88.3 88.0 88.8 88.0 87.2  PLT 276   < > 254 258 245 227 210   < > = values in  this interval not displayed.    LFT Recent Labs  Lab 03/02/24 1634 03/06/24 0615  AST 39 39  ALT 56* 75*  ALKPHOS 93 107  BILITOT 1.6* 0.6  PROT 7.1 6.3*  ALBUMIN 3.4* 2.8*     Antibiotics: Anti-infectives (From admission, onward)    Start     Dose/Rate Route Frequency Ordered Stop   03/08/24 0900  cefTRIAXone (ROCEPHIN) 2 g in sodium chloride  0.9 % 100 mL IVPB        2 g 200 mL/hr over 30 Minutes Intravenous Every 24 hours 03/07/24 1004     03/07/24 1100  cefTRIAXone (ROCEPHIN) 1 g in sodium chloride  0.9 % 100 mL IVPB        1 g 200 mL/hr over 30 Minutes Intravenous  Once 03/07/24 1005 03/07/24 1522   03/07/24 1000  azithromycin (ZITHROMAX) 500 mg in sodium chloride  0.9 % 250 mL IVPB        500 mg 250 mL/hr over 60 Minutes Intravenous Every 24 hours 03/07/24 0813     03/07/24 0900  cefTRIAXone (ROCEPHIN) 1 g in sodium chloride  0.9 % 100 mL IVPB  Status:  Discontinued        1 g 200 mL/hr over 30 Minutes Intravenous Every 24 hours 03/07/24 0813 03/07/24 1004        DVT prophylaxis: Heparin   Code Status: Full code  Family Communication: Discussed with granddaughter at bedside   CONSULTS    Subjective   Patient seen and examined, denies any complaints.   Objective    Physical Examination:  Appears in no acute distress S1-S2, regular, no murmur auscultated Lungs clear to auscultation bilaterally Neuro-alert, oriented to self and place only, moving all extremities   Status is: Inpatient:             Sabas GORMAN Brod   [facility] If 7PM-7AM, please contact night-coverage at [email or web address], Office  [phone]   03/08/2024, 8:10 AM  LOS: 6 days

## 2024-03-08 NOTE — Progress Notes (Shared)

## 2024-03-08 NOTE — Evaluation (Signed)
 Clinical/Bedside Swallow Evaluation Patient Details  Name: Casey Warren MRN: [id number] Date of Birth: [birth year]  Today's Date: 03/08/2024 Time: SLP Start Time (ACUTE ONLY): 1429 SLP Stop Time (ACUTE ONLY): 1444 SLP Time Calculation (min) (ACUTE ONLY): 15 min  Past Medical History:  Past Medical History:  Diagnosis Date   Angio-edema    Anxiety    related to surgery   Asthma    GERD (gastroesophageal reflux disease)    History of blood transfusion    HTN (hypertension)    Seizures (HCC)    last seizure in 2023 per patient   Ulcer    Past Surgical History:  Past Surgical History:  Procedure Laterality Date   CLOSED REDUCTION NASAL FRACTURE  11/19/2011   Procedure: CLOSED REDUCTION NASAL FRACTURE;  Surgeon: Marlyce Finer, MD;  Location: [facility] OR;  Service: ENT;  Laterality: N/A;  CLOSED REDUCTION NASAL AND NASAL SEPTAL FRACTURE WITH STABILIZATION   No surgical     HPI:  63 yo male referred from PCP 9/23 for hypotension. Initial CTH negative, though MRI 9/28 shows acute infarcts with restricted diffusion in the R basal ganglia, L coronia radiata, and L medial temporal lobe. Previously on  a soft diet with Cortrak placed 9/26 after pt's intake was variable secondary to mentation/EtOH withdrawal. Made NPO after MRI findings and SLP consulted 9/29. PMH includes seizures, PUD, GERD, HTN, anemia, alcohol use, anxiety    Assessment / Plan / Recommendation  Clinical Impression  Pt alert and oriented x3, sitting upright in bed. He is missing dentition but states this did not limit intake PTA. One cough was noted after initial sip of thin liquids, though this did not recur as pt quickly drank 2 cups of water  and completed the 3 oz water  test. Oral transit was efficient with purees and solids. Will start with Dys 3 solids and thin liquids, ensuring that he is supervised for all PO intake. RN reports concern for fluctuating mentation so hold POs if felt he is not adequately alert. SLP will  f/u. SLP Visit Diagnosis: Dysphagia, unspecified (R13.10)    Aspiration Risk  Mild aspiration risk    Diet Recommendation Dysphagia 3 (Mech soft);Thin liquid    Liquid Administration via: Cup;Straw Medication Administration: Whole meds with liquid Supervision: Staff to assist with self feeding;Full supervision/cueing for compensatory strategies Compensations: Slow rate;Small sips/bites Postural Changes: Seated upright at 90 degrees;Remain upright for at least 30 minutes after po intake    Other  Recommendations Oral Care Recommendations: Oral care BID     Assistance Recommended at Discharge    Functional Status Assessment Patient has had a recent decline in their functional status and demonstrates the ability to make significant improvements in function in a reasonable and predictable amount of time.  Frequency and Duration min 2x/week  2 weeks       Prognosis Prognosis for improved oropharyngeal function: Good Barriers to Reach Goals: Cognitive deficits      Swallow Study   General HPI: 63 yo male referred from PCP 9/23 for hypotension. Initial CTH negative, though MRI 9/28 shows acute infarcts with restricted diffusion in the R basal ganglia, L coronia radiata, and L medial temporal lobe. Previously on  a soft diet with Cortrak placed 9/26 after pt's intake was variable secondary to mentation/EtOH withdrawal. Made NPO after MRI findings and SLP consulted 9/29. PMH includes seizures, PUD, GERD, HTN, anemia, alcohol use, anxiety Type of Study: Bedside Swallow Evaluation Previous Swallow Assessment: none in chart Diet Prior to this Study:  NPO;Cortrak/Small bore NG tube Temperature Spikes Noted: No Respiratory Status: Room air History of Recent Intubation: No Behavior/Cognition: Alert;Cooperative Oral Cavity Assessment: Within Functional Limits Oral Care Completed by SLP: No Oral Cavity - Dentition: Missing dentition;Poor condition Vision: Functional for  self-feeding Self-Feeding Abilities: Able to feed self Patient Positioning: Upright in bed Baseline Vocal Quality: Normal Volitional Cough: Strong Volitional Swallow: Able to elicit    Oral/Motor/Sensory Function Overall Oral Motor/Sensory Function: Within functional limits   Ice Chips Ice chips: Not tested   Thin Liquid Thin Liquid: Impaired Presentation: Straw Pharyngeal  Phase Impairments: Cough - Delayed    Nectar Thick Nectar Thick Liquid: Not tested   Honey Thick Honey Thick Liquid: Not tested   Puree Puree: Within functional limits Presentation: Spoon;Self Fed   Solid     Solid: Within functional limits Presentation: Self Fed      Damien Blumenthal, M.A., CCC-SLP Speech Language Pathology, Acute Rehabilitation Services  Secure Chat preferred [phone]  03/08/2024,3:45 PM

## 2024-03-09 ENCOUNTER — Inpatient Hospital Stay (HOSPITAL_COMMUNITY)

## 2024-03-09 DIAGNOSIS — I6389 Other cerebral infarction: Secondary | ICD-10-CM | POA: Diagnosis not present

## 2024-03-09 DIAGNOSIS — I959 Hypotension, unspecified: Secondary | ICD-10-CM | POA: Diagnosis not present

## 2024-03-09 DIAGNOSIS — N179 Acute kidney failure, unspecified: Secondary | ICD-10-CM | POA: Diagnosis not present

## 2024-03-09 DIAGNOSIS — E785 Hyperlipidemia, unspecified: Secondary | ICD-10-CM | POA: Diagnosis not present

## 2024-03-09 DIAGNOSIS — I639 Cerebral infarction, unspecified: Secondary | ICD-10-CM | POA: Diagnosis not present

## 2024-03-09 DIAGNOSIS — E0500 Thyrotoxicosis with diffuse goiter without thyrotoxic crisis or storm: Secondary | ICD-10-CM | POA: Diagnosis not present

## 2024-03-09 DIAGNOSIS — R29707 NIHSS score 7: Secondary | ICD-10-CM | POA: Diagnosis not present

## 2024-03-09 DIAGNOSIS — I6350 Cerebral infarction due to unspecified occlusion or stenosis of unspecified cerebral artery: Secondary | ICD-10-CM | POA: Diagnosis not present

## 2024-03-09 LAB — POTASSIUM: Potassium: 2.9 mmol/L — ABNORMAL LOW (ref 3.5–5.1)

## 2024-03-09 LAB — MAGNESIUM: Magnesium: 1.7 mg/dL (ref 1.7–2.4)

## 2024-03-09 LAB — GLUCOSE, CAPILLARY
Glucose-Capillary: 110 mg/dL — ABNORMAL HIGH (ref 70–99)
Glucose-Capillary: 127 mg/dL — ABNORMAL HIGH (ref 70–99)
Glucose-Capillary: 161 mg/dL — ABNORMAL HIGH (ref 70–99)
Glucose-Capillary: 98 mg/dL (ref 70–99)

## 2024-03-09 LAB — PHOSPHORUS: Phosphorus: 2.7 mg/dL (ref 2.5–4.6)

## 2024-03-09 MED ORDER — ENSURE PLUS HIGH PROTEIN PO LIQD
237.0000 mL | Freq: Two times a day (BID) | ORAL | Status: DC
  Administered 2024-03-09 – 2024-03-16 (×12): 237 mL via ORAL

## 2024-03-09 MED ORDER — POTASSIUM CHLORIDE 10 MEQ/100ML IV SOLN
INTRAVENOUS | Status: AC
  Administered 2024-03-09: 10 meq via INTRAVENOUS
  Filled 2024-03-09: qty 100

## 2024-03-09 MED ORDER — JEVITY 1.5 CAL/FIBER PO LIQD
1000.0000 mL | ORAL | Status: DC
  Administered 2024-03-09: 1000 mL
  Filled 2024-03-09 (×2): qty 1000

## 2024-03-09 MED ORDER — POTASSIUM CHLORIDE 10 MEQ/100ML IV SOLN
10.0000 meq | INTRAVENOUS | Status: AC
  Administered 2024-03-09 (×2): 10 meq via INTRAVENOUS
  Filled 2024-03-09 (×2): qty 100

## 2024-03-09 NOTE — Progress Notes (Signed)
 Physical Therapy Treatment Patient Details Name: Casey Warren MRN: [id number] DOB: Mar 22, 1961 Today's Date: 03/09/2024   History of Present Illness Pt is 63 yo with hypotension referred from PCP on 9/23. 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 MRI 9/28 shows Acute infarcts with restricted diffusion in the R BG, L corona radiata, and L medial temporal lobe. PMH significant of seizures, PUD, GERD, hypertension, anemia, alcohol use, anxiety.    PT Comments  Pt awake and alert upon arrival to room, motivated to progress OOB but states he is scared given posterior bias. Pt with improvement in posture in both sitting and standing today, only has posterior bias with challenge (initial standing, head turns, direction changes). Pt overall requiring light steadying assist for hallway gait with RW at very slow cadence, typically pt is independent with mobility. PT continuing to recommend intensive post-acute therapies. PT to continue to follow.      If plan is discharge home, recommend the following: Assist for transportation;Help with stairs or ramp for entrance;Supervision due to cognitive status;A lot of help with walking and/or transfers;A lot of help with bathing/dressing/bathroom   Can travel by private vehicle        Equipment Recommendations  None recommended by PT (tbd)    Recommendations for Other Services       Precautions / Restrictions Precautions Precautions: Fall Recall of Precautions/Restrictions: Impaired Precaution/Restrictions Comments: cortrak (not running during session) Restrictions Weight Bearing Restrictions Per Provider Order: No     Mobility  Bed Mobility Overal bed mobility: Needs Assistance Bed Mobility: Supine to Sit, Sit to Supine     Supine to sit: Min assist, HOB elevated Sit to supine: Min assist, HOB elevated   General  bed mobility comments: assist for trunk elevation/lowering. pt able to lift LEs into and out of bed with increased time    Transfers Overall transfer level: Needs assistance Equipment used: Rolling walker (2 wheels) Transfers: Sit to/from Stand Sit to Stand: Min assist           General transfer comment: assist for rise and steady, min posterior bias but pt-corrected today. stand x2, from EOB and recliner.    Ambulation/Gait Ambulation/Gait assistance: Min assist Gait Distance (Feet): 75 Feet Assistive device: Rolling walker (2 wheels) Gait Pattern/deviations: Step-through pattern, Decreased stride length, Trunk flexed, Leaning posteriorly Gait velocity: decr Gait velocity interpretation: <1.31 ft/sec, indicative of household ambulator   General Gait Details: assist for steadying for x2 posterior LOB, cues for proximity to RW, increasing step length and gait speed   Stairs             Wheelchair Mobility     Tilt Bed    Modified Rankin (Stroke Patients Only) Modified Rankin (Stroke Patients Only) Pre-Morbid Rankin Score: No significant disability Modified Rankin: Moderately severe disability     Balance Overall balance assessment: Needs assistance Sitting-balance support: No upper extremity supported, Feet supported Sitting balance-Leahy Scale: Fair Sitting balance - Comments: close CGA, posterior bias with any challenge Postural control: Posterior lean Standing balance support: During functional activity, Reliant on assistive device for balance, Bilateral upper extremity supported Standing balance-Leahy Scale: Poor                              Communication Communication Communication: No apparent difficulties  Cognition Arousal: Alert Behavior During Therapy: [facility]  for tasks assessed/performed   PT - Cognitive impairments: Sequencing, Safety/Judgement, Problem solving                         Following commands:  Impaired Following commands impaired: Follows one step commands inconsistently    Cueing Cueing Techniques: Verbal cues, Gestural cues, Tactile cues  Exercises      General Comments        Pertinent Vitals/Pain Pain Assessment Pain Assessment: No/denies pain    Home Living                          Prior Function            PT Goals (current goals can now be found in the care plan section) Acute Rehab PT Goals PT Goal Formulation: With patient/family Time For Goal Achievement: 03/17/24 Potential to Achieve Goals: Good Progress towards PT goals: Progressing toward goals    Frequency    Min 2X/week      PT Plan      Co-evaluation              AM-PAC PT 6 Clicks Mobility   Outcome Measure  Help needed turning from your back to your side while in a flat bed without using bedrails?: A Lot Help needed moving from lying on your back to sitting on the side of a flat bed without using bedrails?: A Lot Help needed moving to and from a bed to a chair (including a wheelchair)?: A Lot Help needed standing up from a chair using your arms (e.g., wheelchair or bedside chair)?: A Lot Help needed to walk in hospital room?: Total Help needed climbing 3-5 steps with a railing? : Total 6 Click Score: 10    End of Session Equipment Utilized During Treatment: Gait belt Activity Tolerance: Patient limited by fatigue Patient left: with call bell/phone within reach;in bed;with bed alarm set;with family/visitor present Nurse Communication: Mobility status PT Visit Diagnosis: Unsteadiness on feet (R26.81);Other abnormalities of gait and mobility (R26.89);Muscle weakness (generalized) (M62.81)     Time: 8889-8862 PT Time Calculation (min) (ACUTE ONLY): 27 min  Charges:    $Gait Training: 8-22 mins $Therapeutic Activity: 8-22 mins PT General Charges $$ ACUTE PT VISIT: 1 Visit                     Johana RAMAN, PT DPT Acute Rehabilitation Services Secure Chat  Preferred  Office [phone]    Ethyn Schetter E Johna 03/09/2024, 1:55 PM

## 2024-03-09 NOTE — Progress Notes (Signed)
 [facility]  PROGRESS NOTE  Casey Warren FMW:[id number] DOB: [birth year] DOA: 03/02/2024 PCP: Celestia Rosaline SQUIBB, NP   Brief HPI:    63 y.o. male with PMH of  hypertension, hyperlipidemia, chronic anxiety/depression, alcoholism, who presents to the ER, referred by his PCP due to low blood pressures of unclear etiology. Per family members at bedside he has cut down on alcohol consumption.  Last alcoholic drink was about a week ago. In the ER, hypotensive with SBP's in the 70's.  Creatinine elevated 4.14 above baseline.  Alert and oriented x 2.  Received 2L IVF with improvement of his BP.   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  No acute intracranial abnormality.He has had a decline in memory for years. CT chest abd pelvis:No acute findings in the chest, abdomen or pelvis. Scattered bilateral pulmonary nodules measuring up to 5 mm. Non-contrast chest CT can be considered in 12 months if patient is high-risk.    Assessment/Plan:    Acute encephalopathy/agitation/delirium Alcohol abuse, w/ withdrawals Recent cognitive decline - Significantly improved Last Etoh with wine bottle on 02/26/24 per daughter,normally heavy drinker-he had weaned himself from beer for a month. As per daughter for 2 and half month fatigued and for a month not eating well and has lost weight,having memory loss for 3-4 years forgetful/repetitive.He has not been himself recently.  Mentation worse from baseline past day or two. Psych consulted. Cont CIWA Ativan  for withdrawal, managing hyperthyroidism, continue supportive care Keep on strict fall precaution,  sitter if needed. Now on Posey. Completed high-dose thiamine , currently on thiamine  injection 100 mg IV every 8 hours - After discussion with neurology Dr. Michaela, ordered MRI brain, EEG B12 level and ammonia level to complete the workup for  encephalopathy - MRI brain showed acute infarct in right basal ganglia, left corona radiator, left medial temporal lobe  Acute infarct in the right basal ganglia, left corona radiata and left medial temporal lobe. -MRI brain obtained this morning showed acute infarcts in the right basal ganglia, left corona radiata and left medial temporal lobe. -Will keep n.p.o., patient is getting tube feeds via core track feeding tube - Neurology consulted -Currently on aspirin  and Plavix for 3 weeks and then Plavix alone -Recommend 30-day heart monitor after discharge -CIR consulted   Hyperthyroidism/concern for hypothyroid crisis Relative adrenal insufficiency w/ low cortisol: Tsh < 0.1, ft4 3.25 on add on, free T3 elevated 9.8 some (9/25) Picture appears mixed with alcohol withdrawal and hyperthyroidism,  Cosyntropin  test reassuring  Thyroid -stimulating hemoglobin is elevated at 5.08 Given patient is seriously ill,-discussed with outpatient [facility] Endocrine Dr Mercie: US  thryoid is resutled- moderately heterogenous thyroid  gland- likely thyroiditis. Received PTU initially will change to methimazole  for easier dosing 20 daily>  TRAb is elevated at 8.45.  TS, will  need to go home on methimazole   Started atenolol  50 daily, taper off Decadron  over few days  Multifocal pneumonia -Patient tachypneic, tachycardic, low-grade fever -Chest x-ray showed multifocal pneumonia versus pulmonary edema -Started on Rocephin and Zithromax -Follow blood culture results -UA was clear - Lactic acid 1.2   Acute kidney injury -Resolved  Hypernatremia -Sodium has improved to 140 - Change free water  to 200 every 6 hours  Hypomagnesemia - Replete  Hypokalemia - Potassium was 2.9, will replace potassium and follow BMP in am.   Ambulatory dysfunction Fall in the hospital: No acute trauma on the CT head 9/25 am.Keep  on for precaution sitter/restraint if needed PT OT eval completed as advised skilled  nursing facility once stable   Chronic anxiety/depression Cont  home regimen.  Psych seeing the patient as well   Lung nodules: Fu in 12 m w/ CT chest if high risk   Medications     aspirin   81 mg Oral Daily   atenolol   50 mg Oral Daily   atorvastatin   40 mg Oral Daily   clopidogrel  75 mg Oral Daily   dexamethasone  (DECADRON ) injection  4 mg Intravenous Q24H   escitalopram   20 mg Oral Daily   feeding supplement (PROSource TF20)  60 mL Per Tube Daily   folic acid   1 mg Oral Daily   free water   200 mL Per Tube Q6H   heparin   5,000 Units Subcutaneous Q8H   methIMAzole   20 mg Oral Daily   multivitamin with minerals  1 tablet Oral Daily   nicotine  21 mg Transdermal Daily   mouth rinse  15 mL Mouth Rinse 4 times per day   thiamine  (VITAMIN B1) injection  100 mg Intravenous Q8H   topiramate   100 mg Oral BID     Data Reviewed:   CBG:  Recent Labs  Lab 03/08/24 0007 03/08/24 0513 03/08/24 1204 03/08/24 2340 03/09/24 0519  GLUCAP 163* 162* 131* 134* 127*    SpO2: 98 %    Vitals:   03/08/24 1907 03/08/24 2000 03/08/24 2351 03/09/24 0432  BP: (!) 147/84 (!) 153/89 (!) 158/85 (!) 134/90  Pulse: 88  87 89  Resp: 18 (!) 33 (!) 25 (!) 26  Temp: 98.2 F (36.8 C)  98.6 F (37 C) 98.5 F (36.9 C)  TempSrc: Oral  Oral Oral  SpO2: 95%  99% 98%  Weight:    68 kg  Height:          Data Reviewed:  Basic Metabolic Panel: Recent Labs  Lab 03/03/24 0451 03/03/24 1603 03/04/24 0640 03/05/24 0237 03/06/24 0615 03/07/24 0606 03/07/24 1505 03/08/24 0431 03/09/24 0539  NA 139 142 144 147* 151*  --  140  --   --   K 3.3* 4.2 4.0 4.0 3.4* 3.4* 3.3* 3.8 2.9*  CL 110 113* 113* 115* 119*  --  113*  --   --   CO2 19* 16* 17* 17* 17*  --  17*  --   --   GLUCOSE 114* 113* 128* 165* 170*  --  167*  --   --   BUN 86* 71* 56* 48* 35*  --  32*  --   --   CREATININE 2.63* 1.97* 1.59* 1.53* 1.35*  --  1.14  --   --   CALCIUM  8.5* 9.4 9.6 9.5 9.3  --  8.5*  --   --   MG 2.0   --   --   --  1.4* 1.9  --  1.6* 1.7  PHOS 5.0*  --   --   --  3.7 3.6  --  4.3 2.7    CBC: Recent Labs  Lab 03/02/24 1634 03/02/24 2108 03/04/24 0640 03/05/24 0237 03/06/24 0615 03/07/24 0606 03/08/24 0431  WBC 6.2   < > 6.2 8.1 12.8* 12.7* 10.8*  NEUTROABS 4.5  --   --   --   --   --   --   HGB 14.2   < > 12.3* 11.9* 11.9* 13.2 11.3*  HCT 41.5   < > 36.4* 35.1* 36.3* 38.9* 33.3*  MCV 88.3   < >  88.3 88.0 88.8 88.0 87.2  PLT 276   < > 254 258 245 227 210   < > = values in this interval not displayed.    LFT Recent Labs  Lab 03/02/24 1634 03/06/24 0615  AST 39 39  ALT 56* 75*  ALKPHOS 93 107  BILITOT 1.6* 0.6  PROT 7.1 6.3*  ALBUMIN 3.4* 2.8*     Antibiotics: Anti-infectives (From admission, onward)    Start     Dose/Rate Route Frequency Ordered Stop   03/08/24 0900  cefTRIAXone (ROCEPHIN) 2 g in sodium chloride  0.9 % 100 mL IVPB        2 g 200 mL/hr over 30 Minutes Intravenous Every 24 hours 03/07/24 1004     03/07/24 1100  cefTRIAXone (ROCEPHIN) 1 g in sodium chloride  0.9 % 100 mL IVPB        1 g 200 mL/hr over 30 Minutes Intravenous  Once 03/07/24 1005 03/07/24 1522   03/07/24 1000  azithromycin (ZITHROMAX) 500 mg in sodium chloride  0.9 % 250 mL IVPB        500 mg 250 mL/hr over 60 Minutes Intravenous Every 24 hours 03/07/24 0813     03/07/24 0900  cefTRIAXone (ROCEPHIN) 1 g in sodium chloride  0.9 % 100 mL IVPB  Status:  Discontinued        1 g 200 mL/hr over 30 Minutes Intravenous Every 24 hours 03/07/24 0813 03/07/24 1004        DVT prophylaxis: Heparin   Code Status: Full code  Family Communication: Discussed with patient's daughter at bedside   CONSULTS    Subjective   Patient seen, complains of having loose bowel movements.   Objective    Physical Examination:  Appears in no acute distress Alert, oriented x 3, able to carry out conversation, following commands Lungs are clear to auscultation bilaterally Abdomen is soft,  nontender Extremities no edema    Status is: Inpatient:             Casey Warren   [facility] If 7PM-7AM, please contact night-coverage at [email or web address], Office  [phone]   03/09/2024, 8:15 AM  LOS: 7 days

## 2024-03-09 NOTE — Progress Notes (Addendum)
 STROKE TEAM PROGRESS NOTE   INTERIM HISTORY/SUBJECTIVE  Patient lying in bed, family and RN at bedside.  Continues to have bilateral mittens on. Oriented to self, place.   PENDING LE DVT US  to complete Stroke Workup   CBC    Component Value Date/Time   WBC 10.8 (H) 03/08/2024 0431   RBC 3.82 (L) 03/08/2024 0431   HGB 11.3 (L) 03/08/2024 0431   HGB 17.4 07/29/2023 1336   HCT 33.3 (L) 03/08/2024 0431   HCT 50.4 07/29/2023 1336   PLT 210 03/08/2024 0431   PLT 292 07/29/2023 1336   MCV 87.2 03/08/2024 0431   MCV 93 07/29/2023 1336   MCH 29.6 03/08/2024 0431   MCHC 33.9 03/08/2024 0431   RDW 13.0 03/08/2024 0431   RDW 13.2 07/29/2023 1336   LYMPHSABS 1.0 03/02/2024 1634   LYMPHSABS 2.0 07/29/2023 1336   MONOABS 0.6 03/02/2024 1634   EOSABS 0.1 03/02/2024 1634   EOSABS 0.2 07/29/2023 1336   BASOSABS 0.0 03/02/2024 1634   BASOSABS 0.1 07/29/2023 1336    BMET    Component Value Date/Time   NA 140 03/07/2024 1505   NA 140 07/29/2023 1336   K 2.9 (L) 03/09/2024 0539   CL 113 (H) 03/07/2024 1505   CO2 17 (L) 03/07/2024 1505   GLUCOSE 167 (H) 03/07/2024 1505   BUN 32 (H) 03/07/2024 1505   BUN 6 (L) 07/29/2023 1336   CREATININE 1.14 03/07/2024 1505   CALCIUM  8.5 (L) 03/07/2024 1505   EGFR 92 07/29/2023 1336   GFRNONAA >60 03/07/2024 1505    IMAGING past 24 hours ECHOCARDIOGRAM COMPLETE Result Date: 03/08/2024    ECHOCARDIOGRAM REPORT   Patient Name:   Casey Warren Date of Exam: 03/08/2024 Medical Rec #:  [id number]      Height:       64.0 in Accession #:    [id number]     Weight:       147.5 lb Date of Birth:  December 16, 1960      BSA:          1.719 m Patient Age:    63 years       BP:           149/87 mmHg Patient Gender: M              HR:           85 bpm. Exam Location:  Inpatient Procedure: 2D Echo, Cardiac Doppler and Color Doppler (Both Spectral and Color            Flow Doppler were utilized during procedure). Indications:    Stroke I63.9  History:        Patient has no  prior history of Echocardiogram examinations.                 Stroke; Risk Factors:Current Smoker. H/O Hypotension, Alchohol                 abuse.  Sonographer:    BERNARDA ROCKS Referring Phys: [id number] [facility] KHALIQDINA IMPRESSIONS  1. Left ventricular ejection fraction, by estimation, is 60 to 65%. The left ventricle has normal function. The left ventricle has no regional wall motion abnormalities. Left ventricular diastolic parameters were normal.  2. Right ventricular systolic function is normal. The right ventricular size is normal.  3. The mitral valve is normal in structure. No evidence of mitral valve regurgitation. No evidence of mitral stenosis.  4. The aortic valve is normal in structure. Aortic valve  regurgitation is not visualized. No aortic stenosis is present.  5. The inferior vena cava is normal in size with greater than 50% respiratory variability, suggesting right atrial pressure of 3 mmHg. FINDINGS  Left Ventricle: Left ventricular ejection fraction, by estimation, is 60 to 65%. The left ventricle has normal function. The left ventricle has no regional wall motion abnormalities. The left ventricular internal cavity size was normal in size. There is  no left ventricular hypertrophy. Left ventricular diastolic parameters were normal. Right Ventricle: The right ventricular size is normal. No increase in right ventricular wall thickness. Right ventricular systolic function is normal. Left Atrium: Left atrial size was normal in size. Right Atrium: Right atrial size was normal in size. Pericardium: There is no evidence of pericardial effusion. Mitral Valve: The mitral valve is normal in structure. No evidence of mitral valve regurgitation. No evidence of mitral valve stenosis. MV peak gradient, 3.7 mmHg. The mean mitral valve gradient is 2.0 mmHg. Tricuspid Valve: The tricuspid valve is normal in structure. Tricuspid valve regurgitation is not demonstrated. No evidence of tricuspid stenosis. Aortic  Valve: The aortic valve is normal in structure. Aortic valve regurgitation is not visualized. No aortic stenosis is present. Aortic valve mean gradient measures 3.0 mmHg. Aortic valve peak gradient measures 8.0 mmHg. Aortic valve area, by VTI measures 4.60 cm. Pulmonic Valve: The pulmonic valve was normal in structure. Pulmonic valve regurgitation is not visualized. No evidence of pulmonic stenosis. Aorta: The aortic root is normal in size and structure. Venous: The inferior vena cava is normal in size with greater than 50% respiratory variability, suggesting right atrial pressure of 3 mmHg. IAS/Shunts: The atrial septum is grossly normal.  LEFT VENTRICLE PLAX 2D LVIDd:         3.90 cm      Diastology LVIDs:         2.80 cm      LV e' medial:    7.62 cm/s LV PW:         1.00 cm      LV E/e' medial:  10.9 LV IVS:        1.10 cm      LV e' lateral:   8.81 cm/s LVOT diam:     2.30 cm      LV E/e' lateral: 9.4 LV SV:         104 LV SV Index:   60 LVOT Area:     4.15 cm  LV Volumes (MOD) LV vol d, MOD A2C: 112.0 ml LV vol d, MOD A4C: 138.0 ml LV vol s, MOD A2C: 35.7 ml LV vol s, MOD A4C: 44.1 ml LV SV MOD A2C:     76.3 ml LV SV MOD A4C:     138.0 ml LV SV MOD BP:      87.6 ml RIGHT VENTRICLE             IVC RV Basal diam:  2.40 cm     IVC diam: 1.80 cm RV S prime:     20.90 cm/s TAPSE (M-mode): 2.4 cm RVSP:           9.1 mmHg LEFT ATRIUM             Index        RIGHT ATRIUM           Index LA diam:        3.90 cm 2.27 cm/m   RA Pressure: 3.00 mmHg LA Vol (A2C):   43.2 ml 25.13  ml/m  RA Area:     13.10 cm LA Vol (A4C):   42.5 ml 24.73 ml/m  RA Volume:   30.20 ml  17.57 ml/m LA Biplane Vol: 43.7 ml 25.42 ml/m  AORTIC VALVE                    PULMONIC VALVE AV Area (Vmax):    4.07 cm     PV Vmax:          1.03 m/s AV Area (Vmean):   4.42 cm     PV Peak grad:     4.3 mmHg AV Area (VTI):     4.60 cm     PR End Diast Vel: 1.35 msec AV Vmax:           141.00 cm/s AV Vmean:          78.700 cm/s AV VTI:             0.226 m AV Peak Grad:      8.0 mmHg AV Mean Grad:      3.0 mmHg LVOT Vmax:         138.00 cm/s LVOT Vmean:        83.800 cm/s LVOT VTI:          0.250 m LVOT/AV VTI ratio: 1.11  AORTA Ao Root diam: 3.80 cm Ao Asc diam:  3.80 cm MITRAL VALVE               TRICUSPID VALVE MV Area (PHT): 2.87 cm    TR Peak grad:   6.1 mmHg MV Area VTI:   4.15 cm    TR Vmax:        123.00 cm/s MV Peak grad:  3.7 mmHg    Estimated RAP:  3.00 mmHg MV Mean grad:  2.0 mmHg    RVSP:           9.1 mmHg MV Vmax:       0.96 m/s MV Vmean:      64.6 cm/s   SHUNTS MV Decel Time: 264 msec    Systemic VTI:  0.25 m MV E velocity: 83.20 cm/s  Systemic Diam: 2.30 cm MV A velocity: 80.80 cm/s MV E/A ratio:  1.03 Morene Brownie Electronically signed by Morene Brownie Signature Date/Time: 03/08/2024/3:57:04 PM    Final     Vitals:   03/08/24 2000 03/08/24 2351 03/09/24 0432 03/09/24 0809  BP: (!) 153/89 (!) 158/85 (!) 134/90 138/84  Pulse:  87 89 94  Resp: (!) 33 (!) 25 (!) 26 20  Temp:  98.6 F (37 C) 98.5 F (36.9 C) 98 F (36.7 C)  TempSrc:  Oral Oral Oral  SpO2:  99% 98% 97%  Weight:   68 kg   Height:         PHYSICAL EXAM General:  Alert, well-nourished, well-developed patient in no acute distress Psych:  Mood and affect appropriate for situation CV: Regular rate and rhythm on monitor Respiratory:  Regular, unlabored respirations on room air GI: Abdomen soft and nontender   NEURO:  Mental Status: AA&O to self, place.  Able to tell me that he's in the hospital and that its 2025. He states he is 32 when asked his age, which has been consistent for his response throughout this admission. Follows simple commands Poor attention and perseveration, but improved since admission.   Speech/Language: speech is without aphasia.  Dysarthria able to name, slight paucity of speech continues  Cranial Nerves:  II: PERRL.  Visual fields blinks to threat bilaterally III, IV, VI: EOMI. Eyelids elevate symmetrically.  Tracks V:  Sensation is intact to light touch and symmetrical to face.  VII: Mild left facial droop VIII: hearing intact to voice. IX, X: Palate elevates symmetrically. Phonation is normal.  KP:Dynloizm shrug 5/5. XII: tongue is midline without fasciculations. Motor: Bilateral uppers 4+/5, BLE 4-/5 with drift.  Tone: is normal and bulk is normal Sensation- Intact to light touch bilaterally. Extinction absent to light touch to DSS.   Coordination: FTN intact bilaterally, HKS: no ataxia in BLE.No drift.  Gait- deferred  Most Recent NIH  1a Level of Conscious.:  1b LOC Questions: 1 1c LOC Commands:  2 Best Gaze:  3 Visual:  4 Facial Palsy: 1 5a Motor Arm - left:  5b Motor Arm - Right:  6a Motor Leg - Left: 2 6b Motor Leg - Right: 2 7 Limb Ataxia:  8 Sensory:  9 Best Language:  10 Dysarthria: 1 11 Extinct. and Inatten.:  TOTAL: 7   ASSESSMENT/PLAN  Mr. Casey Warren is a 63 y.o. male with history of  prior CVA, HTN, HLD, etoh abuse, anxiety/depression who presented to ED after being referred by PCP d/t hypotension with unknown etiology.   On admission to ED, SBP was in the 70s, Cr 4.14. He was bolused with 2L with subsequent improvement in BP. CXR concerning for pneumonia versus pulmonary edema.  He had continued worsening mental status and MRI was obtained and revealed acute infarcts  Stroke:  small right basal ganglia, left corona radiata, left medial temporal lobe infarcts, etiology:   likely synchronized small vessel, however, can not completely rule out embolic source CT head No acute abnormality. Atrophy. chronic inferior cerebellar infarcts and chronic lacunar infarcts within the pons and basal ganglia on the right. CTA head & neck No LVO, right V4 moderate stenosis, bilateral ICA siphon mild to moderate stenosis.  MRI  Acute infarcts with restricted diffusion in the right basal ganglia, left corona radiata, and left medial temporal lobe. EEG 9/28 Rare left temporal sharp waves  indicative of increased epileptogenic potential in those regions LE venous US  no DVT 2D Echo EF 60-65% Recommend 30 day heart monitor as outpt LDL 78 HgbA1c 6.8 UDS + benzo (hospital meds) VTE prophylaxis - hep Sq aspirin  81 mg daily prior to admission, continue aspirin  81 mg daily and clopidogrel 75 mg daily for 3 weeks and then Plavix  alone. Therapy recommendations:  CIR Disposition:  pending   AKI Dehydration Anorexia Decreased intake since stopped drinking alcohol Creatinine 4.14-1.97-1.53-1.14 Status post IV bolus Continue tube feeding and free water  Q4H CBG and SSI while on TF  ? Pneumonia CXR Diffuse interstitial prominence could reflect edema or atypical infection. Continue azithromycin and Rocephin  Hx of hypertension Hypotension on presentation Home meds: Amlodipine  10 mg, clonidine  0.1 mg, HCTZ 12.5 mg, losartan 40 mg Stable now On atenolol  BP low on presentation due to dehydration Blood pressure goal normotensive   Hyperlipidemia Home meds: Atorvastatin  20 mg  LDL 78, goal < 70 AST/ALT 60/43--60/24 - close monitoring Increase lipitor to 40 mg  Continue statin at discharge  Tobacco Abuse Patient smokes 4 cigars /day       Ready to quit? Yes Nicotine replacement therapy provided  ETOH Abuse Heavy drinker per daughter  ETOH use, alcohol level <15, advised to drink no more than 1 drink(s) a day CIWA  protocol  Thiamine , folate, multivitamin daily UDS positive for benzos  Ready to quit? Yes TOC consult for cessation placed  Dysphagia Patient has post-stroke dysphagia SLP consulted NPO  Continue TF with FW  Other stroke risk factors Advanced age History of stroke, per daughter it occurred 5 years ago, no details, MRI showed chronic inferior cerebellar infarcts and chronic lacunar infarcts within the pons and basal ganglia on the right. History of drug abuse  Other Active Problems Hyperthyroidism/concern for hyperthyroid crisis management  per primary team, on steroids and methimazole  Leukocytosis WBC 12.8-12.7-10.8 GERD Anxiety on Lexapro  Asthma  Hospital day # 7   Pt seen by Neuro NP/APP and later by MD. Note/plan to be edited by MD as needed.    Rocky JAYSON Likes, DNP [facility] Please use AMION for contact information & EPIC for messaging.  ATTENDING NOTE: I reviewed above note and agree with the assessment and plan.   No acute event overnight. LE venous doppler neg for DVT. Neuro stable. Continue DAPT and statin, recommend 30 day monitoring as outpt. Pending CIR.   For detailed assessment and plan, please refer to above as I have made changes wherever appropriate.   Neurology will sign off. Please call with questions. Pt will follow up with Dr. Gregg at [facility] in about 4 weeks. Thanks for the consult.   Ary Cummins, MD PhD Stroke Neurology 03/09/2024 3:18 PM     To contact Stroke Continuity provider, please refer to [email or web address]. After hours, contact General Neurology

## 2024-03-09 NOTE — Consult Note (Signed)
 [name] Psychiatry Followup Face-to-Face Psychiatric Evaluation   Service Date: March 09, 2024 LOS:  LOS: 7 days    Primary Psych Diagnosis   Alcohol Use Disorder, Severe, (chronic)  Delirium (resolving)   Assessment  Casey Warren is a 63 y.o. male admitted medically for 03/02/2024  3:56 PM for low blood pressures of unclear etiology.  Psychiatrically he carries a diagnosis of anxiety, depression and alcoholism and has a past medical history of hypertension, anemia, GERD and seizures. Psychiatry was consulted for delirium/agitation by Dr. Mennie .   His current presentation of altered mental status is most consistent with delirium. Symptoms could be multifactorial in the setting of chronic alcohol use/withdrawal vs metabolic derangements vs concern for thyroid  storm. Patients mentation is improving and he oriented to place, person and setting.   03/04/2024 On assessment patient was unable to cooperate with exam due to being sedated on Ativan . Patient was resting comfortably in bed in no acute distress. Agree with the primary team with continuing with CIWA protocol and symptomatic triggered Ativan  to help address possible withdrawal symptoms.  Patient had a CIWA score of as high as 27.   Will continue to follow patient while admitted and will further assess patient as somnolence improves.  Will attempt to discuss further collateral with family members also and assessment of this patient.  Dispo location will be deferred until after a formal assessment.   03/05/2024 Patient seen laying in bed this morning on my approach. He was unable to participate with interview due to sedation.  03/08/2024 Patient seen lying in bed this morning on my approach.  He was able to engage with interview and was cooperative.  Patient was alert and oriented to person, place, setting.  Still disoriented to certain members in the room stating that his daughter was someone else.  Patient denies any suicidal  ideations, homicidal ideations or auditory visualizations.  Patient unsure when his last drink was.  Per daughter she reports that he continues to wax and wane with mentation.  She reports that he is closer to his baseline today and this is the best he has looked in the hospital with interacting with people. She reports that he is at mentation issues in the context of a stroke approximately 5 years ago con-current alcohol use. Has still required restraints and PO medications for agitation.   9/30/205  Patient seen lying in bed this morning on my approach.  Daughter at bedside and provides additional history.  Patient out of mitts this morning and not requiring p.o. agitation protocol for the last 24 hours.  Patient is disorientation continues to improve and was only disoriented to age, oriented to person place and setting.  Patient reports that he has been trying to work on his alcohol consumption and quit in August.  Patient reports that he feels adequate support with family and aware of recommendations for consideration of outpatient resources to help with sobriety journey.  Patient is not agitated, aggressive, denies suicidal ideation homicidal ideation auditory visual hallucinations.  Given improvement with agitation, we will sign off with recommendations of outpatient substance resources.  Diagnoses:  Active Hospital problems: Principal Problem:   Hypotension Active Problems:   Graves disease   Malnutrition of moderate degree   Protein-calorie malnutrition, severe   Cerebrovascular accident (CVA) (HCC)    Plan  ## Safety and Observation Level:  - Based on my clinical evaluation, I estimate the patient to be at low risk of self harm in the current  setting - At this time, we recommend a routine level of observation. This decision is based on my review of the chart including patient's history and current presentation, interview of the patient, mental status examination, and consideration of  suicide risk including evaluating suicidal ideation, plan, intent, suicidal or self-harm behaviors, risk factors, and protective factors. This judgment is based on our ability to directly address suicide risk, implement suicide prevention strategies and develop a safety plan while the patient is in the clinical setting. Please contact our team if there is a concern that risk level has changed.  ## Medications:  -- Discontinue CIWA protocol  -- Continue Lexapro  20 mg daily  -- Continue Agitation medications as ordered, can consider adjustments if he continues to require more utilization   ## Medical Decision Making Capacity:  -- Not specifically assessed  ## Further Work-up:  -- Per primary team -- most recent EKG on 9/24 had QtC of 455 -- Pertinent labwork reviewed earlier this admission includes: CBC, CMP, Lipase, TSH, T4   ## Disposition:  -- No contraindications for discharge from a psychiatric standpoint, patient does not currently meet any involuntary commitment for inpatient psychiatric hospitalization.  -- Substance resources   ## Behavioral / Environmental:  -- Continue to monitor for withdrawals   ##Legal Status -- No ongoing legal issues  Thank you for this consult request. Recommendations have been communicated to the primary team.  We will sign off at this time.   [name], MD   New History   Relevant Aspects of Hospital Course:  Admitted on 03/02/2024 for low blood pressure of unclear etiology. Per provider on 9/23:   In the ER, hypotensive with SBP's in the 70's.  Creatinine elevated 4.14 above baseline.  Alert and oriented x 2.  Received 2L IVF with improvement of his BP.     Non-contrast CT head revealed generalized cerebral atrophy with chronic inferior cerebellar infarcts and chronic lacunar infarcts within the pons and basal ganglia on the right.  No acute intracranial abnormality.     Per patient's family members at bedside, his wife and daughter, his  mentation is at baseline.  He has had a decline in memory for years.  Patient Report:   Patient was assessed at bedside.  Upon approach patient was heavily sedated and undergoing an ultrasound.  Patient was unable to cooperate and engage in interview.  Patient was resting comfortably in bed and did not appear to be in acute distress.  Collateral information:  03/09/2024 Daughter bedside States today continues to improve and he is getting closer to his baseline.  She notes some intermittent confusion and would like some outpatient resources for alcohol abuse.  Psychiatric History:  03/04/2024 Unable to collect history from patient due to sedation and no one bedside for additional collateral.  Will continue to assess throughout admission  03/05/2024 Unable to collect history from patient due to sedation and no one bedside for additional collateral.  Will continue to assess throughout admission  03/08/2024 Patient seen lying in bed this morning on my approach.  He was able to engage with interview and was cooperative.  Patient was alert and oriented to person, place, setting.  Still disoriented to certain members in the room stating that his daughter was someone else.  Patient denies any suicidal ideations, homicidal ideations or auditory visualizations.  Patient unsure when his last drink was.  Per daughter she reports that he continues to wax and wane with mentation.  She reports that he is  closer to his baseline today and this is the best he has looked in the hospital with interacting with people. She reports that he is at mentation issues in the context of a stroke approximately 5 years ago con-current alcohol use. Has still required restraints and PO medications for agitation.   03/09/2024  Patient was seen lying in bed upon my approach.  Daughter was bedside to be contributive towards history.  Patient alert and oriented to person, place, disoriented to age.  Patient denies any significant  withdrawal symptoms or agitation overnight.  Patient is friendly, cooperative, engaged in interview with a euthymic affect.  Patient reports that he attempted to stop drinking behavior and believes he was successful.  He denies any need for additional resources and believes that it is intrinsic motivation and got that will be able to help him maintain his sobriety journey.  He denies suicidal ideations, auditory visual hallucinations.  Family psych history: UTA   Social History:  Tobacco use: current cigar and cigarette use per EMR  Alcohol use: chronic  Drug use: None reported in EMR   Family History:  The patient's family history includes Allergic rhinitis in his sister; Asthma in his grandson; Breast cancer in his mother; Diabetes in his mother; Hypertension in his mother; Leukemia in his father; Stroke in his mother.  Medical History: Past Medical History:  Diagnosis Date   Angio-edema    Anxiety    related to surgery   Asthma    GERD (gastroesophageal reflux disease)    History of blood transfusion    HTN (hypertension)    Seizures (HCC)    last seizure in 2023 per patient   Ulcer     Surgical History: Past Surgical History:  Procedure Laterality Date   CLOSED REDUCTION NASAL FRACTURE  11/19/2011   Procedure: CLOSED REDUCTION NASAL FRACTURE;  Surgeon: Marlyce Finer, MD;  Location: [facility] OR;  Service: ENT;  Laterality: N/A;  CLOSED REDUCTION NASAL AND NASAL SEPTAL FRACTURE WITH STABILIZATION   No surgical      Medications:   Current Facility-Administered Medications:    acetaminophen  (TYLENOL ) tablet 650 mg, 650 mg, Oral, Q6H PRN, Drusilla, Sabas RAMAN, MD   albuterol  (PROVENTIL ) (2.5 MG/3ML) 0.083% nebulizer solution 2.5 mg, 2.5 mg, Inhalation, Q6H PRN, Hall, Carole N, DO   aspirin  chewable tablet 81 mg, 81 mg, Oral, Daily, Drusilla, Sabas RAMAN, MD, 81 mg at 03/09/24 0846   atenolol  (TENORMIN ) tablet 50 mg, 50 mg, Oral, Daily, Drusilla, Sabas RAMAN, MD, 50 mg at 03/09/24 0848   atorvastatin   (LIPITOR) tablet 40 mg, 40 mg, Oral, Daily, Jerri Pfeiffer, MD, 40 mg at 03/09/24 0847   azithromycin (ZITHROMAX) 500 mg in sodium chloride  0.9 % 250 mL IVPB, 500 mg, Intravenous, Q24H, Drusilla, Sabas RAMAN, MD, Last Rate: 250 mL/hr at 03/09/24 1113, 500 mg at 03/09/24 1113   cefTRIAXone (ROCEPHIN) 2 g in sodium chloride  0.9 % 100 mL IVPB, 2 g, Intravenous, Q24H, Reome, Earle J, RPH, Last Rate: 200 mL/hr at 03/09/24 1025, 2 g at 03/09/24 1025   clopidogrel (PLAVIX) tablet 75 mg, 75 mg, Oral, Daily, Drusilla, Gagan S, MD, 75 mg at 03/09/24 0840   dexamethasone  (DECADRON ) injection 4 mg, 4 mg, Intravenous, Q24H, Drusilla, Gagan S, MD, 4 mg at 03/09/24 9150   escitalopram  (LEXAPRO ) tablet 20 mg, 20 mg, Oral, Daily, Drusilla, Gagan S, MD, 20 mg at 03/09/24 0846   feeding supplement (ENSURE PLUS HIGH PROTEIN) liquid 237 mL, 237 mL, Oral, BID BM, Drusilla, Gagan S,  MD   feeding supplement (JEVITY 1.5 CAL/FIBER) liquid 1,000 mL, 1,000 mL, Per Tube, Q24H, Drusilla, Sabas RAMAN, MD   feeding supplement (PROSource TF20) liquid 60 mL, 60 mL, Per Tube, Daily, Drusilla, Sabas RAMAN, MD, 60 mL at 03/09/24 0849   folic acid  (FOLVITE ) tablet 1 mg, 1 mg, Oral, Daily, Drusilla, Sabas RAMAN, MD, 1 mg at 03/09/24 9152   haloperidol  (HALDOL ) tablet 2 mg, 2 mg, Oral, Daily PRN **OR** haloperidol  lactate (HALDOL ) injection 2 mg, 2 mg, Intramuscular, Daily PRN, Jacquetta Sharlot GRADE, NP, 2 mg at 03/07/24 2256   heparin  injection 5,000 Units, 5,000 Units, Subcutaneous, Q8H, Hall, Carole N, DO, 5,000 Units at 03/09/24 9361   LORazepam  (ATIVAN ) tablet 1 mg, 1 mg, Oral, Daily PRN **OR** LORazepam  (ATIVAN ) injection 1 mg, 1 mg, Intravenous, Daily PRN, Jacquetta Sharlot GRADE, NP, 1 mg at 03/07/24 2033   methimazole  (TAPAZOLE ) tablet 20 mg, 20 mg, Oral, Daily, Drusilla, Sabas RAMAN, MD, 20 mg at 03/09/24 0848   multivitamin with minerals tablet 1 tablet, 1 tablet, Oral, Daily, Drusilla, Sabas RAMAN, MD, 1 tablet at 03/09/24 0846   nicotine (NICODERM CQ - dosed in mg/24 hours) patch 21 mg, 21 mg, Transdermal,  Daily, Drusilla, Sabas RAMAN, MD, 21 mg at 03/09/24 9149   Oral care mouth rinse, 15 mL, Mouth Rinse, 4 times per day, Drusilla Sabas RAMAN, MD, 15 mL at 03/09/24 0800   Oral care mouth rinse, 15 mL, Mouth Rinse, PRN, Drusilla, Sabas RAMAN, MD   polyethylene glycol (MIRALAX  / GLYCOLAX ) packet 17 g, 17 g, Oral, Daily PRN, Drusilla, Sabas RAMAN, MD   potassium chloride  10 mEq in 100 mL IVPB, 10 mEq, Intravenous, Q1 Hr x 4, Lama, Gagan S, MD, Last Rate: 100 mL/hr at 03/09/24 0842, 10 mEq at 03/09/24 9157   prochlorperazine  (COMPAZINE ) injection 5 mg, 5 mg, Intravenous, Q6H PRN, Hall, Carole N, DO   thiamine  (VITAMIN B1) injection 100 mg, 100 mg, Intravenous, Q8H, Drusilla, Gagan S, MD, 100 mg at 03/09/24 9362   topiramate  (TOPAMAX ) tablet 100 mg, 100 mg, Oral, BID, Drusilla Sabas RAMAN, MD, 100 mg at 03/09/24 0844  Allergies: Allergies  Allergen Reactions   Banana Anaphylaxis    Per family   Citrus Swelling   Fruit Extracts Swelling    Pt experiences swelling with most fruits   Chlorthalidone  Other (See Comments)    Hyponatremia       Objective  Vital signs:  Temp:  [98 F (36.7 C)-98.6 F (37 C)] 98 F (36.7 C) (09/30 1112) Pulse Rate:  [87-94] 88 (09/30 1112) Resp:  [18-33] 20 (09/30 1112) BP: (134-158)/(84-96) 139/90 (09/30 1112) SpO2:  [95 %-99 %] 99 % (09/30 1112) Weight:  [68 kg] 68 kg (09/30 0432)  Psychiatric Specialty Exam:  Presentation  General Appearance: Appropriate for Environment; Casual  Eye Contact:Good  Speech:Slurred  Speech Volume:Normal  Handedness:No data recorded  Mood and Affect  Mood:Euthymic  Affect:Congruent; Appropriate   Thought Process  Thought Processes:Coherent  Descriptions of Associations:Intact  Orientation:Partial  Thought Content:Logical  History of Schizophrenia/Schizoaffective disorder:None Duration of Psychotic Symptoms:None  Hallucinations:Hallucinations: None   Ideas of Reference:None  Suicidal Thoughts:Suicidal Thoughts: No   Homicidal  Thoughts:Homicidal Thoughts: No    Sensorium  Memory:Immediate Fair; Recent Poor; Remote Poor  Judgment:Poor  Insight:Poor   Executive Functions  Concentration:Poor  Attention Span:Poor  Recall:Poor  Fund of Knowledge:Fair  Language:Fair   Psychomotor Activity  Psychomotor Activity:Psychomotor Activity: Normal    Assets  Assets:Social Support   Sleep  Sleep:Sleep: -- (  Did not ask)  Physical Exam: Physical Exam Stable on RA Review of Systems  Psychiatric/Behavioral:  Negative for depression, hallucinations, substance abuse and suicidal ideas. The patient is not nervous/anxious.    Blood pressure (!) 139/90, pulse 88, temperature 98 F (36.7 C), temperature source Oral, resp. rate 20, height 5' 4 (1.626 m), weight 68 kg, SpO2 99%. Body mass index is 25.73 kg/m.

## 2024-03-09 NOTE — Plan of Care (Signed)
  Problem: Clinical Measurements: Goal: Ability to maintain clinical measurements within normal limits will improve Outcome: Progressing Goal: Will remain free from infection Outcome: Progressing Goal: Respiratory complications will improve Outcome: Progressing   Problem: Nutrition: Goal: Adequate nutrition will be maintained Outcome: Progressing   Problem: Coping: Goal: Level of anxiety will decrease Outcome: Progressing   Problem: Elimination: Goal: Will not experience complications related to bowel motility Outcome: Progressing

## 2024-03-09 NOTE — Progress Notes (Signed)
 Nutrition Brief Note  Pt much more awake today, had 50% of an omelet with sausge this morning. Change to nocturnal tube feeds and start caloire count. If pt does well, feeding tube can be removed tomorrow. Pt willing to try Ensures, family may bring in protein shakes from home as well. Discussed plan with MD, RN, pt, and daughter.   INTERVENTION:  Nocturnal tube feeding via Cortrak from 1800-0600: Jevity 1.5 at 60 ml/hr x 12 hours (720 ml per day) Proosource TF20 Daily   Provides 1160 kcal, 65 gm protein, 912 ml free water  daily (2112 ml TF + FWF)   Encourage PO intake - ON dysphagia 3 diet, thin liquids for poor dentition Room service with assist, pt cannot read menu Ensure Plus High Protein po BID, each supplement provides 350 kcal and 20 grams of protein Mighty Shake TID with meals, each supplement provides 330 kcals and 9 grams of protein Monitor magnesium, potassium, and phosphorus daily for at least 3 days, MD to replete as needed  Calorie count    *Of note pt has allergies to fruit extracts and swelling - 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    NUTRITION DIAGNOSIS:  Severe Malnutrition related to chronic illness (ETOH abuse/withdrawl) as evidenced by energy intake < or equal to 75% for > or equal to 1 month, moderate fat depletion, moderate muscle depletion (a 17 lb, 10.6% wt loss in 6 months.). - New dx   GOAL:    Patient will meet greater than or equal to 90% of their needs - Progressing with tube feeds     Olivia Kenning, RD Registered Dietitian  See Amion for more information

## 2024-03-10 DIAGNOSIS — E861 Hypovolemia: Secondary | ICD-10-CM | POA: Diagnosis not present

## 2024-03-10 LAB — MAGNESIUM
Magnesium: 1.4 mg/dL — ABNORMAL LOW (ref 1.7–2.4)
Magnesium: 2 mg/dL (ref 1.7–2.4)

## 2024-03-10 LAB — COMPREHENSIVE METABOLIC PANEL WITH GFR
ALT: 267 U/L — ABNORMAL HIGH (ref 0–44)
AST: 121 U/L — ABNORMAL HIGH (ref 15–41)
Albumin: 2.6 g/dL — ABNORMAL LOW (ref 3.5–5.0)
Alkaline Phosphatase: 128 U/L — ABNORMAL HIGH (ref 38–126)
Anion gap: 6 (ref 5–15)
BUN: 19 mg/dL (ref 8–23)
CO2: 17 mmol/L — ABNORMAL LOW (ref 22–32)
Calcium: 8.1 mg/dL — ABNORMAL LOW (ref 8.9–10.3)
Chloride: 111 mmol/L (ref 98–111)
Creatinine, Ser: 0.96 mg/dL (ref 0.61–1.24)
GFR, Estimated: >60 mL/min (ref >60)
Glucose, Bld: 109 mg/dL — ABNORMAL HIGH (ref 70–99)
Potassium: 4.3 mmol/L (ref 3.5–5.1)
Sodium: 134 mmol/L — ABNORMAL LOW (ref 135–145)
Total Bilirubin: 0.5 mg/dL (ref 0.0–1.2)
Total Protein: 5.4 g/dL — ABNORMAL LOW (ref 6.5–8.1)

## 2024-03-10 LAB — POTASSIUM: Potassium: 3.9 mmol/L (ref 3.5–5.1)

## 2024-03-10 LAB — HEPATIC FUNCTION PANEL
ALT: 154 U/L — ABNORMAL HIGH (ref 0–44)
AST: 68 U/L — ABNORMAL HIGH (ref 15–41)
Albumin: 2.3 g/dL — ABNORMAL LOW (ref 3.5–5.0)
Alkaline Phosphatase: 112 U/L (ref 38–126)
Bilirubin, Direct: <0.1 mg/dL (ref 0.0–0.2)
Total Bilirubin: 0.4 mg/dL (ref 0.0–1.2)
Total Protein: 5 g/dL — ABNORMAL LOW (ref 6.5–8.1)

## 2024-03-10 LAB — PHOSPHORUS
Phosphorus: 1.5 mg/dL — ABNORMAL LOW (ref 2.5–4.6)
Phosphorus: 1.9 mg/dL — ABNORMAL LOW (ref 2.5–4.6)

## 2024-03-10 LAB — GLUCOSE, CAPILLARY
Glucose-Capillary: 117 mg/dL — ABNORMAL HIGH (ref 70–99)
Glucose-Capillary: 356 mg/dL — ABNORMAL HIGH (ref 70–99)

## 2024-03-10 MED ORDER — K PHOS MONO-SOD PHOS DI & MONO 155-852-130 MG PO TABS
500.0000 mg | ORAL_TABLET | Freq: Three times a day (TID) | ORAL | Status: DC
  Administered 2024-03-10 (×3): 500 mg
  Filled 2024-03-10 (×4): qty 2

## 2024-03-10 MED ORDER — AZITHROMYCIN 500 MG PO TABS
500.0000 mg | ORAL_TABLET | Freq: Once | ORAL | Status: AC
  Administered 2024-03-11: 500 mg via ORAL
  Filled 2024-03-10: qty 1

## 2024-03-10 MED ORDER — MAGNESIUM SULFATE 4 GM/100ML IV SOLN
4.0000 g | Freq: Once | INTRAVENOUS | Status: AC
  Administered 2024-03-10: 4 g via INTRAVENOUS
  Filled 2024-03-10: qty 100

## 2024-03-10 MED ORDER — SACCHAROMYCES BOULARDII 250 MG PO CAPS
250.0000 mg | ORAL_CAPSULE | Freq: Two times a day (BID) | ORAL | Status: DC
  Administered 2024-03-10 – 2024-03-16 (×12): 250 mg via ORAL
  Filled 2024-03-10 (×13): qty 1

## 2024-03-10 NOTE — Evaluation (Signed)
 Speech Language Pathology Evaluation Patient Details Name: Casey Warren MRN: [id number] DOB: 1960-06-11 Today's Date: 03/10/2024 Time: 9061-9047 SLP Time Calculation (min) (ACUTE ONLY): 14 min  Problem List:  Patient Active Problem List   Diagnosis Date Noted   Protein-calorie malnutrition, severe 03/08/2024   Cerebrovascular accident (CVA) (HCC) 03/08/2024   Malnutrition of moderate degree 03/07/2024   Graves disease 03/04/2024   Hypotension 03/02/2024   Low back pain 05/20/2023   Angioedema 07/01/2022   Anxiety 07/01/2022   Hyponatremia 11/30/2019   AKI (acute kidney injury) 11/30/2019   Hypotension due to medication 11/30/2019   Seizures (HCC) 11/30/2019   Abnormal EKG 04/06/2017   Essential hypertension 04/06/2017   ANEMIA 04/28/2008   Alcohol abuse 04/28/2008   TOBACCO ABUSE 04/28/2008   SUBSTANCE ABUSE, MULTIPLE 04/28/2008   Dental caries 04/28/2008   GERD 04/28/2008   PEPTIC ULCER DISEASE, HELICOBACTER PYLORI POSITIVE 04/28/2008   Past Medical History:  Past Medical History:  Diagnosis Date   Angio-edema    Anxiety    related to surgery   Asthma    GERD (gastroesophageal reflux disease)    History of blood transfusion    HTN (hypertension)    Seizures (HCC)    last seizure in 2023 per patient   Ulcer    Past Surgical History:  Past Surgical History:  Procedure Laterality Date   CLOSED REDUCTION NASAL FRACTURE  11/19/2011   Procedure: CLOSED REDUCTION NASAL FRACTURE;  Surgeon: Marlyce Finer, MD;  Location: [facility] OR;  Service: ENT;  Laterality: N/A;  CLOSED REDUCTION NASAL AND NASAL SEPTAL FRACTURE WITH STABILIZATION   No surgical     HPI:  63 yo male referred from PCP 9/23 for hypotension. Initial CTH negative, though MRI 9/28 shows acute infarcts with restricted diffusion in the R basal ganglia, L coronia radiata, and L medial temporal lobe. Previously on  a soft diet with Cortrak placed 9/26 after pt's intake was variable secondary to mentation/EtOH  withdrawal. Made NPO after MRI findings and SLP consulted 9/29. PMH includes seizures, PUD, GERD, HTN, anemia, alcohol use, anxiety   Assessment / Plan / Recommendation Clinical Impression  Pt oriented x4 today and demonstrating emerging awareness related to safety. Pt is able to verbalize that he is wearing a fall risk bracelet and should not get up unassisted but this is expected to be impacted by attention and memory deficits. Sustaining attention to functional tasks or conversation and carrying over new information are areas that present challenges for pt. Impulsivity is noted during PO intake but he demonstrates intact verbal reasoning when discussing diet modification. Given pt's level of independence PTA, recommend intensive SLP f/u, >3 hrs/day. Will continue following acutely.     SLP Assessment  SLP Recommendation/Assessment: Patient needs continued Speech Language Pathology Services SLP Visit Diagnosis: Cognitive communication deficit (R41.841)     Assistance Recommended at Discharge  Frequent or constant Supervision/Assistance  Functional Status Assessment Patient has had a recent decline in their functional status and demonstrates the ability to make significant improvements in function in a reasonable and predictable amount of time.  Frequency and Duration min 2x/week  2 weeks      SLP Evaluation Cognition  Overall Cognitive Status: Impaired/Different from baseline Arousal/Alertness: Awake/alert Orientation Level: Oriented X4 Attention: Sustained Sustained Attention: Impaired Sustained Attention Impairment: Verbal basic;Functional basic Memory: Impaired Memory Impairment: Decreased recall of new information Awareness: Impaired Awareness Impairment: Emergent impairment Problem Solving: Impaired Problem Solving Impairment: Functional basic;Verbal basic Executive Function: Reasoning Reasoning: Impaired Reasoning Impairment: Verbal basic;Functional  basic        Comprehension  Auditory Comprehension Overall Auditory Comprehension: Appears within functional limits for tasks assessed    Expression Expression Primary Mode of Expression: Verbal Verbal Expression Overall Verbal Expression: Appears within functional limits for tasks assessed   Oral / Motor  Oral Motor/Sensory Function Overall Oral Motor/Sensory Function: Within functional limits Motor Speech Overall Motor Speech: Appears within functional limits for tasks assessed            Damien Blumenthal, M.A., CCC-SLP Speech Language Pathology, Acute Rehabilitation Services  Secure Chat preferred [phone]  03/10/2024, 10:18 AM

## 2024-03-10 NOTE — Progress Notes (Signed)
  Inpatient Rehabilitation Admissions Coordinator   Met with patient at bedside for rehab assessment and spoke with his wife by phone. We discussed goals and expectations of a possible CIR admit. Wife  prefers CIR for rehab. Family can provide expected caregiver support that is recommended. I will request Rehab MD to consult to assist with obtaining Auth for possible Cir admit.I will follow up tomorrow. Please call me with any questions.   Heron Leavell, RN, MSN Rehab Admissions Coordinator [phone]

## 2024-03-10 NOTE — Progress Notes (Signed)
 Calorie Count Note  24 hour calorie count ordered.  Diet: Dysphagia 3, thin liquids Supplements: Ensure High Protein, Mighty Shakes   9/30: Breakfast: 50% Omelet, bites of grits (190 kcal, 9 gm protein) Lunch: 50% mashed potatoes with gray and green beans (82 kcal, 2 gm protein) Dinner: 25% [place], cheese sandwich (50 kcal, 4 gm protein) Supplements: 2 Ensure, 1 Boost from home (1050 kcal, 60 gm protein.   Total intake: 1372 kcal (76% of minimum estimated needs)  75 gm protein (83% of minimum estimated needs)  Estimated Nutritional Needs:  Kcal:  1800-2000 kcal Protein:  90-110 gm Fluid:  >1.8L/day  Olivia Kenning, RD Registered Dietitian  See Amion for more information

## 2024-03-10 NOTE — Progress Notes (Signed)
 Speech Language Pathology Treatment: Dysphagia  Patient Details Name: Casey Warren MRN: [id number] DOB: 08-May-1961 Today's Date: 03/10/2024 Time: 9074-9061 SLP Time Calculation (min) (ACUTE ONLY): 13 min  Assessment / Plan / Recommendation Clinical Impression  Given set up assistance, pt fed himself from Dys 3 breakfast tray. Mastication is significantly prolonged with a liquid wash needed to clear his oral cavity. This is suspected to be impacted by inattention and missing dentition. RN gave multiple pills during session, which he took with thin liquids. When taking multiple pills at one time, he gagged but there were no s/s of aspiration. He is agreeable to trying softer solids so recommend Dys 2 solids and continuing thin liquids. Give meds one at a time. SLP will f/u.    HPI HPI: 63 yo male referred from PCP 9/23 for hypotension. Initial CTH negative, though MRI 9/28 shows acute infarcts with restricted diffusion in the R basal ganglia, L coronia radiata, and L medial temporal lobe. Previously on  a soft diet with Cortrak placed 9/26 after pt's intake was variable secondary to mentation/EtOH withdrawal. Made NPO after MRI findings and SLP consulted 9/29. PMH includes seizures, PUD, GERD, HTN, anemia, alcohol use, anxiety      SLP Plan  Continue with current plan of care          Recommendations  Diet recommendations: Dysphagia 2 (fine chop);Thin liquid Liquids provided via: Cup;Straw Medication Administration: Whole meds with liquid Supervision: Patient able to self feed;Intermittent supervision to cue for compensatory strategies Compensations: Slow rate;Small sips/bites Postural Changes and/or Swallow Maneuvers: Seated upright 90 degrees                  Oral care BID   Frequent or constant Supervision/Assistance Dysphagia, unspecified (R13.10)     Continue with current plan of care     Damien Blumenthal, M.A., CCC-SLP Speech Language Pathology, Acute Rehabilitation  Services  Secure Chat preferred [phone]   03/10/2024, 10:12 AM

## 2024-03-10 NOTE — Progress Notes (Signed)
 Nutrition Follow-up  DOCUMENTATION CODES:   Severe malnutrition in context of chronic illness  INTERVENTION:  Remove NGT and DC tube feeds Encourage PO intake - ON dysphagia 2 diet, thin liquids per speech Room service with assist, pt cannot read menu Ensure Plus High Protein po BID, each supplement provides 350 kcal and 20 grams of protein Mighty Shake TID with meals, each supplement provides 330 kcals and 9 grams of protein Monitor magnesium, potassium, and phosphorus daily for at least 3 days, MD to replete as needed  Discontinue calorie count   *Per daughter cannot have citrus foods, banana, watermelon. Can have berries, pineapple, apples  NUTRITION DIAGNOSIS:   Severe Malnutrition related to chronic illness (ETOH abuse/withdrawl) as evidenced by energy intake < or equal to 75% for > or equal to 1 month, moderate fat depletion, moderate muscle depletion (a 17 lb, 10.6% wt loss in 6 months.). - Ongoing   GOAL:   Patient will meet greater than or equal to 90% of their needs - Progressing   MONITOR:   PO intake, Diet advancement, TF tolerance, Weight trends, Supplement acceptance  REASON FOR ASSESSMENT:   Consult Assessment of nutrition requirement/status  ASSESSMENT:  63 y.o. male with PMH of HTN, HLD,  hx of CVA, chronic anxiety/depression, ETOH abuse (last drink 1 week PTA), admitted for hypotension of unclear etiology.   9/26 - Cortrak placed, tube feeds started  9/28 - MRI brain, showed acute infarcts in the right basal ganglia, left corona radiator and left medial temporal lobe. NPO. 9/29 Nocturnal tube feeds, calorie count 9/30 - DC tube feeds removed NGT, meeting 76% kcal and 83% protein needs, diet downgraded to DYS2 by speech   Awake and alert, eating well, calorie count showed pt is meeting 76% kcal and 83% protein needs. However this is mostly from supplements. Ok to removed NGT tube. RD removed NGT.  Pt was sleeping right before visit. He is happy to have  the tube taken out. Much more awake and alert today when aroused. Did not have breakfast yet but speech and nursing came in during visit to help him set up for breakfast. Pt has poor dentition and RD placed him on Dysphagia 3 diet. After speech evaluation today pt now on dysphagia 2 diet. Encourage PO intake and ONS.   Called pt's daughter, Patience, on the phone to confirm pt's allergies. She reports he cannot have citrus fruits such as limes lemons, oranges, etc and no bananas as he will have an anaphylaxis episode. Can have apples, pineapples, berries, etc.  Attempted to liberalize diet as much as possible in [facility] to align with pt's allergies and wants. If [facility] is not allowing certain foods to be sent due to allergies, encouraged daughter to bring in foods she knows are safe him.   Admit weight: 68 kg Current weight: 70.6 kg   Average Meal Intake: 9/29-9/30: 35% intake x 3 recorded meals  Nutritionally Relevant Medications: Scheduled Meds:  aspirin   81 mg Oral Daily   atenolol   50 mg Oral Daily   atorvastatin   40 mg Oral Daily   clopidogrel  75 mg Oral Daily   dexamethasone  (DECADRON ) injection  4 mg Intravenous Q24H   escitalopram   20 mg Oral Daily   feeding supplement  237 mL Oral BID BM   folic acid   1 mg Oral Daily   heparin   5,000 Units Subcutaneous Q8H   methIMAzole   20 mg Oral Daily   multivitamin with minerals  1 tablet Oral Daily  phosphorus  500 mg Per Tube TID   thiamine  (VITAMIN B1) injection  100 mg Intravenous Q8H   topiramate   100 mg Oral BID   Continuous Infusions:  azithromycin 500 mg (03/10/24 1227)   cefTRIAXone (ROCEPHIN)  IV 2 g (03/10/24 0936)   Labs Reviewed: Phosphorus 1.5 Magnesium 1.4  Potassium 3.9  AST 68 ALT 154 CBG ranges from 110-117 mg/dL over the last 24 hours HgbA1c 6.8  Diet Order:   Diet Order             DIET DYS 2 Room service appropriate? No; Fluid consistency: Thin  Diet effective now                    EDUCATION NEEDS:   Not appropriate for education at this time  Skin:  Skin Assessment: Reviewed RN Assessment  Last BM:  10/1, type 6  Height:   Ht Readings from Last 1 Encounters:  03/02/24 5' 4 (1.626 m)    Weight:   Wt Readings from Last 1 Encounters:  03/10/24 70.6 kg   BMI:  Body mass index is 26.72 kg/m.  Estimated Nutritional Needs:   Kcal:  1800-2000 kcal  Protein:  90-110 gm  Fluid:  >1.8L/day   Olivia Kenning, RD Registered Dietitian  See Amion for more information

## 2024-03-10 NOTE — Progress Notes (Signed)
 [facility] Progress Note Patient: Casey Warren FMW:[id number] DOB: 11/14/1960  DOA: 03/02/2024 DOS: the patient was seen and examined on 03/10/2024  Brief Hospital Course: KAITLIN ARDITO is a 63 y.o. male with PMH of  hypertension, hyperlipidemia, chronic anxiety/depression, alcoholism, who presents to the ER, referred by his PCP due to low blood pressures of unclear etiology. Per family members at bedside he has cut down on alcohol consumption.  Last alcoholic drink was about a week ago. In the ER, hypotensive with SBP's in the 70's.  Creatinine elevated 4.14 above baseline.  Alert and oriented x 2.  Received 2L IVF with improvement of his BP.   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  No acute intracranial abnormality.He has had a decline in memory for years. Assessment and plan: Acute encephalopathy/agitation/delirium Alcohol abuse, w/ withdrawals Recent cognitive decline last Etoh with wine bottle on 02/26/24 per daughter,normally heavy drinker-he had weaned himself from beer for a month. Psych consulted. Treated with CIWA protocol and high-dose thiamine . Now on Posey. MRI brain shows acute infarct in right basal ganglia, left corona radiata as well as medial temporal lobe and left.  Hyperthyroidism/concern for hypothyroid crisis Relative adrenal insufficiency w/ low cortisol: Cosyntropin  test reassuring  Given patient is seriously ill,-discussed with outpatient [facility] Dr Mercie: US  thryoid is resutled- moderately heterogenous thyroid  gland- likely thyroiditis. Received PTU initially with and now on methimazole . Continue atenolol .  Also on Decadron .    Acute kidney injury Metabolic acidosis Hypokalemia Hypophosphatemia. Hypomagnesemia. Hypernatremia. AKI in the setting of hypotension/poor oral intake.  Creatinine nicely improving with IV fluid  hydration Electrolyte being replaced.  Hypotension History of hypertension-PTA on amlodipine /valsartan /HCTZ: Blood pressure now stable.  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 Neurology was consulted. Currently on aspirin  and Plavix for 3 weeks followed by Plavix alone. Require 30-day heart monitor after discharge. CIR following.   Chronic anxiety/depression Cont  home regimen.  Psych seeing the patient as well  Lung nodules: Fu in 12 m w/ CT chest if high risk  Severe protein-calorie malnutrition. Refeeding syndrome. Continue supplementation. Had a Cortrak which is now removed.   Subjective: No nausea no vomiting no fever no chills.  Reports diarrhea.  Physical Exam: Basal crackles. S1-S2 present bronchial bowel sound present Nontender. No edema.  Data Reviewed: I have Reviewed nursing notes, Vitals, and Lab results. Since last encounter, pertinent lab results CBC and BMP   . I have ordered test including CBC and BMP  .   Disposition: Status is: Inpatient Remains inpatient appropriate because: Monitor for improvement in electrolytes.  heparin  injection 5,000 Units Start: 03/02/24 2200   Family Communication: No one at bedside Level of care: Progressive   Vitals:   03/10/24 0815 03/10/24 1200 03/10/24 1518 03/10/24 1914  BP: 136/76 115/73 110/81 122/76  Pulse: 86 90 100 94  Resp: 13 20 20 20   Temp: 98 F (36.7 C) 98.1 F (36.7 C) 98.1 F (36.7 C) 97.8 F (36.6 C)  TempSrc: Oral Oral Oral Oral  SpO2: 96% 96% 96% 98%  Weight:      Height:         Author: Yetta Blanch, MD 03/10/2024 7:25 PM  Please look on [email or web address] to find out who is on call.

## 2024-03-11 DIAGNOSIS — E861 Hypovolemia: Secondary | ICD-10-CM | POA: Diagnosis not present

## 2024-03-11 DIAGNOSIS — I6381 Other cerebral infarction due to occlusion or stenosis of small artery: Secondary | ICD-10-CM | POA: Diagnosis not present

## 2024-03-11 LAB — CBC WITH DIFFERENTIAL/PLATELET
Abs Immature Granulocytes: 0.32 K/uL — ABNORMAL HIGH (ref 0.00–0.07)
Basophils Absolute: 0 K/uL (ref 0.0–0.1)
Basophils Relative: 0 %
Eosinophils Absolute: 0.2 K/uL (ref 0.0–0.5)
Eosinophils Relative: 2 %
HCT: 31.6 % — ABNORMAL LOW (ref 39.0–52.0)
Hemoglobin: 10.8 g/dL — ABNORMAL LOW (ref 13.0–17.0)
Immature Granulocytes: 2 %
Lymphocytes Relative: 21 %
Lymphs Abs: 3.2 K/uL (ref 0.7–4.0)
MCH: 29.6 pg (ref 26.0–34.0)
MCHC: 34.2 g/dL (ref 30.0–36.0)
MCV: 86.6 fL (ref 80.0–100.0)
Monocytes Absolute: 1.3 K/uL — ABNORMAL HIGH (ref 0.1–1.0)
Monocytes Relative: 8 %
Neutro Abs: 10.2 K/uL — ABNORMAL HIGH (ref 1.7–7.7)
Neutrophils Relative %: 67 %
Platelets: 222 K/uL (ref 150–400)
RBC: 3.65 MIL/uL — ABNORMAL LOW (ref 4.22–5.81)
RDW: 13.2 % (ref 11.5–15.5)
WBC: 15.3 K/uL — ABNORMAL HIGH (ref 4.0–10.5)
nRBC: 0 % (ref 0.0–0.2)

## 2024-03-11 LAB — COMPREHENSIVE METABOLIC PANEL WITH GFR
ALT: 250 U/L — ABNORMAL HIGH (ref 0–44)
AST: 96 U/L — ABNORMAL HIGH (ref 15–41)
Albumin: 2.4 g/dL — ABNORMAL LOW (ref 3.5–5.0)
Alkaline Phosphatase: 109 U/L (ref 38–126)
Anion gap: 7 (ref 5–15)
BUN: 14 mg/dL (ref 8–23)
CO2: 16 mmol/L — ABNORMAL LOW (ref 22–32)
Calcium: 8.2 mg/dL — ABNORMAL LOW (ref 8.9–10.3)
Chloride: 114 mmol/L — ABNORMAL HIGH (ref 98–111)
Creatinine, Ser: 0.85 mg/dL (ref 0.61–1.24)
GFR, Estimated: >60 mL/min (ref >60)
Glucose, Bld: 94 mg/dL (ref 70–99)
Potassium: 3.6 mmol/L (ref 3.5–5.1)
Sodium: 137 mmol/L (ref 135–145)
Total Bilirubin: 0.6 mg/dL (ref 0.0–1.2)
Total Protein: 5.1 g/dL — ABNORMAL LOW (ref 6.5–8.1)

## 2024-03-11 LAB — CULTURE, BLOOD (ROUTINE X 2)
Culture: NO GROWTH
Culture: NO GROWTH
Special Requests: ADEQUATE
Special Requests: ADEQUATE

## 2024-03-11 LAB — PHOSPHORUS
Phosphorus: 2.8 mg/dL (ref 2.5–4.6)
Phosphorus: 2 mg/dL — ABNORMAL LOW (ref 2.5–4.6)

## 2024-03-11 LAB — MAGNESIUM
Magnesium: 1.8 mg/dL (ref 1.7–2.4)
Magnesium: 1.6 mg/dL — ABNORMAL LOW (ref 1.7–2.4)

## 2024-03-11 MED ORDER — K PHOS MONO-SOD PHOS DI & MONO 155-852-130 MG PO TABS
500.0000 mg | ORAL_TABLET | Freq: Two times a day (BID) | ORAL | Status: DC
  Administered 2024-03-11: 500 mg via ORAL
  Filled 2024-03-11 (×2): qty 2

## 2024-03-11 MED ORDER — MAGNESIUM SULFATE 4 GM/100ML IV SOLN
4.0000 g | Freq: Once | INTRAVENOUS | Status: AC
  Administered 2024-03-11: 4 g via INTRAVENOUS
  Filled 2024-03-11: qty 100

## 2024-03-11 MED ORDER — LOPERAMIDE HCL 2 MG PO CAPS
2.0000 mg | ORAL_CAPSULE | ORAL | Status: DC | PRN
  Administered 2024-03-11 – 2024-03-12 (×2): 2 mg via ORAL
  Filled 2024-03-11 (×2): qty 1

## 2024-03-11 NOTE — Progress Notes (Signed)
 [facility] Progress Note Patient: Casey Warren FMW:[id number] DOB: January 17, 1961  DOA: 03/02/2024 DOS: the patient was seen and examined on 03/11/2024  Brief Hospital Course: Casey Warren is a 63 y.o. male with PMH of  hypertension, hyperlipidemia, chronic anxiety/depression, alcoholism, who presents to the ER, referred by his PCP due to low blood pressures of unclear etiology. Per family members at bedside he has cut down on alcohol consumption.  Last alcoholic drink was about a week ago. In the ER, hypotensive with SBP's in the 70's.  Creatinine elevated 4.14 above baseline.  Alert and oriented x 2.  Received 2L IVF with improvement of his BP.   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  No acute intracranial abnormality.He has had a decline in memory for years. Assessment and plan: Acute encephalopathy/agitation/delirium Alcohol abuse, w/ withdrawals Recent cognitive decline last Etoh with wine bottle on 02/26/24 per daughter,normally heavy drinker-he had weaned himself from beer for a month. Psych consulted. Treated with CIWA protocol and high-dose thiamine . MRI brain shows acute infarct in right basal ganglia, left corona radiata as well as medial temporal lobe and left. PT OT recommending CIR.  Awaiting placement.  Hyperthyroidism Possible hypothyroid crisis Relative adrenal insufficiency w/ low cortisol: TSH undetectable T39.8 Free T4 3.25. TSI 5.08.  Elevated. Thyrotropin receptor antibody 8.45 elevated. A.m. cortisol at 5 AM was low, but Cosyntropin  test reassuring  Prior provider, discussed with outpatient [facility] Dr Mercie US  thryoid moderately heterogenous thyroid  gland- likely thyroiditis. Received PTU initially with and now on methimazole . Continue atenolol .  Also on Decadron  last dose of therapy 10/2.  Acute kidney injury Metabolic  acidosis Hypokalemia Hypophosphatemia. Hypomagnesemia. Hypernatremia. AKI in the setting of hypotension/poor oral intake.  Creatinine improved with IV hydration. Electrolyte being replaced.  Hypotension History of hypertension-PTA on amlodipine /valsartan /HCTZ: Blood pressure now stable.  Continue atenolol .  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 Neurology was consulted. Currently on aspirin  and Plavix for 3 weeks followed by Plavix alone. Require 30-day heart monitor after discharge. CIR following.   Chronic anxiety/depression Psychiatry was consulted.  Now signed off. Currently on Lexapro  and Haldol  as needed for agitation.  Lung nodules: 3 mm lingular, 5 mm right upper lobe Follow-up in 12 month w/ CT chest  Severe protein-calorie malnutrition. Refeeding syndrome. Continue supplementation. Had a Cortrak which is now removed.  LFT elevation. Levels fluctuating.  Bilirubin normal.  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  Monitor.  HLD. On statin.  Due to stroke. Monitor while LFTs elevation.  Tension headache. Continue Topamax .  Steroid-induced leukocytosis. No evidence of clinical infection.  Monitor.  Non-anion gap metabolic acidosis. Likely from hyperchloremia.  As well as diarrhea. Patient remains asymptomatic.  Possible pneumonia. Treated with ceftriaxone azithromycin for 5 days. Monitor.  Antibiotic induced diarrhea. Antibiotic is now stopped. Imodium x 1. Monitor.   Subjective: No nausea no vomiting.  Reports 2 loose bowel movement so far.  No abdominal pain.  Oral intake adequate.  Physical Exam: Clear to auscultation. S1-S2 present Bowel sounds present.  Nontender.  Data Reviewed: I have Reviewed nursing notes, Vitals, and Lab results. Since last encounter, pertinent lab results CBC BMP magnesium phosphorus   . I have ordered test including CBC BMP magnesium phosphorus  .      Disposition: Status is: Inpatient Remains inpatient appropriate because: Monitor for improvement in oral intake electrolyte abnormalities  heparin  injection 5,000 Units Start: 03/02/24 2200   Family Communication: No one at bedside Level of care: Progressive   Vitals:   03/11/24 0500 03/11/24 0804 03/11/24 1148 03/11/24 1542  BP:  125/63 99/82 113/73  Pulse:  80 89 82  Resp:  18 20 15   Temp:  97.8 F (36.6 C) 98.2 F (36.8 C) 98.6 F (37 C)  TempSrc:  Axillary Oral Oral  SpO2:  96% 97% 98%  Weight: 67.8 kg     Height:         Author: Yetta Blanch, MD 03/11/2024 7:20 PM  Please look on [email or web address] to find out who is on call.

## 2024-03-11 NOTE — Consult Note (Signed)
 Physical Medicine and Rehabilitation Consult Reason for Consult:CIR Referring Physician: Dr Yetta Blanch   HPI: Casey Warren is a 63 y.o. R handed male with hx of CVA, Seizures, PUD, GERD, HTN, anemia  and Alcohol use disorder- admitted 9/23 due to severe orthostatic hypotension- with BP in 70's after was cutting down on drinking. He was dx'd with AKI- with Cr of 4.14; admitted for delirium and encephalopathy and AKI;  Has been found to have pneumonia; Acute hyperthyroidism- with needing PTU and Methimazole ; also found to have severe malnutrition and lung nodules; and DM with A1c 6.8. He was dx'd with Multiple strokes on 9/28- with MRI showing R basal ganglia, L corona radiata; and L medial temporal lobe. He's been seen by therapy- as well. Min A so far 75 ft with RW, but ADLs max A 3 days ago- with posterior bias.    Having diarrhea- 4-5x/day and incontinent when he goes every time Has L shoulder pain- thinks it's coming from neck- and radiates down to  L fingers- nothing numb, but is tingling.   On D2 thin diet.   Review of Systems  Constitutional:  Positive for malaise/fatigue and weight loss.  HENT: Negative.    Eyes: Negative.   Respiratory: Negative.    Cardiovascular:  Negative for chest pain and leg swelling.  Gastrointestinal:  Positive for abdominal pain and diarrhea. Negative for constipation, nausea and vomiting.       Abd discomfort/gurgling  Genitourinary: Negative.   Musculoskeletal:  Positive for joint pain.       L shoulder pain  Skin: Negative.   Neurological:  Positive for tingling and weakness. Negative for sensory change and speech change.  Endo/Heme/Allergies: Negative.   Psychiatric/Behavioral:  Positive for memory loss.   All other systems reviewed and are negative.  Past Medical History:  Diagnosis Date   Angio-edema    Anxiety    related to surgery   Asthma    GERD (gastroesophageal reflux disease)    History of blood transfusion    HTN  (hypertension)    Seizures (HCC)    last seizure in 2023 per patient   Ulcer    Past Surgical History:  Procedure Laterality Date   CLOSED REDUCTION NASAL FRACTURE  11/19/2011   Procedure: CLOSED REDUCTION NASAL FRACTURE;  Surgeon: Marlyce Finer, MD;  Location: [facility] OR;  Service: ENT;  Laterality: N/A;  CLOSED REDUCTION NASAL AND NASAL SEPTAL FRACTURE WITH STABILIZATION   No surgical     Family History  Problem Relation Age of Onset   Stroke Mother        Died age 19   Hypertension Mother    Diabetes Mother    Breast cancer Mother    Leukemia Father        Died age 4   Allergic rhinitis Sister    Asthma Grandson    Colon cancer Neg Hx    Social History:  reports that he has been smoking cigars and cigarettes. He has never been exposed to tobacco smoke. He has never used smokeless tobacco. He reports that he does not currently use alcohol. He reports that he does not use drugs. Allergies:  Allergies  Allergen Reactions   Banana Anaphylaxis    Per family   Citrus Anaphylaxis    Per daughter only allergic to citrus but can have apples, pineapple, etc   Chlorthalidone  Other (See Comments)    Hyponatremia   Facility-Administered Medications Prior to Admission  Medication Dose Route  Frequency Provider Last Rate Last Admin   cloNIDine  (CATAPRES ) tablet 0.1 mg  0.1 mg Oral Once        Medications Prior to Admission  Medication Sig Dispense Refill   albuterol  (VENTOLIN  HFA) 108 (90 Base) MCG/ACT inhaler Inhale 2 puffs into the lungs every 6 (six) hours as needed for wheezing or shortness of breath. 18 g 2   amLODipine  (NORVASC ) 10 MG tablet Take 1 tablet (10 mg total) by mouth daily. 90 tablet 3   aspirin  EC 81 MG tablet Take 81 mg by mouth daily.     atorvastatin  (LIPITOR) 20 MG tablet TAKE 1 TABLET BY MOUTH EVERY DAY 90 tablet 1   cyclobenzaprine  (FLEXERIL ) 10 MG tablet Take 1 tablet (10 mg total) by mouth 3 (three) times daily as needed for muscle spasms. 45 tablet 0    escitalopram  (LEXAPRO ) 20 MG tablet Take 1 tablet (20 mg total) by mouth daily. 90 tablet 1   hydrochlorothiazide  (HYDRODIURIL ) 12.5 MG tablet Take 12.5 mg by mouth daily.     loratadine  (CLARITIN ) 10 MG tablet TAKE 1 TABLET BY MOUTH EVERY DAY AS NEEDED FOR ALLERGY 90 tablet 1   topiramate  (TOPAMAX ) 100 MG tablet Take 100 mg by mouth 2 (two) times daily.     valsartan  (DIOVAN ) 40 MG tablet Take 1 tablet (40 mg total) by mouth daily. 90 tablet 1   EPINEPHrine  0.3 mg/0.3 mL IJ SOAJ injection Inject 0.3 mg into the muscle as needed for anaphylaxis. 1 each 2    Home: Home Living Family/patient expects to be discharged to:: Private residence Living Arrangements: Spouse/significant other Available Help at Discharge: Family Type of Home: House Home Access: Stairs to enter Secretary/administrator of Steps: 6 Entrance Stairs-Rails: Can reach both Home Layout: One level Bathroom Shower/Tub: Engineer, manufacturing systems: Standard Home Equipment: Environmental education officer Comments: Pt not reliable historian  Functional History: Prior Function Prior Level of Function : Patient poor historian/Family not available Mobility Comments: pt reports he does not use an AD Functional Status:  Mobility: Bed Mobility Overal bed mobility: Needs Assistance Bed Mobility: Supine to Sit, Sit to Supine Supine to sit: Min assist, HOB elevated Sit to supine: Min assist, HOB elevated General bed mobility comments: presented in the bathroom Transfers Overall transfer level: Needs assistance Equipment used: Rolling walker (2 wheels) Transfers: Sit to/from Stand Sit to Stand: Contact guard assist Bed to/from chair/wheelchair/BSC transfer type:: Step pivot Step pivot transfers: Mod assist, +2 physical assistance, +2 safety/equipment General transfer comment: cues on hand placement Ambulation/Gait Ambulation/Gait assistance: Min assist Gait Distance (Feet): 75 Feet Assistive device: Rolling walker (2  wheels) Gait Pattern/deviations: Step-through pattern, Decreased stride length, Trunk flexed, Leaning posteriorly General Gait Details: assist for steadying for x2 posterior LOB, cues for proximity to RW, increasing step length and gait speed Gait velocity: decr Gait velocity interpretation: <1.31 ft/sec, indicative of household ambulator    ADL: ADL Overall ADL's : Needs assistance/impaired Eating/Feeding: Set up, Sitting Grooming: Contact guard assist, Sitting Upper Body Bathing: Set up, Sitting Lower Body Bathing: Contact guard assist, Sit to/from stand Upper Body Dressing : Contact guard assist, Sitting Lower Body Dressing: Contact guard assist, Sit to/from stand Toilet Transfer: Contact guard assist, Cueing for safety, Cueing for sequencing Toileting- Clothing Manipulation and Hygiene: Contact guard assist, Minimal assistance Toileting - Clothing Manipulation Details (indicate cue type and reason): min assist to ensure clean Tub/ Shower Transfer: Walk-in shower, Contact guard assist, Minimal assistance, Rolling walker (2 wheels) Functional mobility during ADLs: Contact  guard assist, Minimal assistance, Rolling walker (2 wheels) General ADL Comments: limited by posterior bias, impaired cognition and global weakness  Cognition: Cognition Overall Cognitive Status: Impaired/Different from baseline Arousal/Alertness: Awake/alert Orientation Level: Oriented X4 Attention: Sustained Sustained Attention: Impaired Sustained Attention Impairment: Verbal basic, Functional basic Memory: Impaired Memory Impairment: Decreased recall of new information Awareness: Impaired Awareness Impairment: Emergent impairment Problem Solving: Impaired Problem Solving Impairment: Functional basic, Verbal basic Executive Function: Reasoning Reasoning: Impaired Reasoning Impairment: Verbal basic, Functional basic Cognition Arousal: Alert Behavior During Therapy: WFL for tasks  assessed/performed Overall Cognitive Status: Impaired/Different from baseline  Blood pressure 99/82, pulse 89, temperature 98.2 F (36.8 C), temperature source Oral, resp. rate 20, height 5' 4 (1.626 m), weight 67.8 kg, SpO2 97%. Physical Exam Vitals and nursing note reviewed.  Constitutional:      Appearance: Normal appearance. He is normal weight.     Comments: Pt sitting up in bed; awake, alert, appropriate, NAD  HENT:     Head: Normocephalic and atraumatic.     Comments: L facial droop Tongue midline Facial sensation intact    Nose: Nose normal. No congestion.     Mouth/Throat:     Mouth: Mucous membranes are dry.     Pharynx: Oropharynx is clear. No oropharyngeal exudate.  Eyes:     General:        Right eye: No discharge.        Left eye: No discharge.     Extraocular Movements: Extraocular movements intact.  Cardiovascular:     Rate and Rhythm: Normal rate and regular rhythm.     Heart sounds: Normal heart sounds. No murmur heard.    No gallop.     Comments: Occ extra beat Pulmonary:     Effort: Pulmonary effort is normal. No respiratory distress.     Breath sounds: Normal breath sounds. No wheezing, rhonchi or rales.     Comments: Decreased at bases B/L Abdominal:     General: There is no distension.     Palpations: Abdomen is soft.     Tenderness: There is no abdominal tenderness.     Comments: hyperactive  Genitourinary:    Comments: Condom cath with bag- medium amber urine Musculoskeletal:     Cervical back: Neck supple. No tenderness.     Comments: RUE 5/5 LUE- 4+/5 throughout RLE- 5-/5 LLE- 4+ to 5-/5- slightly less than R side TTP over posterior L shoulder- empty can test (+) and Pain with ROM of L shoulder  Skin:    General: Skin is warm and dry.     Comments: Intact IV RUE- looks OK  Neurological:     Mental Status: He is alert.     Comments: Mild nystagmus DTRs normal in Ue's and absent LE's No clonus or hoffmans or increased tone Knew  2025 Thought September- with cues, October 3rd Know in hospital in [place]- doesn't know name of hospital Swears he's 61- is 67  Psychiatric:     Comments: Pleasant interactive; bright affect     Results for orders placed or performed during the hospital encounter of 03/02/24 (from the past 24 hours)  Magnesium     Status: None   Collection Time: 03/10/24  6:32 PM  Result Value Ref Range   Magnesium 2.0 1.7 - 2.4 mg/dL  Phosphorus     Status: Abnormal   Collection Time: 03/10/24  6:32 PM  Result Value Ref Range   Phosphorus 1.9 (L) 2.5 - 4.6 mg/dL  Comprehensive metabolic panel  Status: Abnormal   Collection Time: 03/10/24  6:32 PM  Result Value Ref Range   Sodium 134 (L) 135 - 145 mmol/L   Potassium 4.3 3.5 - 5.1 mmol/L   Chloride 111 98 - 111 mmol/L   CO2 17 (L) 22 - 32 mmol/L   Glucose, Bld 109 (H) 70 - 99 mg/dL   BUN 19 8 - 23 mg/dL   Creatinine, Ser 9.03 0.61 - 1.24 mg/dL   Calcium  8.1 (L) 8.9 - 10.3 mg/dL   Total Protein 5.4 (L) 6.5 - 8.1 g/dL   Albumin 2.6 (L) 3.5 - 5.0 g/dL   AST 878 (H) 15 - 41 U/L   ALT 267 (H) 0 - 44 U/L   Alkaline Phosphatase 128 (H) 38 - 126 U/L   Total Bilirubin 0.5 0.0 - 1.2 mg/dL   GFR, Estimated >39 >39 mL/min   Anion gap 6 5 - 15  Glucose, capillary     Status: Abnormal   Collection Time: 03/10/24  7:10 PM  Result Value Ref Range   Glucose-Capillary 356 (H) 70 - 99 mg/dL  Magnesium     Status: None   Collection Time: 03/11/24  5:03 AM  Result Value Ref Range   Magnesium 1.8 1.7 - 2.4 mg/dL  Phosphorus     Status: None   Collection Time: 03/11/24  5:03 AM  Result Value Ref Range   Phosphorus 2.8 2.5 - 4.6 mg/dL  Comprehensive metabolic panel with GFR     Status: Abnormal   Collection Time: 03/11/24  5:03 AM  Result Value Ref Range   Sodium 137 135 - 145 mmol/L   Potassium 3.6 3.5 - 5.1 mmol/L   Chloride 114 (H) 98 - 111 mmol/L   CO2 16 (L) 22 - 32 mmol/L   Glucose, Bld 94 70 - 99 mg/dL   BUN 14 8 - 23 mg/dL    Creatinine, Ser 9.14 0.61 - 1.24 mg/dL   Calcium  8.2 (L) 8.9 - 10.3 mg/dL   Total Protein 5.1 (L) 6.5 - 8.1 g/dL   Albumin 2.4 (L) 3.5 - 5.0 g/dL   AST 96 (H) 15 - 41 U/L   ALT 250 (H) 0 - 44 U/L   Alkaline Phosphatase 109 38 - 126 U/L   Total Bilirubin 0.6 0.0 - 1.2 mg/dL   GFR, Estimated >39 >39 mL/min   Anion gap 7 5 - 15  CBC with Differential/Platelet     Status: Abnormal   Collection Time: 03/11/24  5:03 AM  Result Value Ref Range   WBC 15.3 (H) 4.0 - 10.5 K/uL   RBC 3.65 (L) 4.22 - 5.81 MIL/uL   Hemoglobin 10.8 (L) 13.0 - 17.0 g/dL   HCT 68.3 (L) 60.9 - 47.9 %   MCV 86.6 80.0 - 100.0 fL   MCH 29.6 26.0 - 34.0 pg   MCHC 34.2 30.0 - 36.0 g/dL   RDW 86.7 88.4 - 84.4 %   Platelets 222 150 - 400 K/uL   nRBC 0.0 0.0 - 0.2 %   Neutrophils Relative % 67 %   Neutro Abs 10.2 (H) 1.7 - 7.7 K/uL   Lymphocytes Relative 21 %   Lymphs Abs 3.2 0.7 - 4.0 K/uL   Monocytes Relative 8 %   Monocytes Absolute 1.3 (H) 0.1 - 1.0 K/uL   Eosinophils Relative 2 %   Eosinophils Absolute 0.2 0.0 - 0.5 K/uL   Basophils Relative 0 %   Basophils Absolute 0.0 0.0 - 0.1 K/uL   Immature Granulocytes 2 %  Abs Immature Granulocytes 0.32 (H) 0.00 - 0.07 K/uL   No results found.  Patient is a great candidate for inpt rehab to work on strengthening, gait, balance (since has posterior lean), ADLs and bowel incontinence after stroke- for stroke rehab. He also has medical issues including low BP, hyperthyroid acute issues, and on IV ABX for pneumonia; as well as L shoulder pain vs nerve pain form neck vs both.  Suggest low dose for Imodium or other treatment for  bowel incontinence and diarrhea 4-5x/day- put on Probiotic yesterday-OR might test for Cdiff- is a chance has Cdiff with rising leukocytosis- up to 15.3, but made worse due to incontinence, likely made worse due to his stroke- it's common to have incontinence- he is on IV ABX of note..  Of note, pt's memory appears still impaired- due to insisting was  63 yrs old- and that would turn 3 on nxt birthday [birth year]. Thank you for this consult   Duwaine Barrs, MD 03/11/2024

## 2024-03-11 NOTE — Plan of Care (Signed)

## 2024-03-11 NOTE — Progress Notes (Signed)
 Occupational Therapy Treatment Patient Details Name: Casey Warren MRN: [id number] DOB: [birth year] Today's Date: 03/11/2024   History of present illness Pt is 63 yo with hypotension referred from PCP on 9/23. 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 MRI 9/28 shows Acute infarcts with restricted diffusion in the R BG, L corona radiata, and L medial temporal lobe. PMH significant of seizures, PUD, GERD, hypertension, anemia, alcohol use, anxiety.   OT comments  Pt presented in the bathroom having BM and was going to get in the shower. Pt needed CGA with peri care and sit to stand transfers with cues on safety with grab bar/walker. He then transferred  with CGA with RW to complete shower while mainly in a seated position with cues on recall of what needed to still be cleaned as pt would go over areas already completed and CGA as decrease in risk of falling. He then required set up with cues on sequencing with application of deodorant and UE dressing and CGA for donning socks while sitting.  Pt then wanted to ambulate in the hallway and was able to complete with close CGA and occasional min assist to direct walker as distracted. Patient will benefit from intensive inpatient follow-up therapy, >3 hours/day.       If plan is discharge home, recommend the following:  A little help with walking and/or transfers;A little help with bathing/dressing/bathroom;Assistance with cooking/housework;Direct supervision/assist for medications management;Direct supervision/assist for financial management;Assist for transportation;Supervision due to cognitive status   Equipment Recommendations  Other (comment) (TBD at next venue)    Recommendations for Other Services Rehab consult    Precautions / Restrictions Precautions Precautions: Fall Recall of Precautions/Restrictions:  Impaired Restrictions Weight Bearing Restrictions Per Provider Order: No       Mobility Bed Mobility               General bed mobility comments: presented in the bathroom    Transfers Overall transfer level: Needs assistance Equipment used: Rolling walker (2 wheels) Transfers: Sit to/from Stand Sit to Stand: Contact guard assist           General transfer comment: cues on hand placement     Balance Overall balance assessment: Needs assistance Sitting-balance support: No upper extremity supported, Feet supported Sitting balance-Leahy Scale: Good     Standing balance support: Bilateral upper extremity supported, Single extremity supported Standing balance-Leahy Scale: Fair Standing balance comment: Pt able to maintian standing in shower with unilateral support                           ADL either performed or assessed with clinical judgement   ADL Overall ADL's : Needs assistance/impaired Eating/Feeding: Set up;Sitting   Grooming: Contact guard assist;Sitting   Upper Body Bathing: Set up;Sitting   Lower Body Bathing: Contact guard assist;Sit to/from stand   Upper Body Dressing : Contact guard assist;Sitting   Lower Body Dressing: Contact guard assist;Sit to/from stand   Toilet Transfer: Contact guard assist;Cueing for safety;Cueing for sequencing   Toileting- Clothing Manipulation and Hygiene: Contact guard assist;Minimal assistance Toileting - Clothing Manipulation Details (indicate cue type and reason): min assist to ensure clean Tub/ Shower Transfer: Walk-in shower;Contact guard assist;Minimal assistance;Rolling walker (2 wheels)   Functional mobility during ADLs: Contact guard assist;Minimal assistance;Rolling walker (2 wheels)      Extremity/Trunk Assessment Upper Extremity Assessment  Upper Extremity Assessment: LUE deficits/detail LUE Deficits / Details: Pt reporting pain in LUE intially and has decrease in AROM but then reported no  challenges at the end of session LUE Coordination: decreased fine motor;decreased gross motor            Vision       Perception     Praxis     Communication Communication Communication: No apparent difficulties Factors Affecting Communication: Reduced clarity of speech   Cognition Arousal: Alert Behavior During Therapy: WFL for tasks assessed/performed Cognition: Cognition impaired     Awareness: Online awareness impaired   Attention impairment (select first level of impairment): Alternating attention Executive functioning impairment (select all impairments): Reasoning, Problem solving OT - Cognition Comments: pt needed cues on saftey with completion of ADLs to decrease risk of falling in shower                 Following commands: Impaired Following commands impaired: Follows one step commands with increased time      Cueing   Cueing Techniques: Verbal cues, Gestural cues, Tactile cues  Exercises      Shoulder Instructions       General Comments      Pertinent Vitals/ Pain       Pain Assessment Pain Assessment: Faces Faces Pain Scale: Hurts little more Facial Expression: Relaxed, neutral Body Movements: Protection Muscle Tension: Relaxed Compliance with ventilator (intubated pts.): N/A Vocalization (extubated pts.): N/A CPOT Total: 1 Pain Location: L shoulder pain intially but then reported none at the end of session Pain Descriptors / Indicators: Discomfort Pain Intervention(s): Monitored during session  Home Living                                          Prior Functioning/Environment              Frequency  Min 2X/week        Progress Toward Goals  OT Goals(current goals can now be found in the care plan section)  Progress towards OT goals: Progressing toward goals  Acute Rehab OT Goals Patient Stated Goal: to get going OT Goal Formulation: With patient Time For Goal Achievement: 03/17/24 Potential to  Achieve Goals: Good ADL Goals Pt Will Perform Upper Body Dressing: with set-up;with supervision Pt Will Perform Lower Body Dressing: with set-up;with supervision Pt Will Transfer to Toilet: with set-up;with supervision Pt Will Perform Toileting - Clothing Manipulation and hygiene: with set-up;with supervision  Plan      Co-evaluation                 AM-PAC OT 6 Clicks Daily Activity     Outcome Measure   Help from another person eating meals?: A Little Help from another person taking care of personal grooming?: A Little Help from another person toileting, which includes using toliet, bedpan, or urinal?: A Little Help from another person bathing (including washing, rinsing, drying)?: A Little Help from another person to put on and taking off regular upper body clothing?: A Little Help from another person to put on and taking off regular lower body clothing?: A Little 6 Click Score: 18    End of Session Equipment Utilized During Treatment: Gait belt;Rolling walker (2 wheels)  OT Visit Diagnosis: Unsteadiness on feet (R26.81);Other abnormalities of gait and mobility (R26.89);Muscle weakness (generalized) (M62.81);Other symptoms and signs involving cognitive function   Activity Tolerance Patient tolerated  treatment well   Patient Left in chair;with call bell/phone within reach;with chair alarm set   Nurse Communication Mobility status        Time: 8940-8860 OT Time Calculation (min): 40 min  Charges: OT General Charges $OT Visit: 1 Visit OT Treatments $Self Care/Home Management : 38-52 mins  Warrick POUR OTR/L  Acute Rehab Services  [phone] office number   Warrick Berber 03/11/2024, 11:49 AM

## 2024-03-11 NOTE — Progress Notes (Signed)
   Inpatient Rehabilitation Admissions Coordinator   I will begin Auth with [facility] for possible Cir admit.  Heron Leavell, RN, MSN Rehab Admissions Coordinator [phone] 03/11/2024 2:38 PM

## 2024-03-11 NOTE — PMR Pre-admission (Shared)
 PMR Admission Coordinator Pre-Admission Assessment  Patient: Casey Warren is an 63 y.o., male MRN: [id number] DOB: 01-21-1961 Height: 5' 4 (162.6 cm) Weight: 67.8 kg  Insurance Information HMO: yes    PPO:      PCP:      IPA:      80/20:      OTHER: Medicare advantage plan PRIMARY: [facility]      Policy#: [id number]; Medicare [id number]      Subscriber: pt CM Name: ***      Phone#: ***     Fax#: [phone] Pre-Cert#: [id number] *** Auth for CIR from *** with *** for admit *** with next review date ***.  Updates due to *** at fax listed above.        Employer:  Benefits:  Phone #: [phone]     Name: 10/2 Eff. Date: 06/11/23 until 06/09/24     Deduct: $375 ( met)      Out of Pocket Max: [phone] met)      Life Max:  CIR: $1700 co pay per admit without medicaid; if medicaid eligible then covered at 100%      SNF: no co pay per day days 1 until 20; $214 co pay per day days 21 until 60; no c co pay[ay per day days 61 until 100 without medicaid, if medicaid eligible then covered 100% Outpatient: 100%     Co-Pay:  Home Health: 100%      Co-Pay:  DME: 82 %     Co-Pay: 18% Providers: in network  SECONDARY: Medicaid of [place]      Policy#: [id number]     Phone#: 10/2 verified with passport one source online active [employer]  Financial Counselor:       Phone#:   The "Data Collection Information Summary" for patients in Inpatient Rehabilitation Facilities with attached "Privacy Act Statement-Health Care Records" was provided and verbally reviewed with: Patient and Family  Emergency Contact Information Contact Information     Name Relation Home Work Boyd Daughter [phone]        Other Contacts     Name Relation Home Work Mobile   Julian Spouse [phone]  [phone]      Current Medical History  Patient Admitting Diagnosis: CVA  History of Present Illness: 63 yo male with history of HTN, HLD, chronic anxiety/depression,  decline in memory  for years, alcoholism who presented on 03/02/24 by his PCP due to his low BP of unclear etiology. He has recently cut down on his alcohol consumption. IN the ER SBP's in the 70S. Creat 4.14 above baseline. Last ETOH with wine on 9/18, normally a heavy drinker, he had weaned himself form beer for a month.   Treated with CIWA protocol and high dose thiamine . MRI revealed acute infarct in tight basal ganglia , left corona radiata as well as medial temporal lobe. CT head showed generalized cerebral atrophy with chronic inferior cerebellar infarcts and chronic lacunar infarcts within pons and basal ganglia on the right. Neurology consulted. Currently on ASA and Plavix for 3 weeks followed by Plavix alone. Recommend 30 day heart monitor after discharge.   Hyperthyroidism concern to hypothyroid crisis. Relative adrenal insufficiency with low cortisol. Case discussed with OP [facility], Dr Mercie, US  thyroid  resulted  with moderately heterogenous thyroid  gland, likely thyroiditis. Received PTU initially and now on methimazole . Continue atenolol  and Decadron .   AKI in setting of hypotension and poor oral intake. Creatinine improved with IV fluid hydration. H/O  HTN prior to admit on amlodipine , valsartan  and HCTZ. BP now stable.   Psychiatry consulted and continued on home regimen . Severe protein -calori malnutrition. Cortrak initially and now on calorie count, and dysphagia 2 diet. Heparin  sq for DVT prophylaxis.  Complete NIHSS TOTAL: 1  Patient's medical record from [facility] has been reviewed by the rehabilitation admission coordinator and physician.  Past Medical History  Past Medical History:  Diagnosis Date   Angio-edema    Anxiety    related to surgery   Asthma    GERD (gastroesophageal reflux disease)    History of blood transfusion    HTN (hypertension)    Seizures (HCC)    last seizure in 2023 per patient   Ulcer    Has the patient had major surgery during 100 days  prior to admission? No  Family History   family history includes Allergic rhinitis in his sister; Asthma in his grandson; Breast cancer in his mother; Diabetes in his mother; Hypertension in his mother; Leukemia in his father; Stroke in his mother.  Current Medications  Current Facility-Administered Medications:    acetaminophen  (TYLENOL ) tablet 650 mg, 650 mg, Oral, Q6H PRN, Drusilla, Sabas RAMAN, MD, 650 mg at 03/09/24 1238   albuterol  (PROVENTIL ) (2.5 MG/3ML) 0.083% nebulizer solution 2.5 mg, 2.5 mg, Inhalation, Q6H PRN, Hall, Carole N, DO   aspirin  chewable tablet 81 mg, 81 mg, Oral, Daily, Drusilla, Gagan S, MD, 81 mg at 03/11/24 9052   atenolol  (TENORMIN ) tablet 50 mg, 50 mg, Oral, Daily, Drusilla, Sabas RAMAN, MD, 50 mg at 03/11/24 0949   atorvastatin  (LIPITOR) tablet 40 mg, 40 mg, Oral, Daily, Jerri Pfeiffer, MD, 40 mg at 03/11/24 0948   cefTRIAXone (ROCEPHIN) 2 g in sodium chloride  0.9 % 100 mL IVPB, 2 g, Intravenous, Q24H, Reome, Earle J, RPH, Last Rate: 200 mL/hr at 03/11/24 0955, 2 g at 03/11/24 0955   clopidogrel (PLAVIX) tablet 75 mg, 75 mg, Oral, Daily, Drusilla, Sabas RAMAN, MD, 75 mg at 03/11/24 9051   escitalopram  (LEXAPRO ) tablet 20 mg, 20 mg, Oral, Daily, Drusilla, Gagan S, MD, 20 mg at 03/11/24 0947   feeding supplement (ENSURE PLUS HIGH PROTEIN) liquid 237 mL, 237 mL, Oral, BID BM, Drusilla, Gagan S, MD, 237 mL at 03/11/24 1402   folic acid  (FOLVITE ) tablet 1 mg, 1 mg, Oral, Daily, Drusilla, Sabas RAMAN, MD, 1 mg at 03/11/24 9046   haloperidol  (HALDOL ) tablet 2 mg, 2 mg, Oral, Daily PRN **OR** haloperidol  lactate (HALDOL ) injection 2 mg, 2 mg, Intramuscular, Daily PRN, Jacquetta Sharlot GRADE, NP, 2 mg at 03/10/24 0010   heparin  injection 5,000 Units, 5,000 Units, Subcutaneous, Q8H, Hall, Carole N, DO, 5,000 Units at 03/11/24 1402   LORazepam  (ATIVAN ) tablet 1 mg, 1 mg, Oral, Daily PRN, 1 mg at 03/10/24 2105 **OR** LORazepam  (ATIVAN ) injection 1 mg, 1 mg, Intravenous, Daily PRN, Jacquetta Sharlot GRADE, NP, 1 mg at 03/07/24 2033    methimazole  (TAPAZOLE ) tablet 20 mg, 20 mg, Oral, Daily, Drusilla, Sabas RAMAN, MD, 20 mg at 03/11/24 0946   multivitamin with minerals tablet 1 tablet, 1 tablet, Oral, Daily, Drusilla Sabas RAMAN, MD, 1 tablet at 03/11/24 0947   nicotine (NICODERM CQ - dosed in mg/24 hours) patch 21 mg, 21 mg, Transdermal, Daily, Drusilla, Gagan S, MD, 21 mg at 03/11/24 9047   Oral care mouth rinse, 15 mL, Mouth Rinse, 4 times per day, Drusilla Sabas RAMAN, MD, 15 mL at 03/11/24 1611   Oral care mouth rinse, 15 mL, Mouth  Rinse, PRN, Drusilla, Sabas RAMAN, MD   polyethylene glycol (MIRALAX  / GLYCOLAX ) packet 17 g, 17 g, Oral, Daily PRN, Drusilla, Sabas RAMAN, MD   prochlorperazine  (COMPAZINE ) injection 5 mg, 5 mg, Intravenous, Q6H PRN, Hall, Carole N, DO   saccharomyces boulardii (FLORASTOR) capsule 250 mg, 250 mg, Oral, BID, Patel, Pranav M, MD, 250 mg at 03/11/24 9053   thiamine  (VITAMIN B1) injection 100 mg, 100 mg, Intravenous, Q8H, Lama, Gagan S, MD, 100 mg at 03/11/24 1402   topiramate  (TOPAMAX ) tablet 100 mg, 100 mg, Oral, BID, Drusilla, Sabas RAMAN, MD, 100 mg at 03/11/24 9052  Patients Current Diet:  Diet Order             DIET DYS 2 Room service appropriate? Yes with Assist; Fluid consistency: Thin  Diet effective now                  Precautions / Restrictions Precautions Precautions: Fall Precaution/Restrictions Comments: cortrak (not running during session) Restrictions Weight Bearing Restrictions Per Provider Order: No   Has the patient had 2 or more falls or a fall with injury in the past year? Yes  Prior Activity Level Community (5-7x/wk): independent without AD  Prior Functional Level Self Care: Did the patient need help bathing, dressing, using the toilet or eating? Independent  Indoor Mobility: Did the patient need assistance with walking from room to room (with or without device)? Independent  Stairs: Did the patient need assistance with internal or external stairs (with or without device)? Independent  Functional  Cognition: Did the patient need help planning regular tasks such as shopping or remembering to take medications? Independent  Patient Information Are you of Hispanic, Latino/a,or Spanish origin?: A. No, not of Hispanic, Latino/a, or Spanish origin What is your race?: B. Black or African American Do you need or want an interpreter to communicate with a doctor or health care staff?: 0. No  Patient's Response To:  Health Literacy and Transportation Is the patient able to respond to health literacy and transportation needs?: Yes Health Literacy - How often do you need to have someone help you when you read instructions, pamphlets, or other written material from your doctor or pharmacy?: Sometimes In the past 12 months, has lack of transportation kept you from medical appointments or from getting medications?: No In the past 12 months, has lack of transportation kept you from meetings, work, or from getting things needed for daily living?: No  Journalist, newspaper / Equipment Home Equipment: Shower seat  Prior Device Use: Indicate devices/aids used by the patient prior to current illness, exacerbation or injury? None of the above  Current Functional Level Cognition  Arousal/Alertness: Awake/alert Overall Cognitive Status: Impaired/Different from baseline Orientation Level: Oriented X4 Attention: Sustained Sustained Attention: Impaired Sustained Attention Impairment: Verbal basic, Functional basic Memory: Impaired Memory Impairment: Decreased recall of new information Awareness: Impaired Awareness Impairment: Emergent impairment Problem Solving: Impaired Problem Solving Impairment: Functional basic, Verbal basic Executive Function: Reasoning Reasoning: Impaired Reasoning Impairment: Verbal basic, Functional basic    Extremity Assessment (includes Sensation/Coordination)  Upper Extremity Assessment: LUE deficits/detail RUE Deficits / Details: ROM is WFL, strength is grossly  4/5. RUE: Shoulder pain with ROM RUE Coordination: decreased fine motor LUE Deficits / Details: Pt reporting pain in LUE intially and has decrease in AROM but then reported no challenges at the end of session LUE: Shoulder pain with ROM LUE Coordination: decreased fine motor, decreased gross motor  Lower Extremity Assessment: Generalized weakness, LLE deficits/detail, RLE deficits/detail RLE  Deficits / Details: knee extension 4/5, knee flexion 3+/5, hip abd/add 3/5, at least 3/5 DF/PF LLE Deficits / Details: knee extension 3+/5, knee flexion 3+/5, hip abd/add 3/5, at least 3/5 DF/PF LLE Coordination: decreased gross motor    ADLs  Overall ADL's : Needs assistance/impaired Eating/Feeding: Set up, Sitting Grooming: Contact guard assist, Sitting Upper Body Bathing: Set up, Sitting Lower Body Bathing: Contact guard assist, Sit to/from stand Upper Body Dressing : Contact guard assist, Sitting Lower Body Dressing: Contact guard assist, Sit to/from stand Toilet Transfer: Contact guard assist, Cueing for safety, Cueing for sequencing Toileting- Clothing Manipulation and Hygiene: Contact guard assist, Minimal assistance Toileting - Clothing Manipulation Details (indicate cue type and reason): min assist to ensure clean Tub/ Shower Transfer: Walk-in shower, Contact guard assist, Minimal assistance, Rolling walker (2 wheels) Functional mobility during ADLs: Contact guard assist, Minimal assistance, Rolling walker (2 wheels) General ADL Comments: limited by posterior bias, impaired cognition and global weakness    Mobility  Overal bed mobility: Needs Assistance Bed Mobility: Supine to Sit, Sit to Supine Supine to sit: Contact guard, HOB elevated Sit to supine: Contact guard assist, HOB elevated General bed mobility comments: Extra time and cues to push up on arms to ascend trunk to sit L EOB, CGA for safety. CGA for safety with return to supine, noted increased ease compared to supine to sit  transition.    Transfers  Overall transfer level: Needs assistance Equipment used: Rolling walker (2 wheels) Transfers: Sit to/from Stand Sit to Stand: Contact guard assist Bed to/from chair/wheelchair/BSC transfer type:: Step pivot Step pivot transfers: Mod assist, +2 physical assistance, +2 safety/equipment General transfer comment: Pt performed first transfer pushing up on RW. Cued pt to repeat a second rep by pushing up from EOB with noted improved ease, speed, and stability with transfer. Pt also voiced understanding the benefits of pushing up from the sitting surface instead. CGA for safety    Ambulation / Gait / Stairs / Wheelchair Mobility  Ambulation/Gait Ambulation/Gait assistance: Min assist, Contact guard assist, Mod assist Gait Distance (Feet): 180 Feet Assistive device: Rolling walker (2 wheels), None Gait Pattern/deviations: Step-through pattern, Decreased stride length, Trunk flexed, Staggering left, Staggering right, Narrow base of support General Gait Details: Pt ambulated slowly with small bil steps with only CGA when using the RW, no longer displaying a posterior bias. However, when RW was removed, pt displayed multiple lateral LOB staggers in which he needed min-modA to recover, particulary when trying to turn his head L <> R when ambulating or when he was distracted. Repeated cues needed to increase speed, widen stance, and improve stride length with mod momentary success noted. Pt needed minA for balance fairly consistently when not using a RW Gait velocity: reduced Gait velocity interpretation: <1.31 ft/sec, indicative of household ambulator    Posture / Balance Dynamic Sitting Balance Sitting balance - Comments: supervision sitting statically EOB Balance Overall balance assessment: Needs assistance Sitting-balance support: No upper extremity supported, Feet supported Sitting balance-Leahy Scale: Good Sitting balance - Comments: supervision sitting statically  EOB Postural control: Posterior lean Standing balance support: Bilateral upper extremity supported, No upper extremity supported, During functional activity Standing balance-Leahy Scale: Fair Standing balance comment: Able to ambulate CGA with RW but had multiple LOB when trying to ambulate without UE support, needing min-modA to recover    Special considerations/life events  CIWA protocol   Previous Home Environment  Living Arrangements: Spouse/significant other  Lives With: Spouse Available Help at Discharge: Family, Available 24 hours/day  Type of Home: House Home Layout: One level Home Access: Stairs to enter Entrance Stairs-Rails: Can reach both Entrance Stairs-Number of Steps: 6 Bathroom Shower/Tub: Associate Professor: Yes How Accessible: Accessible via walker Home Care Services: No Additional Comments: clarified with wife  Discharge Living Setting Plans for Discharge Living Setting: Patient's home, Lives with (comment) (wife) Type of Home at Discharge: House Discharge Home Layout: One level Discharge Home Access: Stairs to enter Entrance Stairs-Rails: Can reach both Entrance Stairs-Number of Steps: 6 Discharge Bathroom Shower/Tub: Tub/shower unit Discharge Bathroom Toilet: Standard Discharge Bathroom Accessibility: Yes How Accessible: Accessible via walker Does the patient have any problems obtaining your medications?: No  Social/Family/Support Systems Patient Roles: Spouse, Parent Contact Information: wife, Heron and daughter Arlinda. Anticipated Caregiver: wife and family Anticipated Caregiver's Contact Information: see contacts Ability/Limitations of Caregiver: wife does not drive, she uses RW in the community only Caregiver Availability: 24/7 Discharge Plan Discussed with Primary Caregiver: Yes Is Caregiver In Agreement with Plan?: Yes Does Caregiver/Family have Issues with Lodging/Transportation while Pt is in  Rehab?: No  Goals Patient/Family Goal for Rehab: supervision with PT, OT and SLP Expected length of stay: ELOS 10 to 14 days Additional Information: daughter to cut his ectensive fingernails, has alwyas had strong fingernails  nickname  Tiger Pt/Family Agrees to Admission and willing to participate: Yes Program Orientation Provided & Reviewed with Pt/Caregiver Including Roles  & Responsibilities: Yes  Decrease burden of Care through IP rehab admission: n/a  Possible need for SNF placement upon discharge: not anticipated  Patient Condition: I have reviewed medical records from [facility], spoken with  patient and spouse. I met with patient at the bedside and discussed via phone for inpatient rehabilitation assessment.  Patient will benefit from ongoing PT, OT, and SLP, can actively participate in 3 hours of therapy a day 5 days of the week, and can make measurable gains during the admission.  Patient will also benefit from the coordinated team approach during an Inpatient Acute Rehabilitation admission.  The patient will receive intensive therapy as well as Rehabilitation physician, nursing, social worker, and care management interventions.  Due to bladder management, bowel management, safety, skin/wound care, disease management, medication administration, pain management, and patient education the patient requires 24 hour a day rehabilitation nursing.  The patient is currently *** with mobility and basic ADLs.  Discharge setting and therapy post discharge at home with home health is anticipated.  Patient has agreed to participate in the Acute Inpatient Rehabilitation Program and will admit today.  Preadmission Screen Completed By:  Alison Heron Lot, RN MSN 03/11/2024 4:24 PM ______________________________________________________________________   Discussed status with Dr. PIERRETTE on *** at *** and received approval for admission today.  Admission Coordinator:  Alison Heron Lot,  RN MSN time PIERRETTEPattricia ***   Assessment/Plan: Diagnosis: *** Does the need for close, 24 hr/day Medical supervision in concert with the patient's rehab needs make it unreasonable for this patient to be served in a less intensive setting? {yes_no_potentially:3041433} Co-Morbidities requiring supervision/potential complications: *** Due to {due un:6958565}, does the patient require 24 hr/day rehab nursing? {yes_no_potentially:3041433} Does the patient require coordinated care of a physician, rehab nurse, PT, OT, and SLP to address physical and functional deficits in the context of the above medical diagnosis(es)? {yes_no_potentially:3041433} Addressing deficits in the following areas: {deficits:3041436} Can the patient actively participate in an intensive therapy program of at least 3 hrs of therapy 5 days a week? {yes_no_potentially:3041433} The potential for patient to make  measurable gains while on inpatient rehab is {potential:3041437} Anticipated functional outcomes upon discharge from inpatient rehab: {functional outcomes:304600100} PT, {functional outcomes:304600100} OT, {functional outcomes:304600100} SLP Estimated rehab length of stay to reach the above functional goals is: *** Anticipated discharge destination: {anticipated dc setting:21604} 10. Overall Rehab/Functional Prognosis: {potential:3041437}   MD Signature: ***

## 2024-03-11 NOTE — Progress Notes (Signed)
 Physical Therapy Treatment Patient Details Name: Casey Warren MRN: [id number] DOB: [birth year] Today's Date: 03/11/2024   History of Present Illness Pt is 63 yo with hypotension referred from PCP on 9/23. 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 MRI 9/28 shows Acute infarcts with restricted diffusion in the R BG, L corona radiata, and L medial temporal lobe. PMH significant of seizures, PUD, GERD, hypertension, anemia, alcohol use, anxiety.    PT Comments  The pt is making good, quick progress as he did not display a posterior bias today and was able to ambulate slowly with a RW at a CGA level. At baseline, pt is independent without DME, thus began to progress pt towards ambulating without UE support today to try to promote return to baseline. However, when trying to ambulate without UE support, the pt walks even slower and had multiple lateral LOB bouts, resulting in him needing minA-modA to recover and frequent minA to ambulate without UE support. The pt would often lose his balance when distracted or when turning his head. He continues to display deficits in cognition and balance that place him at high risk for falls and injury. He is highly motivated to participate and improve to return to being independent, and therefore he could greatly benefit from intensive inpatient rehab, > 3 hours/day. Will continue to follow acutely.     If plan is discharge home, recommend the following: Assist for transportation;Help with stairs or ramp for entrance;Supervision due to cognitive status;A lot of help with walking and/or transfers;A lot of help with bathing/dressing/bathroom;Assistance with cooking/housework   Can travel by private vehicle     Yes  Equipment Recommendations  Rolling walker (2 wheels);BSC/3in1    Recommendations for Other Services       Precautions /  Restrictions Precautions Precautions: Fall Recall of Precautions/Restrictions: Impaired Restrictions Weight Bearing Restrictions Per Provider Order: No     Mobility  Bed Mobility Overal bed mobility: Needs Assistance Bed Mobility: Supine to Sit, Sit to Supine     Supine to sit: Contact guard, HOB elevated Sit to supine: Contact guard assist, HOB elevated   General bed mobility comments: Extra time and cues to push up on arms to ascend trunk to sit L EOB, CGA for safety. CGA for safety with return to supine, noted increased ease compared to supine to sit transition.    Transfers Overall transfer level: Needs assistance Equipment used: Rolling walker (2 wheels) Transfers: Sit to/from Stand Sit to Stand: Contact guard assist           General transfer comment: Pt performed first transfer pushing up on RW. Cued pt to repeat a second rep by pushing up from EOB with noted improved ease, speed, and stability with transfer. Pt also voiced understanding the benefits of pushing up from the sitting surface instead. CGA for safety    Ambulation/Gait Ambulation/Gait assistance: Min assist, Contact guard assist, Mod assist Gait Distance (Feet): 180 Feet Assistive device: Rolling walker (2 wheels), None Gait Pattern/deviations: Step-through pattern, Decreased stride length, Trunk flexed, Staggering left, Staggering right, Narrow base of support Gait velocity: reduced Gait velocity interpretation: <1.31 ft/sec, indicative of household ambulator   General Gait Details: Pt ambulated slowly with small bil steps with only CGA when using the RW, no longer displaying a posterior bias. However, when RW was removed, pt displayed multiple lateral LOB staggers in which he needed min-modA  to recover, particulary when trying to turn his head L <> R when ambulating or when he was distracted. Repeated cues needed to increase speed, widen stance, and improve stride length with mod momentary success noted.  Pt needed minA for balance fairly consistently when not using a RW   Stairs             Wheelchair Mobility     Tilt Bed    Modified Rankin (Stroke Patients Only) Modified Rankin (Stroke Patients Only) Pre-Morbid Rankin Score: No significant disability Modified Rankin: Moderately severe disability     Balance Overall balance assessment: Needs assistance Sitting-balance support: No upper extremity supported, Feet supported Sitting balance-Leahy Scale: Good Sitting balance - Comments: supervision sitting statically EOB   Standing balance support: Bilateral upper extremity supported, No upper extremity supported, During functional activity Standing balance-Leahy Scale: Fair Standing balance comment: Able to ambulate CGA with RW but had multiple LOB when trying to ambulate without UE support, needing min-modA to recover                            Communication Communication Communication: Impaired Factors Affecting Communication: Reduced clarity of speech  Cognition Arousal: Alert Behavior During Therapy: WFL for tasks assessed/performed   PT - Cognitive impairments: Sequencing, Safety/Judgement, Problem solving, Memory                       PT - Cognition Comments: Pt pleasant and agreeable. Pt pulled off condom cath prior to PT arrival, suggesting poor awareness. Needs repeated cues to widen stance to improve stability and safety with mobility. Following commands: Impaired Following commands impaired: Follows one step commands with increased time    Cueing Cueing Techniques: Verbal cues, Gestural cues, Tactile cues  Exercises      General Comments        Pertinent Vitals/Pain Pain Assessment Pain Assessment: No/denies pain    Home Living                          Prior Function            PT Goals (current goals can now be found in the care plan section) Acute Rehab PT Goals Patient Stated Goal: to go home PT Goal  Formulation: With patient Time For Goal Achievement: 03/17/24 Potential to Achieve Goals: Good Progress towards PT goals: Progressing toward goals    Frequency    Min 2X/week      PT Plan      Co-evaluation              AM-PAC PT 6 Clicks Mobility   Outcome Measure  Help needed turning from your back to your side while in a flat bed without using bedrails?: A Little Help needed moving from lying on your back to sitting on the side of a flat bed without using bedrails?: A Little Help needed moving to and from a bed to a chair (including a wheelchair)?: A Little Help needed standing up from a chair using your arms (e.g., wheelchair or bedside chair)?: A Little Help needed to walk in hospital room?: A Lot Help needed climbing 3-5 steps with a railing? : Total 6 Click Score: 15    End of Session Equipment Utilized During Treatment: Gait belt Activity Tolerance: Patient tolerated treatment well Patient left: with call bell/phone within reach;in bed;with bed alarm set Nurse Communication: Mobility status PT Visit  Diagnosis: Unsteadiness on feet (R26.81);Other abnormalities of gait and mobility (R26.89);Muscle weakness (generalized) (M62.81);Difficulty in walking, not elsewhere classified (R26.2)     Time: 8457-8444 PT Time Calculation (min) (ACUTE ONLY): 13 min  Charges:    $Gait Training: 8-22 mins PT General Charges $$ ACUTE PT VISIT: 1 Visit                     Theo Ferretti, PT, DPT Acute Rehabilitation Services  Office: [phone]    Theo CHRISTELLA Ferretti 03/11/2024, 4:07 PM

## 2024-03-12 DIAGNOSIS — E861 Hypovolemia: Secondary | ICD-10-CM | POA: Diagnosis not present

## 2024-03-12 LAB — CBC WITH DIFFERENTIAL/PLATELET
Abs Immature Granulocytes: 0.41 K/uL — ABNORMAL HIGH (ref 0.00–0.07)
Basophils Absolute: 0.1 K/uL (ref 0.0–0.1)
Basophils Relative: 0 %
Eosinophils Absolute: 0.2 K/uL (ref 0.0–0.5)
Eosinophils Relative: 2 %
HCT: 31.8 % — ABNORMAL LOW (ref 39.0–52.0)
Hemoglobin: 11.1 g/dL — ABNORMAL LOW (ref 13.0–17.0)
Immature Granulocytes: 3 %
Lymphocytes Relative: 21 %
Lymphs Abs: 3.2 K/uL (ref 0.7–4.0)
MCH: 30.1 pg (ref 26.0–34.0)
MCHC: 34.9 g/dL (ref 30.0–36.0)
MCV: 86.2 fL (ref 80.0–100.0)
Monocytes Absolute: 1.2 K/uL — ABNORMAL HIGH (ref 0.1–1.0)
Monocytes Relative: 8 %
Neutro Abs: 10 K/uL — ABNORMAL HIGH (ref 1.7–7.7)
Neutrophils Relative %: 66 %
Platelets: 243 K/uL (ref 150–400)
RBC: 3.69 MIL/uL — ABNORMAL LOW (ref 4.22–5.81)
RDW: 13.4 % (ref 11.5–15.5)
WBC: 15 K/uL — ABNORMAL HIGH (ref 4.0–10.5)
nRBC: 0 % (ref 0.0–0.2)

## 2024-03-12 LAB — COMPREHENSIVE METABOLIC PANEL WITH GFR
ALT: 326 U/L — ABNORMAL HIGH (ref 0–44)
AST: 109 U/L — ABNORMAL HIGH (ref 15–41)
Albumin: 2.5 g/dL — ABNORMAL LOW (ref 3.5–5.0)
Alkaline Phosphatase: 120 U/L (ref 38–126)
Anion gap: 9 (ref 5–15)
BUN: 9 mg/dL (ref 8–23)
CO2: 16 mmol/L — ABNORMAL LOW (ref 22–32)
Calcium: 8.2 mg/dL — ABNORMAL LOW (ref 8.9–10.3)
Chloride: 116 mmol/L — ABNORMAL HIGH (ref 98–111)
Creatinine, Ser: 0.92 mg/dL (ref 0.61–1.24)
GFR, Estimated: >60 mL/min (ref >60)
Glucose, Bld: 99 mg/dL (ref 70–99)
Potassium: 3.5 mmol/L (ref 3.5–5.1)
Sodium: 141 mmol/L (ref 135–145)
Total Bilirubin: 0.3 mg/dL (ref 0.0–1.2)
Total Protein: 5.5 g/dL — ABNORMAL LOW (ref 6.5–8.1)

## 2024-03-12 LAB — PHOSPHORUS
Phosphorus: 3.7 mg/dL (ref 2.5–4.6)
Phosphorus: 3.8 mg/dL (ref 2.5–4.6)

## 2024-03-12 LAB — MAGNESIUM
Magnesium: 1.9 mg/dL (ref 1.7–2.4)
Magnesium: 1.5 mg/dL — ABNORMAL LOW (ref 1.7–2.4)

## 2024-03-12 MED ORDER — SODIUM BICARBONATE 650 MG PO TABS
650.0000 mg | ORAL_TABLET | Freq: Two times a day (BID) | ORAL | Status: DC
  Administered 2024-03-12 – 2024-03-16 (×9): 650 mg via ORAL
  Filled 2024-03-12 (×9): qty 1

## 2024-03-12 MED ORDER — ONDANSETRON HCL 4 MG/2ML IJ SOLN: 4.0000 mg | Freq: Four times a day (QID) | INTRAMUSCULAR | Status: DC | PRN

## 2024-03-12 MED ORDER — ENOXAPARIN SODIUM 40 MG/0.4ML IJ SOSY
40.0000 mg | PREFILLED_SYRINGE | INTRAMUSCULAR | Status: DC
  Administered 2024-03-12 – 2024-03-16 (×5): 40 mg via SUBCUTANEOUS
  Filled 2024-03-12 (×5): qty 0.4

## 2024-03-12 MED ORDER — LACTATED RINGERS IV BOLUS
1000.0000 mL | Freq: Once | INTRAVENOUS | Status: AC
  Administered 2024-03-12: 1000 mL via INTRAVENOUS

## 2024-03-12 MED ORDER — ASPIRIN 81 MG PO TBEC
81.0000 mg | DELAYED_RELEASE_TABLET | Freq: Every day | ORAL | Status: DC
  Administered 2024-03-12 – 2024-03-16 (×5): 81 mg via ORAL
  Filled 2024-03-12 (×5): qty 1

## 2024-03-12 MED ORDER — THIAMINE MONONITRATE 100 MG PO TABS
200.0000 mg | ORAL_TABLET | Freq: Every day | ORAL | Status: DC
  Administered 2024-03-12 – 2024-03-16 (×5): 200 mg via ORAL
  Filled 2024-03-12 (×5): qty 2

## 2024-03-12 NOTE — Progress Notes (Addendum)
   Inpatient Rehabilitation Admissions Coordinator   I called [phone] option 2 to set up peer to peer with [facility] MD with Dr Tobie per [facility] request.  Heron Leavell, RN, MSN Rehab Admissions Coordinator [phone] 03/12/2024 11:18 AM   We have received a denial for CIR admit. Lacks medical need for hospital level rehab.. I met with patient , called his wife and daughter, Seamone and they are aware. Family would like to pursue SNF rehab. I will alert acute team and TOC . We will sign off.  Heron Leavell, RN, MSN Rehab Admissions Coordinator [phone] 03/12/2024 12:59 PM

## 2024-03-12 NOTE — Progress Notes (Signed)
 [facility] Progress Note Patient: Casey Warren FMW:[id number] DOB: 04-03-1961  DOA: 03/02/2024 DOS: the patient was seen and examined on 03/12/2024  Brief Hospital Course: KRISTINA BERTONE is a 63 y.o. male with PMH of  hypertension, hyperlipidemia, chronic anxiety/depression, alcoholism, who presents to the ER, referred by his PCP due to low blood pressures of unclear etiology. Per family members at bedside he has cut down on alcohol consumption.  Last alcoholic drink was about a week ago. In the ER, hypotensive with SBP's in the 70's.  Creatinine elevated 4.14 above baseline.  Alert and oriented x 2.  Received 2L IVF with improvement of his BP.   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  No acute intracranial abnormality.He has had a decline in memory for years.  Assessment and plan: Acute encephalopathy/agitation/delirium Alcohol abuse, w/ withdrawals Recent cognitive decline last Etoh with wine bottle on 02/26/24 per daughter,normally heavy drinker-he had weaned himself from beer for a month. Psych consulted. Treated with CIWA protocol and high-dose thiamine . MRI brain shows acute infarct in right basal ganglia, left corona radiata as well as medial temporal lobe and left. PT OT recommending CIR, insurance denied CIR.  Awaiting SNF now.  Hyperthyroidism Possible hypothyroid crisis Relative adrenal insufficiency w/ low cortisol: TSH undetectable T39.8 Free T4 3.25. TSI 5.08.  Elevated. Thyrotropin receptor antibody 8.45 elevated. A.m. cortisol at 5 AM was low, but Cosyntropin  test reassuring  Prior provider, discussed with outpatient [facility] Dr Mercie US  thryoid moderately heterogenous thyroid  gland- likely thyroiditis. Received PTU initially with and now on methimazole . Continue atenolol .  Also on Decadron  last dose of therapy 10/2.  Acute kidney  injury Metabolic acidosis Hypokalemia Hypophosphatemia. Hypomagnesemia. Hypernatremia. AKI in the setting of hypotension/poor oral intake.  Creatinine improved with IV hydration. Electrolyte being replaced.  Hypotension History of hypertension-PTA on amlodipine /valsartan /HCTZ: Blood pressure now stable.  Continue atenolol .  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 Neurology was consulted. Currently on aspirin  and Plavix for 3 weeks followed by Plavix alone. Require 30-day heart monitor after discharge. CIR following.   Chronic anxiety/depression Psychiatry was consulted.  Now signed off. Currently on Lexapro  and Haldol  as needed for agitation.  Lung nodules: 3 mm lingular, 5 mm right upper lobe Follow-up in 12 month w/ CT chest  Severe protein-calorie malnutrition. Refeeding syndrome. Continue supplementation. Had a Cortrak which is now removed.  LFT elevation. Levels fluctuating.  Bilirubin normal.  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  Monitor.  Will give IV hydration to monitor for improvement in liver function.  HLD. On statin.  Due to stroke. Monitor while LFTs elevation.  Tension headache. Continue Topamax .  Steroid-induced leukocytosis. No evidence of clinical infection.  Monitor.  Non-anion gap metabolic acidosis. Likely from hyperchloremia.  As well as diarrhea. Patient remains asymptomatic.  Possible pneumonia. Treated with ceftriaxone azithromycin for 5 days. Monitor.  Antibiotic induced diarrhea. Antibiotic is now stopped. Imodium x 1. Monitor.   Subjective: No nausea no vomiting.  Diarrhea improving.  No abdominal pain.  Minimal oral intake.  Physical Exam: Clear to auscultation for S1-S2 present Bowel sound present.  Nontender. No edema.  Data Reviewed: I have Reviewed nursing notes, Vitals, and Lab results. Since last encounter, pertinent lab results CBC and BMP   . I have ordered  test including CBC and BMP  .  Discussed with insurance provider for peer  to peer  Disposition: Status is: Inpatient Remains inpatient appropriate because: Monitor for improvement in mentation and placement  enoxaparin  (LOVENOX ) injection 40 mg Start: 03/12/24 1000   Family Communication: No one at bedside Level of care: Progressive   Vitals:   03/12/24 0745 03/12/24 1213 03/12/24 1500 03/12/24 1720  BP: 126/84 125/79  102/70  Pulse: 100 92  96  Resp: 20 18 (!) 24 17  Temp: 98.1 F (36.7 C) 98.1 F (36.7 C)  98.1 F (36.7 C)  TempSrc: Oral Axillary  Oral  SpO2: 99% 100%  96%  Weight:      Height:         Author: Yetta Blanch, MD 03/12/2024 6:24 PM  Please look on [email or web address] to find out who is on call.

## 2024-03-12 NOTE — Progress Notes (Addendum)
 Speech Language Pathology Treatment: Dysphagia;Cognitive-Linguistic  Patient Details Name: Casey Warren MRN: [id number] DOB: [birth year] Today's Date: 03/12/2024 Time: 8981-8961 SLP Time Calculation (min) (ACUTE ONLY): 20 min  Assessment / Plan / Recommendation Clinical Impression  Pt did not recall having difficulty masticating yesterday and diet downgraded to Dys 2. Pt does have decreased awareness of deficits however he reported he does not like having all food chopped, more difficult to eat and less likely to eat Dys 2. Also stated he does not like the volume of food on tray and typically eats much less at home at baseline. Observed with simulated Dys 2 and a graham cracker. Mastication only minimally delayed, used liquid wash x1 and no oral residue. No s/s aspiration with thin via straw. SLP upgraded texture back to Dys 3 and ST will continue to follow. Discussed with RN and dietitian that his intake was decreased prior to admission. Pt unaware he had a stroke, and stated he wants to go to a place to work out and wife wants me to go when asked what needs to improve to return to independence. Verbal problem solving needed mild prompts for hypothetical situations. Assisted pt in ordering lunch; unable to read menu due to small print but able to state what he wanted. He was able to sustain attention (while on hold), dialed number for nutrition services and ordered lunch independently.     HPI HPI: 63 yo male referred from PCP 9/23 for hypotension. Initial CTH negative, though MRI 9/28 shows acute infarcts with restricted diffusion in the R basal ganglia, L coronia radiata, and L medial temporal lobe. Previously on  a soft diet with Cortrak placed 9/26 after pt's intake was variable secondary to mentation/EtOH withdrawal. Made NPO after MRI findings and SLP consulted 9/29. PMH includes seizures, PUD, GERD, HTN, anemia, alcohol use, anxiety      SLP Plan  Continue with current plan of care           Recommendations  Diet recommendations: Dysphagia 3 (mechanical soft);Thin liquid Liquids provided via: Cup;Straw Medication Administration: Whole meds with liquid Supervision: Patient able to self feed;Intermittent supervision to cue for compensatory strategies Compensations: Slow rate;Small sips/bites Postural Changes and/or Swallow Maneuvers: Seated upright 90 degrees                  Oral care BID   Intermittent Supervision/Assistance Dysphagia, oral phase (R13.11);Cognitive communication deficit (M58.158)     Continue with current plan of care     Dustin Olam Bull  03/12/2024, 10:45 AM

## 2024-03-12 NOTE — Plan of Care (Signed)
   Problem: Education: Goal: Knowledge of General Education information will improve Description Including pain rating scale, medication(s)/side effects and non-pharmacologic comfort measures Outcome: Progressing   Problem: Health Behavior/Discharge Planning: Goal: Ability to manage health-related needs will improve Outcome: Progressing

## 2024-03-12 NOTE — TOC Progression Note (Signed)
 Transition of Care [facility]) - Progression Note    Patient Details  Name: Casey Warren MRN: [id number] Date of Birth: 29-May-1961  Transition of Care [facility]) CM/SW Contact  Montie LOISE Louder, [place] Phone Number: 03/12/2024, 4:32 PM  Clinical Narrative:     CSW received message from [facility]- advised patient's insurance denied CIR, patient and family will like to pursue SNF.  SNF referrals sent out  [facility] will provide bed offers once available.   Montie Louder, MSW, LCSW Clinical Social Worker     Expected Discharge Plan: Skilled Nursing Facility Barriers to Discharge: Continued Medical Work up, SNF Pending bed offer               Expected Discharge Plan and Services In-house Referral: Clinical Social Work     Living arrangements for the past 2 months: Single Family Home                                       Social Drivers of Health (SDOH) Interventions SDOH Screenings   Food Insecurity: No Food Insecurity (03/03/2024)  Housing: Low Risk  (03/03/2024)  Transportation Needs: No Transportation Needs (03/03/2024)  Utilities: Not At Risk (03/03/2024)  Depression (PHQ2-9): Low Risk  (07/29/2023)  Social Connections: Moderately Integrated (03/03/2024)  Tobacco Use: High Risk (03/02/2024)    Readmission Risk Interventions     No data to display

## 2024-03-12 NOTE — Progress Notes (Signed)
 Physical Therapy Treatment Patient Details Name: Casey Warren MRN: [id number] DOB: [birth year] Today's Date: 03/12/2024   History of Present Illness Pt is 63 yo with hypotension referred from PCP on 9/23. 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 MRI 9/28 shows Acute infarcts with restricted diffusion in the R BG, L corona radiata, and L medial temporal lobe. PMH significant of seizures, PUD, GERD, hypertension, anemia, alcohol use, anxiety.    PT Comments  The pt continues to make good progress with mobility, and continued to make good progress with session focused on balance training with gait. Pt continues to need up to modA at times with balance challenge, and had x3 LOB with attempting step ups onto 4 inch mat due to incoordinated movements. Given deficits in strength, mobility, safety awareness, stability, and balance reactions, continue to recommend intensive therapies after d/c, but due to insurance denial, pt will need <3hour/day post-acute rehab once medically stable for d/c as he is not safe to return home at this time.    If plan is discharge home, recommend the following: Assist for transportation;Help with stairs or ramp for entrance;Supervision due to cognitive status;A lot of help with walking and/or transfers;A lot of help with bathing/dressing/bathroom;Assistance with cooking/housework   Can travel by private vehicle     Yes  Equipment Recommendations  Rolling walker (2 wheels);BSC/3in1    Recommendations for Other Services       Precautions / Restrictions Precautions Precautions: Fall Recall of Precautions/Restrictions: Impaired Restrictions Weight Bearing Restrictions Per Provider Order: No     Mobility  Bed Mobility Overal bed mobility: Needs Assistance Bed Mobility: Supine to Sit, Sit to Supine     Supine to sit: Contact guard, HOB  elevated Sit to supine: Contact guard assist, HOB elevated   General bed mobility comments: Extra time and cues to push up on arms to ascend trunk to sit L EOB, CGA for safety. CGA for safety with return to supine, noted increased ease compared to supine to sit transition.    Transfers Overall transfer level: Needs assistance Equipment used: Rolling walker (2 wheels), None Transfers: Sit to/from Stand Sit to Stand: Contact guard assist   Step pivot transfers: Contact guard assist       General transfer comment: CGA to rise and steady with cues for hand placement. completed x1 with RW and x3 without.    Ambulation/Gait Ambulation/Gait assistance: Min assist, Contact guard assist, Mod assist Gait Distance (Feet): 250 Feet Assistive device: Rolling walker (2 wheels), None Gait Pattern/deviations: Decreased stride length, Trunk flexed, Staggering left, Staggering right, Narrow base of support, Decreased step length - left, Step-to pattern, Step-through pattern, Shuffle Gait velocity: 0.21 m/s Gait velocity interpretation: <1.31 ft/sec, indicative of household ambulator   General Gait Details: pt with small steps and intermittent step-to with LLE. largely CGA with RW, up to min-modA without DME. significantly slowed with dual tasking and balance challenge   Stairs             Wheelchair Mobility     Tilt Bed    Modified Rankin (Stroke Patients Only) Modified Rankin (Stroke Patients Only) Pre-Morbid Rankin Score: No significant disability Modified Rankin: Moderately severe disability     Balance Overall balance assessment: Needs assistance Sitting-balance support: No upper extremity supported, Feet supported Sitting balance-Leahy Scale: Good Sitting balance - Comments: supervision sitting statically EOB   Standing balance support:  Bilateral upper extremity supported, No upper extremity supported, During functional activity Standing balance-Leahy Scale:  Fair Standing balance comment: Able to ambulate CGA with RW but had multiple LOB when trying to ambulate without UE support, needing min-modA to recover             High level balance activites: Side stepping, Backward walking, Direction changes, Turns, Head turns, Other (comment) High Level Balance Comments: up to modA to correct LOB, most assist with backwards walking or stepping up onto mat            Communication Communication Communication: Impaired Factors Affecting Communication: Reduced clarity of speech  Cognition Arousal: Alert Behavior During Therapy: WFL for tasks assessed/performed   PT - Cognitive impairments: Sequencing, Safety/Judgement, Problem solving, Memory, Awareness, Attention                       PT - Cognition Comments: Pt pleasant and agreeable. repeated cues for technique with mobility, and repeated cues for directions with balance testing Following commands: Impaired Following commands impaired: Follows one step commands with increased time    Cueing Cueing Techniques: Verbal cues, Gestural cues, Tactile cues  Exercises      General Comments General comments (skin integrity, edema, etc.): VSS on RA      Pertinent Vitals/Pain Pain Assessment Pain Assessment: No/denies pain Pain Intervention(s): Monitored during session    Home Living                          Prior Function            PT Goals (current goals can now be found in the care plan section) Acute Rehab PT Goals Patient Stated Goal: to go home PT Goal Formulation: With patient Time For Goal Achievement: 03/17/24 Potential to Achieve Goals: Good Progress towards PT goals: Progressing toward goals    Frequency    Min 2X/week      PT Plan      Co-evaluation              AM-PAC PT 6 Clicks Mobility   Outcome Measure  Help needed turning from your back to your side while in a flat bed without using bedrails?: A Little Help needed moving  from lying on your back to sitting on the side of a flat bed without using bedrails?: A Little Help needed moving to and from a bed to a chair (including a wheelchair)?: A Little Help needed standing up from a chair using your arms (e.g., wheelchair or bedside chair)?: A Little Help needed to walk in hospital room?: A Lot Help needed climbing 3-5 steps with a railing? : Total 6 Click Score: 15    End of Session Equipment Utilized During Treatment: Gait belt Activity Tolerance: Patient tolerated treatment well Patient left: with call bell/phone within reach;in bed;with bed alarm set Nurse Communication: Mobility status PT Visit Diagnosis: Unsteadiness on feet (R26.81);Other abnormalities of gait and mobility (R26.89);Muscle weakness (generalized) (M62.81);Difficulty in walking, not elsewhere classified (R26.2)     Time: 8647-8585 PT Time Calculation (min) (ACUTE ONLY): 22 min  Charges:    $Therapeutic Exercise: 8-22 mins PT General Charges $$ ACUTE PT VISIT: 1 Visit                     Izetta Call, PT, DPT   Acute Rehabilitation Department Office [phone] Secure Chat Communication Preferred   Izetta JULIANNA Call 03/12/2024, 4:10 PM

## 2024-03-13 DIAGNOSIS — E861 Hypovolemia: Secondary | ICD-10-CM | POA: Diagnosis not present

## 2024-03-13 LAB — COMPREHENSIVE METABOLIC PANEL WITH GFR
ALT: 253 U/L — ABNORMAL HIGH (ref 0–44)
AST: 61 U/L — ABNORMAL HIGH (ref 15–41)
Albumin: 2.4 g/dL — ABNORMAL LOW (ref 3.5–5.0)
Alkaline Phosphatase: 115 U/L (ref 38–126)
Anion gap: 9 (ref 5–15)
BUN: 6 mg/dL — ABNORMAL LOW (ref 8–23)
CO2: 18 mmol/L — ABNORMAL LOW (ref 22–32)
Calcium: 8.3 mg/dL — ABNORMAL LOW (ref 8.9–10.3)
Chloride: 114 mmol/L — ABNORMAL HIGH (ref 98–111)
Creatinine, Ser: 0.95 mg/dL (ref 0.61–1.24)
GFR, Estimated: >60 mL/min (ref >60)
Glucose, Bld: 93 mg/dL (ref 70–99)
Potassium: 3.5 mmol/L (ref 3.5–5.1)
Sodium: 141 mmol/L (ref 135–145)
Total Bilirubin: 0.5 mg/dL (ref 0.0–1.2)
Total Protein: 5 g/dL — ABNORMAL LOW (ref 6.5–8.1)

## 2024-03-13 LAB — CBC WITH DIFFERENTIAL/PLATELET
Abs Immature Granulocytes: 0.25 K/uL — ABNORMAL HIGH (ref 0.00–0.07)
Basophils Absolute: 0 K/uL (ref 0.0–0.1)
Basophils Relative: 0 %
Eosinophils Absolute: 0.2 K/uL (ref 0.0–0.5)
Eosinophils Relative: 2 %
HCT: 30.8 % — ABNORMAL LOW (ref 39.0–52.0)
Hemoglobin: 10.7 g/dL — ABNORMAL LOW (ref 13.0–17.0)
Immature Granulocytes: 3 %
Lymphocytes Relative: 23 %
Lymphs Abs: 2.3 K/uL (ref 0.7–4.0)
MCH: 29.8 pg (ref 26.0–34.0)
MCHC: 34.7 g/dL (ref 30.0–36.0)
MCV: 85.8 fL (ref 80.0–100.0)
Monocytes Absolute: 1.1 K/uL — ABNORMAL HIGH (ref 0.1–1.0)
Monocytes Relative: 11 %
Neutro Abs: 6.2 K/uL (ref 1.7–7.7)
Neutrophils Relative %: 61 %
Platelets: 278 K/uL (ref 150–400)
RBC: 3.59 MIL/uL — ABNORMAL LOW (ref 4.22–5.81)
RDW: 13.7 % (ref 11.5–15.5)
WBC: 10 K/uL (ref 4.0–10.5)
nRBC: 0 % (ref 0.0–0.2)

## 2024-03-13 LAB — MAGNESIUM: Magnesium: 1.4 mg/dL — ABNORMAL LOW (ref 1.7–2.4)

## 2024-03-13 LAB — PHOSPHORUS: Phosphorus: 4.6 mg/dL (ref 2.5–4.6)

## 2024-03-13 MED ORDER — MAGNESIUM SULFATE 4 GM/100ML IV SOLN
4.0000 g | Freq: Once | INTRAVENOUS | Status: AC
  Administered 2024-03-13: 4 g via INTRAVENOUS
  Filled 2024-03-13: qty 100

## 2024-03-13 MED ORDER — MAGNESIUM CHLORIDE 64 MG PO TBEC
1.0000 | DELAYED_RELEASE_TABLET | Freq: Two times a day (BID) | ORAL | Status: DC
  Administered 2024-03-13 – 2024-03-16 (×7): 64 mg via ORAL
  Filled 2024-03-13 (×8): qty 1

## 2024-03-13 NOTE — Plan of Care (Signed)
  Problem: Education: Goal: Knowledge of General Education information will improve Description: Including pain rating scale, medication(s)/side effects and non-pharmacologic comfort measures 03/13/2024 0113 by Marvis Kenneth SAILOR, RN Outcome: Progressing 03/12/2024 2240 by Marvis Kenneth SAILOR, RN Outcome: Progressing   Problem: Health Behavior/Discharge Planning: Goal: Ability to manage health-related needs will improve 03/13/2024 0113 by Marvis Kenneth SAILOR, RN Outcome: Progressing 03/12/2024 2240 by Marvis Kenneth SAILOR, RN Outcome: Progressing   Problem: Clinical Measurements: Goal: Ability to maintain clinical measurements within normal limits will improve 03/13/2024 0113 by Marvis Kenneth SAILOR, RN Outcome: Progressing 03/12/2024 2240 by Marvis Kenneth SAILOR, RN Outcome: Progressing Goal: Will remain free from infection 03/13/2024 0113 by Marvis Kenneth SAILOR, RN Outcome: Progressing 03/12/2024 2240 by Marvis Kenneth SAILOR, RN Outcome: Progressing Goal: Diagnostic test results will improve 03/13/2024 0113 by Marvis Kenneth SAILOR, RN Outcome: Progressing 03/12/2024 2240 by Marvis Kenneth SAILOR, RN Outcome: Progressing Goal: Respiratory complications will improve 03/13/2024 0113 by Marvis Kenneth SAILOR, RN Outcome: Progressing 03/12/2024 2240 by Marvis Kenneth SAILOR, RN Outcome: Progressing Goal: Cardiovascular complication will be avoided 03/13/2024 0113 by Marvis Kenneth SAILOR, RN Outcome: Progressing 03/12/2024 2240 by Marvis Kenneth SAILOR, RN Outcome: Progressing   Problem: Activity: Goal: Risk for activity intolerance will decrease 03/13/2024 0113 by Marvis Kenneth SAILOR, RN Outcome: Progressing 03/12/2024 2240 by Marvis Kenneth SAILOR, RN Outcome: Progressing   Problem: Nutrition: Goal: Adequate nutrition will be maintained 03/13/2024 0113 by Marvis Kenneth SAILOR, RN Outcome: Progressing 03/12/2024 2240 by Marvis Kenneth SAILOR, RN Outcome: Progressing   Problem: Coping: Goal: Level of anxiety will  decrease 03/13/2024 0113 by Marvis Kenneth SAILOR, RN Outcome: Progressing 03/12/2024 2240 by Marvis Kenneth SAILOR, RN Outcome: Progressing   Problem: Elimination: Goal: Will not experience complications related to bowel motility 03/13/2024 0113 by Marvis Kenneth SAILOR, RN Outcome: Progressing 03/12/2024 2240 by Marvis Kenneth SAILOR, RN Outcome: Progressing Goal: Will not experience complications related to urinary retention 03/13/2024 0113 by Marvis Kenneth SAILOR, RN Outcome: Progressing 03/12/2024 2240 by Marvis Kenneth SAILOR, RN Outcome: Progressing   Problem: Pain Managment: Goal: General experience of comfort will improve and/or be controlled 03/13/2024 0113 by Marvis Kenneth SAILOR, RN Outcome: Progressing 03/12/2024 2240 by Marvis Kenneth SAILOR, RN Outcome: Progressing   Problem: Safety: Goal: Ability to remain free from injury will improve 03/13/2024 0113 by Marvis Kenneth SAILOR, RN Outcome: Progressing 03/12/2024 2240 by Marvis Kenneth SAILOR, RN Outcome: Progressing   Problem: Skin Integrity: Goal: Risk for impaired skin integrity will decrease 03/13/2024 0113 by Marvis Kenneth SAILOR, RN Outcome: Progressing 03/12/2024 2240 by Marvis Kenneth SAILOR, RN Outcome: Progressing

## 2024-03-13 NOTE — Plan of Care (Signed)

## 2024-03-13 NOTE — Progress Notes (Signed)
 [facility] Progress Note Patient: Casey Warren FMW:[id number] DOB: 24-Oct-1960  DOA: 03/02/2024 DOS: the patient was seen and examined on 03/13/2024  Brief Hospital Course: Casey Warren is a 63 y.o. male with PMH of  hypertension, hyperlipidemia, chronic anxiety/depression, alcoholism, who presents to the ER, referred by his PCP due to low blood pressures of unclear etiology. Per family members at bedside he has cut down on alcohol consumption.  Last alcoholic drink was about a week ago. In the ER, hypotensive with SBP's in the 70's.  Creatinine elevated 4.14 above baseline.  Alert and oriented x 2.  Received 2L IVF with improvement of his BP.   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  No acute intracranial abnormality.He has had a decline in memory for years.  Assessment and plan: Acute encephalopathy/agitation/delirium Alcohol abuse, w/ withdrawals Recent cognitive decline last Etoh with wine bottle on 02/26/24 per daughter,normally heavy drinker-he had weaned himself from beer for a month. Psych consulted. Treated with CIWA protocol and high-dose thiamine . MRI brain shows acute infarct in right basal ganglia, left corona radiata as well as medial temporal lobe and left. PT OT recommending CIR, insurance denied CIR.  Awaiting SNF now.  Hyperthyroidism Possible hypothyroid crisis Relative adrenal insufficiency w/ low cortisol: TSH undetectable T39.8 Free T4 3.25. TSI 5.08.  Elevated. Thyrotropin receptor antibody 8.45 elevated. A.m. cortisol at 5 AM was low, but Cosyntropin  test reassuring  Prior provider, discussed with outpatient Casey Warren  thryoid moderately heterogenous thyroid  gland- likely thyroiditis. Received PTU initially with and now on methimazole . Continue atenolol .  Also on Decadron  last dose of therapy 10/2.  Acute kidney  injury Metabolic acidosis Hypokalemia Hypophosphatemia. Hypomagnesemia. Hypernatremia. AKI in the setting of hypotension/poor oral intake.  Creatinine improved with IV hydration. Electrolyte being replaced.  Hypotension History of hypertension-PTA on amlodipine /valsartan /HCTZ: Blood pressure now stable.  Continue atenolol .  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 Neurology was consulted. Currently on aspirin  and Plavix for 3 weeks followed by Plavix alone. Require 30-day heart monitor after discharge. CIR following.   Chronic anxiety/depression Psychiatry was consulted.  Now signed off. Currently on Lexapro  and Haldol  as needed for agitation.  Lung nodules: 3 mm lingular, 5 mm right upper lobe Follow-up in 12 month w/ CT chest  Severe protein-calorie malnutrition. Refeeding syndrome. Continue supplementation. Had a Cortrak which is now removed.  LFT elevation. Levels fluctuating.  Bilirubin normal.  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  Monitor.  Improved with IV hydration.  Bilirubin remains normal.  HLD. On statin.  Due to stroke. Monitor while LFTs elevation.  Tension headache. Continue Topamax .  Steroid-induced leukocytosis. No evidence of clinical infection.  Monitor.  Non-anion gap metabolic acidosis. Likely from hyperchloremia.  As well as diarrhea. Patient remains asymptomatic.  Possible pneumonia. Treated with ceftriaxone azithromycin for 5 days. Monitor.  Antibiotic induced diarrhea. Antibiotic is now stopped. Imodium x 1. Monitor.   Subjective: No nausea no vomiting.  Reports 2-3 loose watery bowel movement yesterday.  No abdominal pain.  Oral intake adequate.  Physical Exam: Clear to auscultation. S1 is present Bowel sounds are present  no edema.  Data Reviewed: I have Reviewed nursing notes, Vitals, and Lab results. Since last encounter, pertinent lab results CBC and BMP and magnesium    . I have ordered test including CBC and BMP magnesium  .  Disposition: Status is: Inpatient Remains inpatient appropriate because: Monitor for stability of electrolytes currently awaiting SNF.  enoxaparin  (LOVENOX ) injection 40 mg Start: 03/12/24 1000   Family Communication: No one at bedside Level of care: Med-Surg   Vitals:   03/13/24 0335 03/13/24 0456 03/13/24 1116 03/13/24 1604  BP: (!) 111/58  115/79 108/71  Pulse: 85  79 83  Resp: 19  (!) 22 (!) 22  Temp: 98.8 F (37.1 C)  98.5 F (36.9 C) 99.5 F (37.5 C)  TempSrc: Oral  Oral Oral  SpO2: 93%  97% 97%  Weight:  68 kg    Height:         Author: Yetta Blanch, MD 03/13/2024 7:05 PM  Please look on [email or web address] to find out who is on call.

## 2024-03-14 DIAGNOSIS — E861 Hypovolemia: Secondary | ICD-10-CM | POA: Diagnosis not present

## 2024-03-14 LAB — COMPREHENSIVE METABOLIC PANEL WITH GFR
ALT: 224 U/L — ABNORMAL HIGH (ref 0–44)
AST: 50 U/L — ABNORMAL HIGH (ref 15–41)
Albumin: 2.4 g/dL — ABNORMAL LOW (ref 3.5–5.0)
Alkaline Phosphatase: 145 U/L — ABNORMAL HIGH (ref 38–126)
Anion gap: 9 (ref 5–15)
BUN: 7 mg/dL — ABNORMAL LOW (ref 8–23)
CO2: 18 mmol/L — ABNORMAL LOW (ref 22–32)
Calcium: 8.1 mg/dL — ABNORMAL LOW (ref 8.9–10.3)
Chloride: 111 mmol/L (ref 98–111)
Creatinine, Ser: 0.9 mg/dL (ref 0.61–1.24)
GFR, Estimated: >60 mL/min (ref >60)
Glucose, Bld: 95 mg/dL (ref 70–99)
Potassium: 3.5 mmol/L (ref 3.5–5.1)
Sodium: 138 mmol/L (ref 135–145)
Total Bilirubin: 0.4 mg/dL (ref 0.0–1.2)
Total Protein: 5.1 g/dL — ABNORMAL LOW (ref 6.5–8.1)

## 2024-03-14 LAB — CBC WITH DIFFERENTIAL/PLATELET
Abs Immature Granulocytes: 0.18 K/uL — ABNORMAL HIGH (ref 0.00–0.07)
Basophils Absolute: 0 K/uL (ref 0.0–0.1)
Basophils Relative: 1 %
Eosinophils Absolute: 0.1 K/uL (ref 0.0–0.5)
Eosinophils Relative: 1 %
HCT: 31.8 % — ABNORMAL LOW (ref 39.0–52.0)
Hemoglobin: 10.9 g/dL — ABNORMAL LOW (ref 13.0–17.0)
Immature Granulocytes: 2 %
Lymphocytes Relative: 27 %
Lymphs Abs: 2.3 K/uL (ref 0.7–4.0)
MCH: 29.7 pg (ref 26.0–34.0)
MCHC: 34.3 g/dL (ref 30.0–36.0)
MCV: 86.6 fL (ref 80.0–100.0)
Monocytes Absolute: 1.1 K/uL — ABNORMAL HIGH (ref 0.1–1.0)
Monocytes Relative: 12 %
Neutro Abs: 5 K/uL (ref 1.7–7.7)
Neutrophils Relative %: 57 %
Platelets: 291 K/uL (ref 150–400)
RBC: 3.67 MIL/uL — ABNORMAL LOW (ref 4.22–5.81)
RDW: 14.1 % (ref 11.5–15.5)
WBC: 8.7 K/uL (ref 4.0–10.5)
nRBC: 0 % (ref 0.0–0.2)

## 2024-03-14 LAB — PHOSPHORUS: Phosphorus: 4 mg/dL (ref 2.5–4.6)

## 2024-03-14 LAB — MAGNESIUM: Magnesium: 1.9 mg/dL (ref 1.7–2.4)

## 2024-03-14 MED ORDER — ATENOLOL 25 MG PO TABS
25.0000 mg | ORAL_TABLET | Freq: Every day | ORAL | Status: DC
Start: 2024-03-15 — End: 2024-03-14

## 2024-03-14 MED ORDER — CYCLOBENZAPRINE HCL 5 MG PO TABS
5.0000 mg | ORAL_TABLET | Freq: Three times a day (TID) | ORAL | Status: DC | PRN
  Administered 2024-03-14 – 2024-03-15 (×2): 5 mg via ORAL
  Filled 2024-03-14 (×2): qty 1

## 2024-03-14 MED ORDER — LACTATED RINGERS IV BOLUS
1000.0000 mL | Freq: Once | INTRAVENOUS | Status: AC
  Administered 2024-03-14: 1000 mL via INTRAVENOUS

## 2024-03-14 MED ORDER — ATENOLOL 25 MG PO TABS
25.0000 mg | ORAL_TABLET | Freq: Every day | ORAL | Status: DC
  Administered 2024-03-14: 25 mg via ORAL
  Filled 2024-03-14 (×2): qty 1

## 2024-03-14 NOTE — Plan of Care (Signed)
  Problem: Education: Goal: Knowledge of General Education information will improve Description: Including pain rating scale, medication(s)/side effects and non-pharmacologic comfort measures 03/14/2024 0016 by Marvis Kenneth SAILOR, RN Outcome: Progressing 03/13/2024 2009 by Marvis Kenneth SAILOR, RN Outcome: Progressing 03/13/2024 1940 by Marvis Kenneth SAILOR, RN Outcome: Progressing   Problem: Health Behavior/Discharge Planning: Goal: Ability to manage health-related needs will improve 03/14/2024 0016 by Marvis Kenneth SAILOR, RN Outcome: Progressing 03/13/2024 2009 by Marvis Kenneth SAILOR, RN Outcome: Progressing 03/13/2024 1940 by Marvis Kenneth SAILOR, RN Outcome: Progressing   Problem: Clinical Measurements: Goal: Ability to maintain clinical measurements within normal limits will improve 03/14/2024 0016 by Marvis Kenneth SAILOR, RN Outcome: Progressing 03/13/2024 2009 by Marvis Kenneth SAILOR, RN Outcome: Progressing 03/13/2024 1940 by Marvis Kenneth SAILOR, RN Outcome: Progressing Goal: Will remain free from infection 03/14/2024 0016 by Marvis Kenneth SAILOR, RN Outcome: Progressing 03/13/2024 2009 by Marvis Kenneth SAILOR, RN Outcome: Progressing 03/13/2024 1940 by Marvis Kenneth SAILOR, RN Outcome: Progressing Goal: Diagnostic test results will improve 03/14/2024 0016 by Marvis Kenneth SAILOR, RN Outcome: Progressing 03/13/2024 2009 by Marvis Kenneth SAILOR, RN Outcome: Progressing 03/13/2024 1940 by Marvis Kenneth SAILOR, RN Outcome: Progressing Goal: Respiratory complications will improve 03/14/2024 0016 by Marvis Kenneth SAILOR, RN Outcome: Progressing 03/13/2024 2009 by Marvis Kenneth SAILOR, RN Outcome: Progressing 03/13/2024 1940 by Marvis Kenneth SAILOR, RN Outcome: Progressing Goal: Cardiovascular complication will be avoided 03/14/2024 0016 by Marvis Kenneth SAILOR, RN Outcome: Progressing 03/13/2024 2009 by Marvis Kenneth SAILOR, RN Outcome: Progressing 03/13/2024 1940 by Marvis Kenneth SAILOR, RN Outcome: Progressing    Problem: Activity: Goal: Risk for activity intolerance will decrease 03/14/2024 0016 by Marvis Kenneth SAILOR, RN Outcome: Progressing 03/13/2024 2009 by Marvis Kenneth SAILOR, RN Outcome: Progressing 03/13/2024 1940 by Marvis Kenneth SAILOR, RN Outcome: Progressing   Problem: Nutrition: Goal: Adequate nutrition will be maintained 03/14/2024 0016 by Marvis Kenneth SAILOR, RN Outcome: Progressing 03/13/2024 2009 by Marvis Kenneth SAILOR, RN Outcome: Progressing 03/13/2024 1940 by Marvis Kenneth SAILOR, RN Outcome: Progressing   Problem: Coping: Goal: Level of anxiety will decrease 03/14/2024 0016 by Marvis Kenneth SAILOR, RN Outcome: Progressing 03/13/2024 2009 by Marvis Kenneth SAILOR, RN Outcome: Progressing 03/13/2024 1940 by Marvis Kenneth SAILOR, RN Outcome: Progressing   Problem: Elimination: Goal: Will not experience complications related to bowel motility 03/14/2024 0016 by Marvis Kenneth SAILOR, RN Outcome: Progressing 03/13/2024 2009 by Marvis Kenneth SAILOR, RN Outcome: Progressing 03/13/2024 1940 by Marvis Kenneth SAILOR, RN Outcome: Progressing Goal: Will not experience complications related to urinary retention 03/14/2024 0016 by Marvis Kenneth SAILOR, RN Outcome: Progressing 03/13/2024 2009 by Marvis Kenneth SAILOR, RN Outcome: Progressing 03/13/2024 1940 by Marvis Kenneth SAILOR, RN Outcome: Progressing   Problem: Pain Managment: Goal: General experience of comfort will improve and/or be controlled 03/14/2024 0016 by Marvis Kenneth SAILOR, RN Outcome: Progressing 03/13/2024 2009 by Marvis Kenneth SAILOR, RN Outcome: Progressing 03/13/2024 1940 by Marvis Kenneth SAILOR, RN Outcome: Progressing   Problem: Safety: Goal: Ability to remain free from injury will improve 03/14/2024 0016 by Marvis Kenneth SAILOR, RN Outcome: Progressing 03/13/2024 2009 by Marvis Kenneth SAILOR, RN Outcome: Progressing 03/13/2024 1940 by Marvis Kenneth SAILOR, RN Outcome: Progressing   Problem: Skin Integrity: Goal: Risk for impaired skin  integrity will decrease 03/14/2024 0016 by Marvis Kenneth SAILOR, RN Outcome: Progressing 03/13/2024 2009 by Marvis Kenneth SAILOR, RN Outcome: Progressing 03/13/2024 1940 by Marvis Kenneth SAILOR, RN Outcome: Progressing

## 2024-03-14 NOTE — Progress Notes (Signed)
 [facility] Progress Note Patient: Casey Warren FMW:[id number] DOB: 11/20/1960  DOA: 03/02/2024 DOS: the patient was seen and examined on 03/14/2024  Brief Hospital Course: Casey Warren is a 63 y.o. male with PMH of  hypertension, hyperlipidemia, chronic anxiety/depression, alcoholism, who presents to the ER, referred by his PCP due to low blood pressures of unclear etiology. Per family members at bedside he has cut down on alcohol consumption.  Last alcoholic drink was about a week ago. In the ER, hypotensive with SBP's in the 70's.  Creatinine elevated 4.14 above baseline.  Alert and oriented x 2.  Received 2L IVF with improvement of his BP.   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  No acute intracranial abnormality.He has had a decline in memory for years.  Assessment and plan: Acute encephalopathy/agitation/delirium Alcohol abuse, w/ withdrawals Recent cognitive decline last Etoh with wine bottle on 02/26/24 per daughter,normally heavy drinker-he had weaned himself from beer for a month. Psych consulted. Treated with CIWA protocol and high-dose thiamine . MRI brain shows acute infarct in right basal ganglia, left corona radiata as well as medial temporal lobe and left. PT OT recommending CIR, insurance denied CIR.  Awaiting SNF now.  Hyperthyroidism Possible hypothyroid crisis Relative adrenal insufficiency w/ low cortisol: TSH undetectable T39.8 Free T4 3.25. TSI 5.08.  Elevated. Thyrotropin receptor antibody 8.45 elevated. A.m. cortisol at 5 AM was low, but Cosyntropin  test reassuring  Prior provider, discussed with outpatient [name]  thryoid moderately heterogenous thyroid  gland- likely thyroiditis. Received PTU initially with and now on methimazole . Continue atenolol .  Also on Decadron  last dose of therapy 10/2.  Acute kidney  injury Metabolic acidosis Hypokalemia Hypophosphatemia. Hypomagnesemia. Hypernatremia. AKI in the setting of hypotension/poor oral intake.  Creatinine improved with IV hydration. Electrolyte replaced.  Hypotension History of hypertension-PTA on amlodipine /valsartan /HCTZ: Blood pressure now stable.  Continue atenolol .  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 Neurology was consulted. Currently on aspirin  and Plavix for 3 weeks followed by Plavix alone. Will require 30-day heart monitor after discharge. CIR following.   Chronic anxiety/depression Psychiatry was consulted.  Now signed off. Currently on Lexapro  and Haldol  as needed for agitation.  Lung nodules: 3 mm lingular, 5 mm right upper lobe Follow-up in 12 month w/ CT chest  Severe protein-calorie malnutrition. Refeeding syndrome. Continue supplementation. Had a Cortrak which is now removed.  LFT elevation. Improving after bolus. Bilirubin normal. Suspect chronic liver injury from alcohol use.  HLD. LDL 78. Was on statin. LFTs are elevated therefore not a candidate for high intensity statin. Currently holding statin.  Tension headache. Continue Topamax .  Steroid-induced leukocytosis. No evidence of clinical infection.  Monitor.  Non-anion gap metabolic acidosis. Likely from hyperchloremia.  As well as diarrhea. Patient remains asymptomatic.  Possible pneumonia. Treated with ceftriaxone azithromycin for 5 days. Monitor.  Antibiotic induced diarrhea. Antibiotic is now stopped.   Leukocytosis now resolved.  Subjective: Denies any rectal pain.  No nausea vomiting no fever no chills.  During the day or the patient had some back pain.  Which is also chronic.  Physical Exam: Clear to auscultation. Bowel sound present. No edema.  No focal deficit.  Data Reviewed: I have Reviewed nursing notes, Vitals, and Lab results. Since last encounter, pertinent lab  results CBC and BMP   . I have ordered test including CMP and magnesium  .  Disposition: Status is: Inpatient Remains inpatient appropriate because: Awaiting placement.  enoxaparin  (LOVENOX ) injection 40 mg Start: 03/12/24 1000   Family Communication: No one at bedside Level of care: Med-Surg   Vitals:   03/14/24 1616 03/14/24 1628 03/14/24 1630 03/14/24 1908  BP: 135/84 (!) 136/56  119/80  Pulse: 94 87  91  Resp: 20 (!) 27 20 20   Temp: 97.9 F (36.6 C) 99.6 F (37.6 C)  97.9 F (36.6 C)  TempSrc: Axillary Oral  Oral  SpO2: 97% 97%  95%  Weight:      Height:         Author: Yetta Blanch, MD 03/14/2024 7:59 PM  Please look on [email or web address] to find out who is on call.

## 2024-03-15 DIAGNOSIS — E861 Hypovolemia: Secondary | ICD-10-CM | POA: Diagnosis not present

## 2024-03-15 LAB — CBC WITH DIFFERENTIAL/PLATELET
Abs Immature Granulocytes: 0.11 K/uL — ABNORMAL HIGH (ref 0.00–0.07)
Basophils Absolute: 0 K/uL (ref 0.0–0.1)
Basophils Relative: 0 %
Eosinophils Absolute: 0.1 K/uL (ref 0.0–0.5)
Eosinophils Relative: 1 %
HCT: 29.9 % — ABNORMAL LOW (ref 39.0–52.0)
Hemoglobin: 10.1 g/dL — ABNORMAL LOW (ref 13.0–17.0)
Immature Granulocytes: 2 %
Lymphocytes Relative: 26 %
Lymphs Abs: 1.9 K/uL (ref 0.7–4.0)
MCH: 29.8 pg (ref 26.0–34.0)
MCHC: 33.8 g/dL (ref 30.0–36.0)
MCV: 88.2 fL (ref 80.0–100.0)
Monocytes Absolute: 0.9 K/uL (ref 0.1–1.0)
Monocytes Relative: 12 %
Neutro Abs: 4.5 K/uL (ref 1.7–7.7)
Neutrophils Relative %: 59 %
Platelets: 290 K/uL (ref 150–400)
RBC: 3.39 MIL/uL — ABNORMAL LOW (ref 4.22–5.81)
RDW: 14.7 % (ref 11.5–15.5)
WBC: 7.4 K/uL (ref 4.0–10.5)
nRBC: 0 % (ref 0.0–0.2)

## 2024-03-15 LAB — COMPREHENSIVE METABOLIC PANEL WITH GFR
ALT: 177 U/L — ABNORMAL HIGH (ref 0–44)
AST: 39 U/L (ref 15–41)
Albumin: 2.2 g/dL — ABNORMAL LOW (ref 3.5–5.0)
Alkaline Phosphatase: 133 U/L — ABNORMAL HIGH (ref 38–126)
Anion gap: 6 (ref 5–15)
BUN: 5 mg/dL — ABNORMAL LOW (ref 8–23)
CO2: 18 mmol/L — ABNORMAL LOW (ref 22–32)
Calcium: 8.2 mg/dL — ABNORMAL LOW (ref 8.9–10.3)
Chloride: 115 mmol/L — ABNORMAL HIGH (ref 98–111)
Creatinine, Ser: 0.9 mg/dL (ref 0.61–1.24)
GFR, Estimated: >60 mL/min (ref >60)
Glucose, Bld: 92 mg/dL (ref 70–99)
Potassium: 3.8 mmol/L (ref 3.5–5.1)
Sodium: 139 mmol/L (ref 135–145)
Total Bilirubin: 0.5 mg/dL (ref 0.0–1.2)
Total Protein: 5 g/dL — ABNORMAL LOW (ref 6.5–8.1)

## 2024-03-15 LAB — PHOSPHORUS: Phosphorus: 3.7 mg/dL (ref 2.5–4.6)

## 2024-03-15 LAB — MAGNESIUM: Magnesium: 1.5 mg/dL — ABNORMAL LOW (ref 1.7–2.4)

## 2024-03-15 MED ORDER — MAGNESIUM SULFATE 4 GM/100ML IV SOLN
4.0000 g | Freq: Once | INTRAVENOUS | Status: AC
  Administered 2024-03-15: 4 g via INTRAVENOUS
  Filled 2024-03-15 (×2): qty 100

## 2024-03-15 MED ORDER — LACTATED RINGERS IV BOLUS
1000.0000 mL | Freq: Once | INTRAVENOUS | Status: AC
  Administered 2024-03-15: 1000 mL via INTRAVENOUS

## 2024-03-15 MED ORDER — ATENOLOL 25 MG PO TABS
12.5000 mg | ORAL_TABLET | Freq: Every day | ORAL | Status: DC
  Administered 2024-03-15 – 2024-03-16 (×2): 12.5 mg via ORAL
  Filled 2024-03-15 (×2): qty 0.5

## 2024-03-15 MED ORDER — LACTATED RINGERS IV SOLN: INTRAVENOUS | Status: DC

## 2024-03-15 NOTE — Progress Notes (Signed)
 [facility] Progress Note Patient: Casey Warren Casey Warren DOB: 1960-07-25  DOA: 03/02/2024 DOS: the patient was seen and examined on 03/15/2024  Brief Hospital Course: Casey Warren is a 63 y.o. male with PMH of  hypertension, hyperlipidemia, chronic anxiety/depression, alcoholism, who presents to the ER, referred by his PCP due to low blood pressures of unclear etiology. Per family members at bedside he has cut down on alcohol consumption.  Last alcoholic drink was about a week ago. In the ER, hypotensive with SBP's in the 70's.  Creatinine elevated 4.14 above baseline.  Alert and oriented x 2.  Received 2L IVF with improvement of his BP.   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  No acute intracranial abnormality.He has had a decline in memory for years.  Assessment and plan: Acute encephalopathy/agitation/delirium Alcohol abuse, w/ withdrawals Recent cognitive decline last Etoh with wine bottle on 02/26/24 per daughter,normally heavy drinker-he had weaned himself from beer for a month. Psych consulted. Treated with CIWA protocol and high-dose thiamine . MRI brain shows acute infarct in right basal ganglia, left corona radiata as well as medial temporal lobe and left. PT OT recommending CIR, insurance denied CIR.  Awaiting SNF now.  Hyperthyroidism Possible hypothyroid crisis Relative adrenal insufficiency w/ low cortisol: TSH undetectable T39.8 Free T4 3.25. TSI 5.08.  Elevated. Thyrotropin receptor antibody 8.45 elevated. A.m. cortisol at 5 AM was low, but Cosyntropin  test reassuring  Prior provider, discussed with outpatient [facility] Dr Mercie US  thryoid moderately heterogenous thyroid  gland- likely thyroiditis. Received PTU initially with and now on methimazole . Continue atenolol .  Also on Decadron  last dose of therapy 10/2.  Acute kidney  injury Metabolic acidosis Hypokalemia Hypophosphatemia. Hypomagnesemia. Hypernatremia. AKI in the setting of hypotension/poor oral intake.  Creatinine improved with IV hydration. Electrolyte replaced.   Hypotension History of hypertension-PTA on amlodipine /valsartan /HCTZ: Blood pressure now stable.  Continue atenolol .  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 Neurology was consulted. Currently on aspirin  and Plavix for 3 weeks followed by Plavix alone. Will require 30-day heart monitor after discharge. CIR following.   Chronic anxiety/depression Psychiatry was consulted.  Now signed off. Currently on Lexapro  and Haldol  as needed for agitation.  Lung nodules: 3 mm lingular, 5 mm right upper lobe Follow-up in 12 month w/ CT chest  Severe protein-calorie malnutrition. Refeeding syndrome. Continue supplementation. Had a Cortrak which is now removed.  LFT elevation. Improving after bolus. Bilirubin normal. Suspect chronic liver injury from alcohol use. Appears that LFTs are responding to IV fluid.  Will continue IV fluid overnight and monitor response.  HLD. LDL 78. Was on statin. LFTs are elevated therefore not a candidate for high intensity statin. Currently holding statin.  Tension headache. Continue Topamax .  Steroid-induced leukocytosis. No evidence of clinical infection.  Monitor.  Non-anion gap metabolic acidosis. Likely from hyperchloremia.  As well as diarrhea. Patient remains asymptomatic.  Possible pneumonia. Treated with ceftriaxone azithromycin for 5 days. Monitor.  Antibiotic induced diarrhea. Antibiotic is now stopped.   Subjective: No nausea no vomiting no fever no chills.  Denies any other acute complaint.  Physical Exam: Clear to auscultation. S1-S2 present No asterixis.  No focal deficit.  Data Reviewed: I have Reviewed nursing notes, Vitals, and Lab results. Since last encounter, pertinent  lab results CBC and BMP   . I have ordered test including CBC and BMP  .  Disposition: Status is: Inpatient Remains inpatient appropriate because: Awaiting placement  enoxaparin  (LOVENOX ) injection 40 mg Start: 03/12/24 1000   Family Communication: No one at bedside Level of care: Med-Surg   Vitals:   03/15/24 0837 03/15/24 1225 03/15/24 1600 03/15/24 1615  BP: 114/66 123/76 129/84 129/84  Pulse: 88 90  99  Resp: 18 16  16   Temp: 98.3 F (36.8 C) 98.7 F (37.1 C)  98.7 F (37.1 C)  TempSrc: Oral Oral  Oral  SpO2: 96% 97%  96%  Weight:      Height:         Author: Yetta Blanch, MD 03/15/2024 8:15 PM  Please look on [email or web address] to find out who is on call.

## 2024-03-15 NOTE — Plan of Care (Signed)

## 2024-03-15 NOTE — TOC Progression Note (Signed)
 Transition of Care [facility]) - Progression Note    Patient Details  Name: LAMARKUS NEBEL MRN: [id number] Date of Birth: 1960-11-01  Transition [facility]) CM/SW Contact  Montie LOISE Louder, [place] Phone Number: 03/15/2024, 10:43 AM  Clinical Narrative:     Patient has no bed offers  TOC will continue to follow and assist with discharge planning.   Expected Discharge Plan: Skilled Nursing Facility Barriers to Discharge: Continued Medical Work up, SNF Pending bed offer               Expected Discharge Plan and Services In-house Referral: Clinical Social Work     Living arrangements for the past 2 months: Single Family Home                                       Social Drivers of Health (SDOH) Interventions SDOH Screenings   Food Insecurity: No Food Insecurity (03/03/2024)  Housing: Low Risk  (03/03/2024)  Transportation Needs: No Transportation Needs (03/03/2024)  Utilities: Not At Risk (03/03/2024)  Depression (PHQ2-9): Low Risk  (07/29/2023)  Social Connections: Moderately Integrated (03/03/2024)  Tobacco Use: High Risk (03/02/2024)    Readmission Risk Interventions     No data to display

## 2024-03-15 NOTE — Progress Notes (Signed)
 Physical Therapy Treatment Patient Details Name: Casey Warren MRN: [id number] DOB: 1960/12/09 Today's Date: 03/15/2024   History of Present Illness Pt is 63 yo with hypotension referred from PCP on 9/23. 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 MRI 9/28 shows Acute infarcts with restricted diffusion in the R BG, L corona radiata, and L medial temporal lobe. PMH significant of seizures, PUD, GERD, hypertension, anemia, alcohol use, anxiety.    PT Comments  Pt progressing with gait well, ambulatory for 500 ft with RW and supervision for safety. Pt needing some cues for directions and way-finding back to room, but unsure of pt baseline in respect to this. Pt able to navigate x6 stairs, demonstrating ability to enter home, but would benefit from installation of handrail with stairs and/or have HHA from family for stairs at this time. Pt eager to d/c home, recommendations updated accordingly.      If plan is discharge home, recommend the following: Assist for transportation;Help with stairs or ramp for entrance;Assistance with cooking/housework;A little help with walking and/or transfers;A little help with bathing/dressing/bathroom;Supervision due to cognitive status   Can travel by private vehicle        Equipment Recommendations  Rolling walker (2 wheels);BSC/3in1    Recommendations for Other Services       Precautions / Restrictions Precautions Precautions: Fall Recall of Precautions/Restrictions: Impaired Restrictions Weight Bearing Restrictions Per Provider Order: No     Mobility  Bed Mobility Overal bed mobility: Needs Assistance Bed Mobility: Supine to Sit, Sit to Supine     Supine to sit: Supervision Sit to supine: Supervision   General bed mobility comments: safety, no physical assist    Transfers Overall transfer level: Needs  assistance Equipment used: Rolling walker (2 wheels) Transfers: Sit to/from Stand Sit to Stand: Supervision           General transfer comment: safety    Ambulation/Gait Ambulation/Gait assistance: Contact guard assist, Supervision Gait Distance (Feet): 500 Feet Assistive device: Rolling walker (2 wheels) Gait Pattern/deviations: Decreased stride length, Trunk flexed, Narrow base of support, Step-through pattern Gait velocity: decr     General Gait Details: supervision for majority of gait for safety, verbal cuing for proximity to RW, upright posture, and hallway navigation. pt unable to find way back to room   Stairs Stairs: Yes Stairs assistance: Contact guard assist, Min assist Stair Management: No rails, Step to pattern, Forwards Number of Stairs: 6 General stair comments: assist to steady and provide HHA on ascent; pt requiring use of R rail and HHA for descent given poor eccentric control.   Wheelchair Mobility     Tilt Bed    Modified Rankin (Stroke Patients Only) Modified Rankin (Stroke Patients Only) Pre-Morbid Rankin Score: No symptoms Modified Rankin: Moderately severe disability     Balance Overall balance assessment: Needs assistance Sitting-balance support: No upper extremity supported, Feet supported Sitting balance-Leahy Scale: Good     Standing balance support: During functional activity, Bilateral upper extremity supported Standing balance-Leahy Scale: Fair                              Hotel manager: No apparent difficulties  Cognition Arousal: Alert Behavior During Therapy: WFL for tasks assessed/performed   PT - Cognitive impairments: Safety/Judgement, Sequencing, Problem solving  PT - Cognition Comments: impaired spatial and safety awareness Following commands: Impaired Following commands impaired: Follows one step commands with increased time    Cueing Cueing  Techniques: Verbal cues  Exercises      General Comments General comments (skin integrity, edema, etc.): VSS on RA      Pertinent Vitals/Pain Pain Assessment Pain Assessment: No/denies pain Faces Pain Scale: No hurt Pain Intervention(s): Limited activity within patient's tolerance, Monitored during session    Home Living                          Prior Function            PT Goals (current goals can now be found in the care plan section) Acute Rehab PT Goals Patient Stated Goal: to go home PT Goal Formulation: With patient Time For Goal Achievement: 03/17/24 Potential to Achieve Goals: Good Progress towards PT goals: Progressing toward goals    Frequency    Min 2X/week      PT Plan      Co-evaluation              AM-PAC PT 6 Clicks Mobility   Outcome Measure  Help needed turning from your back to your side while in a flat bed without using bedrails?: A Little Help needed moving from lying on your back to sitting on the side of a flat bed without using bedrails?: A Little Help needed moving to and from a bed to a chair (including a wheelchair)?: A Little Help needed standing up from a chair using your arms (e.g., wheelchair or bedside chair)?: A Little Help needed to walk in hospital room?: A Little Help needed climbing 3-5 steps with a railing? : A Little 6 Click Score: 18    End of Session   Activity Tolerance: Patient tolerated treatment well Patient left: with call bell/phone within reach;in bed;with bed alarm set Nurse Communication: Mobility status PT Visit Diagnosis: Unsteadiness on feet (R26.81);Other abnormalities of gait and mobility (R26.89);Muscle weakness (generalized) (M62.81);Difficulty in walking, not elsewhere classified (R26.2)     Time: 8889-8865 PT Time Calculation (min) (ACUTE ONLY): 24 min  Charges:    $Gait Training: 23-37 mins PT General Charges $$ ACUTE PT VISIT: 1 Visit                     Johana RAMAN, PT  DPT Acute Rehabilitation Services Secure Chat Preferred  Office [phone]    Jessalyn Hinojosa E Johna 03/15/2024, 11:57 AM

## 2024-03-15 NOTE — Progress Notes (Signed)
 Occupational Therapy Treatment Patient Details Name: Casey Warren MRN: [id number] DOB: Oct 23, 1960 Today's Date: 03/15/2024   History of present illness Pt is 63 yo with hypotension referred from PCP on 9/23. 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 MRI 9/28 shows Acute infarcts with restricted diffusion in the R BG, L corona radiata, and L medial temporal lobe. PMH significant of seizures, PUD, GERD, hypertension, anemia, alcohol use, anxiety.   OT comments  Pt is making great progress towards their acute OT goals. Overall he did not need physical assist for mobility or ADLs, he was CGA-supervision for transfers, hallway mobility with RW and demonstrated good ability to reach bilateral feet for LB ADLs. Pt verbalized good recall of safety with bathroom transfers and AD use in the home. OT to continue to follow acutely to facilitate progress towards established goals. Pt will benefit from HHOT.       If plan is discharge home, recommend the following:  A little help with walking and/or transfers;A little help with bathing/dressing/bathroom;Assistance with cooking/housework;Direct supervision/assist for medications management;Direct supervision/assist for financial management;Assist for transportation;Supervision due to cognitive status   Equipment Recommendations  Other (comment);Tub/shower seat (RW)       Precautions / Restrictions Precautions Precautions: Fall Recall of Precautions/Restrictions: Impaired Restrictions Weight Bearing Restrictions Per Provider Order: No       Mobility Bed Mobility Overal bed mobility: Needs Assistance Bed Mobility: Supine to Sit, Sit to Supine     Supine to sit: Supervision Sit to supine: Supervision        Transfers Overall transfer level: Needs assistance Equipment used: Rolling walker (2 wheels),  None Transfers: Sit to/from Stand Sit to Stand: Supervision           General transfer comment: supervision - CGA for all aspects of mobility throughout. No overt safety awareness issues or LOB.     Balance Overall balance assessment: Needs assistance Sitting-balance support: No upper extremity supported, Feet supported Sitting balance-Leahy Scale: Good     Standing balance support: Single extremity supported, During functional activity Standing balance-Leahy Scale: Fair Standing balance comment: statically                           ADL either performed or assessed with clinical judgement   ADL Overall ADL's : Needs assistance/impaired     Grooming: Supervision/safety;Bed level                   Toilet Transfer: Contact guard assist;Ambulation;Rolling walker (2 wheels)           Functional mobility during ADLs: Contact guard assist;Rolling walker (2 wheels);Cueing for safety General ADL Comments: demonstrated great progress, CGA-supervision A needed throughout. dual tasking in the hall way with way finding tasks and education with immediate recall. no overt LOB or safety concerns noted during funcitonal ambulation. Pt with improved insight to safety    Extremity/Trunk Assessment Upper Extremity Assessment Upper Extremity Assessment: RUE deficits/detail;LUE deficits/detail RUE Deficits / Details: ROM is WFL, strength is grossly 4/5. LUE Deficits / Details: ROM is WFL, strength is grossly 4/5. some residual shoulder pain, pt reports this is chronic   Lower Extremity Assessment Lower Extremity Assessment: Defer to PT evaluation        Vision   Vision Assessment?: No apparent visual deficits   Perception Perception Perception: Within Functional Limits  Praxis Praxis Praxis: Not tested   Communication Communication Communication: No apparent difficulties   Cognition Arousal: Alert Behavior During Therapy: WFL for tasks  assessed/performed Cognition: Cognition impaired     Awareness: Online awareness impaired Memory impairment (select all impairments): Non-declarative long-term memory, Working memory Attention impairment (select first level of impairment): Divided attention Executive functioning impairment (select all impairments): Reasoning, Problem solving OT - Cognition Comments: cognition improving, unsure of baseline. pt able to way-find back to his room. good awareness of shower safety.                 Following commands: Impaired Following commands impaired: Only follows one step commands consistently, Follows multi-step commands inconsistently      Cueing   Cueing Techniques: Verbal cues  Exercises      Shoulder Instructions       General Comments VSS on RA    Pertinent Vitals/ Pain       Pain Assessment Pain Assessment: No/denies pain Pain Intervention(s): Monitored during session  Home Living                                          Prior Functioning/Environment              Frequency  Min 2X/week        Progress Toward Goals  OT Goals(current goals can now be found in the care plan section)  Progress towards OT goals: Progressing toward goals  Acute Rehab OT Goals Patient Stated Goal: to go home OT Goal Formulation: With patient Time For Goal Achievement: 03/17/24 Potential to Achieve Goals: Good ADL Goals Pt Will Perform Upper Body Dressing: with set-up;with supervision Pt Will Perform Lower Body Dressing: with set-up;with supervision Pt Will Transfer to Toilet: with set-up;with supervision Pt Will Perform Toileting - Clothing Manipulation and hygiene: with set-up;with supervision  Plan      AM-PAC OT 6 Clicks Daily Activity     Outcome Measure   Help from another person eating meals?: None Help from another person taking care of personal grooming?: A Little Help from another person toileting, which includes using toliet,  bedpan, or urinal?: A Little Help from another person bathing (including washing, rinsing, drying)?: A Little Help from another person to put on and taking off regular upper body clothing?: A Little Help from another person to put on and taking off regular lower body clothing?: A Little 6 Click Score: 19    End of Session Equipment Utilized During Treatment: Gait belt;Rolling walker (2 wheels)  OT Visit Diagnosis: Unsteadiness on feet (R26.81);Other abnormalities of gait and mobility (R26.89);Muscle weakness (generalized) (M62.81);Other symptoms and signs involving cognitive function   Activity Tolerance Patient tolerated treatment well   Patient Left in chair;with call bell/phone within reach;with chair alarm set   Nurse Communication Mobility status        Time: 9049-8988 OT Time Calculation (min): 21 min  Charges: OT General Charges $OT Visit: 1 Visit OT Treatments $Therapeutic Activity: 8-22 mins  Lucie Kendall, OTR/L Acute Rehabilitation Services Office [phone] Secure Chat Communication Preferred   Lucie JONETTA Kendall 03/15/2024, 10:14 AM

## 2024-03-15 NOTE — Plan of Care (Signed)

## 2024-03-16 DIAGNOSIS — I959 Hypotension, unspecified: Secondary | ICD-10-CM | POA: Diagnosis not present

## 2024-03-16 LAB — MAGNESIUM: Magnesium: 1.7 mg/dL (ref 1.7–2.4)

## 2024-03-16 LAB — COMPREHENSIVE METABOLIC PANEL WITH GFR
ALT: 149 U/L — ABNORMAL HIGH (ref 0–44)
AST: 36 U/L (ref 15–41)
Albumin: 2.3 g/dL — ABNORMAL LOW (ref 3.5–5.0)
Alkaline Phosphatase: 145 U/L — ABNORMAL HIGH (ref 38–126)
Anion gap: 9 (ref 5–15)
BUN: 5 mg/dL — ABNORMAL LOW (ref 8–23)
CO2: 19 mmol/L — ABNORMAL LOW (ref 22–32)
Calcium: 8.4 mg/dL — ABNORMAL LOW (ref 8.9–10.3)
Chloride: 112 mmol/L — ABNORMAL HIGH (ref 98–111)
Creatinine, Ser: 0.99 mg/dL (ref 0.61–1.24)
GFR, Estimated: >60 mL/min (ref >60)
Glucose, Bld: 126 mg/dL — ABNORMAL HIGH (ref 70–99)
Potassium: 3.8 mmol/L (ref 3.5–5.1)
Sodium: 140 mmol/L (ref 135–145)
Total Bilirubin: 0.5 mg/dL (ref 0.0–1.2)
Total Protein: 5.4 g/dL — ABNORMAL LOW (ref 6.5–8.1)

## 2024-03-16 MED ORDER — SODIUM BICARBONATE 650 MG PO TABS: 650.0000 mg | ORAL_TABLET | Freq: Two times a day (BID) | ORAL | 0 refills | Status: AC

## 2024-03-16 MED ORDER — CYCLOBENZAPRINE HCL 5 MG PO TABS: 5.0000 mg | ORAL_TABLET | Freq: Three times a day (TID) | ORAL | 0 refills | Status: AC | PRN

## 2024-03-16 MED ORDER — VITAMIN B-1 100 MG PO TABS: 200.0000 mg | ORAL_TABLET | Freq: Every day | ORAL | 0 refills | Status: AC

## 2024-03-16 MED ORDER — NICOTINE 21 MG/24HR TD PT24: 21.0000 mg | MEDICATED_PATCH | Freq: Every day | TRANSDERMAL | 0 refills | Status: AC

## 2024-03-16 MED ORDER — FOLIC ACID 1 MG PO TABS: 1.0000 mg | ORAL_TABLET | Freq: Every day | ORAL | 0 refills | Status: AC

## 2024-03-16 MED ORDER — ATENOLOL 25 MG PO TABS: 12.5000 mg | ORAL_TABLET | Freq: Every day | ORAL | 0 refills | Status: DC

## 2024-03-16 MED ORDER — ADULT MULTIVITAMIN W/MINERALS CH: 1.0000 | ORAL_TABLET | Freq: Every day | ORAL | 0 refills | Status: AC

## 2024-03-16 MED ORDER — LOPERAMIDE HCL 2 MG PO CAPS: 2.0000 mg | ORAL_CAPSULE | ORAL | 0 refills | Status: AC | PRN

## 2024-03-16 MED ORDER — CLOPIDOGREL BISULFATE 75 MG PO TABS: 75.0000 mg | ORAL_TABLET | Freq: Every day | ORAL | 0 refills | Status: AC

## 2024-03-16 MED ORDER — METHIMAZOLE 10 MG PO TABS: 20.0000 mg | ORAL_TABLET | Freq: Every day | ORAL | 0 refills | Status: DC

## 2024-03-16 MED ORDER — ENSURE PLUS HIGH PROTEIN PO LIQD: 237.0000 mL | Freq: Two times a day (BID) | ORAL | 0 refills | Status: AC

## 2024-03-16 MED ORDER — MAGNESIUM OXIDE 400 MG PO TABS: 400.0000 mg | ORAL_TABLET | Freq: Two times a day (BID) | ORAL | 0 refills | Status: AC

## 2024-03-16 NOTE — TOC Transition Note (Signed)
 Transition of Care (TOC) - Discharge Note Casey Gobble RN,BSN Inpatient Care Management Unit 4NP (Non Trauma)- RN Case Manager See Treatment Team for direct Phone #   Patient Details  Name: Casey Warren MRN: [id number] Date of Birth: [birth year]  Transition of Care [facility]) CM/SW Contact:  Casey Casey Hurst, RN Phone Number: 03/16/2024, 12:17 PM   Clinical Narrative:    Pt stable for transition home today, recommendations have been updated to home w/ HH. MD to place orders for [facility] and DME needs.   CM in to speak with pt at bedside. Pt agreeable to [facility], and confirmed DME needs.  List provided for [facility] choice Per CMS guidelines from [email or web address] website with star ratings (copy placed in shadow chart)- pt voiced HH has come out to home in past but not sure which one- request CM to call his wife.   Pt has no proference in DME provider as long as they are in-network with insurance- [employer] is in-network- pt agreeable.   Daughter to transport home.   TC made to spouse- discussed plan to return home w/ [facility]- wife voiced understanding- with regards [facility]- she does not remember agency either- voiced is was several years ago- states they do not have a preference.   Referral for DME needs sent to [employer] via epic Hub- referral has been accepted and liaison to have DME delivered prior to discharge.   Referral for [facility] called to liaison- referral has been accepted for HHPT/OT/SW.   1200- received msg from unit RN that daughter has questions about why pt is not going to SNF- call made to daughter to address questions and concerns. Explained updated therapy recommendations and that insurance would not likely cover SNF stay at this time. Pt is medically stable to return home w/ HH. Daughter voiced she would come pick pt up.   IP CM interventions have been completed no further needs noted.   Final next level of care: Home w Home Health Services Barriers to Discharge: Barriers  Resolved   Patient Goals and CMS Choice Patient states their goals for this hospitalization and ongoing recovery are:: return home CMS Medicare.gov Compare Post Acute Care list provided to:: Patient Choice offered to / list presented to : Patient, Spouse      Discharge Placement               Home w/ [facility]        Discharge Plan and Services Additional resources added to the After Visit Summary for   In-house Referral: Clinical Social Work Discharge Planning Services: CM Consult Post Acute Care Choice: Home Health, Durable Medical Equipment          DME Arranged: Bedside commode, Shower stool, Walker rolling DME Agency: [employer] Date DME Agency Contacted: 03/16/24 Time DME Agency Contacted: 1140 Representative spoke with at DME Agency: [place] HH Arranged: PT, OT, Social Work [employer] Agency: [facility] Date [facility] Agency Contacted: 03/16/24 Time HH Agency Contacted: 1140 Representative spoke with at [facility] Agency: Amy  Social Drivers of Health (SDOH) Interventions SDOH Screenings   Food Insecurity: No Food Insecurity (03/03/2024)  Housing: Low Risk  (03/03/2024)  Transportation Needs: No Transportation Needs (03/03/2024)  Utilities: Not At Risk (03/03/2024)  Depression (PHQ2-9): Low Risk  (07/29/2023)  Social Connections: Moderately Integrated (03/03/2024)  Tobacco Use: High Risk (03/02/2024)     Readmission Risk Interventions    03/16/2024   12:17 PM  Readmission Risk Prevention Plan  Transportation Screening Complete  Medication Review (  RN Care Manager) Complete  HRI or Home Care Consult Complete  SW Recovery Care/Counseling Consult Complete  Palliative Care Screening Not Applicable  Skilled Nursing Facility Not Applicable

## 2024-03-16 NOTE — Progress Notes (Signed)
 Order to discharge patient home. Family at bedside. Discharge instructions/AVS given to and reviewed with patient and patient daughter. Education provided as needed . Patient verbalized understanding. 1 PIV removed by the nurse tech Renee.DME rolling walker and bedside commode 3 in 1 delivered to patient prior to discharge. Personal belongings sent home with the patient. Home via private vehicle.

## 2024-03-16 NOTE — Plan of Care (Signed)

## 2024-03-16 NOTE — Progress Notes (Signed)
 Nutrition Follow-up  DOCUMENTATION CODES:   Severe malnutrition in context of chronic illness  INTERVENTION:  Encourage PO intake - On dysphagia 3 diet, thin liquids per speech Room service with assist, pt cannot read menu Ensure Plus High Protein po BID, each supplement provides 350 kcal and 20 grams of protein Mighty Shake TID with meals, each supplement provides 330 kcals and 9 grams of protein Continue MVI, Thiamine , Folic acid  supplementation    *Per daughter cannot have citrus foods, banana, watermelon. Can have berries, pineapple, apples  NUTRITION DIAGNOSIS:   Severe Malnutrition related to chronic illness (ETOH abuse/withdrawl) as evidenced by energy intake < or equal to 75% for > or equal to 1 month, moderate fat depletion, moderate muscle depletion (a 17 lb, 10.6% wt loss in 6 months.). - Ongoing   GOAL:   Patient will meet greater than or equal to 90% of their needs - Progressing   MONITOR:   PO intake, Diet advancement, TF tolerance, Weight trends, Supplement acceptance  REASON FOR ASSESSMENT:   Consult Assessment of nutrition requirement/status  ASSESSMENT:   63 y.o. male with PMH of HTN, HLD,  hx of CVA, chronic anxiety/depression, ETOH abuse (last drink 1 week PTA), admitted for hypotension of unclear etiology.   9/26 - Cortrak placed, tube feeds started  9/28 - MRI brain, showed acute infarcts in the right basal ganglia, left corona radiator and left medial temporal lobe. NPO. 9/29 Nocturnal tube feeds, calorie count 9/30 - DC tube feeds removed NGT, meeting 76% kcal and 83% protein needs, diet downgraded to DYS2 by speech  10/3 - Dysphagia 3, thin liquids    Pt sleeping on visit, breakfast tray not touched but pt did say he would eat when he gets up. Pt continues to increase his po intake, eating 75-100% of his meals. Per RN eating well and drinking Ensures. Encourage PO intake. Medically stable for discharge, waiting for bed availability at  SNF.  Disposition: Awaiting SNF  Admit weight: 68 kg Current weight: 71.1 kg   Average Meal Intake: 9/29-10/6: 60% intake x 6 recorded meals  Nutritionally Relevant Medications: Scheduled Meds:  feeding supplement  237 mL Oral BID BM   folic acid   1 mg Oral Daily   magnesium chloride  1 tablet Oral BID   multivitamin with minerals  1 tablet Oral Daily   nicotine  21 mg Transdermal Daily   thiamine   200 mg Oral Daily   topiramate   100 mg Oral BID   Continuous Infusions:  lactated ringers  100 mL/hr at 03/15/24 2353   Labs Reviewed: BUN 5 Magnesium 1.5 AP 133 ALT 177 CBG ranges from 93-95 mg/dL over the last 24 hours HgbA1c 6.8  Diet Order:   Diet Order             DIET DYS 3 Room service appropriate? Yes with Assist; Fluid consistency: Thin  Diet effective now                   EDUCATION NEEDS:   Not appropriate for education at this time  Skin:  Skin Assessment: Reviewed RN Assessment  Last BM:  10/6, type 6 x 1  Height:   Ht Readings from Last 1 Encounters:  03/02/24 5' 4 (1.626 m)    Weight:   Wt Readings from Last 1 Encounters:  03/16/24 71.1 kg    Ideal Body Weight:  59.1 kg  BMI:  Body mass index is 26.91 kg/m.  Estimated Nutritional Needs:   Kcal:  1800-2100  kcal  Protein:  90-110 gm  Fluid:  >1.8L/day   Olivia Kenning, RD Registered Dietitian  See Amion for more information

## 2024-03-17 ENCOUNTER — Other Ambulatory Visit (INDEPENDENT_AMBULATORY_CARE_PROVIDER_SITE_OTHER): Payer: Self-pay | Admitting: *Deleted

## 2024-03-17 ENCOUNTER — Telehealth: Payer: Self-pay

## 2024-03-17 ENCOUNTER — Other Ambulatory Visit: Payer: Self-pay

## 2024-03-17 DIAGNOSIS — F418 Other specified anxiety disorders: Secondary | ICD-10-CM

## 2024-03-17 DIAGNOSIS — I639 Cerebral infarction, unspecified: Secondary | ICD-10-CM

## 2024-03-17 DIAGNOSIS — Z760 Encounter for issue of repeat prescription: Secondary | ICD-10-CM

## 2024-03-17 MED ORDER — ESCITALOPRAM OXALATE 20 MG PO TABS: 20.0000 mg | ORAL_TABLET | Freq: Every day | ORAL | 0 refills | Status: AC

## 2024-03-17 NOTE — Discharge Summary (Signed)
 Physician Discharge Summary   Patient: Casey Warren MRN: [id number] DOB: [birth year]  Admit date:     03/02/2024  Discharge date: 03/16/2024  Discharge Physician: Yetta Blanch  PCP: Rosalea Rosina SAILOR, PA  Recommendations at discharge:  Follow up with PCP in 1 week Follow up with PCP as recommended  Repeat LFT in week Establish care with endocrine   Follow-up Information     Gregg Lek, MD. Schedule an appointment as soon as possible for a visit in 1 month(s).   Specialty: Neurology Contact information: [address] [place] [address] [phone]         Celestia Rosaline SQUIBB, NP. Schedule an appointment as soon as possible for a visit in 1 week(s).   Specialty: Internal Medicine Why: recheck liver function test, magnesium levels in 1 week. Contact information: [address] [place] [address] [phone]         Thapa, [place], MD. Schedule an appointment as soon as possible for a visit.   Specialty: Endocrinology Why: To Establish Care Contact information: [address] [place] [address] [phone]         [facility] Follow up.   Why: rolling walker, bedside commode, shower chair arranged- to be delivered to pt prior to discharge Contact information: [address]  [address] [place] [address] [phone]        [facility] Follow up.   Why: Jamee)- HHPT/OT/social work arranged- they will contact you to schedule Contact information: [address] [place] [address] [phone]                 Hospital Course: Casey Warren is a 63 y.o. male with PMH of  hypertension, hyperlipidemia, chronic anxiety/depression, alcoholism, who presents to the ER, referred by his PCP due to low blood pressures of unclear etiology. Per family members at bedside he has cut down on alcohol consumption.  Last alcoholic drink was about a week ago. In the ER, hypotensive with SBP's in  the 70's.  Creatinine elevated 4.14 above baseline.  Alert and oriented x 2.  Received 2L IVF with improvement of his BP.   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  No acute intracranial abnormality.He has had a decline in memory for years.  Assessment and plan: Acute encephalopathy/agitation/delirium Alcohol abuse, w/ withdrawals Recent cognitive decline last Etoh with wine bottle on 02/26/24 per daughter,normally heavy drinker-he had weaned himself from beer for a month. Psych consulted. Treated with CIWA protocol and high-dose thiamine . MRI brain shows acute infarct in right basal ganglia, left corona radiata as well as medial temporal lobe and left. PT OT recommending CIR, insurance denied CIR.  SNF was recommended but pt progressed to remain eligible got HHPT.   Hyperthyroidism Possible hypothyroid crisis Relative adrenal insufficiency w/ low cortisol: TSH undetectable T39.8 Free T4 3.25. TSI 5.08.  Elevated. Thyrotropin receptor antibody 8.45 elevated. A.m. cortisol at 5 AM was low, but Cosyntropin  test reassuring  Prior provider, discussed with outpatient [facility] Dr Mercie US  thryoid moderately heterogenous thyroid  gland- likely thyroiditis. Received PTU initially with and now on methimazole . Continue atenolol .  Also on Decadron  last dose of therapy 10/2.  Acute kidney injury Metabolic acidosis Hypokalemia Hypophosphatemia. Hypomagnesemia. Hypernatremia. AKI in the setting of hypotension/poor oral intake.  Creatinine improved with IV hydration. Electrolyte replaced.   Hypotension History  of hypertension-PTA on amlodipine /valsartan /HCTZ: Blood pressure now stable.  Continue atenolol .  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 Neurology was consulted. Currently on aspirin  and Plavix for 3 weeks  followed by Plavix alone. Will require 30-day heart monitor after discharge.   Chronic anxiety/depression Psychiatry was consulted.  Now signed off. Currently on Lexapro . No agitation seen.  Lung nodules: 3 mm lingular, 5 mm right upper lobe Follow-up in 12 month w/ CT chest  Severe protein-calorie malnutrition. Refeeding syndrome. Continue supplementation. Had a Cortrak which is now removed.  LFT elevation. Improving after bolus. Bilirubin normal. Suspect chronic liver injury from alcohol use. Appears that LFTs are responding to IV fluid.  Will continue IV fluid overnight and monitor response.  HLD. LDL 78. Was on statin. LFTs are elevated therefore not a candidate for high intensity statin. Currently on statin.  Tension headache. Continue Topamax .  Steroid-induced leukocytosis. No evidence of clinical infection.  Monitor.  Non-anion gap metabolic acidosis. Likely from hyperchloremia.  As well as diarrhea. Patient remains asymptomatic.  Possible pneumonia. Treated with ceftriaxone azithromycin for 5 days. Monitor.  Antibiotic induced diarrhea. Antibiotic is now stopped.  Consultants:  Neurology  CIR Psychiatry  PCCM   Procedures performed:  Echocardiogram  TEE  DISCHARGE MEDICATION: Allergies as of 03/16/2024       Reactions   Banana Anaphylaxis   Per family   Citrus Anaphylaxis   Per daughter only allergic to citrus but can have apples, pineapple, etc   Chlorthalidone  Other (See Comments)   Hyponatremia        Medication List     STOP taking these medications    amLODipine  10 MG tablet Commonly known as: NORVASC    hydrochlorothiazide  12.5 MG tablet Commonly known as: HYDRODIURIL    valsartan  40 MG tablet Commonly known as: DIOVAN        TAKE these medications    aspirin  EC 81 MG tablet Take 81 mg by mouth daily.   atenolol  25 MG tablet Commonly known as: TENORMIN  Take 0.5 tablets (12.5 mg total) by mouth daily.    atorvastatin  20 MG tablet Commonly known as: LIPITOR TAKE 1 TABLET BY MOUTH EVERY DAY   clopidogrel 75 MG tablet Commonly known as: PLAVIX Take 1 tablet (75 mg total) by mouth daily.   cyclobenzaprine  5 MG tablet Commonly known as: FLEXERIL  Take 1 tablet (5 mg total) by mouth 3 (three) times daily as needed for muscle spasms. What changed:  medication strength how much to take   EPINEPHrine  0.3 mg/0.3 mL Soaj injection Commonly known as: EPI-PEN Inject 0.3 mg into the muscle as needed for anaphylaxis.   feeding supplement Liqd Take 237 mLs by mouth 2 (two) times daily between meals.   folic acid  1 MG tablet Commonly known as: FOLVITE  Take 1 tablet (1 mg total) by mouth daily.   loperamide 2 MG capsule Commonly known as: IMODIUM Take 1 capsule (2 mg total) by mouth as needed for diarrhea or loose stools.   loratadine  10 MG tablet Commonly known as: CLARITIN  TAKE 1 TABLET BY MOUTH EVERY DAY AS NEEDED FOR ALLERGY   magnesium oxide 400 MG tablet Commonly known as: MAG-OX Take 1 tablet (400 mg total) by mouth 2 (two) times daily.   methimazole  10 MG tablet Commonly known as: TAPAZOLE  Take 2 tablets (20 mg total) by mouth daily.   multivitamin with minerals Tabs tablet Take 1 tablet by mouth daily.   nicotine 21 mg/24hr patch Commonly known as: NICODERM CQ -  dosed in mg/24 hours Place 1 patch (21 mg total) onto the skin daily.   sodium bicarbonate 650 MG tablet Take 1 tablet (650 mg total) by mouth 2 (two) times daily.   thiamine  100 MG tablet Commonly known as: Vitamin B-1 Take 2 tablets (200 mg total) by mouth daily.   topiramate  100 MG tablet Commonly known as: TOPAMAX  Take 100 mg by mouth 2 (two) times daily.   Ventolin  HFA 108 (90 Base) MCG/ACT inhaler Generic drug: albuterol  Inhale 2 puffs into the lungs every 6 (six) hours as needed for wheezing or shortness of breath.       Disposition: Home Diet recommendation: Cardiac diet  Discharge  Exam: Vitals:   03/16/24 0302 03/16/24 0500 03/16/24 0736 03/16/24 1110  BP:   108/67 115/88  Pulse:   93 81  Resp: 18   18  Temp: (!) 97.4 F (36.3 C)  98.6 F (37 C) 98.6 F (37 C)  TempSrc: Axillary  Oral Oral  SpO2:   95% 97%  Weight:  71.1 kg    Height:       General: in Mild distress, No Rash Cardiovascular: S1 and S2 Present, No Murmur Respiratory: Good respiratory effort, Bilateral Air entry present. No Crackles, No wheezes Abdomen: Bowel Sound present, No tenderness Extremities: No edema Neuro: Alert and oriented x3, no new focal deficit   Filed Weights   03/14/24 0425 03/15/24 0500 03/16/24 0500  Weight: 68 kg 69.1 kg 71.1 kg   Condition at discharge: stable  The results of significant diagnostics from this hospitalization (including imaging, microbiology, ancillary and laboratory) are listed below for reference.   Imaging Studies: VAS US  LOWER EXTREMITY VENOUS (DVT) Result Date: 03/09/2024  Lower Venous DVT Study Patient Name:  JUVENCIO VERDI  Date of Exam:   03/09/2024 Medical Rec #: [id number]       Accession #:    [id number] Date of Birth: July 18, 1960       Patient Gender: M Patient Age:   63 years Exam Location:  [facility] Procedure:      VAS US  LOWER EXTREMITY VENOUS (DVT) Referring Phys: ARY CUMMINS --------------------------------------------------------------------------------  Indications: Stroke, and embolic stroke. Other Indications: Weakness, declining mental status. Comparison Study: No prior exam. Performing Technologist: Edilia Elden Appl  Examination Guidelines: A complete evaluation includes B-mode imaging, spectral Doppler, color Doppler, and power Doppler as needed of all accessible portions of each vessel. Bilateral testing is considered an integral part of a complete examination. Limited examinations for reoccurring indications may be performed as noted. The reflux portion of the exam is performed with the patient in reverse Trendelenburg.   +---------+---------------+---------+-----------+----------+--------------+ RIGHT    CompressibilityPhasicitySpontaneityPropertiesThrombus Aging +---------+---------------+---------+-----------+----------+--------------+ CFV      Full           Yes      Yes                                 +---------+---------------+---------+-----------+----------+--------------+ SFJ      Full           Yes      Yes                                 +---------+---------------+---------+-----------+----------+--------------+ FV Prox  Full                                                        +---------+---------------+---------+-----------+----------+--------------+  FV Mid   Full                                                        +---------+---------------+---------+-----------+----------+--------------+ FV DistalFull                                                        +---------+---------------+---------+-----------+----------+--------------+ PFV      Full                                                        +---------+---------------+---------+-----------+----------+--------------+ POP      Full           Yes      Yes                                 +---------+---------------+---------+-----------+----------+--------------+ PTV      Full                                                        +---------+---------------+---------+-----------+----------+--------------+ PERO     Full                                                        +---------+---------------+---------+-----------+----------+--------------+   +---------+---------------+---------+-----------+----------+--------------+ LEFT     CompressibilityPhasicitySpontaneityPropertiesThrombus Aging +---------+---------------+---------+-----------+----------+--------------+ CFV      Full           Yes      Yes                                  +---------+---------------+---------+-----------+----------+--------------+ SFJ      Full           Yes      Yes                                 +---------+---------------+---------+-----------+----------+--------------+ FV Prox  Full                                                        +---------+---------------+---------+-----------+----------+--------------+ FV Mid   Full                                                        +---------+---------------+---------+-----------+----------+--------------+  FV DistalFull                                                        +---------+---------------+---------+-----------+----------+--------------+ PFV      Full                                                        +---------+---------------+---------+-----------+----------+--------------+ POP      Full           Yes      Yes                                 +---------+---------------+---------+-----------+----------+--------------+ PTV      Full                                                        +---------+---------------+---------+-----------+----------+--------------+ PERO     Full                                                        +---------+---------------+---------+-----------+----------+--------------+     Summary: BILATERAL: - No evidence of deep vein thrombosis seen in the lower extremities, bilaterally. -No evidence of popliteal cyst, bilaterally.   *See table(s) above for measurements and observations. Electronically signed by Debby Robertson on 03/09/2024 at 4:08:56 PM.    Final    ECHOCARDIOGRAM COMPLETE Result Date: 03/08/2024    ECHOCARDIOGRAM REPORT   Patient Name:   Casey Warren Date of Exam: 03/08/2024 Medical Rec #:  [id number]      Height:       64.0 in Accession #:    [id number]     Weight:       147.5 lb Date of Birth:  1961-01-06      BSA:          1.719 m Patient Age:    63 years       BP:           149/87 mmHg Patient Gender: M               HR:           85 bpm. Exam Location:  Inpatient Procedure: 2D Echo, Cardiac Doppler and Color Doppler (Both Spectral and Color            Flow Doppler were utilized during procedure). Indications:    Stroke I63.9  History:        Patient has no prior history of Echocardiogram examinations.                 Stroke; Risk Factors:Current Smoker. H/O Hypotension, Alchohol                 abuse.  Sonographer:    BERNARDA ROCKS Referring Phys: [id number] SALMAN  KHALIQDINA IMPRESSIONS  1. Left ventricular ejection fraction, by estimation, is 60 to 65%. The left ventricle has normal function. The left ventricle has no regional wall motion abnormalities. Left ventricular diastolic parameters were normal.  2. Right ventricular systolic function is normal. The right ventricular size is normal.  3. The mitral valve is normal in structure. No evidence of mitral valve regurgitation. No evidence of mitral stenosis.  4. The aortic valve is normal in structure. Aortic valve regurgitation is not visualized. No aortic stenosis is present.  5. The inferior vena cava is normal in size with greater than 50% respiratory variability, suggesting right atrial pressure of 3 mmHg. FINDINGS  Left Ventricle: Left ventricular ejection fraction, by estimation, is 60 to 65%. The left ventricle has normal function. The left ventricle has no regional wall motion abnormalities. The left ventricular internal cavity size was normal in size. There is  no left ventricular hypertrophy. Left ventricular diastolic parameters were normal. Right Ventricle: The right ventricular size is normal. No increase in right ventricular wall thickness. Right ventricular systolic function is normal. Left Atrium: Left atrial size was normal in size. Right Atrium: Right atrial size was normal in size. Pericardium: There is no evidence of pericardial effusion. Mitral Valve: The mitral valve is normal in structure. No evidence of mitral valve regurgitation. No evidence  of mitral valve stenosis. MV peak gradient, 3.7 mmHg. The mean mitral valve gradient is 2.0 mmHg. Tricuspid Valve: The tricuspid valve is normal in structure. Tricuspid valve regurgitation is not demonstrated. No evidence of tricuspid stenosis. Aortic Valve: The aortic valve is normal in structure. Aortic valve regurgitation is not visualized. No aortic stenosis is present. Aortic valve mean gradient measures 3.0 mmHg. Aortic valve peak gradient measures 8.0 mmHg. Aortic valve area, by VTI measures 4.60 cm. Pulmonic Valve: The pulmonic valve was normal in structure. Pulmonic valve regurgitation is not visualized. No evidence of pulmonic stenosis. Aorta: The aortic root is normal in size and structure. Venous: The inferior vena cava is normal in size with greater than 50% respiratory variability, suggesting right atrial pressure of 3 mmHg. IAS/Shunts: The atrial septum is grossly normal.  LEFT VENTRICLE PLAX 2D LVIDd:         3.90 cm      Diastology LVIDs:         2.80 cm      LV e' medial:    7.62 cm/s LV PW:         1.00 cm      LV E/e' medial:  10.9 LV IVS:        1.10 cm      LV e' lateral:   8.81 cm/s LVOT diam:     2.30 cm      LV E/e' lateral: 9.4 LV SV:         104 LV SV Index:   60 LVOT Area:     4.15 cm  LV Volumes (MOD) LV vol d, MOD A2C: 112.0 ml LV vol d, MOD A4C: 138.0 ml LV vol s, MOD A2C: 35.7 ml LV vol s, MOD A4C: 44.1 ml LV SV MOD A2C:     76.3 ml LV SV MOD A4C:     138.0 ml LV SV MOD BP:      87.6 ml RIGHT VENTRICLE             IVC RV Basal diam:  2.40 cm     IVC diam: 1.80 cm RV S prime:     20.90  cm/s TAPSE (M-mode): 2.4 cm RVSP:           9.1 mmHg LEFT ATRIUM             Index        RIGHT ATRIUM           Index LA diam:        3.90 cm 2.27 cm/m   RA Pressure: 3.00 mmHg LA Vol (A2C):   43.2 ml 25.13 ml/m  RA Area:     13.10 cm LA Vol (A4C):   42.5 ml 24.73 ml/m  RA Volume:   30.20 ml  17.57 ml/m LA Biplane Vol: 43.7 ml 25.42 ml/m  AORTIC VALVE                    PULMONIC VALVE AV Area  (Vmax):    4.07 cm     PV Vmax:          1.03 m/s AV Area (Vmean):   4.42 cm     PV Peak grad:     4.3 mmHg AV Area (VTI):     4.60 cm     PR End Diast Vel: 1.35 msec AV Vmax:           141.00 cm/s AV Vmean:          78.700 cm/s AV VTI:            0.226 m AV Peak Grad:      8.0 mmHg AV Mean Grad:      3.0 mmHg LVOT Vmax:         138.00 cm/s LVOT Vmean:        83.800 cm/s LVOT VTI:          0.250 m LVOT/AV VTI ratio: 1.11  AORTA Ao Root diam: 3.80 cm Ao Asc diam:  3.80 cm MITRAL VALVE               TRICUSPID VALVE MV Area (PHT): 2.87 cm    TR Peak grad:   6.1 mmHg MV Area VTI:   4.15 cm    TR Vmax:        123.00 cm/s MV Peak grad:  3.7 mmHg    Estimated RAP:  3.00 mmHg MV Mean grad:  2.0 mmHg    RVSP:           9.1 mmHg MV Vmax:       0.96 m/s MV Vmean:      64.6 cm/s   SHUNTS MV Decel Time: 264 msec    Systemic VTI:  0.25 m MV E velocity: 83.20 cm/s  Systemic Diam: 2.30 cm MV A velocity: 80.80 cm/s MV E/A ratio:  1.03 Morene Brownie Electronically signed by Morene Brownie Signature Date/Time: 03/08/2024/3:57:04 PM    Final    CT ANGIO HEAD NECK W WO CM Result Date: 03/08/2024 CLINICAL DATA:  Initial evaluation for neuro deficit, stroke. EXAM: CT ANGIOGRAPHY HEAD AND NECK WITH AND WITHOUT CONTRAST TECHNIQUE: Multidetector CT imaging of the head and neck was performed using the standard protocol during bolus administration of intravenous contrast. Multiplanar CT image reconstructions and MIPs were obtained to evaluate the vascular anatomy. Carotid stenosis measurements (when applicable) are obtained utilizing NASCET criteria, using the distal internal carotid diameter as the denominator. RADIATION DOSE REDUCTION: This exam was performed according to the departmental dose-optimization program which includes automated exposure control, adjustment of the mA and/or kV according to patient size and/or use of iterative reconstruction technique. CONTRAST:  75mL OMNIPAQUE  IOHEXOL  350 MG/ML SOLN COMPARISON:  MRI from  earlier the same day as well as prior CTA from 08/09/2023. FINDINGS: CT HEAD FINDINGS Brain: Atrophy with chronic small vessel ischemic disease and multiple chronic infarcts noted, described on MRI oral the same day. Superimposed acute ischemic infarcts involving the bilateral cerebral hemispheres, also better seen on prior MRI. No new large vessel territory infarct. No acute intracranial hemorrhage. No mass lesion or midline shift. Stable ventricular size without hydrocephalus. No extra-axial fluid collection. Vascular: No abnormal hyperdense vessel. Skull: Scalp soft tissues and calvarium demonstrate no new finding. Sinuses/Orbits: Globes orbital soft tissues within normal limits. Paranasal sinuses are largely clear. Small left mastoid effusion noted. Nasogastric tube in place. Other: None. Review of the MIP images confirms the above findings CTA NECK FINDINGS Aortic arch: Bovine branching. Imaged portion shows no evidence of aneurysm or dissection. No significant stenosis of the major arch vessel origins. Mild aortic atherosclerosis. Right carotid system: Right common and internal carotid arteries are patent without dissection. Mild atheromatous change about the right carotid bulb without stenosis. Left carotid system: Left common and internal carotid arteries are patent without dissection. Mild atheromatous change about the left carotid bulb without stenosis. Vertebral arteries: No evidence of dissection, stenosis (50% or greater), or occlusion. Skeleton: No worrisome osseous lesions. Mild-to-moderate spondylosis at C4-5. Other neck: No other acute finding. Upper chest: 5 mm left upper lobe pulmonary nodule (series 11, image 168). Review of the MIP images confirms the above findings CTA HEAD FINDINGS Anterior circulation: Is atheromatous plaque about the carotid siphons with associated mild to moderate narrowing bilaterally, similar to prior. A1 segments patent. Normal anterior communicating artery complex. Both  ACAs patent to their distal aspects without significant stenosis. No M1 stenosis or occlusion. No proximal MCA branch occlusion or high-grade stenosis. Distal MCA branches remain perfused and fairly symmetric. Distal small vessel atheromatous irregularity noted. Posterior circulation: Left V4 segment patent without stenosis. Atheromatous plaque within the right V4 segment with short-segment moderate stenosis, beyond the takeoff of the right PICA, stable. Both PICA are patent. Basilar patent without stenosis. Superior cerebellar arteries patent bilaterally. Both PCAs primarily supplied via the basilar. PCAs are mildly irregular but patent to their distal aspects without significant stenosis. Venous sinuses: Not well assessed due to timing the contrast bolus. Anatomic variants: None significant.  No intracranial aneurysm. Review of the MIP images confirms the above findings IMPRESSION: CT HEAD: 1. Acute ischemic nonhemorrhagic infarcts involving the right basal ganglia, left corona radiata, and mesial left temporal lobe, better seen on prior brain MRI from earlier today. 2. No other new acute intracranial abnormality. 3. Underlying atrophy with chronic small vessel ischemic disease and multiple chronic infarcts, stable. CTA HEAD AND NECK: 1. Negative CTA for large vessel occlusion or other emergent finding. Stable CTA as compared to 08/09/2023. 2. Atheromatous plaque about the carotid siphons with associated mild to moderate narrowing bilaterally, similar to prior. 3. Short-segment moderate stenosis involving the right V4 segment, stable. 4. 5 mm left upper lobe pulmonary nodule, indeterminate. This was seen on recent chest CT from 03/02/2024. Please refer to that examination regarding any potential follow-up recommendations regarding this finding. Aortic Atherosclerosis (ICD10-I70.0). Electronically Signed   By: Morene Hoard M.D.   On: 03/08/2024 01:45   MR BRAIN WO CONTRAST Result Date: 03/07/2024 EXAM:  MRI BRAIN WITHOUT CONTRAST 03/07/2024 11:05:00 AM TECHNIQUE: Multiplanar multisequence MRI of the head/brain was performed without the administration of intravenous contrast. COMPARISON: CT of the head dated 03/04/2024  and MRI of the head dated 07/09/2016. CLINICAL HISTORY: Mental status change, unknown cause. FINDINGS: BRAIN AND VENTRICLES: Foci of restricted diffusion within the right basal ganglia, left corona radiata, and left medial temporal lobe. No acute intracranial hemorrhage. No mass. No midline shift. No hydrocephalus. Prominent cerebral volume loss, which is advanced with the patient's age. Chronic encephalomalacia changes within the left cerebellar hemisphere. Chronic lacunar infarcts within the right basal ganglia, right thalamus, and within the pons bilaterally. Extensive confluent cerebral white matter disease. The sella is unremarkable. Normal flow voids. ORBITS: No acute abnormality. SINUSES AND MASTOIDS: No acute abnormality. BONES AND SOFT TISSUES: Normal marrow signal. No acute soft tissue abnormality. IMPRESSION: 1. Acute infarcts with restricted diffusion in the right basal ganglia, left corona radiata, and left medial temporal lobe. 2. 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 3. 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 4. Extensive confluent cerebral white matter disease. 5. Prominent cerebral volume loss, advanced for age. Electronically signed by: Evalene Coho MD 03/07/2024 11:45 AM EDT RP Workstation: [id number]   EEG adult Result Date: 03/07/2024 Matthews Elida HERO, MD     03/07/2024 11:03 AM Routine EEG Report Casey Warren is a 63 y.o. male with a history of seizure who is undergoing an EEG to evaluate for seizures. Report: This EEG was acquired with electrodes placed according to the International 10-20 electrode system (including Fp1, Fp2, F3, F4, C3, C4, P3, P4, O1, O2, T3, T4, T5, T6, A1, A2, Fz, Cz, Pz). The  following electrodes were missing or displaced: none. The occipital dominant rhythm was 9 Hz. This activity is reactive to stimulation. Drowsiness was manifested by background fragmentation; deeper stages of sleep were identified by K complexes and sleep spindles. There was bitemporal focal slowing. There were rare sharp waves over the left temporal region. There were no electrographic seizures identified. There was no abnormal response to photic stimulation or hyperventilation. Impression and clinical correlation: This EEG was obtained while awake and asleep and is abnormal due to: - Focal bitemporal slowing indicative of focal cerebral dysfunction in those regions - Rare left temporal sharp waves indicative of increased epileptogenic potential in those regions Elida Matthews, MD [facility] [phone] If 7pm- 7am, please page neurology on call as listed in AMION.   DG Chest Port 1V same Day Result Date: 03/06/2024 CLINICAL DATA:  Shortness of breath EXAM: PORTABLE CHEST 1 VIEW COMPARISON:  03/04/2024 FINDINGS: Heart and mediastinal contours within normal limits. Diffuse interstitial prominence throughout the lungs. No confluent opacities or effusions. Feeding tube in place with the tip in the distal stomach. IMPRESSION: Diffuse interstitial prominence could reflect edema or atypical infection. Electronically Signed   By: Franky Crease M.D.   On: 03/06/2024 19:27   DG Chest Port 1 View Result Date: 03/04/2024 CLINICAL DATA:  Shortness of breath. EXAM: PORTABLE CHEST 1 VIEW COMPARISON:  03/02/2024 FINDINGS: Lordotic technique is demonstrated. Lungs are adequately inflated and otherwise clear. Cardiomediastinal silhouette and remainder of the exam is unchanged. IMPRESSION: No active disease. Electronically Signed   By: Toribio Agreste M.D.   On: 03/04/2024 09:07   CT HEAD WO CONTRAST ( ) Result Date: 03/04/2024 EXAM: CT HEAD WITHOUT CONTRAST 03/04/2024 05:16:17 AM TECHNIQUE: CT of the head was  performed without the administration of intravenous contrast. Automated exposure control, iterative reconstruction, and/or weight based adjustment of the mA/kV was utilized to reduce the radiation dose to as low as reasonably achievable. COMPARISON: CT of the head dated  03/02/2024. CLINICAL HISTORY: Head trauma, abnormal mental status (Age 85-62y); hospital fall. Hospital fall, ams. FINDINGS: BRAIN AND VENTRICLES: No acute hemorrhage. No evidence of acute infarct. No hydrocephalus. No extra-axial collection. No mass effect or midline shift. Moderate cerebral and cerebellar volume loss present, which is accelerated for the patient's age. Chronic lacunar infarcts within the right basal ganglia and internal capsule as well as the right thalamus. Chronic lacunar infarcts present within the pons bilaterally. Chronic encephalomalacia changes within the cerebellar hemispheres bilaterally. Extensive cerebral white matter disease. ORBITS: No acute abnormality. SINUSES: No acute abnormality. SOFT TISSUES AND SKULL: No acute soft tissue abnormality. No skull fracture. IMPRESSION: 1. No acute intracranial abnormality related to the reported head trauma and hospital fall. 2. Moderate cerebral and cerebellar volume loss, accelerated for the patient's age. 3. Chronic lacunar infarcts within the right basal ganglia, internal capsule, right thalamus, and pons bilaterally. 4. Chronic encephalomalacia changes within the cerebellar hemispheres bilaterally. 5. Extensive cerebral white matter disease. Electronically signed by: Evalene Coho MD 03/04/2024 05:48 AM EDT RP Workstation: [id number]   US  THYROID  Result Date: 03/03/2024 CLINICAL DATA:  Hyperthyroid. EXAM: THYROID  ULTRASOUND TECHNIQUE: Ultrasound examination of the thyroid  gland and adjacent soft tissues was performed. COMPARISON:  None Available. FINDINGS: Parenchymal Echotexture: Moderately heterogenous Isthmus: 0.5 cm Right lobe: 4.2 x 2.1 x 1.7 cm Left lobe: 4.2 x 1.8  x 1.8 cm _________________________________________________________ Estimated total number of nodules >/= 1 cm: 0 Number of spongiform nodules >/=  2 cm not described below (TR1): 0 Number of mixed cystic and solid nodules >/= 1.5 cm not described below (TR2): 0 _________________________________________________________ Moderately heterogeneous thyroid  gland. The gland is normal in size. No significant hypervascularity on color Doppler imaging. Incidental note is made of a tiny 9 mm hyperechoic nodule in the left mid gland. This is considered TI-RADS category 3. Given size (<1.4 cm) and appearance, this nodule does NOT meet TI-RADS criteria for biopsy or dedicated follow-up. IMPRESSION: Moderately heterogeneous thyroid  gland. Differential considerations include active thyroiditis. No significant hypervascularity and no nodules identified that would warrant further evaluation. The above is in keeping with the ACR TI-RADS recommendations - J Am Coll Radiol 2017;14:587-595. Electronically Signed   By: Wilkie Lent M.D.   On: 03/03/2024 13:51   CT CHEST ABDOMEN PELVIS WO CONTRAST Result Date: 03/02/2024 CLINICAL DATA:  Pneumonia, complication suspected, xray done. Hypotension, altered mental state. EXAM: CT CHEST, ABDOMEN AND PELVIS WITHOUT CONTRAST TECHNIQUE: Multidetector CT imaging of the chest, abdomen and pelvis was performed following the standard protocol without IV contrast. RADIATION DOSE REDUCTION: This exam was performed according to the departmental dose-optimization program which includes automated exposure control, adjustment of the mA and/or kV according to patient size and/or use of iterative reconstruction technique. COMPARISON:  None Available. FINDINGS: CT CHEST FINDINGS Cardiovascular: Heart is normal size. Aorta is normal caliber. Coronary artery and aortic atherosclerosis. Mediastinum/Nodes: No mediastinal, hilar, or axillary adenopathy. Trachea and esophagus are unremarkable. Thyroid   unremarkable. Lungs/Pleura: Mild centrilobular emphysema. Several small scattered pulmonary nodules. Index lingular nodule on image 72 measures 3 mm. Posterior right upper lobe nodule measures 5 mm on image 43. Few other scattered 1-2 mm nodules. No confluent airspace opacities or effusions. Musculoskeletal: Chest wall soft tissues are unremarkable. Chronic wedge deformity of the T11 vertebral body, stable since 01/13/2017 chest x-ray. No acute bony abnormality. CT ABDOMEN PELVIS FINDINGS Hepatobiliary: No focal hepatic abnormality. Gallbladder unremarkable. Pancreas: No focal abnormality or ductal dilatation. Spleen: No focal abnormality.  Normal size. Adrenals/Urinary Tract: No adrenal  abnormality. No focal renal abnormality. No stones or hydronephrosis. Urinary bladder is unremarkable. Stomach/Bowel: Scattered colonic diverticulosis. No active diverticulitis. Normal appendix. Stomach and small bowel decompressed. No bowel obstruction or inflammatory process. Vascular/Lymphatic: Aortic atherosclerosis. No evidence of aneurysm or adenopathy. Reproductive: No visible focal abnormality. Other: No free fluid or free air. Musculoskeletal: No acute bony abnormality. IMPRESSION: No acute findings in the chest, abdomen or pelvis. Scattered bilateral pulmonary nodules measuring up to 5 mm. No follow-up needed if patient is low-risk (and has no known or suspected primary neoplasm). Non-contrast chest CT can be considered in 12 months if patient is high-risk. This recommendation follows the consensus statement: Guidelines for Management of Incidental Pulmonary Nodules Detected on CT Images: From the Fleischner Society 2017; Radiology 2017; 284:228-243. Coronary artery disease.  Aortic atherosclerosis. Sigmoid diverticulosis. Electronically Signed   By: Franky Crease M.D.   On: 03/02/2024 19:35   CT Head Wo Contrast Result Date: 03/02/2024 CLINICAL DATA:  Altered mental status. EXAM: CT HEAD WITHOUT CONTRAST TECHNIQUE:  Contiguous axial images were obtained from the base of the skull through the vertex without intravenous contrast. RADIATION DOSE REDUCTION: This exam was performed according to the departmental dose-optimization program which includes automated exposure control, adjustment of the mA and/or kV according to patient size and/or use of iterative reconstruction technique. COMPARISON:  August 09, 2023 FINDINGS: Brain: There is generalized cerebral atrophy with widening of the extra-axial spaces and ventricular dilatation. There are areas of decreased attenuation within the white matter tracts of the supratentorial brain, consistent with microvascular disease changes. Chronic inferior cerebellar infarcts are seen with chronic lacunar infarcts noted within the pons and basal ganglia on the right. Vascular: Marked severity bilateral cavernous carotid artery calcification is noted. Skull: Normal. Negative for fracture or focal lesion. Sinuses/Orbits: No acute finding. Other: None. IMPRESSION: 1. Generalized cerebral atrophy with chronic inferior cerebellar infarcts and chronic lacunar infarcts within the pons and basal ganglia on the right. 2. No acute intracranial abnormality. Electronically Signed   By: Suzen Dials M.D.   On: 03/02/2024 19:33   DG Chest Port 1 View Result Date: 03/02/2024 CLINICAL DATA:  Hypotension altered mental state EXAM: PORTABLE CHEST - 1 VIEW COMPARISON:  July 01, 2022 FINDINGS: Lower lung volumes. No focal airspace consolidation, pleural effusion, or pneumothorax. No cardiomegaly. No acute fracture or destructive lesions. Multilevel thoracic osteophytosis. IMPRESSION: No acute cardiopulmonary abnormality. Electronically Signed   By: Rogelia Myers M.D.   On: 03/02/2024 16:56    Microbiology: Results for orders placed or performed during the hospital encounter of 03/02/24  Culture, blood (Routine X 2) w Reflex to ID Panel     Status: None   Collection Time: 03/02/24  4:50 PM    Specimen: BLOOD LEFT FOREARM  Result Value Ref Range Status   Specimen Description BLOOD LEFT FOREARM  Final   Special Requests   Final    BOTTLES DRAWN AEROBIC AND ANAEROBIC Blood Culture results may not be optimal due to an inadequate volume of blood received in culture bottles   Culture   Final    NO GROWTH 5 DAYS Performed at [facility], [address], [place], [place] [address]    Report Status 03/07/2024 FINAL  Final  Culture, blood (Routine X 2) w Reflex to ID Panel     Status: None   Collection Time: 03/02/24  9:08 PM   Specimen: BLOOD RIGHT HAND  Result Value Ref Range Status   Specimen Description BLOOD RIGHT HAND  Final  Special Requests   Final    BOTTLES DRAWN AEROBIC ONLY Blood Culture adequate volume   Culture   Final    NO GROWTH 5 DAYS Performed at [facility], [address] [place], [place] [address]    Report Status 03/07/2024 FINAL  Final  Culture, blood (Routine X 2) w Reflex to ID Panel     Status: None   Collection Time: 03/06/24  8:07 PM   Specimen: BLOOD RIGHT ARM  Result Value Ref Range Status   Specimen Description BLOOD RIGHT ARM  Final   Special Requests   Final    BOTTLES DRAWN AEROBIC AND ANAEROBIC Blood Culture adequate volume   Culture   Final    NO GROWTH 5 DAYS Performed at [facility], [address], [place], [place] [address]    Report Status 03/11/2024 FINAL  Final  Culture, blood (Routine X 2) w Reflex to ID Panel     Status: None   Collection Time: 03/06/24  8:10 PM   Specimen: BLOOD RIGHT ARM  Result Value Ref Range Status   Specimen Description BLOOD RIGHT ARM  Final   Special Requests   Final    BOTTLES DRAWN AEROBIC AND ANAEROBIC Blood Culture adequate volume   Culture   Final    NO GROWTH 5 DAYS Performed at [facility], [address], [place], [place] [address]    Report Status 03/11/2024 FINAL  Final   Labs: CBC: Recent Labs  Lab 03/11/24 0503 03/12/24 0442 03/13/24 0841  03/14/24 0354 03/15/24 0650  WBC 15.3* 15.0* 10.0 8.7 7.4  NEUTROABS 10.2* 10.0* 6.2 5.0 4.5  HGB 10.8* 11.1* 10.7* 10.9* 10.1*  HCT 31.6* 31.8* 30.8* 31.8* 29.9*  MCV 86.6 86.2 85.8 86.6 88.2  PLT 222 243 278 291 290   Basic Metabolic Panel: Recent Labs  Lab 03/12/24 0442 03/12/24 1748 03/13/24 0841 03/14/24 0354 03/15/24 0650 03/16/24 1118  NA 141  --  141 138 139 140  K 3.5  --  3.5 3.5 3.8 3.8  CL 116*  --  114* 111 115* 112*  CO2 16*  --  18* 18* 18* 19*  GLUCOSE 99  --  93 95 92 126*  BUN 9  --  6* 7* 5* 5*  CREATININE 0.92  --  0.95 0.90 0.90 0.99  CALCIUM  8.2*  --  8.3* 8.1* 8.2* 8.4*  MG 1.9 1.5* 1.4* 1.9 1.5* 1.7  PHOS 3.7 3.8 4.6 4.0 3.7  --    Liver Function Tests: Recent Labs  Lab 03/12/24 0442 03/13/24 0841 03/14/24 0354 03/15/24 0650 03/16/24 1118  AST 109* 61* 50* 39 36  ALT 326* 253* 224* 177* 149*  ALKPHOS 120 115 145* 133* 145*  BILITOT 0.3 0.5 0.4 0.5 0.5  PROT 5.5* 5.0* 5.1* 5.0* 5.4*  ALBUMIN 2.5* 2.4* 2.4* 2.2* 2.3*   CBG: No results for input(s): GLUCAP in the last 168 hours.  Discharge time spent: greater than 30 minutes.  Author: Yetta Blanch, MD  [facility] 03/16/2024

## 2024-03-17 NOTE — Transitions of Care (Post Inpatient/ED Visit) (Signed)
 03/17/2024  Name: Casey Warren MRN: [id number] DOB: 1960/07/25  Today's TOC FU Call Status: Today's TOC FU Call Status:: Successful TOC FU Call Completed TOC FU Call Complete Date: 03/17/24 Patient's Name and Date of Birth confirmed.  Transition Care Management Follow-up Telephone Call Date of Discharge: 03/16/24 Discharge Facility: [name]) Type of Discharge: Inpatient Admission Primary Inpatient Discharge Diagnosis:: Hypotension How have you been since you were released from the hospital?: Better Any questions or concerns?: No  Items Reviewed: Did you receive and understand the discharge instructions provided?: Yes Medications obtained,verified, and reconciled?: Yes (Medications Reviewed) Any new allergies since your discharge?: No Dietary orders reviewed?: Yes Type of Diet Ordered:: Low Sodium heart healthy Do you have support at home?: Yes People in Home [RPT]: spouse, child(ren), adult Name of Support/Comfort Primary Source: Daughter Arlinda  Medications Reviewed Today: Medications Reviewed Today     Reviewed by Klohe Lovering, RN (Case Manager) on 03/17/24 at 1337  Med List Status: <None>   Medication Order Taking? Sig Documenting Provider Last Dose Status Informant  albuterol  (VENTOLIN  HFA) 108 (90 Base) MCG/ACT inhaler [id number] Yes Inhale 2 puffs into the lungs every 6 (six) hours as needed for wheezing or shortness of breath. Celestia Rosaline SQUIBB, NP  Active Spouse/Significant Other  aspirin  EC 81 MG tablet [id number] Yes Take 81 mg by mouth daily. [provider]  Active Spouse/Significant Other  atenolol  (TENORMIN ) 25 MG tablet [id number] Yes Take 0.5 tablets (12.5 mg total) by mouth daily. Tobie Yetta HERO, MD  Active   atorvastatin  (LIPITOR) 20 MG tablet [id number] Yes TAKE 1 TABLET BY MOUTH EVERY DAY Celestia Rosaline SQUIBB, NP  Active Spouse/Significant Other  clopidogrel (PLAVIX) 75 MG tablet [id number] Yes Take 1 tablet (75 mg total) by mouth daily.  Tobie Yetta HERO, MD  Active   cyclobenzaprine  (FLEXERIL ) 5 MG tablet [id number] Yes Take 1 tablet (5 mg total) by mouth 3 (three) times daily as needed for muscle spasms. Tobie Yetta HERO, MD  Active   EPINEPHrine  0.3 mg/0.3 mL IJ SOAJ injection [id number] Yes Inject 0.3 mg into the muscle as needed for anaphylaxis. Celestia Rosaline SQUIBB, NP  Active Spouse/Significant Other  escitalopram  (LEXAPRO ) 20 MG tablet [id number] Yes Take 1 tablet (20 mg total) by mouth daily. Celestia Rosaline SQUIBB, NP  Active Spouse/Significant Other  feeding supplement (ENSURE PLUS HIGH PROTEIN) LIQD [id number] Yes Take 237 mLs by mouth 2 (two) times daily between meals. Tobie Yetta HERO, MD  Active   folic acid  (FOLVITE ) 1 MG tablet [id number] Yes Take 1 tablet (1 mg total) by mouth daily. Tobie Yetta HERO, MD  Active   loperamide (IMODIUM) 2 MG capsule [id number] Yes Take 1 capsule (2 mg total) by mouth as needed for diarrhea or loose stools. Tobie Yetta HERO, MD  Active   loratadine  (CLARITIN ) 10 MG tablet [id number] Yes TAKE 1 TABLET BY MOUTH EVERY DAY AS NEEDED FOR ALLERGY Celestia Rosaline SQUIBB, NP  Active Spouse/Significant Other  magnesium oxide (MAG-OX) 400 MG tablet [id number] Yes Take 1 tablet (400 mg total) by mouth 2 (two) times daily. Tobie Yetta HERO, MD  Active   methimazole  (TAPAZOLE ) 10 MG tablet [id number] Yes Take 2 tablets (20 mg total) by mouth daily. Tobie Yetta HERO, MD  Active   Multiple Vitamin (MULTIVITAMIN WITH MINERALS) TABS tablet [id number] Yes Take 1 tablet by mouth daily. Tobie Yetta HERO, MD  Active   nicotine (NICODERM CQ - DOSED IN MG/24 HOURS) 21 mg/24hr patch [id number] Yes Place 1 patch (21  mg total) onto the skin daily. Tobie Yetta HERO, MD  Active   sodium bicarbonate 650 MG tablet [id number] Yes Take 1 tablet (650 mg total) by mouth 2 (two) times daily. Tobie Yetta HERO, MD  Active   thiamine  (VITAMIN B-1) 100 MG tablet [id number] Yes Take 2 tablets (200 mg total) by mouth daily. Tobie Yetta HERO, MD  Active    topiramate  (TOPAMAX ) 100 MG tablet [id number] Yes Take 100 mg by mouth 2 (two) times daily. [provider]  Active Spouse/Significant Other            Home Care and Equipment/Supplies: Were Home Health Services Ordered?: Yes Name of Home Health Agency:: Daughter states they chose [facility].  She is waiting on call but will call if no call received Has Agency set up a time to come to your home?: No Any new equipment or medical supplies ordered?: Yes Name of Medical supply agency?: [employer]- has rolling walker and BSC Were you able to get the equipment/medical supplies?: Yes Do you have any questions related to the use of the equipment/supplies?: No  Functional Questionnaire: Do you need assistance with bathing/showering or dressing?: No Do you need assistance with meal preparation?: No Do you need assistance with eating?: No Do you have difficulty maintaining continence: No Do you need assistance with getting out of bed/getting out of a chair/moving?: No Do you have difficulty managing or taking your medications?: Yes (wife sets up pill box)  Follow up appointments reviewed: PCP Follow-up appointment confirmed?: Yes Date of PCP follow-up appointment?: 03/23/24 Follow-up Provider: Rosina Amy Specialist [facility] Follow-up appointment confirmed?: No Reason Specialist Follow-Up Not Confirmed: Patient has Specialist Provider Number and will Call for Appointment Do you need transportation to your follow-up appointment?: No Do you understand care options if your condition(s) worsen?: Yes-patient verbalized understanding  SDOH Interventions Today    Flowsheet Row Most Recent Value  SDOH Interventions   Food Insecurity Interventions Intervention Not Indicated  Housing Interventions Intervention Not Indicated  Transportation Interventions Intervention Not Indicated  Utilities Interventions Intervention Not Indicated    Brooke Payes J. Woodward Klem RN, MSN [facility]  [facility], [facility] Health RN Care Manager Direct Dial: [phone]  Fax: [phone] Website: [email or web address]

## 2024-03-17 NOTE — Patient Instructions (Addendum)
 Visit Information  Thank you for taking time to visit with me today.   Symptoms to Watch For:   Dizziness or lightheadedness  Fainting  Blurred or narrowed vision  Nausea  Fatigue  Cold, clammy skin  Rapid, shallow breathing  Weak or rapid pulse    Patient verbalizes understanding of instructions and care plan provided today and agrees to view in MyChart. Active MyChart status and patient understanding of how to access instructions and care plan via MyChart confirmed with patient.     The patient has been provided with contact information for the care management team and has been advised to call with any health related questions or concerns.   Please call the care guide team at [phone] if you need to cancel or reschedule your appointment.   Please call the Suicide and Crisis Lifeline: 988 if you are experiencing a Mental Health or Behavioral Health Crisis or need someone to talk to.  Kenyata Napier J. Daishon Chui RN, MSN [facility], [facility] RN Care Manager Direct Dial: [phone]  Fax: [phone] Website: [email or web address]

## 2024-03-22 ENCOUNTER — Telehealth (INDEPENDENT_AMBULATORY_CARE_PROVIDER_SITE_OTHER): Payer: Self-pay

## 2024-03-22 NOTE — Telephone Encounter (Signed)
 Copied from CRM [id number]. Topic: Clinical - Home Health Verbal Orders >> Mar 19, 2024 10:44 AM Rosaria BRAVO wrote: Caller/Agency: Debbie from [facility]  Callback Number: [phone] Service Requested: Occupational Therapy and Physical Therapy Frequency: Requesting start of services for 03/24/2024 Any new concerns about the patient?

## 2024-03-22 NOTE — Telephone Encounter (Signed)
 Returned [employer] and provided verbal orders

## 2024-03-26 DIAGNOSIS — G40909 Epilepsy, unspecified, not intractable, without status epilepticus: Secondary | ICD-10-CM | POA: Diagnosis not present

## 2024-03-26 DIAGNOSIS — R7303 Prediabetes: Secondary | ICD-10-CM | POA: Diagnosis not present

## 2024-03-26 DIAGNOSIS — J302 Other seasonal allergic rhinitis: Secondary | ICD-10-CM | POA: Diagnosis not present

## 2024-03-26 DIAGNOSIS — E782 Mixed hyperlipidemia: Secondary | ICD-10-CM | POA: Diagnosis not present

## 2024-03-26 DIAGNOSIS — R634 Abnormal weight loss: Secondary | ICD-10-CM | POA: Diagnosis not present

## 2024-03-26 DIAGNOSIS — I10 Essential (primary) hypertension: Secondary | ICD-10-CM | POA: Diagnosis not present

## 2024-03-26 DIAGNOSIS — Z720 Tobacco use: Secondary | ICD-10-CM | POA: Diagnosis not present

## 2024-03-26 DIAGNOSIS — F3341 Major depressive disorder, recurrent, in partial remission: Secondary | ICD-10-CM | POA: Diagnosis not present

## 2024-03-26 DIAGNOSIS — R531 Weakness: Secondary | ICD-10-CM | POA: Diagnosis not present

## 2024-03-26 NOTE — Telephone Encounter (Signed)
 Nat calling from [facility] is calling to request delay of service 03/31/2024 waiting on authorization from insurance. CB- [phone] Verbal ok Voicemail

## 2024-04-09 ENCOUNTER — Telehealth: Payer: Self-pay

## 2024-04-09 NOTE — Telephone Encounter (Signed)
 Copied from CRM [id number]. Topic: Clinical - Home Health Verbal Orders >> Apr 08, 2024  1:56 PM Delon HERO wrote: Cato calling from [facility] is calling to request PT. Frequency- 2 W 3 CB- [phone] Verbal ok on VB

## 2024-04-09 NOTE — Telephone Encounter (Signed)
 Orders give to Debbie on 03/22/2024 by CMA

## 2024-04-12 ENCOUNTER — Encounter: Payer: Self-pay | Admitting: Radiology

## 2024-04-12 ENCOUNTER — Telehealth (INDEPENDENT_AMBULATORY_CARE_PROVIDER_SITE_OTHER): Payer: Self-pay | Admitting: Primary Care

## 2024-04-12 NOTE — Telephone Encounter (Signed)
 Returned call to Will and made aware that pt is establish at [facility]

## 2024-04-12 NOTE — Telephone Encounter (Unsigned)
 Copied from CRM [id number]. Topic: Clinical - Home Health Verbal Orders >> Apr 12, 2024  2:47 PM Selinda RAMAN wrote: Caller/Agency: Will with [facility] Callback Number: [phone] Service Requested: Occupational Therapy Frequency: 2 x a week for 2 weeks Any new concerns about the patient? Yes as he called in with a Vital Sign Update as well. His blood pressure is 152/101. Please assist patient as soon as possible as the patient is on bp meds and took it at 6 am this morning as usual and is still reporting this high bp!

## 2024-04-16 ENCOUNTER — Other Ambulatory Visit: Payer: Self-pay | Admitting: Neurology

## 2024-04-19 ENCOUNTER — Ambulatory Visit: Admitting: Neurology

## 2024-04-19 ENCOUNTER — Encounter: Payer: Self-pay | Admitting: Neurology

## 2024-04-19 VITALS — BP 135/95 | HR 118 | Ht 64.0 in | Wt 137.0 lb

## 2024-04-19 DIAGNOSIS — I639 Cerebral infarction, unspecified: Secondary | ICD-10-CM | POA: Diagnosis not present

## 2024-04-19 DIAGNOSIS — F1090 Alcohol use, unspecified, uncomplicated: Secondary | ICD-10-CM | POA: Diagnosis not present

## 2024-04-19 DIAGNOSIS — I6381 Other cerebral infarction due to occlusion or stenosis of small artery: Secondary | ICD-10-CM

## 2024-04-19 NOTE — Telephone Encounter (Signed)
 Refill refused

## 2024-04-19 NOTE — Patient Instructions (Addendum)
 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

## 2024-04-19 NOTE — Telephone Encounter (Signed)
 Please have patient follow up with PCP regarding the muscle relaxant
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
## 2024-04-21 NOTE — Progress Notes (Deleted)
 Cardiology Office Note   Date:  04/21/2024   ID:  Casey, Warren 1961/01/16, MRN [id number]  PCP:  Rosalea Rosina SAILOR, PA  Cardiologist:   None Referring:  ***  No chief complaint on file.     History of Present Illness: Casey Warren is a 63 y.o. male who presents for evaluation of syncope and tachycardia.  ***     Past Medical History:  Diagnosis Date   Angio-edema    Anxiety    related to surgery   Asthma    GERD (gastroesophageal reflux disease)    History of blood transfusion    HTN (hypertension)    Seizures (HCC)    last seizure in 2023 per patient   Ulcer     Past Surgical History:  Procedure Laterality Date   CLOSED REDUCTION NASAL FRACTURE  11/19/2011   Procedure: CLOSED REDUCTION NASAL FRACTURE;  Surgeon: Marlyce Finer, MD;  Location: [facility] OR;  Service: ENT;  Laterality: N/A;  CLOSED REDUCTION NASAL AND NASAL SEPTAL FRACTURE WITH STABILIZATION   No surgical       Current Outpatient Medications  Medication Sig Dispense Refill   albuterol  (VENTOLIN  HFA) 108 (90 Base) MCG/ACT inhaler Inhale 2 puffs into the lungs every 6 (six) hours as needed for wheezing or shortness of breath. 18 g 2   aspirin  EC 81 MG tablet Take 81 mg by mouth daily.     atenolol  (TENORMIN ) 25 MG tablet Take 0.5 tablets (12.5 mg total) by mouth daily. 30 tablet 0   atorvastatin  (LIPITOR) 20 MG tablet TAKE 1 TABLET BY MOUTH EVERY DAY 90 tablet 1   clopidogrel (PLAVIX) 75 MG tablet Take 1 tablet (75 mg total) by mouth daily. 30 tablet 0   cyclobenzaprine  (FLEXERIL ) 5 MG tablet Take 1 tablet (5 mg total) by mouth 3 (three) times daily as needed for muscle spasms. 30 tablet 0   EPINEPHrine  0.3 mg/0.3 mL IJ SOAJ injection Inject 0.3 mg into the muscle as needed for anaphylaxis. 1 each 2   escitalopram  (LEXAPRO ) 20 MG tablet Take 1 tablet (20 mg total) by mouth daily. Needs office visit for additional refills. 30 tablet 0   feeding supplement (ENSURE PLUS HIGH PROTEIN) LIQD Take 237 mLs  by mouth 2 (two) times daily between meals. 10000 mL 0   ferrous sulfate 325 (65 FE) MG EC tablet Take 1 tablet by mouth daily.     folic acid  (FOLVITE ) 1 MG tablet Take 1 tablet (1 mg total) by mouth daily. 30 tablet 0   loperamide (IMODIUM) 2 MG capsule Take 1 capsule (2 mg total) by mouth as needed for diarrhea or loose stools. 30 capsule 0   loratadine  (CLARITIN ) 10 MG tablet TAKE 1 TABLET BY MOUTH EVERY DAY AS NEEDED FOR ALLERGY 90 tablet 1   magnesium oxide (MAG-OX) 400 MG tablet Take 1 tablet (400 mg total) by mouth 2 (two) times daily. 60 tablet 0   methimazole  (TAPAZOLE ) 10 MG tablet Take 2 tablets (20 mg total) by mouth daily. 30 tablet 0   Multiple Vitamin (MULTIVITAMIN WITH MINERALS) TABS tablet Take 1 tablet by mouth daily. 30 tablet 0   nicotine (NICODERM CQ - DOSED IN MG/24 HOURS) 21 mg/24hr patch Place 1 patch (21 mg total) onto the skin daily. 28 patch 0   sodium bicarbonate 650 MG tablet Take 1 tablet (650 mg total) by mouth 2 (two) times daily. 60 tablet 0   thiamine  (VITAMIN B-1) 100 MG tablet Take 2  tablets (200 mg total) by mouth daily. 30 tablet 0   topiramate  (TOPAMAX ) 100 MG tablet Take 100 mg by mouth 2 (two) times daily.     No current facility-administered medications for this visit.    Allergies:   Banana, Citrus, Chlorthalidone , and Penicillins    Social History:  The patient  reports that he has been smoking cigars and cigarettes. He has never been exposed to tobacco smoke. He has never used smokeless tobacco. He reports that he does not currently use alcohol. He reports that he does not use drugs.   Family History:  The patient's ***family history includes Allergic rhinitis in his sister; Asthma in his grandson; Breast cancer in his mother; Diabetes in his mother; Hypertension in his mother; Leukemia in his father; Stroke in his mother.    ROS:  Please see the history of present illness.   Otherwise, review of systems are positive for {NONE DEFAULTED:18576}.    All other systems are reviewed and negative.    PHYSICAL EXAM: VS:  There were no vitals taken for this visit. , BMI There is no height or weight on file to calculate BMI. GENERAL:  Well appearing HEENT:  Pupils equal round and reactive, fundi not visualized, oral mucosa unremarkable NECK:  No jugular venous distention, waveform within normal limits, carotid upstroke brisk and symmetric, no bruits, no thyromegaly LYMPHATICS:  No cervical, inguinal adenopathy LUNGS:  Clear to auscultation bilaterally BACK:  No CVA tenderness CHEST:  Unremarkable HEART:  PMI not displaced or sustained,S1 and S2 within normal limits, no S3, no S4, no clicks, no rubs, *** murmurs ABD:  Flat, positive bowel sounds normal in frequency in pitch, no bruits, no rebound, no guarding, no midline pulsatile mass, no hepatomegaly, no splenomegaly EXT:  2 plus pulses throughout, no edema, no cyanosis no clubbing SKIN:  No rashes no nodules NEURO:  Cranial nerves II through XII grossly intact, motor grossly intact throughout PSYCH:  Cognitively intact, oriented to person place and time    EKG:        Recent Labs: 03/02/2024: TSH <0.100 03/03/2024: B Natriuretic Peptide 18.9 03/15/2024: Hemoglobin 10.1; Platelets 290 03/16/2024: ALT 149; BUN 5; Creatinine, Ser 0.99; Magnesium 1.7; Potassium 3.8; Sodium 140    Lipid Panel    Component Value Date/Time   CHOL 121 03/08/2024 0431   CHOL 131 07/29/2023 1336   TRIG 96 03/08/2024 0431   HDL 24 (L) 03/08/2024 0431   HDL 38 (L) 07/29/2023 1336   CHOLHDL 5.0 03/08/2024 0431   VLDL 19 03/08/2024 0431   LDLCALC 78 03/08/2024 0431   LDLCALC 75 07/29/2023 1336      Wt Readings from Last 3 Encounters:  04/19/24 137 lb (62.1 kg)  03/16/24 156 lb 12 oz (71.1 kg)  02/16/24 150 lb (68 kg)      Other studies Reviewed: Additional studies/ records that were reviewed today include: ***. Review of the above records demonstrates:  Please see elsewhere in the note.   ***   ASSESSMENT AND PLAN:  Acute encephalopathy/agitation/delirium/with alcohol:  ***   Alcohol abuse, w/ withdrawals Recent cognitive decline last Etoh with wine bottle on 02/26/24 per daughter,normally heavy drinker-he had weaned himself from beer for a month. Psych consulted. Treated with CIWA protocol and high-dose thiamine . MRI brain shows acute infarct in right basal ganglia, left corona radiata as well as medial temporal lobe and left. PT OT recommending CIR, insurance denied CIR.  SNF was recommended but pt progressed to remain eligible got HHPT.  Hyperthyroidism:  ***   Possible hypothyroid crisis Relative adrenal insufficiency w/ low cortisol: TSH undetectable T39.8 Free T4 3.25. TSI 5.08.  Elevated. Thyrotropin receptor antibody 8.45 elevated. A.m. cortisol at 5 AM was low, but Cosyntropin  test reassuring  Prior provider, discussed with outpatient [facility] Dr Mercie US  thryoid moderately heterogenous thyroid  gland- likely thyroiditis. Received PTU initially with and now on methimazole . Continue atenolol .  Also on Decadron  last dose of therapy 10/2.   Acute kidney injury:  ***   Metabolic acidosis Hypokalemia Hypophosphatemia. Hypomagnesemia. Hypernatremia. AKI in the setting of hypotension/poor oral intake.  Creatinine improved with IV hydration. Electrolyte replaced.    Hypotension:  ***   History of hypertension-PTA on amlodipine /valsartan /HCTZ: Blood pressure now stable.  Continue atenolol .   Acute right basal cannula, left corona radiata and left medial temporal lobe infarct:  ***   Neurology was consulted. Currently on aspirin  and Plavix for 3 weeks followed by Plavix alone.   Chronic anxiety/depression:   ***  Psychiatry was consulted.  Now signed off. Currently on Lexapro . No agitation seen.   Lung nodules: ***  3 mm lingular, 5 mm right upper lobe Follow-up in 12 month w/ CT chest   Severe protein-calorie malnutrition:  ***    Refeeding syndrome. Continue supplementation. Had a Cortrak which is now removed.   LFT elevation:  ***   Improving after bolus. Bilirubin normal. Suspect chronic liver injury from alcohol use. Appears that LFTs are responding to IV fluid.  Will continue IV fluid overnight and monitor response.   HLD:  ***   LDL 78. Was on statin. LFTs are elevated therefore not a candidate for high intensity statin. Currently on statin.   Steroid-induced leukocytosis:  ***   No evidence of clinical infection.  Monitor.   Non-anion gap metabolic acidosis:  ***   Likely from hyperchloremia.  As well as diarrhea. Patient remains asymptomatic.     Current medicines are reviewed at length with the patient today.  The patient {ACTIONS; HAS/DOES NOT HAVE:19233} concerns regarding medicines.  The following changes have been made:  {PLAN; NO CHANGE:13088:s}  Labs/ tests ordered today include: *** No orders of the defined types were placed in this encounter.    Disposition:   FU with ***    Signed, Lynwood Schilling, MD  04/21/2024 8:58 PM    [facility]

## 2024-04-22 ENCOUNTER — Ambulatory Visit: Attending: Cardiology | Admitting: Cardiology

## 2024-04-22 DIAGNOSIS — E0590 Thyrotoxicosis, unspecified without thyrotoxic crisis or storm: Secondary | ICD-10-CM

## 2024-04-22 DIAGNOSIS — N179 Acute kidney failure, unspecified: Secondary | ICD-10-CM

## 2024-04-22 DIAGNOSIS — R55 Syncope and collapse: Secondary | ICD-10-CM

## 2024-04-28 ENCOUNTER — Telehealth: Payer: Self-pay

## 2024-04-28 ENCOUNTER — Other Ambulatory Visit (INDEPENDENT_AMBULATORY_CARE_PROVIDER_SITE_OTHER): Payer: Self-pay | Admitting: Primary Care

## 2024-04-28 ENCOUNTER — Ambulatory Visit: Attending: Cardiology

## 2024-04-28 DIAGNOSIS — F418 Other specified anxiety disorders: Secondary | ICD-10-CM

## 2024-04-28 DIAGNOSIS — Z760 Encounter for issue of repeat prescription: Secondary | ICD-10-CM

## 2024-04-28 DIAGNOSIS — I639 Cerebral infarction, unspecified: Secondary | ICD-10-CM

## 2024-04-28 NOTE — Telephone Encounter (Signed)
 Copied from CRM [id number]. Topic: Clinical - Red Word Triage >> Apr 28, 2024 11:30 AM Terri MATSU wrote: Red Word that prompted transfer to Nurse Triage: Casey Warren his home health person is calling for patients his blood pressure reading 176/114

## 2024-04-29 NOTE — Telephone Encounter (Signed)
Pt has a new pcp

## 2024-04-30 NOTE — Telephone Encounter (Signed)
 Spoke with  Casey Warren  patient daughters patient in now with [facility].

## 2024-05-03 ENCOUNTER — Ambulatory Visit: Payer: Self-pay | Admitting: Cardiology

## 2024-05-03 DIAGNOSIS — I639 Cerebral infarction, unspecified: Secondary | ICD-10-CM | POA: Diagnosis not present

## 2024-05-05 NOTE — Progress Notes (Signed)
 The patient, daughter answered and has been notified of the result and verbalized understanding. All questions (if any) were answered.

## 2024-06-10 ENCOUNTER — Other Ambulatory Visit (INDEPENDENT_AMBULATORY_CARE_PROVIDER_SITE_OTHER): Payer: Self-pay | Admitting: Primary Care

## 2024-06-10 DIAGNOSIS — Z760 Encounter for issue of repeat prescription: Secondary | ICD-10-CM

## 2024-06-10 DIAGNOSIS — E782 Mixed hyperlipidemia: Secondary | ICD-10-CM

## 2024-06-11 NOTE — Telephone Encounter (Signed)
 Requested Prescriptions  Pending Prescriptions Disp Refills   atorvastatin  (LIPITOR) 20 MG tablet [Pharmacy Med Name: ATORVASTATIN  20 MG TABLET] 90 tablet 1    Sig: TAKE 1 TABLET BY MOUTH EVERY DAY     Cardiovascular:  Antilipid - Statins Failed - 06/11/2024 11:43 AM      Failed - Lipid Panel in normal range within the last 12 months    Cholesterol, Total  Date Value Ref Range Status  07/29/2023 131 100 - 199 mg/dL Final   Cholesterol  Date Value Ref Range Status  03/08/2024 121 0 - 200 mg/dL Final   LDL Chol Calc (NIH)  Date Value Ref Range Status  07/29/2023 75 0 - 99 mg/dL Final   LDL Cholesterol  Date Value Ref Range Status  03/08/2024 78 0 - 99 mg/dL Final    Comment:           Total Cholesterol/HDL:CHD Risk Coronary Heart Disease Risk Table                     Men   Women  1/2 Average Risk   3.4   3.3  Average Risk       5.0   4.4  2 X Average Risk   9.6   7.1  3 X Average Risk  23.4   11.0        Use the calculated Patient Ratio above and the CHD Risk Table to determine the patient's CHD Risk.        ATP III CLASSIFICATION (LDL):  <100     mg/dL   Optimal  899-870  mg/dL   Near or Above                    Optimal  130-159  mg/dL   Borderline  839-810  mg/dL   High  >809     mg/dL   Very High Performed at [facility], [address], [place], [place] [address]    HDL  Date Value Ref Range Status  03/08/2024 24 (L) >40 mg/dL Final  97/81/7974 38 (L) >39 mg/dL Final   Triglycerides  Date Value Ref Range Status  03/08/2024 96 <150 mg/dL Final         Passed - Patient is not pregnant      Passed - Valid encounter within last 12 months    Recent Outpatient Visits           10 months ago Essential hypertension   [place] [facility] Celestia Rosaline SQUIBB, NP   1 year ago Hospital discharge follow-up   [place] [facility] Celestia Rosaline SQUIBB, NP   1 year ago Medication refill   [place] [facility] Celestia Rosaline SQUIBB, NP   1 year ago Essential hypertension   [place] [facility] Celestia Rosaline SQUIBB, NP   1 year ago Essential hypertension   [place] [facility] Celestia Rosaline SQUIBB, NP       Future Appointments             In 1 week Lavona Agent, MD [facility] at [facility] of [employer] [facility] [employer]   In 3 months Thapa, Sudan, MD [facility] Endocrinology

## 2024-06-16 ENCOUNTER — Inpatient Hospital Stay

## 2024-06-16 ENCOUNTER — Inpatient Hospital Stay: Attending: Nurse Practitioner | Admitting: Nurse Practitioner

## 2024-06-16 DIAGNOSIS — D649 Anemia, unspecified: Secondary | ICD-10-CM | POA: Insufficient documentation

## 2024-06-16 DIAGNOSIS — Z87891 Personal history of nicotine dependence: Secondary | ICD-10-CM | POA: Insufficient documentation

## 2024-06-16 DIAGNOSIS — Z803 Family history of malignant neoplasm of breast: Secondary | ICD-10-CM | POA: Insufficient documentation

## 2024-06-16 DIAGNOSIS — Z806 Family history of leukemia: Secondary | ICD-10-CM | POA: Insufficient documentation

## 2024-06-18 NOTE — Progress Notes (Unsigned)
 " OUTPATIENT PHYSICAL THERAPY CERVICAL EVALUATION   Patient Name: Casey Warren MRN: [id number] DOB:July 11, 1960, 64 y.o., male Today's Date: 06/18/2024  END OF SESSION:   Past Medical History:  Diagnosis Date   Angio-edema    Anxiety    related to surgery   Asthma    GERD (gastroesophageal reflux disease)    History of blood transfusion    HTN (hypertension)    Seizures (HCC)    last seizure in 2023 per patient   Ulcer    Past Surgical History:  Procedure Laterality Date   CLOSED REDUCTION NASAL FRACTURE  11/19/2011   Procedure: CLOSED REDUCTION NASAL FRACTURE;  Surgeon: Marlyce Finer, MD;  Location: [facility] OR;  Service: ENT;  Laterality: N/A;  CLOSED REDUCTION NASAL AND NASAL SEPTAL FRACTURE WITH STABILIZATION   No surgical     Patient Active Problem List   Diagnosis Date Noted   Protein-calorie malnutrition, severe 03/08/2024   Cerebrovascular accident (CVA) (HCC) 03/08/2024   Malnutrition of moderate degree 03/07/2024   Graves disease 03/04/2024   Hypotension 03/02/2024   Low back pain 05/20/2023   Angioedema 07/01/2022   Anxiety 07/01/2022   Hyponatremia 11/30/2019   AKI (acute kidney injury) 11/30/2019   Hypotension due to medication 11/30/2019   Seizures (HCC) 11/30/2019   Essential hypertension 04/06/2017   ANEMIA 04/28/2008   Alcohol abuse 04/28/2008   TOBACCO ABUSE 04/28/2008   SUBSTANCE ABUSE, MULTIPLE 04/28/2008   Dental caries 04/28/2008   GERD 04/28/2008   PEPTIC ULCER DISEASE, HELICOBACTER PYLORI POSITIVE 04/28/2008    PCP: Rosalea Rosina SAILOR, PA   REFERRING PROVIDER: Irving Delinda ORN, MD  REFERRING DIAG: [phone] (ICD-10-CM) - Radiculopathy, cervical region, L adhesive capsulitis  THERAPY DIAG:  No diagnosis found.  Rationale for Evaluation and Treatment: Rehabilitation  ONSET DATE: ***  SUBJECTIVE:                                                                                                                                                                                                          SUBJECTIVE STATEMENT: *** Hand dominance: {MISC; OT HAND DOMINANCE:[phone]}  PERTINENT HISTORY:    PAIN:  Are you having pain? Yes: NPRS scale: *** Pain location: *** Pain description: *** Aggravating factors: *** Relieving factors: ***  PRECAUTIONS: None  RED FLAGS: None     WEIGHT BEARING RESTRICTIONS: No  FALLS:  Has patient fallen in last 6 months? No  OCCUPATION: ***  PLOF: Independent  PATIENT GOALS: To manage my neck and shoulder pain  NEXT MD VISIT: TBD  OBJECTIVE:  Note: Objective  measures were completed at Evaluation unless otherwise noted.  DIAGNOSTIC FINDINGS:  None noted  PATIENT SURVEYS:  NDI:  NECK DISABILITY INDEX  Date: *** Score  Pain intensity {NDI-1:32931}  2. Personal care (washing, dressing, etc.) {NDI-2:32932}  3. Lifting {NDI-3:32933}  4. Reading {NDI-4:32934}  5. Headaches {NDI-5:32935}  6. Concentration {NDI-6:32936}  7. Work {NDI-7:32937}  8. Driving {WIP-1:67061}  9. Sleeping {NDI-9:32939}  10. Recreation {NDI-10:32940}  Total ***/50   Minimum Detectable Change (90% confidence): 5 points or 10% points  POSTURE: {posture:25561}  PALPATION: ***   CERVICAL ROM:   {AROM/PROM:27142} ROM A/PROM (deg) eval  Flexion   Extension   Right lateral flexion   Left lateral flexion   Right rotation   Left rotation    (Blank rows = not tested)  UPPER EXTREMITY ROM:  {AROM/PROM:27142} ROM Right eval Left eval  Shoulder flexion    Shoulder extension    Shoulder abduction    Shoulder adduction    Shoulder extension    Shoulder internal rotation    Shoulder external rotation    Elbow flexion    Elbow extension    Wrist flexion    Wrist extension    Wrist ulnar deviation    Wrist radial deviation    Wrist pronation    Wrist supination     (Blank rows = not tested)  UPPER EXTREMITY MMT:  MMT Right eval Left eval  Shoulder flexion    Shoulder extension     Shoulder abduction    Shoulder adduction    Shoulder extension    Shoulder internal rotation    Shoulder external rotation    Middle trapezius    Lower trapezius    Elbow flexion    Elbow extension    Wrist flexion    Wrist extension    Wrist ulnar deviation    Wrist radial deviation    Wrist pronation    Wrist supination    Grip strength     (Blank rows = not tested)  CERVICAL SPECIAL TESTS:  Neck flexor muscle endurance test: {pos/neg:25243}, Upper limb tension test (ULTT): {pos/neg:25243}, and Spurling's test: {pos/neg:25243}  FUNCTIONAL TESTS:  {Functional tests:24029}  TREATMENT:         [facility] Adult PT Treatment:                                                DATE: *** Therapeutic Exercise: *** Manual Therapy: *** Neuromuscular re-ed: *** Therapeutic Activity: *** Modalities: *** Self Care: Additional minutes spent for educating on updated Therapeutic Home Exercise Program as well as comparing current status to condition at start of symptoms. This included exercises focusing on stretching, strengthening, with focus on eccentric aspects. Long term goals include an improvement in range of motion, strength, endurance as well as avoiding reinjury. Patient's frequency would include in 1-2 times a day, 3-5 times a week for a duration of 6-12 weeks. Proper technique shown and discussed handout in great detail. All questions were discussed and addressed.  PATIENT EDUCATION:  Education details: Discussed eval findings, rehab rationale and POC and patient is in agreement  Person educated: Patient Education method: Explanation and Handouts Education comprehension: verbalized understanding and needs further education  HOME EXERCISE PROGRAM: ***  ASSESSMENT:  CLINICAL IMPRESSION: Patient is a *** y.o. *** who was seen today for physical therapy  evaluation and treatment for ***.   OBJECTIVE IMPAIRMENTS: {opptimpairments:25111}.   ACTIVITY LIMITATIONS: {activitylimitations:27494}  PARTICIPATION LIMITATIONS: {participationrestrictions:25113}  PERSONAL FACTORS: {Personal factors:25162} are also affecting patient's functional outcome.   REHAB POTENTIAL: Good  CLINICAL DECISION MAKING: Evolving/moderate complexity  EVALUATION COMPLEXITY: Moderate   GOALS: Goals reviewed with patient? No  SHORT TERM GOALS: Target date: ***  Patient to demonstrate independence in HEP  Baseline:  Goal status: INITIAL  2.  *** Baseline:  Goal status: INITIAL  3.  *** Baseline:  Goal status: INITIAL  4.  *** Baseline:  Goal status: INITIAL  5.  *** Baseline:  Goal status: INITIAL  6.  *** Baseline:  Goal status: INITIAL  LONG TERM GOALS: Target date: ***  Patient will acknowledge ***/10 pain at least once during episode of care   Baseline:  Goal status: INITIAL  2.  Patient will score at least ***% on NDI to signify clinically meaningful improvement in functional abilities.   Baseline:  Goal status: INITIAL  3.  *** Baseline:  Goal status: INITIAL  4.  *** Baseline:  Goal status: INITIAL  5.  *** Baseline:  Goal status: INITIAL  6.  *** Baseline:  Goal status: INITIAL   PLAN:  PT FREQUENCY: {rehab frequency:25116}  PT DURATION: {rehab duration:25117}  PLANNED INTERVENTIONS: {rehab planned interventions:25118::97110-Therapeutic exercises,97530- Therapeutic [phone]- Neuromuscular re-education,97535- Self Rjmz,02859- Manual therapy,Patient/Family education}  PLAN FOR NEXT SESSION: ***   [name], PT 06/18/2024, 9:09 AM      "

## 2024-06-20 DIAGNOSIS — R55 Syncope and collapse: Secondary | ICD-10-CM | POA: Insufficient documentation

## 2024-06-20 NOTE — Progress Notes (Unsigned)
" °  Cardiology Office Note:   Date:  06/20/2024  ID:  Casey Warren, DOB 1960/09/14, MRN [id number] PCP: Rosalea Rosina SAILOR, PA  [facility] Providers Cardiologist:  None {  History of Present Illness:   Casey Warren is a 64 y.o. male who I saw in 2019 for evaluation of hypertension and an abnormal EKG.    He is referred for evaluation of syncope.  ***    who was referred by No ref. provider found for evaluation of HTN and an abnormal EKG. that he had a syncopal episode twice.  I do not have any details of the first episode.  However, the second episode he was in the emergency room and I reviewed these data.  He was thought to have a seizure.  He had loss of bowel and bladder.   He did have a head CT that demonstrated some old lacunar infarcts.  EKG was unremarkable.  He had no further cardiovascular testing.  However, he has had some abnormalities on his EKG with some inferolateral T wave inversions.      He returns to discuss this.  He is had no further episodes similar to that described above.  He is very active. The patient denies any new symptoms such as chest discomfort, neck or arm discomfort. There has been no new shortness of breath, PND or orthopnea. There have been no reported palpitations, presyncope or syncope.    ***    ROS: ***  Studies Reviewed:    EKG:       ***  Risk Assessment/Calculations:   {Does this patient have ATRIAL FIBRILLATION?:[phone]} No BP recorded.  {Refresh Note OR Click here to enter BP  :1}***        Physical Exam:   VS:  There were no vitals taken for this visit.   Wt Readings from Last 3 Encounters:  04/19/24 137 lb (62.1 kg)  03/16/24 156 lb 12 oz (71.1 kg)  02/16/24 150 lb (68 kg)     GEN: Well nourished, well developed in no acute distress NECK: No JVD; No carotid bruits CARDIAC: ***RR, *** murmurs, rubs, gallops RESPIRATORY:  Clear to auscultation without rales, wheezing or rhonchi  ABDOMEN: Soft, non-tender,  non-distended EXTREMITIES:  No edema; No deformity   ASSESSMENT AND PLAN:   SYNCOPE:  ***    HTN:   His blood pressure is ***   now well controlled.  This is likely the etiology of his EKG changes.  He will continue the meds as listed.    ABNORMAL EKG:  ***  Echo was normal.  Given this in the absence of any symptoms no further cardiovascular testing is suggested.    TOBACCO ABUSE:   ***   He was given instructions to call [phone]     Follow up ***  Signed, Lynwood Schilling, MD   "

## 2024-06-21 ENCOUNTER — Ambulatory Visit: Attending: Family Medicine

## 2024-06-21 ENCOUNTER — Other Ambulatory Visit: Payer: Self-pay

## 2024-06-21 DIAGNOSIS — M5412 Radiculopathy, cervical region: Secondary | ICD-10-CM | POA: Insufficient documentation

## 2024-06-21 DIAGNOSIS — M7502 Adhesive capsulitis of left shoulder: Secondary | ICD-10-CM | POA: Diagnosis present

## 2024-06-21 DIAGNOSIS — R293 Abnormal posture: Secondary | ICD-10-CM | POA: Insufficient documentation

## 2024-06-22 ENCOUNTER — Encounter: Payer: Self-pay | Admitting: Cardiology

## 2024-06-22 ENCOUNTER — Ambulatory Visit: Attending: Cardiology | Admitting: Cardiology

## 2024-06-22 VITALS — BP 99/71 | HR 127

## 2024-06-22 DIAGNOSIS — I10 Essential (primary) hypertension: Secondary | ICD-10-CM

## 2024-06-22 DIAGNOSIS — R55 Syncope and collapse: Secondary | ICD-10-CM | POA: Diagnosis not present

## 2024-06-22 DIAGNOSIS — R9431 Abnormal electrocardiogram [ECG] [EKG]: Secondary | ICD-10-CM

## 2024-06-22 NOTE — Patient Instructions (Signed)
 Medication Instructions:  No Changes  Lab Work: None  Follow-Up: At [facility], you and your health needs are our priority.  As part of our continuing mission to provide you with exceptional heart care, our providers are all part of one team.  This team includes your primary Cardiologist (physician) and Advanced Practice Providers or APPs (Physician Assistants and Nurse Practitioners) who all work together to provide you with the care you need, when you need it.  Your next appointment:    AS NEEDED  Provider:   Lynwood Schilling, MD

## 2024-06-23 ENCOUNTER — Other Ambulatory Visit

## 2024-06-23 ENCOUNTER — Ambulatory Visit: Admitting: Endocrinology

## 2024-06-23 ENCOUNTER — Encounter: Payer: Self-pay | Admitting: Endocrinology

## 2024-06-23 VITALS — BP 108/78 | HR 122 | Resp 16 | Ht 64.0 in | Wt 148.0 lb

## 2024-06-23 DIAGNOSIS — R7401 Elevation of levels of liver transaminase levels: Secondary | ICD-10-CM

## 2024-06-23 DIAGNOSIS — R000 Tachycardia, unspecified: Secondary | ICD-10-CM | POA: Diagnosis not present

## 2024-06-23 DIAGNOSIS — E0500 Thyrotoxicosis with diffuse goiter without thyrotoxic crisis or storm: Secondary | ICD-10-CM | POA: Diagnosis not present

## 2024-06-23 MED ORDER — ATENOLOL 25 MG PO TABS: 25.0000 mg | ORAL_TABLET | Freq: Every day | ORAL | 3 refills | Status: AC

## 2024-06-23 NOTE — Progress Notes (Signed)
 "  Outpatient Endocrinology Note Kysa Calais, MD   Patient's Name: Casey Warren    DOB: 02-23-1961    MRN: [id number]  REASON OF VISIT: New consult for hyperthyroidism / Graves disease  REFERRING PROVIDER:  Tobie Yetta HERO, MD   PCP: Rosalea Rosina SAILOR, PA  HISTORY OF PRESENT ILLNESS:   Casey Warren is a 64 y.o. old male with past medical history as listed below is presented for new consult for hyperthyroidism / Graves disease.  Patient is accompanied by wife in the clinic today.  Pertinent Thyroid  History: Patient is referred to endocrinology for evaluation and management of Graves' disease/hyperthyroidism, initial consult on June 24, 2023.  Patient was hospitalized in September 2025, he had alcohol withdrawal, history of alcohol abuse and noted to have hyperthyroid on laboratory test with clinical features of tachycardia.  September patient had suppressed TSH with elevated free T3 of 9.8 and elevated free T4 of 3.25.  He had elevated TRAb and TSI consistent with having Graves' disease causing hyperthyroidism.   During the hospitalization in September 2025 patient was treated with propylthiouracil  and was discharged on methimazole  20 mg daily.  Patient continued to have tachycardia when was seen by cardiology on January 13 and requested early endocrinology evaluation.    With a chart review patient was discharged from hospital on October 7 and was prescribed with methimazole  10, to be taken 2 tablets daily, 30 tablets for 15-day supply and atenolol  12.5 mg for 15-day supply.  Patient and his wife in the clinic are not able to provide what other medications he has been taking.  He had received 15-day supplies of methimazole  and atenolol  in October.  He has likely not been taking this medications for last several weeks.  Complains of occasional palpitation.  Muscle weakness.  Fatigue and gradual weight loss.  Denies family history of thyroid  disorder.  No use of amiodarone.  No  neck discomfort or any compressive symptoms.  No IV iodine contrast recently.  Patient had CT scan with IV iodine in September however had elevated thyroid  hormone levels prior to that CT.  Ultrasound thyroid  in March 03, 2024 when hospitalized moderately heterogenous thyroid  gland with left hyperechoic nodule/pseudonodule measuring 0.9 cm.  # Transaminitis : - Patient had elevated liver enzymes , AST and ALT when hospitalized in September - October of 2025.  He has history of alcohol abuse.  Patient reports he quit alcohol after the hospital discharge in October 2025.  Labs:   Latest Reference Range & Units 03/03/24 16:03  Thyrotropin Receptor Ab 0.00 - 1.75 IU/L 8.45 (H)  Thyroid  Stimulating Immunoglob 0.00 - 0.55 IU/L 5.08 (H)  (H): Data is abnormally high   Latest Reference Range & Units 03/02/24 21:08 03/03/24 04:51 03/03/24 14:30  TSH 0.350 - 4.500 uIU/mL <0.100 (L)    Triiodothyronine,Free,Serum 2.0 - 4.4 pg/mL   9.8 (H)  T4,Free(Direct) 0.61 - 1.12 ng/dL  6.74 (H)   (L): Data is abnormally low (H): Data is abnormally high  Interval history  Patient was diagnosed with hyperthyroidism secondary to Graves' disease in September 2025.  He was treated with propylthiouracil  while hospitalized and discharged on methimazole  and atenolol .  At this time he has not been taking methimazole  and atenolol  for several weeks.  He was provided with 15-day supplies of these medication with no refills.  Heart rate today 122.  REVIEW OF SYSTEMS:  As per history of present illness.   PAST MEDICAL HISTORY: Past Medical History:  Diagnosis Date  Angio-edema    Anxiety    related to surgery   Asthma    GERD (gastroesophageal reflux disease)    History of blood transfusion    HTN (hypertension)    Seizures (HCC)    last seizure in 2023 per patient   Ulcer     PAST SURGICAL HISTORY: Past Surgical History:  Procedure Laterality Date   CLOSED REDUCTION NASAL FRACTURE  11/19/2011    Procedure: CLOSED REDUCTION NASAL FRACTURE;  Surgeon: Marlyce Finer, MD;  Location: [facility] OR;  Service: ENT;  Laterality: N/A;  CLOSED REDUCTION NASAL AND NASAL SEPTAL FRACTURE WITH STABILIZATION   No surgical      ALLERGIES: Allergies[1]  FAMILY HISTORY:  Family History  Problem Relation Age of Onset   Stroke Mother        Died age 57   Hypertension Mother    Diabetes Mother    Breast cancer Mother    Leukemia Father        Died age 16   Allergic rhinitis Sister    Asthma Grandson    Colon cancer Neg Hx     SOCIAL HISTORY: Social History   Socioeconomic History   Marital status: Married    Spouse name: Not on file   Number of children: 3   Years of education: Not on file   Highest education level: Not on file  Occupational History   Not on file  Tobacco Use   Smoking status: Former    Types: Cigars, Cigarettes    Passive exposure: Never   Smokeless tobacco: Never   Tobacco comments:    2 -3 black-n-milds a day   Vaping Use   Vaping status: Never Used  Substance and Sexual Activity   Alcohol use: Not Currently   Drug use: No   Sexual activity: Not on file  Other Topics Concern   Not on file  Social History Narrative   Lives in 1 story home   12 grade education   Works as tour manager   Social Drivers of Health   Tobacco Use: Medium Risk (06/23/2024)   Patient History    Smoking Tobacco Use: Former    Smokeless Tobacco Use: Never    Passive Exposure: Never  Programmer, Applications: Not on file  Food Insecurity: No Food Insecurity (03/17/2024)   Epic    Worried About Programme Researcher, Broadcasting/film/video in the Last Year: Never true    Ran Out of Food in the Last Year: Never true  Transportation Needs: No Transportation Needs (03/17/2024)   Epic    Lack of Transportation (Medical): No    Lack of Transportation (Non-Medical): No  Physical Activity: Not on file  Stress: Not on file  Social Connections: Moderately Integrated (03/03/2024)   Social  Connection and Isolation Panel    Frequency of Communication with Friends and Family: Three times a week    Frequency of Social Gatherings with Friends and Family: Three times a week    Attends Religious Services: 1 to 4 times per year    Active Member of Clubs or Organizations: No    Attends Banker Meetings: Never    Marital Status: Married  Depression (PHQ2-9): Low Risk (07/29/2023)   Depression (PHQ2-9)    PHQ-2 Score: 0  Alcohol Screen: Not on file  Housing: Unknown (03/17/2024)   Epic    Unable to Pay for Housing in the Last Year: No    Number of Times Moved in the Last Year: Not on file  Homeless in the Last Year: No  Utilities: Not At Risk (03/17/2024)   Epic    Threatened with loss of utilities: No  Health Literacy: Not on file    MEDICATIONS:  Current Outpatient Medications  Medication Sig Dispense Refill   albuterol  (VENTOLIN  HFA) 108 (90 Base) MCG/ACT inhaler Inhale 2 puffs into the lungs every 6 (six) hours as needed for wheezing or shortness of breath. 18 g 2   aspirin  EC 81 MG tablet Take 81 mg by mouth daily.     atenolol  (TENORMIN ) 25 MG tablet Take 1 tablet (25 mg total) by mouth daily. 90 tablet 3   atorvastatin  (LIPITOR) 20 MG tablet TAKE 1 TABLET BY MOUTH EVERY DAY 90 tablet 1   clopidogrel  (PLAVIX ) 75 MG tablet Take 1 tablet (75 mg total) by mouth daily. 30 tablet 0   cyclobenzaprine  (FLEXERIL ) 5 MG tablet Take 1 tablet (5 mg total) by mouth 3 (three) times daily as needed for muscle spasms. 30 tablet 0   EPINEPHrine  0.3 mg/0.3 mL IJ SOAJ injection Inject 0.3 mg into the muscle as needed for anaphylaxis. 1 each 2   escitalopram  (LEXAPRO ) 20 MG tablet Take 1 tablet (20 mg total) by mouth daily. Needs office visit for additional refills. 30 tablet 0   feeding supplement (ENSURE PLUS HIGH PROTEIN) LIQD Take 237 mLs by mouth 2 (two) times daily between meals. (Patient not taking: Reported on 06/22/2024) 10000 mL 0   ferrous sulfate 325 (65 FE) MG EC  tablet Take 1 tablet by mouth daily.     folic acid  (FOLVITE ) 1 MG tablet Take 1 tablet (1 mg total) by mouth daily. (Patient not taking: Reported on 06/22/2024) 30 tablet 0   loperamide  (IMODIUM ) 2 MG capsule Take 1 capsule (2 mg total) by mouth as needed for diarrhea or loose stools. 30 capsule 0   loratadine  (CLARITIN ) 10 MG tablet TAKE 1 TABLET BY MOUTH EVERY DAY AS NEEDED FOR ALLERGY 90 tablet 1   magnesium  oxide (MAG-OX) 400 MG tablet Take 1 tablet (400 mg total) by mouth 2 (two) times daily. (Patient not taking: Reported on 06/22/2024) 60 tablet 0   methimazole  (TAPAZOLE ) 10 MG tablet Take 2 tablets (20 mg total) by mouth daily. 30 tablet 0   Multiple Vitamin (MULTIVITAMIN WITH MINERALS) TABS tablet Take 1 tablet by mouth daily. 30 tablet 0   nicotine  (NICODERM CQ  - DOSED IN MG/24 HOURS) 21 mg/24hr patch Place 1 patch (21 mg total) onto the skin daily. (Patient not taking: Reported on 06/22/2024) 28 patch 0   sodium bicarbonate  650 MG tablet Take 1 tablet (650 mg total) by mouth 2 (two) times daily. (Patient not taking: Reported on 06/22/2024) 60 tablet 0   thiamine  (VITAMIN B-1) 100 MG tablet Take 2 tablets (200 mg total) by mouth daily. (Patient not taking: Reported on 06/22/2024) 30 tablet 0   topiramate  (TOPAMAX ) 100 MG tablet Take 100 mg by mouth 2 (two) times daily.     No current facility-administered medications for this visit.    PHYSICAL EXAM: Vitals:   06/23/24 1119  BP: 108/78  Pulse: (!) 122  Resp: 16  SpO2: 96%  Weight: 148 lb (67.1 kg)  Height: 5' 4 (1.626 m)   Body mass index is 25.4 kg/m.  Wt Readings from Last 3 Encounters:  06/23/24 148 lb (67.1 kg)  04/19/24 137 lb (62.1 kg)  03/16/24 156 lb 12 oz (71.1 kg)    General: Well developed, well nourished male in no apparent distress.  HEENT: AT/[place], no external lesions. Hearing intact to the spoken word Eyes: EOMI. No stare, proptosis or lid lag. Conjunctiva clear and no icterus. No erythema or watering Neck:  Trachea midline, neck supple with mild appreciable thyromegaly or  no lymphadenopathy and no palpable thyroid  nodules Lungs: Clear to auscultation, no wheeze. Respirations not labored Heart: S1S2, Regular rhythm. Tachycardia + Abdomen: Soft, non tender, non distended Neurologic: Alert, oriented, normal speech, deep tendon biceps reflexes normal,  no gross focal neurological deficit Extremities: No pedal pitting edema, no tremors of outstretched hands Skin: Warm, color good.  Psychiatric: Does not appear depressed or anxious  PERTINENT HISTORIC LABORATORY AND IMAGING STUDIES:  All pertinent laboratory results were reviewed. Please see HPI also for further details.   TSH  Date Value Ref Range Status  03/02/2024 <0.100 (L) 0.350 - 4.500 uIU/mL Final    Comment:    Performed by a 3rd Generation assay with a functional sensitivity of <=0.01 uIU/mL. Performed at [facility], [address], [place], [place] [address]   02/05/2017 2.200 0.450 - 4.500 uIU/mL Final    Lab Results  Component Value Date   FREET4 3.25 (H) 03/03/2024   T3FREE 9.8 (H) 03/03/2024   TSH <0.100 (L) 03/02/2024   TSH 2.200 02/05/2017    Lab Results  Component Value Date   THYROTRECAB 8.45 (H) 03/03/2024    Lab Results  Component Value Date   TSH <0.100 (L) 03/02/2024   TSH 2.200 02/05/2017   FREET4 3.25 (H) 03/03/2024     Lab Results  Component Value Date   TSI 5.08 (H) 03/03/2024     No components found for: TRAB    ASSESSMENT / PLAN  1. Graves disease   2. Transaminitis   3. Tachycardia    Patient was diagnosed with hyperthyroidism secondary to Graves' disease in September 2025, elevated TSI and TRAb.  Patient was treated with propylthiouracil  while hospitalized and was discharged on methimazole  20 mg daily with 15-day supply with no refill.  He has not been taking antithyroid medication/methimazole  for several weeks.  He was also provided with atenolol  15 days supply and has not been  taking beta-blocker.  Patient is clinically hyperthyroid and tachycardic with heart rate 122 today in the clinic.  While hospitalized in September - October of 2025 patient had transaminitis.  Patient has a history of alcohol abuse.  Patient reports he quit after the discharge from hospital.  Congratulated him.  Encouraged not to restart drinking alcohol.  Plan: - Start atenolol  25 mg daily for symptomatic treatment and tachycardia. - I would like to check thyroid  function test TSH, free T4, free T3. -He also had transaminitis in October, I would like to recheck liver enzymes / CMP, need to be cautious with transaminitis to resume antithyroid medication.  If he continues to have transaminitis will refer to gastroenterology. - He needs antithyroid medication, we will restart methimazole  after the test results from today. -He needs close endocrinology follow-up.   The three options of therapy for hyperthyroidism were discussed with the patient, including thionamide drug therapy, thyroidectomy, and radioactive iodine ablation. I reviewed the possible complications of rash, agranulocytosis, or liver dysfunction associated with anti-thyroid  therapies. I reviewed the risks of hemorrhage, hypocalcemia, hoarseness and hypothyroidism after thyroidectomy. Regarding radioactive iodine ablation, I informed the patient that most patients treated with radioactive iodine can be cured of hyperthyroidism with a single dose, but to about 15-20% may require an additional dose. I emphasized that most patients treated with  radioactive iodine develop permanent hypothyroidism, requiring lifelong replacement with thyroid  hormone. I discussed the rare occurrence of transient increase in thyroid  hormone levels after radioactive iodine and associated with symptoms, possible worsening of Grave's eye disease, and the likely small but not insignificant risks associated with radiation exposure of this kind.   Will treat with  antithyroid medication at this time.  Diagnoses and all orders for this visit:  Graves disease -     TSH -     T3, free -     T4, free -     atenolol  (TENORMIN ) 25 MG tablet; Take 1 tablet (25 mg total) by mouth daily.  Transaminitis -     Comprehensive metabolic panel with GFR  Tachycardia -     atenolol  (TENORMIN ) 25 MG tablet; Take 1 tablet (25 mg total) by mouth daily.    DISPOSITION Follow up in clinic in 1 month suggested.  All questions answered and patient verbalized understanding of the plan.  Casey Reading, MD [facility] [address] [place], [address] [place], [place] [address] Phone # [phone]   At least part of this note was generated using voice recognition software. Inadvertent word errors may have occurred, which were not recognized during the proofreading process.     [1]  Allergies Allergen Reactions   Banana Anaphylaxis    Per family   Citrus Anaphylaxis    Per daughter only allergic to citrus but can have apples, pineapple, etc   Chlorthalidone  Other (See Comments)    Hyponatremia   Penicillins    "

## 2024-06-23 NOTE — Patient Instructions (Signed)
 Start Atenolol  25mg  daily for heart rate control.  Lab for thyroid  today and will send medication for thyroid  tomorrow.

## 2024-06-24 ENCOUNTER — Ambulatory Visit: Payer: Self-pay | Admitting: Endocrinology

## 2024-06-24 DIAGNOSIS — E0500 Thyrotoxicosis with diffuse goiter without thyrotoxic crisis or storm: Secondary | ICD-10-CM

## 2024-06-24 DIAGNOSIS — E876 Hypokalemia: Secondary | ICD-10-CM

## 2024-06-25 ENCOUNTER — Other Ambulatory Visit: Payer: Self-pay

## 2024-06-25 ENCOUNTER — Telehealth: Payer: Self-pay

## 2024-06-25 LAB — COMPREHENSIVE METABOLIC PANEL WITH GFR
AG Ratio: 1.6 (calc) (ref 1.0–2.5)
ALT: 29 U/L (ref 9–46)
AST: 16 U/L (ref 10–35)
Albumin: 4.5 g/dL (ref 3.6–5.1)
Alkaline phosphatase (APISO): 117 U/L (ref 35–144)
BUN: 24 mg/dL (ref 7–25)
CO2: 29 mmol/L (ref 20–32)
Calcium: 10 mg/dL (ref 8.6–10.3)
Chloride: 93 mmol/L — ABNORMAL LOW (ref 98–110)
Creat: 1 mg/dL (ref 0.70–1.35)
Globulin: 2.8 g/dL (ref 1.9–3.7)
Glucose, Bld: 240 mg/dL — ABNORMAL HIGH (ref 65–99)
Potassium: 2.8 mmol/L — ABNORMAL LOW (ref 3.5–5.3)
Sodium: 135 mmol/L (ref 135–146)
Total Bilirubin: 0.6 mg/dL (ref 0.2–1.2)
Total Protein: 7.3 g/dL (ref 6.1–8.1)
eGFR: 84 mL/min/1.73m2

## 2024-06-25 LAB — T3, FREE: T3, Free: 17.9 pg/mL — ABNORMAL HIGH (ref 2.3–4.2)

## 2024-06-25 LAB — TSH: TSH: <0.01 m[IU]/L — ABNORMAL LOW (ref 0.40–4.50)

## 2024-06-25 LAB — T4, FREE: Free T4: 4.9 ng/dL — ABNORMAL HIGH (ref 0.8–1.8)

## 2024-06-25 MED ORDER — METHIMAZOLE 10 MG PO TABS: 30.0000 mg | ORAL_TABLET | Freq: Every day | ORAL | 3 refills | Status: AC

## 2024-06-25 NOTE — Telephone Encounter (Signed)
 Spouse called stating her husband needs refill on his thyroid  medication but MD wanted to wait until results complete.

## 2024-06-25 NOTE — Telephone Encounter (Signed)
-----   Message from Lynwood Schilling, MD sent at 06/24/2024  8:18 PM EST ----- Can you call him and tell him to stop the ASA.  Thanks.

## 2024-06-25 NOTE — Telephone Encounter (Signed)
 Spoke with pt's wife per DPR and let her know that the pt can stop his Aspirin . Wife verbalized understanding. All questions if any were answered. Aspirin  removed from med list.

## 2024-07-01 ENCOUNTER — Ambulatory Visit

## 2024-07-01 DIAGNOSIS — R293 Abnormal posture: Secondary | ICD-10-CM

## 2024-07-01 DIAGNOSIS — M5412 Radiculopathy, cervical region: Secondary | ICD-10-CM | POA: Diagnosis not present

## 2024-07-01 DIAGNOSIS — M7502 Adhesive capsulitis of left shoulder: Secondary | ICD-10-CM

## 2024-07-01 NOTE — Progress Notes (Signed)
 " OUTPATIENT PHYSICAL THERAPY TREATMENT NOTE    Patient Name: Casey Warren MRN: [id number] DOB:[birth year], 64 y.o., male Today's Date: 07/01/2024  END OF SESSION:  PT End of Session - 07/01/24 1207     Visit Number 2    Number of Visits 12    Date for Recertification  08/19/24    Authorization Type [employer]    Authorization Time Period no auth required, medical necessity    PT Start Time 1210    PT Stop Time 1248    PT Time Calculation (min) 38 min    Activity Tolerance Patient tolerated treatment well;Patient limited by pain    Behavior During Therapy WFL for tasks assessed/performed         Past Medical History:  Diagnosis Date   Angio-edema    Anxiety    related to surgery   Asthma    GERD (gastroesophageal reflux disease)    History of blood transfusion    HTN (hypertension)    Seizures (HCC)    last seizure in 2023 per patient   Ulcer    Past Surgical History:  Procedure Laterality Date   CLOSED REDUCTION NASAL FRACTURE  11/19/2011   Procedure: CLOSED REDUCTION NASAL FRACTURE;  Surgeon: Marlyce Finer, MD;  Location: [facility] OR;  Service: ENT;  Laterality: N/A;  CLOSED REDUCTION NASAL AND NASAL SEPTAL FRACTURE WITH STABILIZATION   No surgical     Patient Active Problem List   Diagnosis Date Noted   Syncope and collapse 06/20/2024   Protein-calorie malnutrition, severe 03/08/2024   Cerebrovascular accident (CVA) (HCC) 03/08/2024   Malnutrition of moderate degree 03/07/2024   Graves disease 03/04/2024   Hypotension 03/02/2024   Low back pain 05/20/2023   Angioedema 07/01/2022   Anxiety 07/01/2022   Hyponatremia 11/30/2019   AKI (acute kidney injury) 11/30/2019   Hypotension due to medication 11/30/2019   Seizures (HCC) 11/30/2019   Essential hypertension 04/06/2017   ANEMIA 04/28/2008   Alcohol abuse 04/28/2008   TOBACCO ABUSE 04/28/2008   SUBSTANCE ABUSE, MULTIPLE 04/28/2008   Dental caries 04/28/2008   GERD 04/28/2008   PEPTIC ULCER DISEASE,  HELICOBACTER PYLORI POSITIVE 04/28/2008    PCP: Rosalea Rosina SAILOR, PA   REFERRING PROVIDER: Irving Delinda ORN, MD  REFERRING DIAG: [phone] (ICD-10-CM) - Radiculopathy, cervical region, L adhesive capsulitis  THERAPY DIAG:  Radiculopathy, cervical region  Adhesive capsulitis of left shoulder  Abnormal posture  Rationale for Evaluation and Treatment: Rehabilitation  ONSET DATE: 03/29/24  SUBJECTIVE:  SUBJECTIVE STATEMENT: Patient reports that both shoulders are hurting today, has done some of his HEP.  EVAL: Patient arrives to OPPT with c/o L shoulder pain off/on over 1 year Hand dominance: Right  PERTINENT HISTORY:    PAIN:  Are you having pain? Yes: NPRS scale: 9/10 Pain location: L shoulder and neck Pain description: ache, sore Aggravating factors: activity and using L arm Relieving factors: medicine  PRECAUTIONS: None  RED FLAGS: None     WEIGHT BEARING RESTRICTIONS: No  FALLS:  Has patient fallen in last 6 months? No  OCCUPATION: not working  PLOF: Independent  PATIENT GOALS: To manage my neck and shoulder pain  NEXT MD VISIT: TBD  OBJECTIVE:  Note: Objective measures were completed at Evaluation unless otherwise noted.  DIAGNOSTIC FINDINGS:  None noted  PATIENT SURVEYS:  NDI: 35/50  Minimum Detectable Change (90% confidence): 5 points or 10% points  POSTURE: rounded shoulders and forward head  PALPATION: deferred   CERVICAL ROM:   Active ROM A/PROM (deg) eval  Flexion 75%  Extension 50%  Right lateral flexion 90%  Left lateral flexion 100%  Right rotation 66%  Left rotation 66%   (Blank rows = not tested)  UPPER EXTREMITY ROM:  A/PROM Right eval Left eval  Shoulder flexion 100d 100/110d  Shoulder extension    Shoulder  abduction 160d 60/90d  Shoulder adduction    Shoulder extension    Shoulder internal rotation  L SI joint/70d  Shoulder external rotation  /40d  Elbow flexion    Elbow extension    Wrist flexion    Wrist extension    Wrist ulnar deviation    Wrist radial deviation    Wrist pronation    Wrist supination     (Blank rows = not tested)  UPPER EXTREMITY MMT:  MMT Right eval Left eval  Shoulder flexion  3  Shoulder extension    Shoulder abduction  3  Shoulder adduction    Shoulder extension    Shoulder internal rotation  3  Shoulder external rotation  3  Middle trapezius    Lower trapezius    Elbow flexion    Elbow extension    Wrist flexion    Wrist extension    Wrist ulnar deviation    Wrist radial deviation    Wrist pronation    Wrist supination    Grip strength     (Blank rows = not tested)  CERVICAL SPECIAL TESTS:  Neck flexor muscle endurance test: UTA, Upper limb tension test (ULTT): UTA, and Spurling's test: Negative  FUNCTIONAL TESTS:  deferred  TREATMENT:       [facility] Adult PT Treatment:                                                DATE: 07/01/24 Therapeutic Exercise: Pulleys flex/scap x2' ea Supine shoulder flexion with dowel x1 (sharp pain in left elbow) Supine chest press with dowel 2x10 Supine cervical AROM rotation, flex/ext x10 ea Supine ER AAROM with dowel 2x10 Seated upper trap stretch 2x30 BIL Neuromuscular re-ed: Seated scapular retraction x10 Seated shoulder shrugs x10     [facility] Adult PT Treatment:  DATE: 06/21/24 Eval and HEP Self Care: Additional minutes spent for educating on updated Therapeutic Home Exercise Program as well as comparing current status to condition at start of symptoms. This included exercises focusing on stretching, strengthening, with focus on eccentric aspects. Long term goals include an improvement in range of motion, strength, endurance as well as avoiding reinjury.  Patient's frequency would include in 1-2 times a day, 3-5 times a week for a duration of 6-12 weeks. Proper technique shown and discussed handout in great detail. All questions were discussed and addressed.                                                                                                                            PATIENT EDUCATION:  Education details: Discussed eval findings, rehab rationale and POC and patient is in agreement  Person educated: Patient Education method: Explanation and Handouts Education comprehension: verbalized understanding and needs further education  HOME EXERCISE PROGRAM: Access Code: [email or web address] URL: [email or web address] Date: 06/21/2024 Prepared by: Reyes Kohut  Exercises - Supine Shoulder Press with Dowel  - 5 x daily - 5 x weekly - 1 sets - 10 reps - Supine Shoulder Flexion Extension AAROM with Dowel  - 5 x daily - 5 x weekly - 1 sets - 10 reps - Seated Shoulder Shrugs  - 5 x daily - 5 x weekly - 1 sets - 10 reps - Seated Scapular Retraction  - 5 x daily - 5 x weekly - 1 sets - 10 reps  ASSESSMENT:  CLINICAL IMPRESSION: Patient presents to first follow up PT session reporting continued shoulder pain and that he has done some of his HEP. Session today focused on shoulder ROM and gentle strengthening within his tolerance. He moves with slow, guarded movements during session, needing cuing throughout. He had a sharp increase in pain to his left elbow during shoulder flexion AAROM, terminated this exercise today. Patient continues to benefit from skilled PT services and should be progressed as able to improve functional independence.   EVAL: Patient is a 64 y.o. male who was seen today for physical therapy evaluation and treatment for cervical radiculopathy and L shoulder adhesive capsulitis.  Cervical ROM mildly restricted globally and essentially pain free.  L shoulder ROM and strength limited by pain and guarding, restrictions  following a capsular pattern.  All motions performed slowly and cautiously with slight processing delay noted.  Spouse assisted with assessment.  Patient is a good candidate for OPPT to address ROM, strength and function deficits from adhesive capsulitis and associated cervical issues.  OBJECTIVE IMPAIRMENTS: decreased activity tolerance, decreased coordination, decreased knowledge of condition, decreased mobility, decreased ROM, decreased strength, increased fascial restrictions, impaired perceived functional ability, impaired UE functional use, postural dysfunction, pain, and muscle guarding.   ACTIVITY LIMITATIONS: carrying, lifting, sleeping, dressing, and reach over head  PERSONAL FACTORS: Age, Behavior pattern, Fitness, Past/current experiences, Time since onset of injury/illness/exacerbation, and 1 comorbidity: CVA are also affecting patient's functional outcome.  REHAB POTENTIAL: Good  CLINICAL DECISION MAKING: Evolving/moderate complexity  EVALUATION COMPLEXITY: Moderate   GOALS: Goals reviewed with patient? No  SHORT TERM GOALS: Target date: 07/19/2024    Patient to demonstrate independence in HEP  Baseline: [id number] Goal status: INITIAL  2.  Increase AROM L shoulder flexion and abd to 160d Baseline:  A/PROM Right eval Left eval  Shoulder flexion 100d 100/110d  Shoulder extension    Shoulder abduction 160d 60/90d   Goal status: INITIAL   LONG TERM GOALS: Target date: 08/16/2024  Patient will acknowledge 4/10 pain at least once during episode of care   Baseline: 9/10 Goal status: INITIAL  2.  Patient will score at least 25/50 on NDI to signify clinically meaningful improvement in functional abilities.   Baseline: 35/50 Goal status: INITIAL  3.  4/5 L shoulder strength  Baseline: 3/5 global Goal status: INITIAL  4.  70d ER PROM Baseline: see ROM chart Goal status: INITIAL  5.  Patient to demo neutral posture Baseline: rounded shoulders and forward  head Goal status: INITIAL     PLAN:  PT FREQUENCY: 2x/week  PT DURATION: 6 weeks  PLANNED INTERVENTIONS: 97110-Therapeutic exercises, 97530- Therapeutic activity, 97112- Neuromuscular re-education, 97535- Self Care, 02859- Manual therapy, Patient/Family education, and Joint mobilization  PLAN FOR NEXT SESSION: HEP review and update, manual techniques as appropriate, aerobic tasks, ROM and flexibility activities, strengthening and PREs, TPDN, gait and balance training,aquatic therapy, modalities for pain and NMRE      Corean Pouch, PTA 07/01/2024, 12:46 PM   "

## 2024-07-02 ENCOUNTER — Ambulatory Visit

## 2024-07-02 DIAGNOSIS — M5412 Radiculopathy, cervical region: Secondary | ICD-10-CM

## 2024-07-02 DIAGNOSIS — M7502 Adhesive capsulitis of left shoulder: Secondary | ICD-10-CM

## 2024-07-02 DIAGNOSIS — R293 Abnormal posture: Secondary | ICD-10-CM

## 2024-07-02 NOTE — Progress Notes (Addendum)
 " OUTPATIENT PHYSICAL THERAPY TREATMENT NOTE    Patient Name: Casey Warren MRN: [id number] DOB:1961/05/24, 64 y.o., male Today's Date: 07/02/2024  END OF SESSION:  PT End of Session - 07/02/24 1043     Visit Number 3    Number of Visits 12    Date for Recertification  08/19/24    Authorization Type [employer]    Authorization Time Period no auth required, medical necessity    PT Start Time 1045    PT Stop Time 1123    PT Time Calculation (min) 38 min    Activity Tolerance Patient tolerated treatment well;Patient limited by pain    Behavior During Therapy WFL for tasks assessed/performed          Past Medical History:  Diagnosis Date   Angio-edema    Anxiety    related to surgery   Asthma    GERD (gastroesophageal reflux disease)    History of blood transfusion    HTN (hypertension)    Seizures (HCC)    last seizure in 2023 per patient   Ulcer    Past Surgical History:  Procedure Laterality Date   CLOSED REDUCTION NASAL FRACTURE  11/19/2011   Procedure: CLOSED REDUCTION NASAL FRACTURE;  Surgeon: Marlyce Finer, MD;  Location: [facility] OR;  Service: ENT;  Laterality: N/A;  CLOSED REDUCTION NASAL AND NASAL SEPTAL FRACTURE WITH STABILIZATION   No surgical     Patient Active Problem List   Diagnosis Date Noted   Syncope and collapse 06/20/2024   Protein-calorie malnutrition, severe 03/08/2024   Cerebrovascular accident (CVA) (HCC) 03/08/2024   Malnutrition of moderate degree 03/07/2024   Graves disease 03/04/2024   Hypotension 03/02/2024   Low back pain 05/20/2023   Angioedema 07/01/2022   Anxiety 07/01/2022   Hyponatremia 11/30/2019   AKI (acute kidney injury) 11/30/2019   Hypotension due to medication 11/30/2019   Seizures (HCC) 11/30/2019   Essential hypertension 04/06/2017   ANEMIA 04/28/2008   Alcohol abuse 04/28/2008   TOBACCO ABUSE 04/28/2008   SUBSTANCE ABUSE, MULTIPLE 04/28/2008   Dental caries 04/28/2008   GERD 04/28/2008   PEPTIC ULCER DISEASE,  HELICOBACTER PYLORI POSITIVE 04/28/2008    PCP: Rosalea Rosina SAILOR, PA   REFERRING PROVIDER: Irving Delinda ORN, MD  REFERRING DIAG: [phone] (ICD-10-CM) - Radiculopathy, cervical region, L adhesive capsulitis  THERAPY DIAG:  Radiculopathy, cervical region  Adhesive capsulitis of left shoulder  Abnormal posture  Rationale for Evaluation and Treatment: Rehabilitation  ONSET DATE: 03/29/24  SUBJECTIVE:  SUBJECTIVE STATEMENT: Patient reports that both shoulders are hurting today, has done some of his HEP.  EVAL: Patient arrives to OPPT with c/o L shoulder pain off/on over 1 year Hand dominance: Right  PERTINENT HISTORY:    PAIN:  Are you having pain? Yes: NPRS scale: 9/10 Pain location: L shoulder and neck Pain description: ache, sore Aggravating factors: activity and using L arm Relieving factors: medicine  PRECAUTIONS: None  RED FLAGS: None     WEIGHT BEARING RESTRICTIONS: No  FALLS:  Has patient fallen in last 6 months? No  OCCUPATION: not working  PLOF: Independent  PATIENT GOALS: To manage my neck and shoulder pain  NEXT MD VISIT: TBD  OBJECTIVE:  Note: Objective measures were completed at Evaluation unless otherwise noted.  DIAGNOSTIC FINDINGS:  None noted  PATIENT SURVEYS:  NDI: 35/50  Minimum Detectable Change (90% confidence): 5 points or 10% points  POSTURE: rounded shoulders and forward head  PALPATION: deferred   CERVICAL ROM:   Active ROM A/PROM (deg) eval  Flexion 75%  Extension 50%  Right lateral flexion 90%  Left lateral flexion 100%  Right rotation 66%  Left rotation 66%   (Blank rows = not tested)  UPPER EXTREMITY ROM:  A/PROM Right eval Left eval  Shoulder flexion 100d 100/110d  Shoulder extension    Shoulder  abduction 160d 60/90d  Shoulder adduction    Shoulder extension    Shoulder internal rotation  L SI joint/70d  Shoulder external rotation  /40d  Elbow flexion    Elbow extension    Wrist flexion    Wrist extension    Wrist ulnar deviation    Wrist radial deviation    Wrist pronation    Wrist supination     (Blank rows = not tested)  UPPER EXTREMITY MMT:  MMT Right eval Left eval  Shoulder flexion  3  Shoulder extension    Shoulder abduction  3  Shoulder adduction    Shoulder extension    Shoulder internal rotation  3  Shoulder external rotation  3  Middle trapezius    Lower trapezius    Elbow flexion    Elbow extension    Wrist flexion    Wrist extension    Wrist ulnar deviation    Wrist radial deviation    Wrist pronation    Wrist supination    Grip strength     (Blank rows = not tested)  CERVICAL SPECIAL TESTS:  Neck flexor muscle endurance test: UTA, Upper limb tension test (ULTT): UTA, and Spurling's test: Negative  FUNCTIONAL TESTS:  deferred  TREATMENT:       [facility] Adult PT Treatment:                                                DATE: 07/02/24 Therapeutic Exercise: Nustep L2 8 min Therapeutic Activity: PROM L shoulder PNF D1 F/E 15x Supine press 0# 15x Supine OH flexion 0# 15x  Seated hor abd YTB 15x Seated row YTB 15x Seated OH press 15x   [facility] Adult PT Treatment:                                                DATE: 07/01/24 Therapeutic Exercise: Pulleys flex/scap  x2' ea Supine shoulder flexion with dowel x1 (sharp pain in left elbow) Supine chest press with dowel 2x10 Supine cervical AROM rotation, flex/ext x10 ea Supine ER AAROM with dowel 2x10 Seated upper trap stretch 2x30 BIL Neuromuscular re-ed: Seated scapular retraction x10 Seated shoulder shrugs x10     [facility] Adult PT Treatment:                                                DATE: 06/21/24 Eval and HEP Self Care: Additional minutes spent for educating on updated Therapeutic  Home Exercise Program as well as comparing current status to condition at start of symptoms. This included exercises focusing on stretching, strengthening, with focus on eccentric aspects. Long term goals include an improvement in range of motion, strength, endurance as well as avoiding reinjury. Patient's frequency would include in 1-2 times a day, 3-5 times a week for a duration of 6-12 weeks. Proper technique shown and discussed handout in great detail. All questions were discussed and addressed.                                                                                                                            PATIENT EDUCATION:  Education details: Discussed eval findings, rehab rationale and POC and patient is in agreement  Person educated: Patient Education method: Explanation and Handouts Education comprehension: verbalized understanding and needs further education  HOME EXERCISE PROGRAM: Access Code: [email or web address] URL: [email or web address] Date: 06/21/2024 Prepared by: Reyes Kohut  Exercises - Supine Shoulder Press with Dowel  - 5 x daily - 5 x weekly - 1 sets - 10 reps - Supine Shoulder Flexion Extension AAROM with Dowel  - 5 x daily - 5 x weekly - 1 sets - 10 reps - Seated Shoulder Shrugs  - 5 x daily - 5 x weekly - 1 sets - 10 reps - Seated Scapular Retraction  - 5 x daily - 5 x weekly - 1 sets - 10 reps  ASSESSMENT:  CLINICAL IMPRESSION: Focus of today was continued ROM exercises and activities to promote L shoulder mobility.  Assisted patient with tasks today to ensure proper form and promote mobility.  Increased ROM noted but not formally assessed today.  EVAL: Patient is a 64 y.o. male who was seen today for physical therapy evaluation and treatment for cervical radiculopathy and L shoulder adhesive capsulitis.  Cervical ROM mildly restricted globally and essentially pain free.  L shoulder ROM and strength limited by pain and guarding, restrictions  following a capsular pattern.  All motions performed slowly and cautiously with slight processing delay noted.  Spouse assisted with assessment.  Patient is a good candidate for OPPT to address ROM, strength and function deficits from adhesive capsulitis and associated cervical issues.  OBJECTIVE IMPAIRMENTS: decreased activity tolerance, decreased coordination, decreased knowledge of condition, decreased mobility,  decreased ROM, decreased strength, increased fascial restrictions, impaired perceived functional ability, impaired UE functional use, postural dysfunction, pain, and muscle guarding.   ACTIVITY LIMITATIONS: carrying, lifting, sleeping, dressing, and reach over head  PERSONAL FACTORS: Age, Behavior pattern, Fitness, Past/current experiences, Time since onset of injury/illness/exacerbation, and 1 comorbidity: CVA are also affecting patient's functional outcome.   REHAB POTENTIAL: Good  CLINICAL DECISION MAKING: Evolving/moderate complexity  EVALUATION COMPLEXITY: Moderate   GOALS: Goals reviewed with patient? No  SHORT TERM GOALS: Target date: 07/19/2024    Patient to demonstrate independence in HEP  Baseline: [id number] Goal status: INITIAL  2.  Increase AROM L shoulder flexion and abd to 160d Baseline:  A/PROM Right eval Left eval  Shoulder flexion 100d 100/110d  Shoulder extension    Shoulder abduction 160d 60/90d   Goal status: INITIAL   LONG TERM GOALS: Target date: 08/16/2024  Patient will acknowledge 4/10 pain at least once during episode of care   Baseline: 9/10 Goal status: INITIAL  2.  Patient will score at least 25/50 on NDI to signify clinically meaningful improvement in functional abilities.   Baseline: 35/50 Goal status: INITIAL  3.  4/5 L shoulder strength  Baseline: 3/5 global Goal status: INITIAL  4.  70d ER PROM Baseline: see ROM chart Goal status: INITIAL  5.  Patient to demo neutral posture Baseline: rounded shoulders and forward  head Goal status: INITIAL     PLAN:  PT FREQUENCY: 2x/week  PT DURATION: 6 weeks  PLANNED INTERVENTIONS: 97110-Therapeutic exercises, 97530- Therapeutic activity, 97112- Neuromuscular re-education, 97535- Self Care, 02859- Manual therapy, Patient/Family education, and Joint mobilization  PLAN FOR NEXT SESSION: HEP review and update, manual techniques as appropriate, aerobic tasks, ROM and flexibility activities, strengthening and PREs, TPDN, gait and balance training,aquatic therapy, modalities for pain and NMRE      [name], PT 07/02/2024, 11:31 AM   "

## 2024-07-05 ENCOUNTER — Ambulatory Visit

## 2024-07-06 ENCOUNTER — Ambulatory Visit: Admitting: Endocrinology

## 2024-07-06 NOTE — Therapy (Unsigned)
 [facility] Outpatient Orthopedic Rehabilitation at [facility] [address] [place], [place], [address] Phone: [phone]   Fax:  [phone]  Patient Details  Name: Casey Warren MRN: [id number] Date of Birth: 12-14-1960 Referring Provider:  Rosalea Rosina SAILOR, [place]  Encounter Date: 07/07/2024   [name], PT 07/06/2024, 1:02 PM  [facility] Outpatient Orthopedic Rehabilitation at [facility] [address] [place], [place], [address] Phone: [phone]   Fax:  [phone]

## 2024-07-07 ENCOUNTER — Telehealth: Payer: Self-pay

## 2024-07-07 ENCOUNTER — Ambulatory Visit

## 2024-07-07 NOTE — Telephone Encounter (Signed)
 TC due to missed visit.  Spoke to patient's wife who had appointment date confused.  Reminded of next scheduled visit.

## 2024-07-07 NOTE — Progress Notes (Unsigned)
 Casey Warren  Patient Care Team: Casey Warren, [place] as PCP - General (Physician Assistant) Casey Agent, MD as PCP - Cardiology (Cardiology)  ASSESSMENT & PLAN:  [name] is a 64 y.o. male with history of HTN, seizure, GERD, Grave disease being seen for iron deficiency anemia.  Patient is on plavix  and taking ferrous sulfate. Last iron storage was fine and Hgb appeared improved by end of Oct. More recent Cr and LFT were normal in Jan 2026.  Wife concerns about possible need for b vitamin supplement. Discussed with obtain lab today. Phone visit in 3 weeks. If needed will obtain further testing.  Assessment & Plan Normocytic anemia Report history of IDA on iron Repeat lab today Also obtain B12, MMA and folate Follow up in 3 weeks with phone visit  Orders Placed This Encounter  Procedures   CBC with Differential (Cancer Center Only)    Standing Status:   Future    Expiration Date:   07/08/2025   Iron and Iron Binding Capacity ([facility] only)    Standing Status:   Future    Expiration Date:   07/08/2025   Folate    Standing Status:   Future    Expiration Date:   07/08/2025   Ferritin    Standing Status:   Future    Expiration Date:   07/08/2025   Vitamin B12    Standing Status:   Future    Expiration Date:   07/08/2025   Methylmalonic acid, serum    Standing Status:   Future    Expiration Date:   08/07/2024   All questions were answered. The patient knows to call the clinic with any problems, questions or concerns.  Casey Casey Chihuahua, MD 1/29/20261:23 PM   CHIEF COMPLAINTS/PURPOSE OF CONSULTATION:  Anemia  HISTORY OF PRESENTING ILLNESS:  Casey Warren 64 y.o. male is here because of anemia.  Ferritin was 434 in Oct 2025. Hgb 12.9 and MCV 90  No trouble urinating, hematuria or other bleeding.  Report some loss of appetite. No bloody stool or dark stool. No stomach pain, nausea, vomiting.  His last colonoscopy was 2025 report of polyp,  diverticulosis, hemorrhoids.  He had no prior history or diagnosis of cancer.  He denies blood donation or received blood transfusion The patient was prescribed oral iron supplements and he takes once daily for about 2 months.  Denies smoking or drinking.   MEDICAL HISTORY:  Past Medical History:  Diagnosis Date   Angio-edema    Anxiety    related to surgery   Asthma    GERD (gastroesophageal reflux disease)    History of blood transfusion    HTN (hypertension)    Seizures (HCC)    last seizure in 2023 per patient   Ulcer     SURGICAL HISTORY: Past Surgical History:  Procedure Laterality Date   CLOSED REDUCTION NASAL FRACTURE  11/19/2011   Procedure: CLOSED REDUCTION NASAL FRACTURE;  Surgeon: Casey Finer, MD;  Location: [facility] OR;  Service: ENT;  Laterality: N/A;  CLOSED REDUCTION NASAL AND NASAL SEPTAL FRACTURE WITH STABILIZATION   No surgical      SOCIAL HISTORY: Social History   Socioeconomic History   Marital status: Married    Spouse name: Not on file   Number of children: 3   Years of education: Not on file   Highest education level: Not on file  Occupational History   Not on file  Tobacco Use   Smoking status: Former  Types: Cigars, Cigarettes    Passive exposure: Never   Smokeless tobacco: Never   Tobacco comments:    2 -3 black-n-milds a day   Vaping Use   Vaping status: Never Used  Substance and Sexual Activity   Alcohol use: Not Currently   Drug use: No   Sexual activity: Not on file  Other Topics Concern   Not on file  Social History Narrative   Lives in 1 story home   12 grade education   Works as tour manager   Social Drivers of Health   Tobacco Use: Medium Risk (07/02/2024)   Patient History    Smoking Tobacco Use: Former    Smokeless Tobacco Use: Never    Passive Exposure: Never  Programmer, Applications: Not on file  Food Insecurity: No Food Insecurity (07/08/2024)   Epic    Worried About Programme Researcher, Broadcasting/film/video in  the Last Year: Never true    Ran Out of Food in the Last Year: Never true  Transportation Needs: No Transportation Needs (07/08/2024)   Epic    Lack of Transportation (Medical): No    Lack of Transportation (Non-Medical): No  Physical Activity: Not on file  Stress: Not on file  Social Connections: Moderately Integrated (03/03/2024)   Social Connection and Isolation Panel    Frequency of Communication with Friends and Family: Three times a week    Frequency of Social Gatherings with Friends and Family: Three times a week    Attends Religious Services: 1 to 4 times per year    Active Member of Clubs or Organizations: No    Attends Banker Meetings: Never    Marital Status: Married  Catering Manager Violence: Not At Risk (07/08/2024)   Epic    Fear of Current or Ex-Partner: No    Emotionally Abused: No    Physically Abused: No    Sexually Abused: No  Depression (PHQ2-9): Low Risk (07/29/2023)   Depression (PHQ2-9)    PHQ-2 Score: 0  Alcohol Screen: Not on file  Housing: Unknown (07/08/2024)   Epic    Unable to Pay for Housing in the Last Year: No    Number of Times Moved in the Last Year: Not on file    Homeless in the Last Year: No  Utilities: Not At Risk (07/08/2024)   Epic    Threatened with loss of utilities: No  Health Literacy: Not on file    FAMILY HISTORY: Family History  Problem Relation Age of Onset   Stroke Mother        Died age 12   Hypertension Mother    Diabetes Mother    Breast cancer Mother    Leukemia Father        Died age 64   Allergic rhinitis Sister    Asthma Grandson    Colon cancer Neg Hx     ALLERGIES:  is allergic to banana, citrus, chlorthalidone , and penicillins.  MEDICATIONS:  Current Outpatient Medications  Medication Sig Dispense Refill   albuterol  (VENTOLIN  HFA) 108 (90 Base) MCG/ACT inhaler Inhale 2 puffs into the lungs every 6 (six) hours as needed for wheezing or shortness of breath. 18 g 2   atenolol  (TENORMIN ) 25 MG  tablet Take 1 tablet (25 mg total) by mouth daily. 90 tablet 3   atorvastatin  (LIPITOR) 20 MG tablet TAKE 1 TABLET BY MOUTH EVERY DAY 90 tablet 1   clopidogrel  (PLAVIX ) 75 MG tablet Take 1 tablet (75 mg total) by mouth daily. 30  tablet 0   cyclobenzaprine  (FLEXERIL ) 5 MG tablet Take 1 tablet (5 mg total) by mouth 3 (three) times daily as needed for muscle spasms. 30 tablet 0   EPINEPHrine  0.3 mg/0.3 mL IJ SOAJ injection Inject 0.3 mg into the muscle as needed for anaphylaxis. 1 each 2   escitalopram  (LEXAPRO ) 20 MG tablet Take 1 tablet (20 mg total) by mouth daily. Needs office visit for additional refills. 30 tablet 0   feeding supplement (ENSURE PLUS HIGH PROTEIN) LIQD Take 237 mLs by mouth 2 (two) times daily between meals. 10000 mL 0   ferrous sulfate 325 (65 FE) MG EC tablet Take 1 tablet by mouth daily.     folic acid  (FOLVITE ) 1 MG tablet Take 1 tablet (1 mg total) by mouth daily. 30 tablet 0   loperamide  (IMODIUM ) 2 MG capsule Take 1 capsule (2 mg total) by mouth as needed for diarrhea or loose stools. 30 capsule 0   loratadine  (CLARITIN ) 10 MG tablet TAKE 1 TABLET BY MOUTH EVERY DAY AS NEEDED FOR ALLERGY 90 tablet 1   methimazole  (TAPAZOLE ) 10 MG tablet Take 3 tablets (30 mg total) by mouth daily. 270 tablet 3   nicotine  (NICODERM CQ  - DOSED IN MG/24 HOURS) 21 mg/24hr patch Place 1 patch (21 mg total) onto the skin daily. 28 patch 0   sodium bicarbonate  650 MG tablet Take 1 tablet (650 mg total) by mouth 2 (two) times daily. 60 tablet 0   topiramate  (TOPAMAX ) 100 MG tablet Take 100 mg by mouth 2 (two) times daily.     magnesium  oxide (MAG-OX) 400 MG tablet Take 1 tablet (400 mg total) by mouth 2 (two) times daily. (Patient not taking: Reported on 07/08/2024) 60 tablet 0   Multiple Vitamin (MULTIVITAMIN WITH MINERALS) TABS tablet Take 1 tablet by mouth daily. (Patient not taking: Reported on 07/08/2024) 30 tablet 0   thiamine  (VITAMIN B-1) 100 MG tablet Take 2 tablets (200 mg total) by mouth  daily. (Patient not taking: Reported on 07/08/2024) 30 tablet 0   No current facility-administered medications for this visit.    REVIEW OF SYSTEMS:   All relevant systems were reviewed with the patient and are negative.  PHYSICAL EXAMINATION: ECOG PERFORMANCE STATUS: 1  Vitals:   07/08/24 1228  BP: 104/81  Pulse: 92  Resp: 18  Temp: (!) 97.3 F (36.3 C)  SpO2: 98%   Filed Weights   07/08/24 1228  Weight: 139 lb (63 kg)    GENERAL: alert, no distress and comfortable EYES: conjunctiva, sclera clear LUNGS: normal breathing effort LYMPH: no cervical lymphadenopathy ABDOMEN: abdomen soft, non-tender and nondistended   New labs ordered. Previous relevant lab results reviewed.

## 2024-07-07 NOTE — Assessment & Plan Note (Addendum)
 Report history of IDA on iron Repeat lab today Also obtain B12, MMA and folate If stable, repeat and return in about six months

## 2024-07-07 NOTE — Progress Notes (Unsigned)
 " OUTPATIENT PHYSICAL THERAPY TREATMENT NOTE    Patient Name: Casey Warren MRN: [id number] DOB:[birth year], 64 y.o., male Today's Date: 07/07/2024  END OF SESSION:    Past Medical History:  Diagnosis Date   Angio-edema    Anxiety    related to surgery   Asthma    GERD (gastroesophageal reflux disease)    History of blood transfusion    HTN (hypertension)    Seizures (HCC)    last seizure in 2023 per patient   Ulcer    Past Surgical History:  Procedure Laterality Date   CLOSED REDUCTION NASAL FRACTURE  11/19/2011   Procedure: CLOSED REDUCTION NASAL FRACTURE;  Surgeon: Marlyce Finer, MD;  Location: [facility] OR;  Service: ENT;  Laterality: N/A;  CLOSED REDUCTION NASAL AND NASAL SEPTAL FRACTURE WITH STABILIZATION   No surgical     Patient Active Problem List   Diagnosis Date Noted   Syncope and collapse 06/20/2024   Protein-calorie malnutrition, severe 03/08/2024   Cerebrovascular accident (CVA) (HCC) 03/08/2024   Malnutrition of moderate degree 03/07/2024   Graves disease 03/04/2024   Hypotension 03/02/2024   Low back pain 05/20/2023   Angioedema 07/01/2022   Anxiety 07/01/2022   Hyponatremia 11/30/2019   AKI (acute kidney injury) 11/30/2019   Hypotension due to medication 11/30/2019   Seizures (HCC) 11/30/2019   Essential hypertension 04/06/2017   ANEMIA 04/28/2008   Alcohol abuse 04/28/2008   TOBACCO ABUSE 04/28/2008   SUBSTANCE ABUSE, MULTIPLE 04/28/2008   Dental caries 04/28/2008   GERD 04/28/2008   PEPTIC ULCER DISEASE, HELICOBACTER PYLORI POSITIVE 04/28/2008    PCP: Rosalea Rosina SAILOR, PA   REFERRING PROVIDER: Irving Delinda ORN, MD  REFERRING DIAG: [phone] (ICD-10-CM) - Radiculopathy, cervical region, L adhesive capsulitis  THERAPY DIAG:  No diagnosis found.  Rationale for Evaluation and Treatment: Rehabilitation  ONSET DATE: 03/29/24  SUBJECTIVE:                                                                                                                                                                                                          SUBJECTIVE STATEMENT: Patient reports that both shoulders are hurting today, has done some of his HEP.  EVAL: Patient arrives to OPPT with c/o L shoulder pain off/on over 1 year Hand dominance: Right  PERTINENT HISTORY:    PAIN:  Are you having pain? Yes: NPRS scale: 9/10 Pain location: L shoulder and neck Pain description: ache, sore Aggravating factors: activity and using L arm Relieving factors: medicine  PRECAUTIONS: None  RED FLAGS: None  WEIGHT BEARING RESTRICTIONS: No  FALLS:  Has patient fallen in last 6 months? No  OCCUPATION: not working  PLOF: Independent  PATIENT GOALS: To manage my neck and shoulder pain  NEXT MD VISIT: TBD  OBJECTIVE:  Note: Objective measures were completed at Evaluation unless otherwise noted.  DIAGNOSTIC FINDINGS:  None noted  PATIENT SURVEYS:  NDI: 35/50  Minimum Detectable Change (90% confidence): 5 points or 10% points  POSTURE: rounded shoulders and forward head  PALPATION: deferred   CERVICAL ROM:   Active ROM A/PROM (deg) eval  Flexion 75%  Extension 50%  Right lateral flexion 90%  Left lateral flexion 100%  Right rotation 66%  Left rotation 66%   (Blank rows = not tested)  UPPER EXTREMITY ROM:  A/PROM Right eval Left eval  Shoulder flexion 100d 100/110d  Shoulder extension    Shoulder abduction 160d 60/90d  Shoulder adduction    Shoulder extension    Shoulder internal rotation  L SI joint/70d  Shoulder external rotation  /40d  Elbow flexion    Elbow extension    Wrist flexion    Wrist extension    Wrist ulnar deviation    Wrist radial deviation    Wrist pronation    Wrist supination     (Blank rows = not tested)  UPPER EXTREMITY MMT:  MMT Right eval Left eval  Shoulder flexion  3  Shoulder extension    Shoulder abduction  3  Shoulder adduction    Shoulder extension    Shoulder internal  rotation  3  Shoulder external rotation  3  Middle trapezius    Lower trapezius    Elbow flexion    Elbow extension    Wrist flexion    Wrist extension    Wrist ulnar deviation    Wrist radial deviation    Wrist pronation    Wrist supination    Grip strength     (Blank rows = not tested)  CERVICAL SPECIAL TESTS:  Neck flexor muscle endurance test: UTA, Upper limb tension test (ULTT): UTA, and Spurling's test: Negative  FUNCTIONAL TESTS:  deferred  TREATMENT:       [facility] Adult PT Treatment:                                                DATE: 07/02/24 Therapeutic Exercise: Nustep L2 8 min Therapeutic Activity: PROM L shoulder PNF D1 F/E 15x Supine press 0# 15x Supine OH flexion 0# 15x  Seated hor abd YTB 15x Seated row YTB 15x Seated OH press 15x   [facility] Adult PT Treatment:                                                DATE: 07/01/24 Therapeutic Exercise: Pulleys flex/scap x2' ea Supine shoulder flexion with dowel x1 (sharp pain in left elbow) Supine chest press with dowel 2x10 Supine cervical AROM rotation, flex/ext x10 ea Supine ER AAROM with dowel 2x10 Seated upper trap stretch 2x30 BIL Neuromuscular re-ed: Seated scapular retraction x10 Seated shoulder shrugs x10     [facility] Adult PT Treatment:  DATE: 06/21/24 Eval and HEP Self Care: Additional minutes spent for educating on updated Therapeutic Home Exercise Program as well as comparing current status to condition at start of symptoms. This included exercises focusing on stretching, strengthening, with focus on eccentric aspects. Long term goals include an improvement in range of motion, strength, endurance as well as avoiding reinjury. Patient's frequency would include in 1-2 times a day, 3-5 times a week for a duration of 6-12 weeks. Proper technique shown and discussed handout in great detail. All questions were discussed and addressed.                                                                                                                             PATIENT EDUCATION:  Education details: Discussed eval findings, rehab rationale and POC and patient is in agreement  Person educated: Patient Education method: Explanation and Handouts Education comprehension: verbalized understanding and needs further education  HOME EXERCISE PROGRAM: Access Code: [email or web address] URL: [email or web address] Date: 06/21/2024 Prepared by: Reyes Kohut  Exercises - Supine Shoulder Press with Dowel  - 5 x daily - 5 x weekly - 1 sets - 10 reps - Supine Shoulder Flexion Extension AAROM with Dowel  - 5 x daily - 5 x weekly - 1 sets - 10 reps - Seated Shoulder Shrugs  - 5 x daily - 5 x weekly - 1 sets - 10 reps - Seated Scapular Retraction  - 5 x daily - 5 x weekly - 1 sets - 10 reps  ASSESSMENT:  CLINICAL IMPRESSION: Focus of today was continued ROM exercises and activities to promote L shoulder mobility.  Assisted patient with tasks today to ensure proper form and promote mobility.  Increased ROM noted but not formally assessed today.  EVAL: Patient is a 64 y.o. male who was seen today for physical therapy evaluation and treatment for cervical radiculopathy and L shoulder adhesive capsulitis.  Cervical ROM mildly restricted globally and essentially pain free.  L shoulder ROM and strength limited by pain and guarding, restrictions following a capsular pattern.  All motions performed slowly and cautiously with slight processing delay noted.  Spouse assisted with assessment.  Patient is a good candidate for OPPT to address ROM, strength and function deficits from adhesive capsulitis and associated cervical issues.  OBJECTIVE IMPAIRMENTS: decreased activity tolerance, decreased coordination, decreased knowledge of condition, decreased mobility, decreased ROM, decreased strength, increased fascial restrictions, impaired perceived functional ability, impaired UE  functional use, postural dysfunction, pain, and muscle guarding.   ACTIVITY LIMITATIONS: carrying, lifting, sleeping, dressing, and reach over head  PERSONAL FACTORS: Age, Behavior pattern, Fitness, Past/current experiences, Time since onset of injury/illness/exacerbation, and 1 comorbidity: CVA are also affecting patient's functional outcome.   REHAB POTENTIAL: Good  CLINICAL DECISION MAKING: Evolving/moderate complexity  EVALUATION COMPLEXITY: Moderate   GOALS: Goals reviewed with patient? No  SHORT TERM GOALS: Target date: 07/19/2024    Patient to demonstrate independence in HEP  Baseline: [id number] Goal status: INITIAL  2.  Increase AROM L shoulder flexion and abd to 160d Baseline:  A/PROM Right eval Left eval  Shoulder flexion 100d 100/110d  Shoulder extension    Shoulder abduction 160d 60/90d   Goal status: INITIAL   LONG TERM GOALS: Target date: 08/16/2024  Patient will acknowledge 4/10 pain at least once during episode of care   Baseline: 9/10 Goal status: INITIAL  2.  Patient will score at least 25/50 on NDI to signify clinically meaningful improvement in functional abilities.   Baseline: 35/50 Goal status: INITIAL  3.  4/5 L shoulder strength  Baseline: 3/5 global Goal status: INITIAL  4.  70d ER PROM Baseline: see ROM chart Goal status: INITIAL  5.  Patient to demo neutral posture Baseline: rounded shoulders and forward head Goal status: INITIAL     PLAN:  PT FREQUENCY: 2x/week  PT DURATION: 6 weeks  PLANNED INTERVENTIONS: 97110-Therapeutic exercises, 97530- Therapeutic activity, 97112- Neuromuscular re-education, 97535- Self Care, [id number]- Manual therapy, Patient/Family education, and Joint mobilization  PLAN FOR NEXT SESSION: HEP review and update, manual techniques as appropriate, aerobic tasks, ROM and flexibility activities, strengthening and PREs, TPDN, gait and balance training,aquatic therapy, modalities for pain and NMRE       [name], PT 07/07/2024, 11:48 AM   "

## 2024-07-08 ENCOUNTER — Inpatient Hospital Stay

## 2024-07-08 VITALS — BP 104/81 | HR 92 | Temp 97.3°F | Resp 18 | Ht 64.0 in | Wt 139.0 lb

## 2024-07-08 DIAGNOSIS — Z803 Family history of malignant neoplasm of breast: Secondary | ICD-10-CM | POA: Diagnosis not present

## 2024-07-08 DIAGNOSIS — Z806 Family history of leukemia: Secondary | ICD-10-CM

## 2024-07-08 DIAGNOSIS — D649 Anemia, unspecified: Secondary | ICD-10-CM

## 2024-07-08 DIAGNOSIS — Z87891 Personal history of nicotine dependence: Secondary | ICD-10-CM | POA: Diagnosis not present

## 2024-07-08 LAB — CBC WITH DIFFERENTIAL (CANCER CENTER ONLY)
Abs Immature Granulocytes: 0.06 10*3/uL (ref 0.00–0.07)
Basophils Absolute: 0 10*3/uL (ref 0.0–0.1)
Basophils Relative: 0 %
Eosinophils Absolute: 0.2 10*3/uL (ref 0.0–0.5)
Eosinophils Relative: 2 %
HCT: 45.6 % (ref 39.0–52.0)
Hemoglobin: 17.1 g/dL — ABNORMAL HIGH (ref 13.0–17.0)
Immature Granulocytes: 1 %
Lymphocytes Relative: 27 %
Lymphs Abs: 2.3 10*3/uL (ref 0.7–4.0)
MCH: 30.4 pg (ref 26.0–34.0)
MCHC: 37.5 g/dL — ABNORMAL HIGH (ref 30.0–36.0)
MCV: 81 fL (ref 80.0–100.0)
Monocytes Absolute: 0.9 10*3/uL (ref 0.1–1.0)
Monocytes Relative: 10 %
Neutro Abs: 5.2 10*3/uL (ref 1.7–7.7)
Neutrophils Relative %: 60 %
Platelet Count: 374 10*3/uL (ref 150–400)
RBC: 5.63 MIL/uL (ref 4.22–5.81)
RDW: 12.2 % (ref 11.5–15.5)
WBC Count: 8.7 10*3/uL (ref 4.0–10.5)
nRBC: 0 % (ref 0.0–0.2)

## 2024-07-08 LAB — IRON AND IRON BINDING CAPACITY (CC-WL,HP ONLY)
Iron: 106 ug/dL (ref 45–182)
Saturation Ratios: 41 % — ABNORMAL HIGH (ref 17.9–39.5)
TIBC: 259 ug/dL (ref 250–450)
UIBC: 153 ug/dL

## 2024-07-08 LAB — VITAMIN B12: Vitamin B-12: 419 pg/mL (ref 180–914)

## 2024-07-08 LAB — FERRITIN: Ferritin: 1045 ng/mL — ABNORMAL HIGH (ref 24–336)

## 2024-07-08 LAB — FOLATE: Folate: 7.8 ng/mL

## 2024-07-09 ENCOUNTER — Ambulatory Visit: Payer: Self-pay

## 2024-07-09 NOTE — Progress Notes (Unsigned)
 " OUTPATIENT PHYSICAL THERAPY TREATMENT NOTE    Patient Name: Casey Warren MRN: [id number] DOB:Apr 24, 1961, 64 y.o., male Today's Date: 07/09/2024  END OF SESSION:    Past Medical History:  Diagnosis Date   Angio-edema    Anxiety    related to surgery   Asthma    GERD (gastroesophageal reflux disease)    History of blood transfusion    HTN (hypertension)    Seizures (HCC)    last seizure in 2023 per patient   Ulcer    Past Surgical History:  Procedure Laterality Date   CLOSED REDUCTION NASAL FRACTURE  11/19/2011   Procedure: CLOSED REDUCTION NASAL FRACTURE;  Surgeon: Marlyce Finer, MD;  Location: [facility] OR;  Service: ENT;  Laterality: N/A;  CLOSED REDUCTION NASAL AND NASAL SEPTAL FRACTURE WITH STABILIZATION   No surgical     Patient Active Problem List   Diagnosis Date Noted   Syncope and collapse 06/20/2024   Protein-calorie malnutrition, severe 03/08/2024   Cerebrovascular accident (CVA) (HCC) 03/08/2024   Malnutrition of moderate degree 03/07/2024   Graves disease 03/04/2024   Hypotension 03/02/2024   Low back pain 05/20/2023   Angioedema 07/01/2022   Anxiety 07/01/2022   Hyponatremia 11/30/2019   AKI (acute kidney injury) 11/30/2019   Hypotension due to medication 11/30/2019   Seizures (HCC) 11/30/2019   Essential hypertension 04/06/2017   Normocytic anemia 04/28/2008   Alcohol abuse 04/28/2008   TOBACCO ABUSE 04/28/2008   SUBSTANCE ABUSE, MULTIPLE 04/28/2008   Dental caries 04/28/2008   GERD 04/28/2008   PEPTIC ULCER DISEASE, HELICOBACTER PYLORI POSITIVE 04/28/2008    PCP: Rosalea Rosina SAILOR, PA   REFERRING PROVIDER: Irving Delinda ORN, MD  REFERRING DIAG: [phone] (ICD-10-CM) - Radiculopathy, cervical region, L adhesive capsulitis  THERAPY DIAG:  No diagnosis found.  Rationale for Evaluation and Treatment: Rehabilitation  ONSET DATE: 03/29/24  SUBJECTIVE:                                                                                                                                                                                                          SUBJECTIVE STATEMENT: Patient reports that both shoulders are hurting today, has done some of his HEP.  EVAL: Patient arrives to OPPT with c/o L shoulder pain off/on over 1 year Hand dominance: Right  PERTINENT HISTORY:    PAIN:  Are you having pain? Yes: NPRS scale: 9/10 Pain location: L shoulder and neck Pain description: ache, sore Aggravating factors: activity and using L arm Relieving factors: medicine  PRECAUTIONS: None  RED FLAGS: None  WEIGHT BEARING RESTRICTIONS: No  FALLS:  Has patient fallen in last 6 months? No  OCCUPATION: not working  PLOF: Independent  PATIENT GOALS: To manage my neck and shoulder pain  NEXT MD VISIT: TBD  OBJECTIVE:  Note: Objective measures were completed at Evaluation unless otherwise noted.  DIAGNOSTIC FINDINGS:  None noted  PATIENT SURVEYS:  NDI: 35/50  Minimum Detectable Change (90% confidence): 5 points or 10% points  POSTURE: rounded shoulders and forward head  PALPATION: deferred   CERVICAL ROM:   Active ROM A/PROM (deg) eval  Flexion 75%  Extension 50%  Right lateral flexion 90%  Left lateral flexion 100%  Right rotation 66%  Left rotation 66%   (Blank rows = not tested)  UPPER EXTREMITY ROM:  A/PROM Right eval Left eval  Shoulder flexion 100d 100/110d  Shoulder extension    Shoulder abduction 160d 60/90d  Shoulder adduction    Shoulder extension    Shoulder internal rotation  L SI joint/70d  Shoulder external rotation  /40d  Elbow flexion    Elbow extension    Wrist flexion    Wrist extension    Wrist ulnar deviation    Wrist radial deviation    Wrist pronation    Wrist supination     (Blank rows = not tested)  UPPER EXTREMITY MMT:  MMT Right eval Left eval  Shoulder flexion  3  Shoulder extension    Shoulder abduction  3  Shoulder adduction    Shoulder extension    Shoulder  internal rotation  3  Shoulder external rotation  3  Middle trapezius    Lower trapezius    Elbow flexion    Elbow extension    Wrist flexion    Wrist extension    Wrist ulnar deviation    Wrist radial deviation    Wrist pronation    Wrist supination    Grip strength     (Blank rows = not tested)  CERVICAL SPECIAL TESTS:  Neck flexor muscle endurance test: UTA, Upper limb tension test (ULTT): UTA, and Spurling's test: Negative  FUNCTIONAL TESTS:  deferred  TREATMENT:       [facility] Adult PT Treatment:                                                DATE: 07/02/24 Therapeutic Exercise: Nustep L2 8 min Therapeutic Activity: PROM L shoulder PNF D1 F/E 15x Supine press 0# 15x Supine OH flexion 0# 15x  Seated hor abd YTB 15x Seated row YTB 15x Seated OH press 15x   [facility] Adult PT Treatment:                                                DATE: 07/01/24 Therapeutic Exercise: Pulleys flex/scap x2' ea Supine shoulder flexion with dowel x1 (sharp pain in left elbow) Supine chest press with dowel 2x10 Supine cervical AROM rotation, flex/ext x10 ea Supine ER AAROM with dowel 2x10 Seated upper trap stretch 2x30 BIL Neuromuscular re-ed: Seated scapular retraction x10 Seated shoulder shrugs x10     [facility] Adult PT Treatment:  DATE: 06/21/24 Eval and HEP Self Care: Additional minutes spent for educating on updated Therapeutic Home Exercise Program as well as comparing current status to condition at start of symptoms. This included exercises focusing on stretching, strengthening, with focus on eccentric aspects. Long term goals include an improvement in range of motion, strength, endurance as well as avoiding reinjury. Patient's frequency would include in 1-2 times a day, 3-5 times a week for a duration of 6-12 weeks. Proper technique shown and discussed handout in great detail. All questions were discussed and addressed.                                                                                                                             PATIENT EDUCATION:  Education details: Discussed eval findings, rehab rationale and POC and patient is in agreement  Person educated: Patient Education method: Explanation and Handouts Education comprehension: verbalized understanding and needs further education  HOME EXERCISE PROGRAM: Access Code: [email or web address] URL: [email or web address] Date: 06/21/2024 Prepared by: Reyes Kohut  Exercises - Supine Shoulder Press with Dowel  - 5 x daily - 5 x weekly - 1 sets - 10 reps - Supine Shoulder Flexion Extension AAROM with Dowel  - 5 x daily - 5 x weekly - 1 sets - 10 reps - Seated Shoulder Shrugs  - 5 x daily - 5 x weekly - 1 sets - 10 reps - Seated Scapular Retraction  - 5 x daily - 5 x weekly - 1 sets - 10 reps  ASSESSMENT:  CLINICAL IMPRESSION: Focus of today was continued ROM exercises and activities to promote L shoulder mobility.  Assisted patient with tasks today to ensure proper form and promote mobility.  Increased ROM noted but not formally assessed today.  EVAL: Patient is a 64 y.o. male who was seen today for physical therapy evaluation and treatment for cervical radiculopathy and L shoulder adhesive capsulitis.  Cervical ROM mildly restricted globally and essentially pain free.  L shoulder ROM and strength limited by pain and guarding, restrictions following a capsular pattern.  All motions performed slowly and cautiously with slight processing delay noted.  Spouse assisted with assessment.  Patient is a good candidate for OPPT to address ROM, strength and function deficits from adhesive capsulitis and associated cervical issues.  OBJECTIVE IMPAIRMENTS: decreased activity tolerance, decreased coordination, decreased knowledge of condition, decreased mobility, decreased ROM, decreased strength, increased fascial restrictions, impaired perceived functional ability, impaired UE  functional use, postural dysfunction, pain, and muscle guarding.   ACTIVITY LIMITATIONS: carrying, lifting, sleeping, dressing, and reach over head  PERSONAL FACTORS: Age, Behavior pattern, Fitness, Past/current experiences, Time since onset of injury/illness/exacerbation, and 1 comorbidity: CVA are also affecting patient's functional outcome.   REHAB POTENTIAL: Good  CLINICAL DECISION MAKING: Evolving/moderate complexity  EVALUATION COMPLEXITY: Moderate   GOALS: Goals reviewed with patient? No  SHORT TERM GOALS: Target date: 07/19/2024    Patient to demonstrate independence in HEP  Baseline: [id number] Goal status: INITIAL  2.  Increase AROM L shoulder flexion and abd to 160d Baseline:  A/PROM Right eval Left eval  Shoulder flexion 100d 100/110d  Shoulder extension    Shoulder abduction 160d 60/90d   Goal status: INITIAL   LONG TERM GOALS: Target date: 08/16/2024  Patient will acknowledge 4/10 pain at least once during episode of care   Baseline: 9/10 Goal status: INITIAL  2.  Patient will score at least 25/50 on NDI to signify clinically meaningful improvement in functional abilities.   Baseline: 35/50 Goal status: INITIAL  3.  4/5 L shoulder strength  Baseline: 3/5 global Goal status: INITIAL  4.  70d ER PROM Baseline: see ROM chart Goal status: INITIAL  5.  Patient to demo neutral posture Baseline: rounded shoulders and forward head Goal status: INITIAL     PLAN:  PT FREQUENCY: 2x/week  PT DURATION: 6 weeks  PLANNED INTERVENTIONS: 97110-Therapeutic exercises, 97530- Therapeutic activity, 97112- Neuromuscular re-education, 97535- Self Care, [id number]- Manual therapy, Patient/Family education, and Joint mobilization  PLAN FOR NEXT SESSION: HEP review and update, manual techniques as appropriate, aerobic tasks, ROM and flexibility activities, strengthening and PREs, TPDN, gait and balance training,aquatic therapy, modalities for pain and NMRE       [name], PT 07/09/2024, 11:06 AM   "

## 2024-07-12 ENCOUNTER — Ambulatory Visit

## 2024-07-12 LAB — METHYLMALONIC ACID, SERUM: Methylmalonic Acid, Quantitative: 357 nmol/L (ref 0–378)

## 2024-07-14 ENCOUNTER — Ambulatory Visit: Admitting: Podiatry

## 2024-07-14 ENCOUNTER — Encounter: Payer: Self-pay | Admitting: Podiatry

## 2024-07-14 DIAGNOSIS — B351 Tinea unguium: Secondary | ICD-10-CM

## 2024-07-14 NOTE — Progress Notes (Signed)
 This patient presents to the office with chief complaint of long thick painful nails.  Patient says the nails are painful walking and wearing shoes.  This patient is unable to self treat.  This patient is unable to trim his nails since he is unable to reach his  nails.  he presents to the office for preventative foot care services.  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  General Appearance  Alert, conversant and in no acute stress.  Vascular  Dorsalis pedis and posterior tibial  pulses are palpable  bilaterally.  Capillary return is within normal limits  bilaterally. Temperature is within normal limits  bilaterally.  Neurologic  Senn-Weinstein monofilament wire test within normal limits  bilaterally. Muscle power within normal limits bilaterally.  Nails Thick disfigured discolored nails with subungual debris  from hallux to fifth toes bilaterally. No evidence of bacterial infection or drainage bilaterally.  Orthopedic  No limitations of motion  feet .  No crepitus or effusions noted.  No bony pathology or digital deformities noted.  Skin  normotropic skin with no porokeratosis noted bilaterally.  No signs of infections or ulcers noted.     Onychomycosis  Nails  B/L.  Pain in right toes  Pain in left toes  Debridement of nails both feet followed trimming the nails with dremel tool.    RTC 3 months.   Cordella Bold DPM

## 2024-07-15 ENCOUNTER — Ambulatory Visit

## 2024-07-15 DIAGNOSIS — R269 Unspecified abnormalities of gait and mobility: Secondary | ICD-10-CM

## 2024-07-15 DIAGNOSIS — M7502 Adhesive capsulitis of left shoulder: Secondary | ICD-10-CM

## 2024-07-15 DIAGNOSIS — M5412 Radiculopathy, cervical region: Secondary | ICD-10-CM

## 2024-07-15 DIAGNOSIS — R293 Abnormal posture: Secondary | ICD-10-CM

## 2024-07-15 NOTE — Therapy (Addendum)
 " OUTPATIENT PHYSICAL THERAPY TREATMENT NOTE    Patient Name: Casey Warren MRN: [id number] DOB:02/04/1961, 64 y.o., male Today's Date: 07/15/2024    PT End of Session - 07/15/24 1542     Visit Number 4    Number of Visits 12    Date for Recertification  08/19/24    Authorization Type [employer]    Authorization Time Period no auth required, medical necessity    PT Start Time 1450    PT Stop Time 1528    PT Time Calculation (min) 38 min    Activity Tolerance Patient tolerated treatment well;Patient limited by pain    Behavior During Therapy WFL for tasks assessed/performed             Past Medical History:  Diagnosis Date   Angio-edema    Anxiety    related to surgery   Asthma    GERD (gastroesophageal reflux disease)    History of blood transfusion    HTN (hypertension)    Seizures (HCC)    last seizure in 2023 per patient   Ulcer    Past Surgical History:  Procedure Laterality Date   CLOSED REDUCTION NASAL FRACTURE  11/19/2011   Procedure: CLOSED REDUCTION NASAL FRACTURE;  Surgeon: Marlyce Finer, MD;  Location: [facility] OR;  Service: ENT;  Laterality: N/A;  CLOSED REDUCTION NASAL AND NASAL SEPTAL FRACTURE WITH STABILIZATION   No surgical     Patient Active Problem List   Diagnosis Date Noted   Syncope and collapse 06/20/2024   Protein-calorie malnutrition, severe 03/08/2024   Cerebrovascular accident (CVA) (HCC) 03/08/2024   Malnutrition of moderate degree 03/07/2024   Graves disease 03/04/2024   Hypotension 03/02/2024   Low back pain 05/20/2023   Angioedema 07/01/2022   Anxiety 07/01/2022   Hyponatremia 11/30/2019   AKI (acute kidney injury) 11/30/2019   Hypotension due to medication 11/30/2019   Seizures (HCC) 11/30/2019   Essential hypertension 04/06/2017   ANEMIA 04/28/2008   Alcohol abuse 04/28/2008   TOBACCO ABUSE 04/28/2008   SUBSTANCE ABUSE, MULTIPLE 04/28/2008   Dental caries 04/28/2008   GERD 04/28/2008   PEPTIC ULCER DISEASE, HELICOBACTER  PYLORI POSITIVE 04/28/2008    PCP: Rosalea Rosina SAILOR, PA   REFERRING PROVIDER: Irving Delinda ORN, MD  REFERRING DIAG: [phone] (ICD-10-CM) - Radiculopathy, cervical region, L adhesive capsulitis  THERAPY DIAG:  Radiculopathy, cervical region  Adhesive capsulitis of left shoulder  Abnormal posture  Rationale for Evaluation and Treatment: Rehabilitation  ONSET DATE: 03/29/24  SUBJECTIVE:  SUBJECTIVE STATEMENT: Patient reports pain of L shoulder today. Notes he is tired today.   EVAL: Patient arrives to OPPT with c/o L shoulder pain off/on over 1 year Hand dominance: Right  PERTINENT HISTORY:    PAIN:  Are you having pain? Yes: NPRS scale: 9/10 Pain location: L shoulder and neck Pain description: ache, sore Aggravating factors: activity and using L arm Relieving factors: medicine  PRECAUTIONS: None  RED FLAGS: None     WEIGHT BEARING RESTRICTIONS: No  FALLS:  Has patient fallen in last 6 months? No  OCCUPATION: not working  PLOF: Independent  PATIENT GOALS: To manage my neck and shoulder pain  NEXT MD VISIT: TBD  OBJECTIVE:  Note: Objective measures were completed at Evaluation unless otherwise noted.  DIAGNOSTIC FINDINGS:  None noted  PATIENT SURVEYS:  NDI: 35/50  Minimum Detectable Change (90% confidence): 5 points or 10% points  POSTURE: rounded shoulders and forward head  PALPATION: deferred   CERVICAL ROM:   Active ROM A/PROM (deg) eval  Flexion 75%  Extension 50%  Right lateral flexion 90%  Left lateral flexion 100%  Right rotation 66%  Left rotation 66%   (Blank rows = not tested)  UPPER EXTREMITY ROM:  A/PROM Right eval Left eval  Shoulder flexion 100d 100/110d  Shoulder extension    Shoulder abduction 160d 60/90d  Shoulder  adduction    Shoulder extension    Shoulder internal rotation  L SI joint/70d  Shoulder external rotation  /40d  Elbow flexion    Elbow extension    Wrist flexion    Wrist extension    Wrist ulnar deviation    Wrist radial deviation    Wrist pronation    Wrist supination     (Blank rows = not tested)  UPPER EXTREMITY MMT:  MMT Right eval Left eval  Shoulder flexion  3  Shoulder extension    Shoulder abduction  3  Shoulder adduction    Shoulder extension    Shoulder internal rotation  3  Shoulder external rotation  3  Middle trapezius    Lower trapezius    Elbow flexion    Elbow extension    Wrist flexion    Wrist extension    Wrist ulnar deviation    Wrist radial deviation    Wrist pronation    Wrist supination    Grip strength     (Blank rows = not tested)  CERVICAL SPECIAL TESTS:  Neck flexor muscle endurance test: UTA, Upper limb tension test (ULTT): UTA, and Spurling's test: Negative  FUNCTIONAL TESTS:  deferred  TREATMENT:      [facility] Adult PT Treatment:                                                DATE: 07/15/2024    Therapeutic Exercise:  UBE 4' fwd/back Upper trap stretch - L only - 2x30 hold Levator scap stretch - L only - 2x30 hold Neuro Re-Ed:  Dowel flexion supine x 20 and seated x 20  Dowel press supine x 20  Horizontal ABD YTB supine x 20  B ER YTB supine x 20 and seated x 20  SA seated row YTB x 10 B   [facility] Adult PT Treatment:  DATE: 07/02/24 Therapeutic Exercise: Nustep L2 8 min Therapeutic Activity: PROM L shoulder PNF D1 F/E 15x Supine press 0# 15x Supine OH flexion 0# 15x  Seated hor abd YTB 15x Seated row YTB 15x Seated OH press 15x   [facility] Adult PT Treatment:                                                DATE: 07/01/24 Therapeutic Exercise: Pulleys flex/scap x2' ea Supine shoulder flexion with dowel x1 (sharp pain in left elbow) Supine chest press with dowel 2x10 Supine  cervical AROM rotation, flex/ext x10 ea Supine ER AAROM with dowel 2x10 Seated upper trap stretch 2x30 BIL Neuromuscular re-ed: Seated scapular retraction x10 Seated shoulder shrugs x10     [facility] Adult PT Treatment:                                                DATE: 06/21/24 Eval and HEP Self Care: Additional minutes spent for educating on updated Therapeutic Home Exercise Program as well as comparing current status to condition at start of symptoms. This included exercises focusing on stretching, strengthening, with focus on eccentric aspects. Long term goals include an improvement in range of motion, strength, endurance as well as avoiding reinjury. Patient's frequency would include in 1-2 times a day, 3-5 times a week for a duration of 6-12 weeks. Proper technique shown and discussed handout in great detail. All questions were discussed and addressed.                                                                                                                            PATIENT EDUCATION:  Education details: Discussed eval findings, rehab rationale and POC and patient is in agreement  Person educated: Patient Education method: Explanation and Handouts Education comprehension: verbalized understanding and needs further education  HOME EXERCISE PROGRAM: Access Code: [email or web address] URL: [email or web address] Date: 06/21/2024 Prepared by: Reyes Kohut  Exercises - Supine Shoulder Press with Dowel  - 5 x daily - 5 x weekly - 1 sets - 10 reps - Supine Shoulder Flexion Extension AAROM with Dowel  - 5 x daily - 5 x weekly - 1 sets - 10 reps - Seated Shoulder Shrugs  - 5 x daily - 5 x weekly - 1 sets - 10 reps - Seated Scapular Retraction  - 5 x daily - 5 x weekly - 1 sets - 10 reps  ASSESSMENT:  CLINICAL IMPRESSION:  Continued with ROM and gentle strengthening today. Pt noting pain around 90 degrees of shoulder elevation when supine and seated. Able to perform  strengthening without exacerbation of symptoms. The pt will benefit from skilled physical therapy to decrease pain and increase  function.    EVAL: Patient is a 64 y.o. male who was seen today for physical therapy evaluation and treatment for cervical radiculopathy and L shoulder adhesive capsulitis.  Cervical ROM mildly restricted globally and essentially pain free.  L shoulder ROM and strength limited by pain and guarding, restrictions following a capsular pattern.  All motions performed slowly and cautiously with slight processing delay noted.  Spouse assisted with assessment.  Patient is a good candidate for OPPT to address ROM, strength and function deficits from adhesive capsulitis and associated cervical issues.  OBJECTIVE IMPAIRMENTS: decreased activity tolerance, decreased coordination, decreased knowledge of condition, decreased mobility, decreased ROM, decreased strength, increased fascial restrictions, impaired perceived functional ability, impaired UE functional use, postural dysfunction, pain, and muscle guarding.   ACTIVITY LIMITATIONS: carrying, lifting, sleeping, dressing, and reach over head  PERSONAL FACTORS: Age, Behavior pattern, Fitness, Past/current experiences, Time since onset of injury/illness/exacerbation, and 1 comorbidity: CVA are also affecting patient's functional outcome.   REHAB POTENTIAL: Good  CLINICAL DECISION MAKING: Evolving/moderate complexity  EVALUATION COMPLEXITY: Moderate   GOALS: Goals reviewed with patient? No  SHORT TERM GOALS: Target date: 07/19/2024    Patient to demonstrate independence in HEP  Baseline: [id number] Goal status: INITIAL  2.  Increase AROM L shoulder flexion and abd to 160d Baseline:  A/PROM Right eval Left eval  Shoulder flexion 100d 100/110d  Shoulder extension    Shoulder abduction 160d 60/90d   Goal status: INITIAL   LONG TERM GOALS: Target date: 08/16/2024  Patient will acknowledge 4/10 pain at least once during  episode of care   Baseline: 9/10 Goal status: INITIAL  2.  Patient will score at least 25/50 on NDI to signify clinically meaningful improvement in functional abilities.   Baseline: 35/50 Goal status: INITIAL  3.  4/5 L shoulder strength  Baseline: 3/5 global Goal status: INITIAL  4.  70d ER PROM Baseline: see ROM chart Goal status: INITIAL  5.  Patient to demo neutral posture Baseline: rounded shoulders and forward head Goal status: INITIAL     PLAN:  PT FREQUENCY: 2x/week  PT DURATION: 6 weeks  PLANNED INTERVENTIONS: 97110-Therapeutic exercises, 97530- Therapeutic activity, 97112- Neuromuscular re-education, 97535- Self Care, [id number]- Manual therapy, Patient/Family education, and Joint mobilization  PLAN FOR NEXT SESSION: HEP review and update, manual techniques as appropriate, aerobic tasks, ROM and flexibility activities, strengthening and PREs, TPDN, gait and balance training,aquatic therapy, modalities for pain and NMRE      Marijo Berber PT, DPT 07/15/2024 4:45 PM   "

## 2024-07-16 NOTE — Therapy (Unsigned)
 " OUTPATIENT PHYSICAL THERAPY TREATMENT NOTE    Patient Name: Casey Warren MRN: [id number] DOB:March 11, 1961, 64 y.o., male Today's Date: 07/16/2024         Past Medical History:  Diagnosis Date   Angio-edema    Anxiety    related to surgery   Asthma    GERD (gastroesophageal reflux disease)    History of blood transfusion    HTN (hypertension)    Seizures (HCC)    last seizure in 2023 per patient   Ulcer    Past Surgical History:  Procedure Laterality Date   CLOSED REDUCTION NASAL FRACTURE  11/19/2011   Procedure: CLOSED REDUCTION NASAL FRACTURE;  Surgeon: Marlyce Finer, MD;  Location: [facility] OR;  Service: ENT;  Laterality: N/A;  CLOSED REDUCTION NASAL AND NASAL SEPTAL FRACTURE WITH STABILIZATION   No surgical     Patient Active Problem List   Diagnosis Date Noted   Syncope and collapse 06/20/2024   Protein-calorie malnutrition, severe 03/08/2024   Cerebrovascular accident (CVA) (HCC) 03/08/2024   Malnutrition of moderate degree 03/07/2024   Graves disease 03/04/2024   Hypotension 03/02/2024   Low back pain 05/20/2023   Angioedema 07/01/2022   Anxiety 07/01/2022   Hyponatremia 11/30/2019   AKI (acute kidney injury) 11/30/2019   Hypotension due to medication 11/30/2019   Seizures (HCC) 11/30/2019   Essential hypertension 04/06/2017   ANEMIA 04/28/2008   Alcohol abuse 04/28/2008   TOBACCO ABUSE 04/28/2008   SUBSTANCE ABUSE, MULTIPLE 04/28/2008   Dental caries 04/28/2008   GERD 04/28/2008   PEPTIC ULCER DISEASE, HELICOBACTER PYLORI POSITIVE 04/28/2008    PCP: Rosalea Rosina SAILOR, PA   REFERRING PROVIDER: Irving Delinda ORN, MD  REFERRING DIAG: [phone] (ICD-10-CM) - Radiculopathy, cervical region, L adhesive capsulitis  THERAPY DIAG:  Radiculopathy, cervical region  Adhesive capsulitis of left shoulder  Abnormal posture  Rationale for Evaluation and Treatment: Rehabilitation  ONSET DATE: 03/29/24  SUBJECTIVE:                                                                                                                                                                                                          SUBJECTIVE STATEMENT: Patient reports pain of L shoulder today. Notes he is tired today.   EVAL: Patient arrives to OPPT with c/o L shoulder pain off/on over 1 year Hand dominance: Right  PERTINENT HISTORY:    PAIN:  Are you having pain? Yes: NPRS scale: 9/10 Pain location: L shoulder and neck Pain description: ache, sore Aggravating factors: activity and using L arm Relieving factors: medicine  PRECAUTIONS:  None  RED FLAGS: None     WEIGHT BEARING RESTRICTIONS: No  FALLS:  Has patient fallen in last 6 months? No  OCCUPATION: not working  PLOF: Independent  PATIENT GOALS: To manage my neck and shoulder pain  NEXT MD VISIT: TBD  OBJECTIVE:  Note: Objective measures were completed at Evaluation unless otherwise noted.  DIAGNOSTIC FINDINGS:  None noted  PATIENT SURVEYS:  NDI: 35/50  Minimum Detectable Change (90% confidence): 5 points or 10% points  POSTURE: rounded shoulders and forward head  PALPATION: deferred   CERVICAL ROM:   Active ROM A/PROM (deg) eval  Flexion 75%  Extension 50%  Right lateral flexion 90%  Left lateral flexion 100%  Right rotation 66%  Left rotation 66%   (Blank rows = not tested)  UPPER EXTREMITY ROM:  A/PROM Right eval Left eval  Shoulder flexion 100d 100/110d  Shoulder extension    Shoulder abduction 160d 60/90d  Shoulder adduction    Shoulder extension    Shoulder internal rotation  L SI joint/70d  Shoulder external rotation  /40d  Elbow flexion    Elbow extension    Wrist flexion    Wrist extension    Wrist ulnar deviation    Wrist radial deviation    Wrist pronation    Wrist supination     (Blank rows = not tested)  UPPER EXTREMITY MMT:  MMT Right eval Left eval  Shoulder flexion  3  Shoulder extension    Shoulder abduction  3  Shoulder  adduction    Shoulder extension    Shoulder internal rotation  3  Shoulder external rotation  3  Middle trapezius    Lower trapezius    Elbow flexion    Elbow extension    Wrist flexion    Wrist extension    Wrist ulnar deviation    Wrist radial deviation    Wrist pronation    Wrist supination    Grip strength     (Blank rows = not tested)  CERVICAL SPECIAL TESTS:  Neck flexor muscle endurance test: UTA, Upper limb tension test (ULTT): UTA, and Spurling's test: Negative  FUNCTIONAL TESTS:  deferred  TREATMENT:      [facility] Adult PT Treatment:                                                DATE: 07/15/2024    Therapeutic Exercise:  UBE 4' fwd/back Upper trap stretch - L only - 2x30 hold Levator scap stretch - L only - 2x30 hold Neuro Re-Ed:  Dowel flexion supine x 20 and seated x 20  Dowel press supine x 20  Horizontal ABD YTB supine x 20  B ER YTB supine x 20 and seated x 20  SA seated row YTB x 10 B   [facility] Adult PT Treatment:                                                DATE: 07/02/24 Therapeutic Exercise: Nustep L2 8 min Therapeutic Activity: PROM L shoulder PNF D1 F/E 15x Supine press 0# 15x Supine OH flexion 0# 15x  Seated hor abd YTB 15x Seated row YTB 15x Seated OH press 15x   [facility] Adult PT Treatment:  DATE: 07/01/24 Therapeutic Exercise: Pulleys flex/scap x2' ea Supine shoulder flexion with dowel x1 (sharp pain in left elbow) Supine chest press with dowel 2x10 Supine cervical AROM rotation, flex/ext x10 ea Supine ER AAROM with dowel 2x10 Seated upper trap stretch 2x30 BIL Neuromuscular re-ed: Seated scapular retraction x10 Seated shoulder shrugs x10     [facility] Adult PT Treatment:                                                DATE: 06/21/24 Eval and HEP Self Care: Additional minutes spent for educating on updated Therapeutic Home Exercise Program as well as comparing current status to condition at start  of symptoms. This included exercises focusing on stretching, strengthening, with focus on eccentric aspects. Long term goals include an improvement in range of motion, strength, endurance as well as avoiding reinjury. Patient's frequency would include in 1-2 times a day, 3-5 times a week for a duration of 6-12 weeks. Proper technique shown and discussed handout in great detail. All questions were discussed and addressed.                                                                                                                            PATIENT EDUCATION:  Education details: Discussed eval findings, rehab rationale and POC and patient is in agreement  Person educated: Patient Education method: Explanation and Handouts Education comprehension: verbalized understanding and needs further education  HOME EXERCISE PROGRAM: Access Code: [email or web address] URL: [email or web address] Date: 06/21/2024 Prepared by: Reyes Kohut  Exercises - Supine Shoulder Press with Dowel  - 5 x daily - 5 x weekly - 1 sets - 10 reps - Supine Shoulder Flexion Extension AAROM with Dowel  - 5 x daily - 5 x weekly - 1 sets - 10 reps - Seated Shoulder Shrugs  - 5 x daily - 5 x weekly - 1 sets - 10 reps - Seated Scapular Retraction  - 5 x daily - 5 x weekly - 1 sets - 10 reps  ASSESSMENT:  CLINICAL IMPRESSION:  Continued with ROM and gentle strengthening today. Pt noting pain around 90 degrees of shoulder elevation when supine and seated. Able to perform strengthening without exacerbation of symptoms. The pt will benefit from skilled physical therapy to decrease pain and increase function.    EVAL: Patient is a 64 y.o. male who was seen today for physical therapy evaluation and treatment for cervical radiculopathy and L shoulder adhesive capsulitis.  Cervical ROM mildly restricted globally and essentially pain free.  L shoulder ROM and strength limited by pain and guarding, restrictions following a capsular  pattern.  All motions performed slowly and cautiously with slight processing delay noted.  Spouse assisted with assessment.  Patient is a good candidate for OPPT to address ROM, strength and function deficits from adhesive capsulitis and associated cervical  issues.  OBJECTIVE IMPAIRMENTS: decreased activity tolerance, decreased coordination, decreased knowledge of condition, decreased mobility, decreased ROM, decreased strength, increased fascial restrictions, impaired perceived functional ability, impaired UE functional use, postural dysfunction, pain, and muscle guarding.   ACTIVITY LIMITATIONS: carrying, lifting, sleeping, dressing, and reach over head  PERSONAL FACTORS: Age, Behavior pattern, Fitness, Past/current experiences, Time since onset of injury/illness/exacerbation, and 1 comorbidity: CVA are also affecting patient's functional outcome.   REHAB POTENTIAL: Good  CLINICAL DECISION MAKING: Evolving/moderate complexity  EVALUATION COMPLEXITY: Moderate   GOALS: Goals reviewed with patient? No  SHORT TERM GOALS: Target date: 07/19/2024    Patient to demonstrate independence in HEP  Baseline: [id number] Goal status: INITIAL  2.  Increase AROM L shoulder flexion and abd to 160d Baseline:  A/PROM Right eval Left eval  Shoulder flexion 100d 100/110d  Shoulder extension    Shoulder abduction 160d 60/90d   Goal status: INITIAL   LONG TERM GOALS: Target date: 08/16/2024  Patient will acknowledge 4/10 pain at least once during episode of care   Baseline: 9/10 Goal status: INITIAL  2.  Patient will score at least 25/50 on NDI to signify clinically meaningful improvement in functional abilities.   Baseline: 35/50 Goal status: INITIAL  3.  4/5 L shoulder strength  Baseline: 3/5 global Goal status: INITIAL  4.  70d ER PROM Baseline: see ROM chart Goal status: INITIAL  5.  Patient to demo neutral posture Baseline: rounded shoulders and forward head Goal status:  INITIAL     PLAN:  PT FREQUENCY: 2x/week  PT DURATION: 6 weeks  PLANNED INTERVENTIONS: 97110-Therapeutic exercises, 97530- Therapeutic activity, 97112- Neuromuscular re-education, 97535- Self Care, [id number]- Manual therapy, Patient/Family education, and Joint mobilization  PLAN FOR NEXT SESSION: HEP review and update, manual techniques as appropriate, aerobic tasks, ROM and flexibility activities, strengthening and PREs, TPDN, gait and balance training,aquatic therapy, modalities for pain and NMRE      Marijo Berber PT, DPT 07/16/2024 11:44 AM   "

## 2024-07-19 ENCOUNTER — Ambulatory Visit

## 2024-07-19 ENCOUNTER — Ambulatory Visit: Admitting: Endocrinology

## 2024-07-22 ENCOUNTER — Ambulatory Visit

## 2024-07-29 ENCOUNTER — Inpatient Hospital Stay

## 2024-09-20 ENCOUNTER — Ambulatory Visit: Admitting: Endocrinology

## 2024-10-11 ENCOUNTER — Ambulatory Visit: Admitting: Podiatry
